# Patient Record
Sex: Female | Born: 1935
Health system: Southern US, Community
[De-identification: ages and names within clinical notes are randomized; demographics above are authoritative.]

## PROBLEM LIST (undated history)

## (undated) DIAGNOSIS — I5032 Chronic diastolic (congestive) heart failure: Secondary | ICD-10-CM

## (undated) DIAGNOSIS — K5900 Constipation, unspecified: Secondary | ICD-10-CM

## (undated) DIAGNOSIS — I2510 Atherosclerotic heart disease of native coronary artery without angina pectoris: Secondary | ICD-10-CM

## (undated) DIAGNOSIS — I509 Heart failure, unspecified: Secondary | ICD-10-CM

## (undated) DIAGNOSIS — I7121 Aneurysm of the ascending aorta, without rupture: Secondary | ICD-10-CM

## (undated) DIAGNOSIS — M1990 Unspecified osteoarthritis, unspecified site: Secondary | ICD-10-CM

## (undated) DIAGNOSIS — Z87440 Personal history of urinary (tract) infections: Secondary | ICD-10-CM

## (undated) DIAGNOSIS — R011 Cardiac murmur, unspecified: Secondary | ICD-10-CM

## (undated) DIAGNOSIS — M797 Fibromyalgia: Secondary | ICD-10-CM

## (undated) DIAGNOSIS — I10 Essential (primary) hypertension: Secondary | ICD-10-CM

## (undated) DIAGNOSIS — Z8739 Personal history of other diseases of the musculoskeletal system and connective tissue: Secondary | ICD-10-CM

## (undated) HISTORY — DX: Aneurysm of the ascending aorta, without rupture: I71.21

## (undated) HISTORY — PX: KNEE SURGERY: SHX244

## (undated) HISTORY — PX: OTHER SURGICAL HISTORY: SHX169

## (undated) HISTORY — PX: JOINT REPLACEMENT: SHX530

## (undated) HISTORY — DX: Chronic diastolic (congestive) heart failure: I50.32

## (undated) HISTORY — PX: REPLACEMENT TOTAL HIP W/  RESURFACING IMPLANTS: SUR1222

---
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
## 1961-02-08 HISTORY — PX: HERNIA REPAIR: SHX51

## 1970-02-08 HISTORY — PX: BACK SURGERY: SHX140

## 1997-06-13 ENCOUNTER — Other Ambulatory Visit: Admission: RE | Admit: 1997-06-13 | Discharge: 1997-06-13 | Payer: Self-pay | Admitting: Obstetrics and Gynecology

## 1997-11-08 ENCOUNTER — Other Ambulatory Visit: Admission: RE | Admit: 1997-11-08 | Discharge: 1997-11-08 | Payer: Self-pay | Admitting: Obstetrics and Gynecology

## 1998-04-28 ENCOUNTER — Ambulatory Visit (HOSPITAL_COMMUNITY): Admission: RE | Admit: 1998-04-28 | Discharge: 1998-04-28 | Payer: Self-pay

## 1998-05-28 ENCOUNTER — Encounter: Payer: Self-pay | Admitting: Orthopedic Surgery

## 1998-06-02 ENCOUNTER — Inpatient Hospital Stay (HOSPITAL_COMMUNITY): Admission: RE | Admit: 1998-06-02 | Discharge: 1998-06-05 | Payer: Self-pay | Admitting: Orthopedic Surgery

## 1998-06-05 ENCOUNTER — Inpatient Hospital Stay (HOSPITAL_COMMUNITY): Admission: RE | Admit: 1998-06-05 | Discharge: 1998-06-11 | Payer: Self-pay | Admitting: *Deleted

## 1998-11-10 ENCOUNTER — Other Ambulatory Visit: Admission: RE | Admit: 1998-11-10 | Discharge: 1998-11-10 | Payer: Self-pay | Admitting: Obstetrics and Gynecology

## 1999-11-10 ENCOUNTER — Other Ambulatory Visit: Admission: RE | Admit: 1999-11-10 | Discharge: 1999-11-10 | Payer: Self-pay | Admitting: Obstetrics and Gynecology

## 2000-11-18 ENCOUNTER — Other Ambulatory Visit: Admission: RE | Admit: 2000-11-18 | Discharge: 2000-11-18 | Payer: Self-pay | Admitting: Obstetrics and Gynecology

## 2001-10-17 ENCOUNTER — Encounter (INDEPENDENT_AMBULATORY_CARE_PROVIDER_SITE_OTHER): Payer: Self-pay | Admitting: Specialist

## 2001-10-17 ENCOUNTER — Ambulatory Visit (HOSPITAL_COMMUNITY): Admission: RE | Admit: 2001-10-17 | Discharge: 2001-10-17 | Payer: Self-pay | Admitting: *Deleted

## 2001-12-04 ENCOUNTER — Other Ambulatory Visit: Admission: RE | Admit: 2001-12-04 | Discharge: 2001-12-04 | Payer: Self-pay | Admitting: Obstetrics and Gynecology

## 2002-02-08 HISTORY — PX: OTHER SURGICAL HISTORY: SHX169

## 2002-04-03 ENCOUNTER — Ambulatory Visit (HOSPITAL_BASED_OUTPATIENT_CLINIC_OR_DEPARTMENT_OTHER): Admission: RE | Admit: 2002-04-03 | Discharge: 2002-04-04 | Payer: Self-pay | Admitting: Orthopedic Surgery

## 2003-09-22 ENCOUNTER — Encounter: Admission: RE | Admit: 2003-09-22 | Discharge: 2003-09-22 | Payer: Self-pay

## 2003-10-22 ENCOUNTER — Encounter: Admission: RE | Admit: 2003-10-22 | Discharge: 2003-10-22 | Payer: Self-pay

## 2003-11-05 ENCOUNTER — Encounter: Admission: RE | Admit: 2003-11-05 | Discharge: 2003-11-05 | Payer: Self-pay

## 2003-12-12 ENCOUNTER — Ambulatory Visit (HOSPITAL_COMMUNITY): Admission: RE | Admit: 2003-12-12 | Discharge: 2003-12-12 | Payer: Self-pay | Admitting: Cardiology

## 2003-12-19 ENCOUNTER — Other Ambulatory Visit: Admission: RE | Admit: 2003-12-19 | Discharge: 2003-12-19 | Payer: Self-pay | Admitting: *Deleted

## 2004-02-09 HISTORY — PX: SPINAL FUSION: SHX223

## 2004-03-23 ENCOUNTER — Ambulatory Visit: Payer: Self-pay | Admitting: Physical Medicine & Rehabilitation

## 2004-03-23 ENCOUNTER — Inpatient Hospital Stay (HOSPITAL_COMMUNITY): Admission: RE | Admit: 2004-03-23 | Discharge: 2004-03-26 | Payer: Self-pay | Admitting: Neurosurgery

## 2004-03-26 ENCOUNTER — Inpatient Hospital Stay (HOSPITAL_COMMUNITY)
Admission: RE | Admit: 2004-03-26 | Discharge: 2004-04-07 | Payer: Self-pay | Admitting: Physical Medicine & Rehabilitation

## 2004-12-23 ENCOUNTER — Other Ambulatory Visit: Admission: RE | Admit: 2004-12-23 | Discharge: 2004-12-23 | Payer: Self-pay | Admitting: Obstetrics and Gynecology

## 2005-04-20 ENCOUNTER — Encounter: Admission: RE | Admit: 2005-04-20 | Discharge: 2005-04-20 | Payer: Self-pay

## 2005-08-04 ENCOUNTER — Inpatient Hospital Stay (HOSPITAL_COMMUNITY): Admission: RE | Admit: 2005-08-04 | Discharge: 2005-08-10 | Payer: Self-pay | Admitting: Orthopedic Surgery

## 2005-08-09 ENCOUNTER — Encounter: Payer: Self-pay | Admitting: Vascular Surgery

## 2005-08-09 ENCOUNTER — Ambulatory Visit: Payer: Self-pay | Admitting: Physical Medicine & Rehabilitation

## 2005-12-24 ENCOUNTER — Other Ambulatory Visit: Admission: RE | Admit: 2005-12-24 | Discharge: 2005-12-24 | Payer: Self-pay | Admitting: Obstetrics and Gynecology

## 2006-02-01 ENCOUNTER — Ambulatory Visit (HOSPITAL_COMMUNITY): Admission: EM | Admit: 2006-02-01 | Discharge: 2006-02-01 | Payer: Self-pay | Admitting: Emergency Medicine

## 2006-02-08 HISTORY — PX: OTHER SURGICAL HISTORY: SHX169

## 2006-02-28 ENCOUNTER — Inpatient Hospital Stay (HOSPITAL_COMMUNITY): Admission: EM | Admit: 2006-02-28 | Discharge: 2006-03-01 | Payer: Self-pay | Admitting: Emergency Medicine

## 2006-06-02 ENCOUNTER — Observation Stay (HOSPITAL_COMMUNITY): Admission: EM | Admit: 2006-06-02 | Discharge: 2006-06-03 | Payer: Self-pay | Admitting: Emergency Medicine

## 2006-06-22 ENCOUNTER — Inpatient Hospital Stay (HOSPITAL_COMMUNITY): Admission: RE | Admit: 2006-06-22 | Discharge: 2006-06-25 | Payer: Self-pay | Admitting: Orthopedic Surgery

## 2007-03-08 ENCOUNTER — Other Ambulatory Visit: Admission: RE | Admit: 2007-03-08 | Discharge: 2007-03-08 | Payer: Self-pay | Admitting: Obstetrics and Gynecology

## 2007-03-15 ENCOUNTER — Emergency Department (HOSPITAL_COMMUNITY): Admission: EM | Admit: 2007-03-15 | Discharge: 2007-03-15 | Payer: Self-pay | Admitting: Emergency Medicine

## 2007-08-06 ENCOUNTER — Emergency Department (HOSPITAL_COMMUNITY): Admission: EM | Admit: 2007-08-06 | Discharge: 2007-08-06 | Payer: Self-pay | Admitting: Internal Medicine

## 2008-06-20 ENCOUNTER — Encounter (INDEPENDENT_AMBULATORY_CARE_PROVIDER_SITE_OTHER): Payer: Self-pay | Admitting: *Deleted

## 2008-06-20 ENCOUNTER — Ambulatory Visit (HOSPITAL_COMMUNITY): Admission: RE | Admit: 2008-06-20 | Discharge: 2008-06-20 | Payer: Self-pay | Admitting: *Deleted

## 2008-06-25 ENCOUNTER — Ambulatory Visit (HOSPITAL_COMMUNITY): Admission: RE | Admit: 2008-06-25 | Discharge: 2008-06-25 | Payer: Self-pay | Admitting: *Deleted

## 2008-07-11 ENCOUNTER — Emergency Department (HOSPITAL_COMMUNITY): Admission: EM | Admit: 2008-07-11 | Discharge: 2008-07-11 | Payer: Self-pay | Admitting: Emergency Medicine

## 2008-08-16 ENCOUNTER — Ambulatory Visit (HOSPITAL_COMMUNITY): Admission: RE | Admit: 2008-08-16 | Discharge: 2008-08-16 | Payer: Self-pay | Admitting: *Deleted

## 2008-08-16 ENCOUNTER — Encounter (INDEPENDENT_AMBULATORY_CARE_PROVIDER_SITE_OTHER): Payer: Self-pay | Admitting: *Deleted

## 2008-10-14 ENCOUNTER — Emergency Department (HOSPITAL_COMMUNITY): Admission: EM | Admit: 2008-10-14 | Discharge: 2008-10-14 | Payer: Self-pay | Admitting: Emergency Medicine

## 2009-11-10 ENCOUNTER — Other Ambulatory Visit: Admission: RE | Admit: 2009-11-10 | Discharge: 2009-11-10 | Payer: Self-pay | Admitting: Internal Medicine

## 2010-05-19 LAB — GLUCOSE, CAPILLARY: Glucose-Capillary: 107 mg/dL — ABNORMAL HIGH (ref 70–99)

## 2010-06-23 NOTE — Op Note (Signed)
NAME:  Karen [name], Karen [name] NO.:  [id number]   MEDICAL RECORD NO.:  [id number]          PATIENT TYPE:  AMB   LOCATION:  ENDO                         FACILITY:  [facility]   PHYSICIAN:  Waverly Ferrari, M.D.    DATE OF BIRTH:  1928-04-13   DATE OF PROCEDURE:  DATE OF DISCHARGE:                               OPERATIVE REPORT   PROCEDURE:  Upper endoscopy.   INDICATIONS:  GERD.   ANESTHESIA:  Fentanyl 100 mcg, Versed 10 mg.   PROCEDURE:  With the patient mildly sedated in the left lateral  decubitus position, the Pentax videoscopic endoscope was inserted in the  mouth and passed under direct vision through the esophagus that appeared  normal, but the distal esophagus was fairly tight and it took some  pressure to pass through this area.  We entered into the stomach and it  was erythematous.  The fundus, body, antrum, duodenal bulb and second  portion of the duodenum were visualized.  From this point, the endoscope  was slowly withdrawn taking circumferential views of the duodenal mucosa  until the endoscope had been pulled back into the stomach and placed in  retroflexion to view the stomach from below.  The endoscope was  straightened and withdrawn taking circumferential views of the remaining  gastric and esophageal mucosa stopping to take biopsies from  erythematous changes throughout the stomach.  The endoscope was  withdrawn.  The patient's vital signs and pulse oximeter remained  stable.  The patient tolerated the procedure well without apparent  complications.   FINDINGS:  Fairly tight distal esophagus.  I wonder whether this could  be incipient achalasia.  The patient has not described dysphagia.  We  will discuss this further with her when we see her as an outpatient.  Erythematous changes of the stomach biopsied.  Await biopsy report.  The  patient will call me for results and follow-up with me as an outpatient.           ______________________________  Waverly Ferrari, M.D.     GMO/MEDQ  D:  06/20/2008  T:  06/20/2008  Job:  [id number]

## 2010-06-23 NOTE — Op Note (Signed)
NAME:  Karen [name], Karen [name] NO.:  [id number]   MEDICAL RECORD NO.:  [id number]          PATIENT TYPE:  AMB   LOCATION:  ENDO                         FACILITY:  [facility]   PHYSICIAN:  Waverly Ferrari, M.D.    DATE OF BIRTH:  May 23, 1928   DATE OF PROCEDURE:  08/16/2008  DATE OF DISCHARGE:                               OPERATIVE REPORT   PROCEDURE:  Upper endoscopy with injection and biopsy.   INDICATIONS:  Dysphagia, question of achalasia with decreased relaxation  seen on esophageal manometry.   ANESTHESIA:  Fentanyl 75 mcg, Versed 5 mg.   PROCEDURE:  With the patient mildly sedated in the left lateral  decubitus position, the Pentax videoscopic endoscope was inserted,  passed under direct vision through the esophagus which appeared normal,  and in the distal esophagus at the GE junction, we elected to inject  Botox, and we did it 3 times which covered the entire circumference of  the esophagus at this level.  I decided not to use the 4th injection.  Each was 1 mL.  We then entered into the stomach.  Erythematous changes  were seen diffusely in the stomach.  Fundus, body, and antrum were  visualized as was duodenal bulb and second portion of the duodenum.  From this point, the endoscope was slowly withdrawn, taking  circumferential views of duodenal mucosa until the endoscope had been  pulled back into stomach and placed in retroflexion to view the stomach  from below.  The endoscope was then straightened and withdrawn, taking  circumferential views of the remaining gastric and esophageal mucosa,  stopping in the fundus of the stomach to take biopsies of the  erythematous changes.  The patient's vital signs and pulse oximeter  remained stable.  The patient tolerated the procedure well without  apparent complication.   FINDINGS:  Erythematous changes consistent with gastritis involving the  stomach, biopsied, and injection of Botox into the distal esophagus.  Await  clinical results and biopsy report.  The patient will call me for  results and follow-up with me as needed as an outpatient.           ______________________________  Waverly Ferrari, M.D.     GMO/MEDQ  D:  08/16/2008  T:  08/16/2008  Job:  [id number]

## 2010-06-23 NOTE — Op Note (Signed)
NAME:  Karen [name], Karen [name] NO.:  [id number]   MEDICAL RECORD NO.:  [id number]          PATIENT TYPE:  AMB   LOCATION:  ENDO                         FACILITY:  [facility]   PHYSICIAN:  Waverly Ferrari, M.D.    DATE OF BIRTH:  February 04, 1929   DATE OF PROCEDURE:  DATE OF DISCHARGE:                               OPERATIVE REPORT   PROCEDURE:  Colonoscopy.   INDICATIONS:  Colon cancer screening.   ANESTHESIA:  Fentanyl 50 mcg, Versed 5 mg.   PROCEDURE:  With the patient mildly sedated in the left lateral  decubitus position, the Pentax videoscopic pediatric colonoscope was  inserted in the rectum and passed under direct vision through a tortuous  diverticular filled sigmoid colon.  With pressure applied, we reached  the cecum, identified by ileocecal valve and base of cecum, both of  which were photographed.  From this point, the colonoscope was slowly  withdrawn taking circumferential views of the colonic mucosa stopping  only in the rectum which appeared normal on direct and showed  hemorrhoids on retroflexed view.  The endoscope was straightened and  withdrawn.  The patient's vital signs and pulse oximeter remained  stable.  The patient tolerated the procedure well without apparent  complications.   FINDINGS:  Hemorrhoids and significant diverticulosis of the sigmoid  colon, otherwise an unremarkable exam.   PLAN:  See endoscopy note for further follow-up plans.  Repeat  colonoscopy in 5-10 years.           ______________________________  Waverly Ferrari, M.D.     GMO/MEDQ  D:  06/20/2008  T:  06/20/2008  Job:  [id number]

## 2010-06-26 NOTE — Discharge Summary (Signed)
NAME:  CALLIA, DIBLER NO.:  [id number]   MEDICAL RECORD NO.:  [id number]          PATIENT TYPE:  INP   LOCATION:  3008                         FACILITY:  [place]   PHYSICIAN:  Ophelia Charter, M.D.DATE OF BIRTH:  08/27/1928   DATE OF ADMISSION:  03/23/2004  DATE OF DISCHARGE:  03/26/2004                                 DISCHARGE SUMMARY   BRIEF HISTORY:  The patient is a 76 year old white female who has suffered  from severe back and leg pain consistent with neurogenic claudication.  She  failed medical management and was worked up with a lumbar MRI which  demonstrated that the patient had multilevel spinal stenosis at L2-3, L3-4,  L4-5, and to a lesser extent L5-S1, as well as a grade 1-2 spondylolisthesis  at L4-5.  I discussed the various treatment options with the patient,  including surgery.  The patient and her family have weighed the risks,  benefits, and alternatives to surgery an decided to proceed with an L4-5  fusion with a multilevel decompressive laminectomy.   For further details of this admission, please refer to the typed history and  physical.   HOSPITAL COURSE:  I admitted the patient to [facility] on March 23, 2004.  On the day of admission, I performed an L4-5 fusion with  laminectomy from L2-L5.  The surgery went well.  (For full details of the  operation, please refer to the typed operative note.)   Postoperative course:  The patient's postoperative course was remarkable  only for some hypokalemia.  Her potassium was 2.8.  We gave her potassium.   The remainder of the patient's postoperative course was unremarkable.  By  March 25, 2004, she was afebrile, vital signs stable.  She was eating  well, ambulating adequately, and felt to be stable for transfer to the  inpatient rehab unit, and was transferred to the inpatient rehab unit on  March 26, 2004.   DISCHARGE INSTRUCTIONS:  The patient was instructed to follow up  with me in  four weeks.   FINAL DIAGNOSIS:  L4-5 grade 1-2 acquired spondylolisthesis, degenerative  disc disease, spinal stenosis at L2-3, L3-4, L4-5, and L5-S1.   PROCEDURE PERFORMED:  L4 Gill procedure; L3 laminectomy; bilateral L2 and L5  laminotomies (to treat spinal stenosis); L4-5 posterior lumbar interbody  fusion; insertion of bilateral L4-5 interbody prostheses (Capstone PEEK  cages); L4-5 posterior nonsegmental instrumentation with Legacy titanium  pedicle screws and rods; posterolateral arthrodesis L4-5 for local  morselized autograft bone and active fused bone graft extender.      JDJ/MEDQ  D:  05/14/2004  T:  05/15/2004  Job:  [id number]

## 2010-06-26 NOTE — H&P (Signed)
NAME:  Karen [name], Karen [name]             ACCOUNT NO.:  [id number]   MEDICAL RECORD NO.:  [id number]          PATIENT TYPE:  INP   LOCATION:  1536                         FACILITY:  [facility]   PHYSICIAN:  Alexzandrew L. Perkins, P.A.C.DATE OF BIRTH:  21-Oct-1928   DATE OF ADMISSION:  06/22/2006  DATE OF DISCHARGE:                              HISTORY & PHYSICAL   CHIEF COMPLAINT:  Recurrent dislocations of the left total hip.   HISTORY OF PRESENT ILLNESS:  The patient is a 77 year old female who has  had a previous left total hip.  Unfortunately she has undergone multiple  dislocations requiring closed reductions in the past.  It is felt she  has some instability and due to the recurrent dislocations it is felt  she is best served by undergoing a revision of the left hip with  possible change of the acetabular component versus constraint liner.  The risks and benefits has been discussed and she has elected to proceed  with surgery.   ALLERGIES:  Ultram causes itching.   INTOLERANCES:  Morphine and codeine makes her sick to the point of  vomiting.   PAST MEDICAL HISTORY:  1. Hypertension.  2. History of constipation with recurrent impactions.  3. Fibromyalgia.  4. History of urinary tract infections.  5. History of urinary retention following surgery.  6. Diet-controlled type 2 diabetes mellitus.  7. History of gout.  8. History of fibromyalgia.   PAST SURGICAL HISTORY:  Left total hip replacement in 2007.  She has  undergone three dislocations requiring three closed reductions, right  foot surgery times two, left total knee replacement in 2000, right total  knee replacement in 1997, spine surgery in 1972, salivary gland stone  extraction of common duct obstruction surgery, appendix surgery,  gallbladder surgery, and hernia surgery.   SOCIAL HISTORY:  The patient is widowed, nonsmoker, no alcohol.  The  patient has two sons.  She currently lives with one of her sons who will  provide care after surgery.   FAMILY HISTORY:  Father deceased at age 75 with a history of arthritis.  Mother deceased at 90 with a history of ovarian cancer.  She had a  brother deceased at age 49 with heart disease and a heart attack,  another brother deceased age 20 with Cryoglobulinemia, another sister,  age 63, with a history of dementia.   REVIEW OF SYSTEMS:  GENERAL:  No fevers, chills, night sweats.  NEUROLOGICAL :  No seizures, syncope, or paralysis.  RESPIRATORY:  No  shortness of breath, productive cough, or hemoptysis.  CARDIOVASCULAR:  No chest pain, angina, or orthopnea.  GI:  No nausea, vomiting, or  diarrhea.  She does have chronic constipation with history of  impactions.  GU: No dysuria, hematuria, or discharge.  MUSCULOSKELETAL:  Left hip.   PHYSICAL EXAMINATION:  VITAL SIGNS:  Pulse 88, respirations 14, blood  pressure 148/78.  GENERAL:  The patient is a 77 year old white female, well-nourished,  well-developed, in no acute distress.  She is alert and oriented and  cooperative but is an excellent historian.  HEENT: Normocephalic, atraumatic.  Pupils are round and reactive.  Oropharynx clear.  EOMs intact.  NECK:  Supple.  CHEST:  Clear.  HEART:  Regular rate and rhythm.  No murmur.  ABDOMEN:  The abdomen is soft, round, slightly protuberant abdomen.  Bowel sounds ae present.  RECTOVAGINAL:  Not done.  EXTREMITIES:  Left hip:  The left hip shows flexion of 90 degrees, zero  internal and external rotation confined by her hip abduction brace.  Motor function is intact.   CURRENT MEDICATIONS:  1. Maxzide.  2. Tiazac.  3. K-Dur.  4. Allopurinol.  5. Calcitriol.  6. Zocor.  7. Amitriptyline.  8. Calcium plus D.  9. Centrum multivitamin.  10.Ocuvite.  11.Glucosamine chondroitin with MSN.  12.Baby aspirin.  13.Mobic.   IMPRESSION:  1. Recurrent dislocations, left total hip.  2. Hypertension.  3. Chronic constipation with recurrent impactions.  4.  History of urinary tract infections.  5. History of urinary retention following surgery.  6. Diet controlled type 2 diabetes mellitus.  7. History of gout.  8. History of fibromyalgia.   PLAN:  The patient will be admitted to [facility] to undergo  revision of the left hip for recurrent dislocations and instability.  Surgery will be performed by Dr. Gaynelle Arabian.  Her medical physician,  Dr. Minna Antis will be notified of the room number and admission and will be  consulted if needed for medical assistance with the patient throughout  the hospital course.      Alexzandrew L. Perkins, P.A.C.     ALP/MEDQ  D:  06/23/2006  T:  06/23/2006  Job:  [id number]   cc:   Alexzandrew L. Perkins, P.A.C.   Tommy Medal, M.D.  Fax: [phone]   Gaynelle Arabian, M.D.  Fax: [phone]

## 2010-06-26 NOTE — Op Note (Signed)
   NAME:  Karen [name], Karen [name]                       ACCOUNT NO.:  [id number]   MEDICAL RECORD NO.:  [id number]                   PATIENT TYPE:  AMB   LOCATION:  ENDO                                 FACILITY:  [place]   PHYSICIAN:  Waverly Ferrari, M.D.                 DATE OF BIRTH:  1929-01-17   DATE OF PROCEDURE:  10/17/2001  DATE OF DISCHARGE:                                 OPERATIVE REPORT   PROCEDURE:  Colonoscopy.   INDICATIONS:  Colon cancer screening.  Followup the patient's acute  diarrheal components with blood per rectum.   ANESTHESIA:  None further given.   PROCEDURE:  With the patient mildly sedated in the left lateral decubitus  position, subsequently rolled to her back and then back to the left, the  Olympus videoscopic colonoscope was passed through a tortuous sigmoid colon  to the cecum identified the ileocecal valve and appendiceal orifice, both of  which were photographed.  From this point, the colonoscope was slowly  withdrawn taking circumflex views of the entire colonic mucosa stopping only  in the rectum which appeared normal on retroflex view as well.  The  endoscope was straightened and withdrawn.  The patient vital signs and pulse  oximeter remained stable.  The patient tolerated the procedure well without  apparent complications.   FINDINGS:  Moderately severe diverticulosis of the sigmoid colon, otherwise  unremarkable examination.   PLAN:  Repeat examination in five to ten years.                                                Waverly Ferrari, M.D.    GMO/MEDQ  D:  10/17/2001  T:  10/18/2001  Job:  [id number]

## 2010-06-26 NOTE — Op Note (Signed)
NAME:  Karen [name], Karen [name] NO.:  [id number]   MEDICAL RECORD NO.:  [id number]          PATIENT TYPE:  EMS   LOCATION:  MAJO                         FACILITY:  [place]   PHYSICIAN:  Gaynelle Arabian, M.D.    DATE OF BIRTH:  03-01-1928   DATE OF PROCEDURE:  02/01/2006  DATE OF DISCHARGE:                               OPERATIVE REPORT   PREOPERATIVE DIAGNOSIS:  Dislocated left total hip.   POSTOPERATIVE DIAGNOSIS:  Dislocated left total hip.   PROCEDURE:  Left hip closed reduction.   SURGEON:  Gaynelle Arabian, M.D.   ANESTHESIA:  General.   COMPLICATIONS:  None.   CONDITION.:  Stable to the recovery room.   BRIEF CLINICAL NOTE:  Ms. Arceo is a 77 year old female who had a  total hip arthroplasty done six months ago.  She has been doing  extremely well, but this morning was moving a Christmas present into a  corner in a very tight space and hyperflexed and abducted her hip while  internally rotating essentially sustaining a closed posterior  dislocation.  She was taken to the emergency room and was  neurovascularly intact.  X-ray showed posterior superior dislocation.  She comes to the operating room for closed reduction.   PROCEDURE IN DETAIL:  After successful administration of general  anesthetic, I put the hip in a 90/90 position and with counter traction  against the pelvis, pulled up and reduced the hip.  I then placed it  through a range of motion.  She had full extension, flexion, rotation  and at 90 degrees of flexion, could rotate about 40 degrees in each  direction without any instability.  AP pelvis x-rays taken confirmed  concentric reduction.  She was placed into a knee immobilizer, awakened,  and transferred to recovery in stable condition.      Gaynelle Arabian, M.D.  Electronically Signed     FA/MEDQ  D:  02/01/2006  T:  02/01/2006  Job:  [id number]

## 2010-06-26 NOTE — Op Note (Signed)
NAME:  Karen [name], Karen [name] NO.:  [id number]   MEDICAL RECORD NO.:  [id number]          PATIENT TYPE:  OBV   LOCATION:  [id number]                         FACILITY:  [facility]   PHYSICIAN:  Susa Day, M.D.    DATE OF BIRTH:  03-29-1928   DATE OF PROCEDURE:  06/02/2006  DATE OF DISCHARGE:  06/03/2006                               OPERATIVE REPORT   PREOPERATIVE DIAGNOSIS:  Dislocated left total hip arthroplasty.   POSTOPERATIVE DIAGNOSIS:  Dislocated left total hip arthroplasty.   PROCEDURE:  1. Closed reduction under anesthesia.  2. Fluoroscopic evaluation postreduction.   ANESTHESIA:  General   ASSISTANT:  None.   BRIEF HISTORY AND INDICATIONS:  This is a 77 year old patient of Dr.  Aluisio's with a history of a total hip arthroplasty in the past and  subsequent dislocation.  She reported at home bending over and  redislocating her left hip.  This was confirmed by radiographic  evaluation in the emergency room.  It showed no evidence of a fracture  or complement disassociation; she was neurovascular intact.  She was,  therefore, indicated for closed reduction under anesthesia.  Risks and  benefits discussed including redislocation, component disassociation,  need for open procedure, etcetera.   TECHNIQUE:  Patient placed in the supine position.  After the induction  of general anesthesia, was placed into longitudinal traction with the  hip flexed.  On gentle internal rotation maneuver, I was able to  appreciate the relocation of the prosthesis.  Following the relocation  we had full extension.  Restoration of the leg lengths was noted.  She  was stable with 90 degrees of flexion with 20 degrees of internal  rotation and extension.  She was stable in internal rotation as well as  external rotation.  Under fluoroscopic evaluation the patient was noted  to be concentrically reduced in the AP and frog lateral.  There was no  evidence of the disassociation of the  components or fracture.   The patient was placed in a knee immobilizer, extubated without  difficulty, and transported to the recovery room in satisfactory  condition.  The patient will proceed; there were no complications.      Susa Day, M.D.  Electronically Signed     JB/MEDQ  D:  07/27/2006  T:  07/27/2006  Job:  [id number]

## 2010-06-26 NOTE — H&P (Signed)
NAME:  Karen [name], Karen [name] NO.:  [id number]   MEDICAL RECORD NO.:  [id number]          PATIENT TYPE:  INP   LOCATION:  1536                         [facility]ACILITY:  [facility]   PHYSICIAN:  Gaynelle Arabian, M.D.    DATE OF BIRTH:  1928/08/18   DATE OF ADMISSION:  06/22/2006  DATE OF DISCHARGE:                              HISTORY & PHYSICAL   CHIEF COMPLAINT:  Recurrent dislocations of the left total hip.   HISTORY OF PRESENT ILLNESS:  The patient is a 77 year old female who has  had a previous left total hip.  Unfortunately she has undergone multiple  dislocations requiring closed reductions in the past.  It is felt she  has some instability and due to the recurrent dislocations it is felt  she is best served by undergoing a revision of the left hip with  possible change of the acetabular component versus constraint liner.  The risks and benefits has been discussed and she has elected to proceed  with surgery.   ALLERGIES:  Ultram causes itching.   INTOLERANCES:  Morphine and codeine makes her sick to the point of  vomiting.   PAST MEDICAL HISTORY:  1. Hypertension.  2. History of constipation with recurrent impactions.  3. Fibromyalgia.  4. History of urinary tract infections.  5. History of urinary retention following surgery.  6. Diet-controlled type 2 diabetes mellitus.  7. History of gout.  8. History of fibromyalgia.   PAST SURGICAL HISTORY:  Left total hip replacement in 2007.  She has  undergone three dislocations requiring three closed reductions, right  foot surgery times two, left total knee replacement in 2000, right total  knee replacement in 1997, spine surgery in 1972, salivary gland stone  extraction of common duct obstruction surgery, appendix surgery,  gallbladder surgery, and hernia surgery.   SOCIAL HISTORY:  The patient is widowed, nonsmoker, no alcohol.  The  patient has two sons.  She currently lives with one of her sons who will  provide care  after surgery.   FAMILY HISTORY:  Father deceased at age 68 with a history of arthritis.  Mother deceased at 32 with a history of ovarian cancer.  She had a  brother deceased at age 42 with heart disease and a heart attack,  another brother deceased age 25 with Cryoglobulinemia, another sister,  age 84, with a history of dementia.   REVIEW OF SYSTEMS:  GENERAL:  No fevers, chills, night sweats.  NEUROLOGICAL :  No seizures, syncope, or paralysis.  RESPIRATORY:  No  shortness of breath, productive cough, or hemoptysis.  CARDIOVASCULAR:  No chest pain, angina, or orthopnea.  GI:  No nausea, vomiting, or  diarrhea.  She does have chronic constipation with history of  impactions.  GU: No dysuria, hematuria, or discharge.  MUSCULOSKELETAL:  Left hip.   PHYSICAL EXAMINATION:  VITAL SIGNS:  Pulse 88, respirations 14, blood  pressure 148/78.  GENERAL:  The patient is a 77 year old white female, well-nourished,  well-developed, in no acute distress.  She is alert and oriented and  cooperative but is an excellent historian.  HEENT: Normocephalic, atraumatic.  Pupils  are round and reactive.  Oropharynx clear.  EOMs intact.  NECK:  Supple.  CHEST:  Clear.  HEART:  Regular rate and rhythm.  No murmur.  ABDOMEN:  The abdomen is soft, round, slightly protuberant abdomen.  Bowel sounds ae present.  RECTOVAGINAL:  Not done.  EXTREMITIES:  Left hip:  The left hip shows flexion of 90 degrees, zero  internal and external rotation confined by her hip abduction brace.  Motor function is intact.   CURRENT MEDICATIONS:  1. Maxzide.  2. Tiazac.  3. K-Dur.  4. Allopurinol.  5. Calcitriol.  6. Zocor.  7. Amitriptyline.  8. Calcium plus D.  9. Centrum multivitamin.  10.Ocuvite.  11.Glucosamine chondroitin with MSN.  12.Baby aspirin.  13.Mobic.   IMPRESSION:  1. Recurrent dislocations, left total hip.  2. Hypertension.  3. Chronic constipation with recurrent impactions.  4. History of urinary  tract infections.  5. History of urinary retention following surgery.  6. Diet controlled type 2 diabetes mellitus.  7. History of gout.  8. History of fibromyalgia.   PLAN:  The patient will be admitted to [facility] to undergo  revision of the left hip for recurrent dislocations and instability.  Surgery will be performed by Dr. Gaynelle Arabian.  Her medical physician,  Dr. Minna Antis will be notified of the room number and admission and will be  consulted if needed for medical assistance with the patient throughout  the hospital course.      Alexzandrew L. Perkins, P.A.C.      Gaynelle Arabian, M.D.  Electronically Signed    ALP/MEDQ  D:  06/23/2006  T:  06/23/2006  Job:  [id number]   cc:   Alexzandrew L. Perkins, P.A.C.   Tommy Medal, M.D.  Fax: [phone]   Gaynelle Arabian, M.D.  Fax: [phone]

## 2010-06-26 NOTE — Op Note (Signed)
NAME:  Karen [name], Karen [name]                       ACCOUNT NO.:  [id number]   MEDICAL RECORD NO.:  [id number]                   PATIENT TYPE:  AMB   LOCATION:  [place]                                  FACILITY:  [facility]   PHYSICIAN:  Weber Cooks, M.D.                  DATE OF BIRTH:  [birth year]   DATE OF PROCEDURE:  04/03/2002  DATE OF DISCHARGE:                                 OPERATIVE REPORT   PREOPERATIVE DIAGNOSES:  1. Right hallux valgus.  2. Right hallux rigidus.  3. Right tight Achilles tendon.  4. Right second through fifth hammer toes.   POSTOPERATIVE DIAGNOSES:  1. Right hallux valgus.  2. Right hallux rigidus.  3. Right tight Achilles tendon.  4. Right second through fifth hammer toes.   OPERATION:  1. Right chevron bunionectomy.  2. Right great toe cheilectomy.  3. Right percutaneous tendo Achilles lengthening.  4. Right second through fifth toes dorsal MTP joint capsulotomy with     collateral release.  5. Right second through fifth toes proximal phalanx head resections.  6. Right second through fifth toes EDB to EDL transfers.  7. Right second through fourth toes FDL to proximal phalanx transfer.   ANESTHESIA:  General endotracheal tube.   SURGEON:  Weber Cooks, M.D.   ASSISTANT:  Gerri Lins, P.A.-C.   ESTIMATED BLOOD LOSS:  Minimal.   TOURNIQUET TIME:  90 minutes.   COMPLICATIONS:  None.   DISPOSITION:  Stable to the PAR.   INDICATIONS FOR PROCEDURE:  This is a 77 year old female who has had  longstanding right forefoot pain that has been interfering with her life  before and she cannot do what she wants to do.  She was consented for the  above procedure.  All risks which include infection, neurovascular injury,  nonunion, malunion, hardware location or failure, recurrence of deformity,  ischemia of the toe with amputation, and AVN of the metatarsal head with  possible stiffness and arthritis were all explained.  Questions were  encouraged and  answered.   DESCRIPTION OF PROCEDURE:  The patient was brought to the operating room,  placed in the supine position after adequate general endotracheal tube  anesthesia was administered as well as Ancef 1 gram IV piggyback.  The right  lower extremity had been prepped and draped in a sterile manner with a  proximally placed thigh tourniquet.  Once the right lower extremity had been  prepped and draped in a sterile manner we started the procedure with two  medial and one lateral hemisections of the Achilles tendon.  This had an  excellent release of the tight Achilles tendon.  We then gravity  exsanguinated the right lower extremity and the tourniquet was elevated to  290 mmHg.  We made a longitudinal incision over the medial aspect midline of  the right great toe MTP joint.  Dissection was carried down to the capsule.  Neurovascular structures were  protected both superiorly and inferiorly.  L  shaped capsulotomy was then made.  A simple bunionectomy was then performed  with a sagittal saw.  Then with a curved Beaver blade the lateral capsule  was then released.  We then found the center of the metatarsal head and a  chevron osteotomy was then made with a sagittal saw.  The head was then  translated approximately 2-3 mm laterally and then fixed with a 2.0 mm, 14  mm long fully-threaded cortical set screw using a 1.5 mm drill respectively.  This maintained the correction beautifully.  We then removed the remaining  portion of the prominent bone medially and Johnson's ridge was then  rongeured as well.  X-rays were obtained of AP and lateral planes and showed  excellent alignment as well as reduction and placement of fixation.  The  capsule was then advanced proximally superiorly and sewed with 2-0 Vicryl.  The wound was copiously irrigated with normal saline as well.   We then made a longitudinal incision over the second toe.  Dissection was  carried down to the extensor tendons.  Extensor  digitorum longus was  tenotomized proximal-medially and brevis distal-laterally and retracted out  of harm's way for later transfer.  The PIP joint was then entered and  skeletonized distally and the head was then removed with the aid of the  rongeur and a bone cutter.  We then entered the MTP joint dorsally and not  only did a dorsal capsulotomy but also released both the medial and lateral  capsules respectively.  Then through the PIP joint plantar plate, an  longitudinal incision was then made, FDL tendon was identified and pulled  through the wound and tenotomized distal as well as proximal.  A 3.5 mm  drill hole was then placed through the base of the proximal phalanx angling  distal plantar.  Then, with a 3.0 PDS suture the FDL tendon was then  transferred through the drill hole in the proximal phalanx.  We then placed  a 4/5 double-ended trocar K-wire antegrade through the middle and distal  phalanx and then retrograde through the proximal phalanx and with tension  placed on the FDL tendon and the ankle neutral dorsiflexion, the K-wire was  then advanced across the MTP joint with the toe held reduced.  Once this was  done we then transferred the EDB to EL transfer as well as sewed this to the  FDL dorsally with the 3-0 PDS.  The wound was copiously irrigated with  normal saline.  We did the same exact procedure for the third and fourth  toes through a dorsal incision as well.  For the fifth toe the same  procedure was done except that there was no FDL to proximal phalanx  transfer.  Wounds were all copiously irrigated with normal saline.  Tourniquet was deflated at the end of the procedure.  Toes pinked up  beautifully and bled at the tip of the toe as well.  Skin relieving  incisions were placed around each individual K-wire respectively.  The K-  wires were bent, cut, and capped.  All wounds were closed with 4-0 nylon suture.  Sterile dressing was applied.  A CAM Walker boot was  applied.  The  patient was stable to the PAR.  Weber Cooks, M.D.    PB/MEDQ  D:  04/03/2002  T:  04/03/2002  Job:  [id number]

## 2010-06-26 NOTE — Op Note (Signed)
   NAME:  [name], Karen [name]                       ACCOUNT NO.:  [id number]   MEDICAL RECORD NO.:  [id number]                   PATIENT TYPE:  AMB   LOCATION:  ENDO                                 FACILITY:  [facility]   PHYSICIAN:  Waverly Ferrari, M.D.                 DATE OF BIRTH:  04-Jul-1928   DATE OF PROCEDURE:  10/17/2001  DATE OF DISCHARGE:                                 OPERATIVE REPORT   PROCEDURE:  Upper endoscopy.   INDICATIONS:  GERD.   ANESTHESIA:  Demerol 100 and Versed 10 mg.   PROCEDURE:  With the patient mildly sedated, in the left lateral decubitus  position, the Olympus videoscopic endoscope was inserted into the mouth and  passed under direct vision through the esophagus which appeared normal into  the stomach.  The fundus showed changes of gastritis which were photographed  and biopsied.  The body, antrum, duodenal bulb and second portion of the  duodenum appeared normal.  From this point the endoscope was slowly  withdrawn taking circumflex views of the entire duodenal mucosa until the  endoscope pulled back into the stomach and placed in retroflex and viewed  the stomach from below.  The endoscope was then straightened and withdrawn  taking circumflex views of the remaining gastric and esophageal mucosa.  As  mentioned above in the fundus, the biopsy changes of snake-skinning and  presumed gastritis.  The patient's vital signs and pulse oximetry remained  stable.  The patient tolerated the procedure well without apparent  complications.   FINDINGS:  Changes as mentioned above in the fundus of the stomach, await  biopsy report.   The patient will call me of results and followup with me as an outpatient.  Proceed with colonoscopy as planned.                                                Waverly Ferrari, M.D.    GMO/MEDQ  D:  10/17/2001  T:  10/18/2001  Job:  [id number]

## 2010-06-26 NOTE — Op Note (Signed)
NAME:  Karen [name], Karen [name] NO.:  [id number]   MEDICAL RECORD NO.:  [id number]          PATIENT TYPE:  INP   LOCATION:  0006                         FACILITY:  [facility]   PHYSICIAN:  Gaynelle Arabian, M.D.    DATE OF BIRTH:  [birth year]   DATE OF PROCEDURE:  08/04/2005  DATE OF DISCHARGE:                                 OPERATIVE REPORT   PREOPERATIVE DIAGNOSIS:  Osteoarthritis left hip.   POSTOPERATIVE DIAGNOSIS:  Osteoarthritis left hip.   PROCEDURE:  Left total hip arthroplasty.   SURGEON:  Gaynelle Arabian, M.D.   ASSISTANT:  Alexzandrew L. Perkins, P.A.-C.   ANESTHESIA:  General.   ESTIMATED BLOOD LOSS:  300 mL.   DRAINS:  Hemovac x1.   COMPLICATIONS:  None.   CONDITION:  Stable to the recovery room.   CLINICAL NOTE:  Karen [name] is a 77 year old female with end stage  osteoarthritis of the left hip with intractable pain.  She presents now for  left total hip arthroplasty.   PROCEDURE IN DETAIL:  After the successful administration of general  anesthetic, the patient was placed in the right lateral decubitus position  with the left side up and held with the hip positioner.  The left lower  extremity was isolated from the perineum with plastic drapes and prepped and  draped in the usual sterile fashion.  A standard mini-posterolateral  incision is made with a 10 blade through subcutaneous tissue to the level of  the fascia lata which was incised in line with the skin incision.  The  sciatic nerve is palpated and protected and the short rotators is isolated  off the femur.  Capsulectomy was performed and the hip was dislocated.  The  center of the femoral head is marked and trial prosthesis is placed such  that the center of the trial head corresponds to the center of her native  femoral head.  Osteotomy alignment is marked on the femoral neck and  osteotomy made with an oscillating saw.  The femur is then retracted  anteriorly to gain acetabular  exposure.   Acetabular reaming starts at 47 mm coursing in increments up to 51 mm and  then a 52 mm Pinnacle acetabular shell was placed in anatomic position and  transfixed with two dome screws.  The trial 32 mm neutral +4 liner was  placed.   The femur was prepared with the canal finder and irrigation.  Axial reaming  is performed to 11.5 mm, proximal reaming to a 16D the sleeve machined to a  small.  A 16D small trial sleeve is placed with a 16 x 11 stem and a 36 +6  neck matching her native anteversion.  32 +0 head trial was placed and the  hip was reduced with great stability, full extension, full external  rotation, 70 degrees of flexion, 40 degrees of adduction, 90 degrees of  internal rotation, 90 degrees of flexion, and 70 degrees of internal  rotation.  By placing the left leg on top of the right, it felt as though  leg lengths were equal.  The hip was then dislocated and all  trials removed.  The permanent apex hole eliminator is placed in the acetabular shell and the  permanent 32 mm neutral +4 four Marathon liner was placed.  On the femoral  side, the 16D small sleeve is placed and 16 x 11 stem and 36 +6 neck  matching her native anteversion.  The 32 +0 head is placed and the hip  reduced with the same stability parameters.  The wound was copiously  irrigated with saline solution and the short rotators reattached to the  femur through drill holes.  The fascia lata was closed over a Hemovac drain  with interrupted #1 Vicryl, subcu closed with #1 and 2-0 Vicryl,  subcuticular running 4-0 Monocryl.  The incision was cleaned and dried and a  bulky sterile dressing applied.  She is then placed into a knee immobilizer,  awakened, and transferred to recovery in stable condition.      Gaynelle Arabian, M.D.  Electronically Signed     FA/MEDQ  D:  08/04/2005  T:  08/04/2005  Job:  [id number]

## 2010-06-26 NOTE — Discharge Summary (Signed)
NAME:  Karen [name], Karen [name] NO.:  [id number]   MEDICAL RECORD NO.:  [id number]          PATIENT TYPE:  IPS   LOCATION:  4007                         FACILITY:  [facility]   PHYSICIAN:  Jarvis Morgan, M.D.   DATE OF BIRTH:  04-11-1928   DATE OF ADMISSION:  03/26/2004  DATE OF DISCHARGE:  04/07/2004                                 DISCHARGE SUMMARY   DISCHARGE DIAGNOSES:  1.  Lumbar stenosis L4-L5 with spondylolisthesis L4-L5 requiring L4 Gill      procedure with L4-L5 laminotomies.  2.  Postoperative pain much improved.  3.  Hypertension.  4.  Constipation.  5.  Hypokalemia, resolved.   HISTORY OF PRESENT ILLNESS:  Karen [name] is a 77 year old female with  history of hypertension, fibromyalgia, low back pain, bilateral hip pain  especially with ambulation.  Work up revealed L4-L5 spondylolisthesis,  moderate stenosis L2 to L4, severe stenosis L4 to L5.  Patient elected to  undergo L4 Gill procedure with L3 to L5 laminotomies with L1-L5 fusion by  Dr. Arnoldo Morale on March 23, 2004.  Postoperatively, the patient had problems  with numbness left first and second fingers also with complaints of pain  control issues, severe.  Therapies were initiated and the patient was noted  to be limited by pain despite Demerol being used for pain control.  Currently she is +2 total assist 40% transfers, __________ to total assist  60% to ambulate 20 feet with rolling walker, requiring set up to mod assist  for ADLs.  Rehab was consulted for progressive therapies.   PAST MEDICAL HISTORY:  Significant for diverticulosis, right second to fifth  toe reconstructions with Achilles lengthening, cholecystectomy, bilateral  total knee replacements, fibromyalgia, osteoporosis, gout, cervical  laminectomy, dyslipidemia and history of hyperglycemia secondary to  steroid  use.   ALLERGIES:  MORPHINE, CODEINE, ULTRAM.   FAMILY HISTORY:  Positive for cancer.   SOCIAL HISTORY:  Patient lives with  son in one level home with four to five  steps to entry.  Son has house work and cooking.  Patient was independent  but sedentary and has been sedentary since June 2005.  Does not use any  tobacco or alcohol.   HOSPITAL COURSE:  Karen [name] was admitted to rehabilitation on  March 26, 2004 for inpatient therapies to consist of [facility] Time and  occupational therapy daily.  Past admission the patient was maintained on  subcutaneous Lovenox for deep venous thrombosis prophylaxis.  She was noted  to have quite a bit of discomfort with pain with any movement.  This pain  did resolve in a minute or two once position changed.  She was started on  OxyContin CR as well as Celebrex and Flexeril at admission to help with pain  control.  The patient's mobility initially was limited secondary to pain.  _________ was added to help with some neuropathic issues, however, the  patient was unable to tolerate this with excessive sedation being the side  effect.  As the patient's OxyContin was increased to 40 mg b.i.d., she was  able to receive better pain control.  Also of  note during this stay has been  problems with urinary retention.  She required in and out catheters with  high volumes at 400 to 500 cc.  She was started on Flomax as well as low  dose Urecholine but had no urge or any episodes of voiding on this.  She was  also noted to have fevers.  Back incision was checked and was noted to be  healing well without any signs or symptoms of infection.  Chest x-rays were  done showing no acute disease.  A CBC on April 03, 2004 showed patient's  hemoglobin at 10.7, hematocrit 30.1, white count was noted to be elevated at  15.9, platelet count 267,000.  Blood cultures x2 were done.  The patient had  been on Amoxicillin for ? of urinary tract infection as patient with  positive urine.  The patient's urine culture grew out initially Escherichia  coli then Escherichia coli and Proteus.  The patient  was changed over to  [place] on April 03, 2004.  Final cultures showed Escherichia coli to be  resistant to Amoxicillin which was the culprit for causing fevers.  Once the  patient was initiated on Cipro she defervesced nicely.  She has been  afebrile over the last four days.  Blood pressures have been reasonably  controlled.  CBGs were monitored on AC and HS basis on carbohydrate modified  diet and have run from 100 to 160's range.   Secondary to the patient's issues with urinary retention, Dr. Kathie Rhodes  was consulted for input.  He recommended placing indwelling Foley catheter  and discontinuing Flomax and Urecholine.  He would follow up with the  patient in approximately one week past discharge for another voiding trial.  At the time of discharge the patient continues on Cipro to complete 7 total  days of antibiotic therapy.  She continues with her corset and routine back  precautions.  The pain is well controlled currently.  At the time of  discharge ,the patient is independent for bed mobility, modified independent  for transfers, modified independent for ambulating 250 feet with rolling  walker, requires supervision to navigate 12 steps.  She is modified  independent for ADLs including toileting.  She is at supervision for kitchen  management tasks.  Further follow up therapies to include home health, PT,  OT, and by [place], home health R.N. has been arranged to monitor  wound.  On April 07, 2004 the patient is discharged to home.   DISCHARGE MEDICATIONS:  1.  OxyContin CR 20 mg two p.o. b.i.d. x2 weeks, then one p.o. b.i.d. x2      weeks, then one p.o. per day until gone.  2.  Paxil 5 mg p.o. b.i.d.  3.  Zocor 20 mg p.o. q.h.s.  4.  Celebrex 100 mg p.o. b.i.d.  5.  Triamterene/hydrochlorothiazide 25/50 per day.  6.  Allopurinol 100 mg daily.  7.  MiraLax 17 grams in 8 ounces fluid per day.  8.  Dulcolax tablets two p.o. q.h.s. 9.  Cipro 500 mg b.i.d.  10.  Dulcolax suppository every day p.r.n.   DIET:  Diet is carb modified __________   ACTIVITY:  As tolerated.  Use of a corset when out of bed and use a walker.   SPECIAL DISCHARGE INSTRUCTIONS:  Progressive physical therapy and  occupational therapy to continue by [place].  Continue routine  back precautions.  Use walker for walking.   FOLLOW UP:  Patient is to follow  up with Dr. Earle Gell for postoperative  check.  Follow up with Dr. Jonette [facility].  Follow up  with LMD for other care.      PP/MEDQ  D:  04/07/2004  T:  04/07/2004  Job:  [id number]   cc:   Tommy Medal, M.D.  [place], [place] [address]  Fax: [phone]   Ophelia Charter, M.D.  [address]  [place]  [place] [address]  Fax: [phone]

## 2010-06-26 NOTE — Discharge Summary (Signed)
NAME:  Karen [name], Karen [name] NO.:  [id number]   MEDICAL RECORD NO.:  [id number]          PATIENT TYPE:  INP   LOCATION:  1536                         FACILITY:  [facility]   PHYSICIAN:  Gaynelle Arabian, M.D.    DATE OF BIRTH:  1928-08-18   DATE OF ADMISSION:  06/22/2006  DATE OF DISCHARGE:  06/25/2006                               DISCHARGE SUMMARY   ADMITTING DIAGNOSES:  1. Recurrent dislocations, left total hip.  2. Hypertension.  3. Chronic constipation with recurrent impactions.  4. History of urinary tract infection.  5. History of urinary retention following surgery.  6. Diet-controlled type 2 diabetes mellitus.  7. History of gout.  8. History of fibromyalgia.   DISCHARGE DIAGNOSES:  1. Unstable left total hip, status post left total hip arthroplasty      revision, constrained liner.  2. Postoperative hypokalemia, improved.  3. Postoperative blood loss anemia.  4. Hypertension.  5. Chronic constipation with recurrent impactions.  6. History of urinary tract infection.  7. History of urinary retention following surgery.  8. Diet-controlled type 2 diabetes mellitus.  9. History of gout.  10.History of fibromyalgia.   PROCEDURE:  Jun 22, 2006, left total hip arthroplasty revision over to a  constrained liner.  Surgeon:  Dr. Wynelle Link.  Assistant:  Alexzandrew L.  Perkins, P.A.C.   CONSULTS:  None.   BRIEF HISTORY:  Karen [name] is a 77 year old female who sustained  three hip dislocation since Christmas. The initial one was rather  traumatic on Christmas Day, and the other two have been nontraumatic.  She presents now for revision of the components versus conversion over  to constrained liner.   LABORATORY DATA:  Pre-op CBC showed a hemoglobin 14.2, hematocrit of  41.1, white cell count 7.9.  Post-op hemoglobin 12.5, drifted down to  10.6, last known H&H 9.8 and 28.5.  PT/PTT on admission were 13.2 and  31, respectively.  INR 1.  Serial protimes followed.   Last known PT/INR  were 21.2 and 1.8.  Chem panel on admission did have a low potassium of  3.3 just slightly low on admission but potassium dropped post-op down to  2.7, and then came back up after supplementation, back up to 4.5.  Remaining Chem panel on admission all within normal limits.  Remaining  electrolytes remained within normal limits.  Pre-op UA with trace  leukocyte esterase, otherwise negative.  Blood group type A+.   HOSPITAL COURSE:  The patient was admitted to [facility] and  tolerated seizure well, later transferred to the recovery room on the  orthopedic floor, started on PCA and p.o. analgesics for pain control  following the surgery.  She had a little bit discomfort in her abdomen  postoperatively.  She had a low potassium on day one.  Otherwise, she  was doing pretty well.  We gave her some potassium supplements, held her  fluid pill and her blood pressures due to some asymptomatic hypotension.  We gave her a fluid challenge.  Her CBGs were actually running very well  postoperatively.  Unfortunately, the IV infiltrated on her left arm.  We  used a __________  for that.  By day #2, her pressures were a little bit better.  She started getting  up with physical therapy.  Dressing was changed and the incision looked  good.  She started walking, and she actually did extremely well, walking  about 240 feet with just minimal assist supervision, just doing the  constrained liner.  She was able to be weightbearing as tolerated and  with that she progressed very well with her physical therapy.  By the  following day, she had been weaned over to p.o. meds, tolerating her  diet, and was ready to go home on Jun 25, 2006.   DISCHARGE/PLAN:  1. The patient was discharged home on Jun 25, 2006.  2. Discharge diagnoses please see above.  3. Discharge meds:  Percocet, Robaxin, Coumadin.  4. Activity:  Weightbearing as tolerated.  Hip precautions - total hip      protocol,  daily dressing changes, may start showering.  5. Followup in 10-14 days after surgery.   DISPOSITION:  Home.   CONDITION ON DISCHARGE:  Improving.      Alexzandrew L. Perkins, P.A.C.      Gaynelle Arabian, M.D.  Electronically Signed    ALP/MEDQ  D:  08/04/2006  T:  08/04/2006  Job:  [id number]   cc:   Tommy Medal, M.D.  Fax: [phone]

## 2010-06-26 NOTE — Consult Note (Signed)
NAME:  Karen [name], Karen [name] NO.:  [id number]   MEDICAL RECORD NO.:  [id number]          PATIENT TYPE:  IPS   LOCATION:  4007                         FACILITY:  [facility]   PHYSICIAN:  Mark C. Karsten Ro, M.D.  DATE OF BIRTH:  11/13/1928   DATE OF CONSULTATION:  04/02/2004  DATE OF DISCHARGE:                                   CONSULTATION   HISTORY OF PRESENT ILLNESS:  The patient is a 77 year old white female who  is seen in hospital consultation today for further evaluation of elevated  PVRs after extensive lumbar spine surgery on March 23, 2004.  The patient  reports that prior to her hospitalization, she had no difficulty void and  has not had difficulty with recurrent UTIs or stress incontinence.  After  her surgery, she has been unable to void and has been requiring in-and-out  catheterizations for 400 to 600 mL of urine.  She what seems to be  diminished sensation and inability to initiate her urinary stream.   PAST MEDICAL HISTORY:  Positive for diverticulosis, osteoporosis, gout,  fibromyalgia.   PAST SURGICAL HISTORY:  She has had bilateral knee replacement.  She has  also had a cholecystectomy, A&P repair, right foot surgery, cervical  laminectomy.   MEDICATIONS:  1.  Maxzide.  2.  Tiazac.  3.  K-Dur.  4.  Allopurinol.  5.  Amitriptyline.  6.  Colace.  7.  Multivitamin.   ALLERGIES:  MORPHINE, CODEINE, ULTRAM.   SOCIAL HISTORY:  She denies alcohol or ever having used tobacco.   FAMILY HISTORY:  Noncontributory.   REVIEW OF SYSTEMS:  Positive for some constipation, reflux symptoms,  significant back pain after the surgery, otherwise negative.   LABORATORY DATA:  Her urinalysis from March 31, 2004, has grown greater  than 100,000 colonies of Escherichia coli and Proteus.  The sensitivities  are pending.  Her creatinine is normal at 0.7.   PHYSICAL EXAMINATION:  GENERAL:  The patient is a well-developed, well-  nourished, elderly white female in  mild to moderate distress.  VITAL SIGNS:  Temperature is 102.8, blood pressure is 110/58, pulse 88.  HEENT:  Normocephalic and atraumatic.  Oropharynx is clear.  NECK:  Supple with midline trachea.  CHEST:  Normal respiratory effort.  HEART:  Regular rate and rhythm.  ABDOMEN:  Soft and nontender.  Obese without mass.  GENITOURINARY:  Normal external female genitalia, normally placed urethral  meatus.  She has normal anus and perineum.  EXTREMITIES:  Without cyanosis, clubbing, or edema.  NEUROLOGY:  No gross focal neurologic deficits.  SKIN:  Warm and dry.   IMPRESSION:  1.  New onset voiding dysfunction after lumbar surgery.  It appears to be      multifactorial including:  Pain, pain medication, urinary tract      infection, and possibly some degree of temporary nerve praxis secondary      to the surgery.  2.  Escherichia coli and Proteus urinary tract infection currently on      amoxicillin.  She will likely need broader coverage, but will await      sensitivities.  RECOMMENDATIONS:  1.  I have discussed continued intermittent catheterization with the patient      versus indwelling Foley for now.  She is uncomfortable with the      catheterizations and would rather have an indwelling Foley catheter with      voiding trials in the future.  The Flomax and Urecholine does not appear      to be helping, so they can be stopped for now.  2.  Agree with antibiotics while awaiting culture results.  3.  Will keep Foley indwelling for a while.  Have her follow up as an      outpatient for voiding trial so that I can allow her pain and pain      medication use to diminish as well as allow her to become more      ambulatory.      MCO/MEDQ  D:  04/02/2004  T:  04/03/2004  Job:  [id number]   cc:   Jarvis Morgan, M.D.  [address] [place]  [place] [address]  Fax: [phone]

## 2010-06-26 NOTE — Discharge Summary (Signed)
NAME:  Karen [name], Karen [name] NO.:  [id number]   MEDICAL RECORD NO.:  [id number]          PATIENT TYPE:  INP   LOCATION:  1508                         [facility]ACILITY:  [facility]   PHYSICIAN:  Gaynelle Arabian, M.D.    DATE OF BIRTH:  1928-12-06   DATE OF ADMISSION:  02/28/2006  DATE OF DISCHARGE:  03/01/2006                               DISCHARGE SUMMARY   ADMISSION DIAGNOSES:  1. Dislocated left total hip.  2. Hypertension.  3. Constipation with recurrent infections.  4. History of urinary tract infections.  5. History of urinary retention following surgery.  6. Diet-controlled type 2 diabetes mellitus.  7. History of gout.  8. History of fibromyalgia.   DISCHARGE DIAGNOSIS:  1. Left dislocated total hip arthroplasty, status post closed      reduction left total hip.  2. Dislocated left total hip.  3. Hypertension.  4. Constipation with recurrent infections.  5. History of urinary tract infections.  6. History of urinary retention following surgery.  7. Diet-controlled type 2 diabetes mellitus.  8. History of gout.  9. History of fibromyalgia.   PROCEDURE:  February 28, 2006  -  Close reduction of dislocated left  total hip.  Surgeon Dr. Weber Cooks, Nurse Assistant  - Lockie Mola,  P.A.   CONSULTATIONS:  None.   BRIEF HISTORY:  The patient is a 77 year old female who sustained a  second dislocation of a total hip over the last six or seven months.  Her last hip dislocation was on  Christmas Day.  .  She subsequently  unfortunately sustained another dislocation and was admitted for closed  reduction.   LABORATORY DATA:  None.   EKG February 28, 2006:  Sinus rhythm with first degree AV block,  nonspecific intraventricular conduction block..  Acute left hip films February 28, 2006:  Dislocation left proximal femur  of total hip.  Chest x-ray February 28, 2006:  No active disease.  Postop hip film February 28, 2006:  Reduction of left hip dislocation.   HOSPITAL  COURSE:  The patient was admitted to [facility], seen  and evaluated by Dr. Weber Cooks. who was on-call for Dr. Wynelle Link. The  patient was taken to OR and underwent the above stated procedure without  complication.  She tolerated the procedure well, later transferred to  the orthopedic floor.  She was given medications through the night,  p.r.n. meds.  She was doing better by the next morning after the hip the  been reduced.  We arranged for physical therapy to evaluate her, ordered  a brace from [employer], a hip abduction brace, with parameters of 0  extension and 90 degrees of flexion, abduction 15 degrees, no adduction  and no internal rotation.  Once the brace was applied she underwent  doffing and donning teaching from PT.  Once this was completed she was  discharged home.   DISCHARGE/PLAN:  1. The patient was discharged home on March 01, 2006.  2. Discharge diagnoses;  please see above.  3. Discharge meds: No new meds; she is to resume all of her home meds.   DIET:  Resume home diet.   ACTIVITY:  Weightbearing as tolerated.  Brace at all times except at  night.   DISPOSITION:  Home.   CONDITION ON DISCHARGE:  Improved.   FOLLOWUP:  She will follow up with Dr. Wynelle Link next week.      Alexzandrew L. Dara Lords, P.A.      Gaynelle Arabian, M.D.  Electronically Signed    ALP/MEDQ  D:  05/03/2006  T:  05/03/2006  Job:  [id number]   cc:   Gaynelle Arabian, M.D.  Fax: [phone]   Tommy Medal, M.D.  Fax: [phone]

## 2010-06-26 NOTE — Op Note (Signed)
NAME:  MAZEL, IIDA NO.:  [id number]   MEDICAL RECORD NO.:  [id number]          PATIENT TYPE:  INP   LOCATION:  3008                         FACILITY:  [facility]   PHYSICIAN:  Ophelia Charter, M.D.DATE OF BIRTH:  Jan 26, 1929   DATE OF PROCEDURE:  03/23/2004  DATE OF DISCHARGE:                                 OPERATIVE REPORT   PREOPERATIVE DIAGNOSIS:  L4-5 grade 1/2 acquired spondylolisthesis,  degenerative disk disease L2-3, 3-4, 4-5 and 5-1, spinal stenosis.   POSTOPERATIVE DIAGNOSIS:  L4-5 grade 1/2 acquired spondylolisthesis,  degenerative disk disease L2-3, 3-4, 4-5 and 5-1, spinal stenosis.   OPERATION PERFORMED:  L4 Gill procedure; L3 laminectomy; bilateral L2 and L5  laminotomies (to treat the spinal stenosis); L4-5 posterior lumbar interbody  fusion; insertion of bilateral L4-5 interbody prosthesis (Capstone PEAK  cages); L4-5 posterior nonsegmental instrumentation with Legacy titanium  pedicle screws and rods; posterolateral arthrodesis L4-5 with local  morcellized autograft bone and Actos bone graft substitute.   SURGEON:  Ophelia Charter, M.D.   ASSISTANT:  Leeroy Cha, M.D.   ANESTHESIA:  General endotracheal.   ESTIMATED BLOOD LOSS:  300 mL.   SPECIMENS:  None.   DRAINS:  None.   INDICATIONS FOR PROCEDURE:  The patient is a 77 year old white female who  had suffered from severe back and leg pain consistent with neurogenic  claudication.  She failed medical management and was worked up with a lumbar  MRI which demonstrated that the patient had multilevel spinal stenosis at L2-  3, 3-4, 4-5 and to a lesser extent, 5-1, as well as a grade 1/2  spondylolisthesis at L4-5.  I discussed the various treatment options with  the patient including surgery.  The patient and her family have weighed the  risks, benefits and alternatives to surgery and decided to proceed with an  L4-5 fusion with a multilevel decompressive laminectomy.   DESCRIPTION OF PROCEDURE:  The patient was brought to the operating room by  the anesthesia team.  General endotracheal anesthesia was induced.  The  patient was then turned to the prone position on the Wilson frame.  Her  lumbosacral region was then prepared with Betadine scrub and Betadine  solution and sterile drapes were applied.  I then injected the area to be  incised with Marcaine with epinephrine solution.  I used a scalpel to make a  linear midline incision over the spinous processes from L2 to the upper  sacrum.  I used electrocautery to perform a subperiosteal dissection  exposing the spinous processes and laminae from L2 down to the upper sacrum.  I then obtained an intraoperative radiograph to confirm our location.  I  inserted the cerebellar retractor for exposure and then incised the  interspinous ligament at L2-3, 3-4, 4-5.  I then used the Leksell rongeur to  remove the spinous process and part of the lamina at L4 and L3.  We saved  this bone and later cleared it of soft tissue and morcellized it and used it  in the fusion process.  We then used high speed drill to perform  laminotomies bilaterally at  L4, 3 and L2.  I then used the Kerrison punch to  complete the Gill procedure at L4 where there was severe lateral recess  stenosis at this level secondary to an overgrowth of the ligamentum flavum,  facet joint as well as spondylolisthesis.  We then used the Kerrison punches  to widen the laminectomy at L3, the laminotomy at L2.  I then removed the  ligamentum flavum at L2-3, 3-4 and 4-5.  There was still some significant  stenosis distally at the thecal sac as it ran under the cephalad aspect of  the L5 lamina.  We then used the Kerrison punch to perform foraminotomies  about the bilateral L3, 4 and 5 nerve roots.  The L5 nerve root was  approached further distally, so we therefore used a high speed drill to  drill off the cephalad aspect of the L5 lamina and performed the  partial  laminectomy at the cephalad aspect of the L5 lamina to further decompress  the L5 nerve root distally.  We did this bilaterally and this completed the  decompression.   We now turned our attention to the arthrodesis.  We freed up the thecal sac  from the epidural tissue and then retracted the it medially with the  D'errico retractor.  We incised the L4-5 intervertebral disk and performed a  partial diskectomy using pituitary forceps and Epstein and Scoville curets.  We did this bilaterally.  We then prepared the vertebral end plates at L 4-5  for the fusion by placing a vertebral body distractor contralaterally and  then clearing the ipsilateral disk space of soft tissue using the curet.  We  then inserted a Capstone PEAK cage which had been prefilled with local  morcellized autograft bone and Actos bone graft substitute.  We inserted  into the ipsilateral distracted interspace the cage with a 10 x 22 mm cage.  We then filled medially in the disk space with local morcellized autograft  bone and Actifuse bone graft substitute and removed this after removing the  distractor from the contralateral disk space.  We cleared the contralateral  disk space of soft tissue using curets and placed a second Capstone PEAK  cage on that side which had been prefilled.  Of course, we did all this  after retracting the neural structures out of harm's way.  This completed  the posterior lumbar interbody fusion.   We now turned our attention to the instrumentation.  Under fluoroscopic  guidance, we cannulated the bilateral L4 and L4 pedicles with a ball probe.  We then tapped the pedicles with a 5.5 mm tap.  We then probed inside the  tapped pedicles to rule out cortical breeches and then inserted a 6.5 x 50  pedicle screw bilaterally at L4 and a 6.5 x 55 mm pedicle screws bilaterally  at L5.  We did this under fluoroscopic guidance and then we tapped along the medial aspect of the bilateral L4 and  L5 pedicles and noted there were no  cortical breeches and then the L4 and L5 pedicle nerve roots were well  decompressed.  We then connected the pedicle screws with the 40 mm lordotic  rod.  We mildly compressed the construct and then secured the rod in place  by placing the caps which we torqued appropriately.   We now turned our attention to the posterolateral arthrodesis.  We used the  high speed drill to decorticate the remainder of the L4-5 facet and pars  region as well as  the transverse processes of L4 and L5.  We then laid a  combination of local and morcellized autograft bone and Actifuse over the  decorticated posterolateral structures including the posterolateral  arthrodesis.  We then inspected the thecal sac and the bilateral L3, 4, and  5 roots and noted they were well decompressed.  We obtained stringent  hemostasis using bipolar electrocautery.  We copiously irrigated the wound  out with bacitracin solution, then removed the solution.  We then removed  the cerebellar retractors.  We reapproximated the patient's thoracolumbar  fascia with interrupted #1 Vicryl sutures, subcutaneous tissue with  interrupted 2-0 Vicryl suture and the skin with Steri-Strips and benzoin.  The wound was then coated with bacitracin ointment, sterile dressing was  applied, the drapes were removed.  The patient was subsequently returned to  supine position where she was extubated by the anesthesia team and  transported to the post anesthesia care unit in stable condition.  All  sponge, needle and instrument counts were correct at the end of the case.    JDJ/MEDQ  D:  03/23/2004  T:  03/24/2004  Job:  [id number]

## 2010-06-26 NOTE — Op Note (Signed)
NAME:  Karen [name], Karen [name] NO.:  [id number]   MEDICAL RECORD NO.:  [id number]          PATIENT TYPE:  INP   LOCATION:  [place]                         FACILITY:  [facility]   PHYSICIAN:  Gaynelle Arabian, M.D.    DATE OF BIRTH:  January 20, 1929   DATE OF PROCEDURE:  06/22/2006  DATE OF DISCHARGE:                               OPERATIVE REPORT   PREOP DIAGNOSIS:  Unstable left total hip arthroplasty.   POSTOP DIAGNOSIS.:  Unstable left total hip arthroplasty.   PROCEDURE:  Left total hip arthroplasty revision to constrained liner.   SURGEON:  Gaynelle Arabian, M.D.   ASSISTANT:  Arlee Muslim, PA-C   ANESTHESIA:  General.   ESTIMATED BLOOD LOSS:  Less than 100.   DRAINS:  None.   COMPLICATIONS:  None.   CONDITION:  Stable to recovery.   BRIEF CLINICAL NOTE:  Karen [name] is a 77 year old female who has  sustained three hip dislocations since Christmas Day.  Her initial one  was rather traumatic on Christmas Day and the other two have not been  traumatic.  She presents now for revision either of the components or  placement of constrained liner depending on the overall alignment.   PROCEDURE IN DETAIL:  After the successful administration of a general  anesthetic, the patient is placed in the right lateral decubitus  position with the left side up and held with the hip positioner.  The  left lower extremity was isolated from perineoplastic drapes and prepped  and draped in the usual sterile fashion.  The previous posterolateral  incision is reutilized.  The skin cut with a 10 blade through  subcutaneous tissue to the level of the fascia lata which is incised in  line with the skin incision.  The sciatic nerve was palpated and  protected.  The pseudocapsule had been torn off of the posterior femur  from the previous dislocations.   The hip was then dislocated.  The femoral head is removed off the  trunnion.  The anteversion of the femoral stem is about 25 degrees which  is  felt to be excellent anteversion.  The overall position of the  acetabular shell was also anatomic.  Prior to dislocating her I tested  the stability at full extension and full external rotation.  She was  stable at 70 degrees of flexion and 40 degrees of adduction.  I was able  to internally rotate her 90 degrees, but once we got to 90 degrees of  flexion she dislocated at about 30 degrees of internal rotation.   Given that the components are in good position I felt that changing  components would not be of any utility.  The stem that is in is the  maximum offset for that size.  Adding more offset would not have been an  option.  Switching to a larger bearing surface also would not have been  a good option.  I decided since this was mainly a soft tissue to go  ahead and place a constrained liner.   The femur was then retracted anteriorly to gain acetabular exposure.  The polyethylene extraction device  was placed and the polyethylene  extracted from the 52 mm acetabular shell.  We then impacted the  polyethylene for the constrained liner for the 52 shell with a 32 head.  I placed the 32.0 head back on the femoral stem and placed a locking  ring over the femoral neck.  The hip was reduced and then the locking  ring passed over the polyethylene left.  She had great stability  throughout.  She did not impinge at full extension, 20 degrees of  external rotation, 70 degrees flexion, 40 degrees adduction, 90 degrees  internal rotation and about 90 degrees of flexion and 60 degrees of  internal rotation.   We then copiously irrigated with saline solution and reattached the  pseudocapsule to the femur with interrupted #1 Ethilon through drill  holes.  The fascia lata was closed with interrupted #1 Vicryl, the subcu  closed with #1-0 and #2-0 Vicryl and skin with staples.  Incision  cleaned and dried and a bulky sterile dressing applied.  She is awakened  and transferred to recovery in stable  condition.      Gaynelle Arabian, M.D.  Electronically Signed     FA/MEDQ  D:  06/22/2006  T:  06/23/2006  Job:  [id number]

## 2010-06-26 NOTE — Op Note (Signed)
NAMEVERONICA, [name] NO.:  [id number]   MEDICAL RECORD NO.:  [id number]          PATIENT TYPE:  INP   LOCATION:  [id number]                         FACILITY:  [facility]   PHYSICIAN:  Weber Cooks, M.D.     DATE OF BIRTH:  [birth year]   DATE OF PROCEDURE:  02/28/2006  DATE OF DISCHARGE:                               OPERATIVE REPORT   PREOPERATIVE DIAGNOSIS:  Left dislocated total hip arthroplasty.   POSTOPERATIVE DIAGNOSIS:  Left dislocated total hip arthroplasty.   OPERATION:  Closed reduction, left total hip arthroplasty.   ANESTHESIA:  General.   SURGEON:  Weber Cooks, M.D.   ASSISTANT:  Lockie Mola, P.A.   ESTIMATED BLOOD LOSS:  None.   COMPLICATIONS:  None.   DISPOSITION:  Stable to PR.   Intraoperative x-ray shows concentric reduction.  No evidence of  fracture.   INDICATIONS:  This is a 77 year old female who sustained her second  dislocated total hip arthroplasty over the last 6-7 months.  Her last  dislocation was on Christmas day.  She was consented for the above  procedure.  All risks, which include inability to relocate the hip,  iatrogenic fracture, and loosening were all explained.  Questions were  encouraged and answered.   OPERATIVE REPORT:  She was brought to the operating room and placed in  supine position.  A general anesthesia was administered.  A direct  traction was applied, and 90/90 hip flexion/knee flexion  position/reduction was performed.  Intraoperative x-ray was obtained and  showed a concentric reduction with no evidence of fracture.  A knee  immobilizer was applied.  The patient was stable to the PR.      Weber Cooks, M.D.  Electronically Signed     PB/MEDQ  D:  02/28/2006  T:  02/28/2006  Job:  [id number]

## 2010-06-26 NOTE — Consult Note (Signed)
NAMEMarland Kitchen  AIRIEL, ANTOINE NO.:  [id number]   MEDICAL RECORD NO.:  [id number]          PATIENT TYPE:  INP   LOCATION:  [id number]                         FACILITY:  [facility]   PHYSICIAN:  Weber Cooks, M.D.     DATE OF BIRTH:  11-04-1928   DATE OF CONSULTATION:  02/28/2006  DATE OF DISCHARGE:                                 CONSULTATION   CHIEF COMPLAINT:  Left hip pain.   HISTORY:  This is a 77 year old female who in June, 2007 underwent a  left knee replacement.  She had an uncomplicated course until Christmas,  when she dislocated her hip with a minor maneuver, as per her.  She was  taking clothes out of her dryer today when she noticed immediate hip  pain and inability to bear weight on the left lower extremity, very  similar to what she had previously.  She was then taken to [facility]  ED, and x-rays were obtained.  I was consulted for further evaluation  and treatment.   She has a history of diabetes and hypertension.  She has had an  appendectomy, lumbar fusion, cervical disk surgery, cholecystectomy.  She is a nonsmoker, nondrinker.   MEDICATIONS:  She takes Mobic, Tiazac, Allopurinol, aspirin, K-Dur,  Zocor, calcitriol, amitriptyline.   PHYSICAL EXAMINATION:  GENERAL:  She is well-developed and well-  nourished in no apparent distress.  Pleasant female.  VITAL SIGNS:  Respirations 20, temperature 99.5, pulse is 90, blood  pressure 144/70.  She is 96% on room air.  EXTREMITIES:  She has a shortened, externally rotated left lower  extremity, palpable dorsalis pedis and posterior tibial pulse.  Sensation is intact to light touch over the L4-S1 distribution.  She has  5/5 strength in the same distribution as well.   X-rays show a posterior dislocated left total hip arthroplasty with no  obvious fracture.   IMPRESSION:  Dislocated left total hip arthroplasty.   PLAN:  We will perform a closed reduction in the OR today.  She was  consented for the above  procedure.  All risks, which include the ability  to perform a closed reduction, __________ fracture, and lucency were all  explained.  Questions were encouraged and answered.  She will be  admitted for pain control and will be seen and discharged in the morning  by Dr. Maureen Ralphs.      Weber Cooks, M.D.  Electronically Signed     PB/MEDQ  D:  02/28/2006  T:  02/28/2006  Job:  [id number]

## 2010-06-26 NOTE — Discharge Summary (Signed)
NAME:  Karen [name], Karen [name] NO.:  [id number]   MEDICAL RECORD NO.:  [id number]          PATIENT TYPE:  INP   LOCATION:  1504                         FACILITY:  [facility]   PHYSICIAN:  Gaynelle Arabian, M.D.    DATE OF BIRTH:  Aug 21, 1928   DATE OF ADMISSION:  08/04/2005  DATE OF DISCHARGE:  08/10/2005                                 DISCHARGE SUMMARY   ADMISSION DIAGNOSES:  1.  Osteoarthritis left hip.  2.  Hypertension.  3.  Chronic constipation with recurrent impactions.  4.  History of urinary tract infections  5.  History of urinary retention following surgery.  6.  Diet controlled type 2 diabetes mellitus.  7.  History of gout.  8.  History of fibromyalgia.   DISCHARGE DIAGNOSES:  1.  Osteoarthritis left hip status post left total hip replacement      arthroplasty.  2.  Postoperative acute blood loss anemia.  3.  Postoperative hypokalemia, improving.  4.  Mild postoperative hypotension, asymptomatic, likely secondary to postop      narcotics, resolved.  5.  Hypertension.  6.  Chronic constipation with recurrent impactions.  7.  History of urinary tract infections  8.  History of urinary retention following surgery.  9.  Diet controlled type 2 diabetes mellitus.  10. History of gout.  11. History of fibromyalgia.   PROCEDURES:  On August 04, 2005 a left total hip surgery Dr. Wynelle Link,  assistant Dara Lords, PA-C, anesthesia general.   CONSULTANTS:  Rehab services.   BRIEF HISTORY:  Karen [name] is a 77 year old female with end-stage  arthritis of the left hip and intractable pain now presents for total hip  arthroplasty.   LABORATORY DATA:  CBC on admission, hemoglobin 14.8, hematocrit 43.8.  Postop hemoglobin drifted down to 10.1 and then down to 8.9 and came back up  and was last noted at 9.3 and 27.0.  PT and PTT at preop 13.3 and 34  respectively.  INR 1.0.  Serial pro times followed, last PT and INR 17.1 and  1.4.  Chem panel on admission all within normal  limits.  Serial BMETs were  followed.  Glucose went up from 98 to 190 back down to 102.  Potassium  dropped to 3.6 to 3.4 and back up to 3.5.  Preop UA negative.  Blood group  type A positive.  Left hip films, preop July 29, 2005, left greater than  right degenerative joint changes of the hip.  Two-view chest July 29, 2005,  no active disease.  Postop hip and pelvis films status post left total hip  with no complicating features.   EKG on July 29, 2005, normal sinus rhythm, inferior infarct age  undetermined, confirmed by Dr. Terald Sleeper.   HOSPITAL COURSE:  The patient was admitted to [facility] for  operative procedure, later transferred to the recovery room, orthopedic  floor, tolerated the procedure well.  Started on PCA and p.o. analgesics for  pain control following surgery.  Did have some mild asymptomatic hypotension  postoperatively on day 1.  Actually doing very well from a pain control  standpoint but  just was unable to sleep much.  She states that medications  did that a little bit.  She was switched over and put on Restoril.  Started  getting up out of bed with therapy.  By day 2 she was still having a little  bit of the asymptomatic hypotension.  Blood pressure pills were held due to  the low pressure, which were felt to be narcotic, we discontinued the PCA  and her blood pressure improved and came back up.  Dressing was changed on  day 2, incision looked good.  She did have a drop in her hemoglobin  postoperatively down to 10.1 and then 8.9. On day 2 she was placed on iron.  Rechecked.  I&Os were followed very closely.  She had good output.  From a  therapy standpoint she started getting up and doing some short ambulation.  She was up about 125 feet.  She did live alone so there was going to be some  issues following postop care.  She was unable to go back to her home.  We  had ordered a rehab consult to come by and evaluate the patient.  She stayed  over at the  hospital through the weekend.  She started to get a little bit  of constipation therefore we used a suppository enema.  Hemoglobin was  rechecked and actually came back up.  It came back up to 9.3.  She was kept  on iron.  She continued to receive therapy.  She was seen by rehab services  after the weekend.  Felt that she was at a level that she would not need  inpatient rehab but we did know that she would not be able to go home due to  the fact that she lives alone.  She was noted on August 09, 2005 that she had a  lot of swelling in the thigh area.  This was felt to be due to the surgery  and some oozing because of her increased therapy and walking distances.  We  did check a Doppler due to the pain which was found to be negative.  By the  following day she was doing a little bit better.  She continued to progress  well.  Discharge planning had sent out the [facility] and information.  There was a  bed offer from [facility] which was a facility that could accept her the  next day.  She was seen on rounds on August 10, 2005 doing well.  She still had  the swelling but the pain was under a little bit better control.  The  incision was healing well.  She was progressing well with physical therapy  and she was discharged at that time.   DISCHARGE PLANS:  1.  The patient moves over to [place] facility on August 10, 2005.  2.  Discharge diagnosis, please see above.   DISCHARGE MEDICATIONS:  Current medications include Maxzide 37.5/25 p.o.  daily, Zocor 20 mg daily, Coumadin protocol.  Please titrate the INR between  2.0 and 3.0.  She is to be on Coumadin for a total of 3 weeks from the date  of surgery, August 04, 2005.  Diltiazem ER 240 mg p.o. daily, Colace 100 mg  p.o. b.i.d., Elavil 25 mg p.o. q.h.s., Zyloprim 100 mg p.o. daily, potassium  20 mEq daily, Accu-Vit tabs daily, Restoril 15-30 mg p.o. q.h.s., Nu-Iron  150 mg p.o. b.i.d.  She needs to remain on iron for 2 more weeks and  then discontinue the iron.  Tylenol 1 or 2 every 4-6 hours as needed for mild  pain, temp or headache.  Robaxin 500 mg p.o. q.6 h p.r.n. spasms.  Fleets  enema per rectum daily p.r.n.  Dulcolax suppositories per rectum as needed.  Percocet 1 or 2 every 4-6 hours as needed for pain.  Magnesium Citrate 300  mL p.o. p.r.n. constipation.   DIET:  Low sodium, modified carb diet.   ACTIVITY:  She is partial weightbearing 25-50% to the left lower extremity.  She needs to be up out of bed minimum b.i.d.   THERAPY:  Physical therapy and occupational therapy for gait training,  ambulation and ADLs.  Total hip protocol.  Hip precautions.   DAILY DRESSING CHANGES:  She may start showering however do no submerge the  incision under water.   FOLLOW UP:  She needs to follow up next week with Dr. Wynelle Link in the office.  Please contact the office at [phone] to arrange an appointment time the  week of July 9-13.   DISPOSITION:  [facility] nursing facility.   CONDITION ON DISCHARGE:  Improving.      Alexzandrew L. Dara Lords, P.A.      Gaynelle Arabian, M.D.  Electronically Signed    ALP/MEDQ  D:  08/10/2005  T:  08/10/2005  Job:  [id number]   cc:   Gaynelle Arabian, M.D.  Fax: [phone]   Tommy Medal, M.D.  Fax: [phone]

## 2010-06-26 NOTE — H&P (Signed)
NAME:  Karen [name], FOBES             ACCOUNT NO.:  [id number]   MEDICAL RECORD NO.:  [id number]          PATIENT TYPE:  INP   LOCATION:  NA                           FACILITY:  [facility]   PHYSICIAN:  Alexzandrew L. Perkins, P.A.DATE OF BIRTH:  1928-07-30   DATE OF ADMISSION:  08/04/2005  DATE OF DISCHARGE:                                HISTORY & PHYSICAL   CHIEF COMPLAINT:  Left hip pain.   HISTORY OF PRESENT ILLNESS:  The patient is a 77 year old female who has  been seen by Dr. Wynelle Link for ongoing severe left hip pain. She has been  extremely active throughout her life and had the acute onset of pain last  year when she was walking. She has been seen and worked up and found to have  significant arthritis in a rapidly progressive nature.  X-rays in the office  show near end-stage arthrosis of the left hip with bone on bone changes  throughout, and noted on MRI.  She has had a rapidly progressive onset of osteoarthritis in the hip.  It is  felt that due to her significant pain she can benefit from undergoing  surgery. Risks and benefits discussed. The patient is subsequently admitted  to the hospital.   ALLERGIES:  ULTRAM (CAUSES ITCHING).   INTOLERANCES:  MORPHINE (CAUSES NAUSEA AND VOMITING), CODEINE (CAUSES NAUSEA  AND VOMITING).   Even though she has a CODEINE INTOLERANCE, she is able to take Percocet.   CURRENT MEDICATIONS:  1.  Maxzide 25 mg p.o. q.a.m.  2.  Tiazac 240 mg p.o. q.a.m.  3.  K-Dur 20 mEq p.o. q.a.m.  4.  Allopurinol 100 mg p.o. q.a.m.  5.  Calcitriol 0.25 mg p.o. q.a.m.  6.  Zocor 20 mg p.o. q.h.s.  7.  Amitriptyline 50 mg p.o. q.h.s.  8.  Multivitamin.  9.  Ocuvite.  10. Tylenol Arthritis.  11. She has stopped Mobic, stopped baby aspirin and also stopped Aleve.   PAST MEDICAL HISTORY:  1.  Hypertension.  2.  History of constipation with recurrent impactions requiring      disimpaction.  3.  History of urinary tract infections and also urinary  retention after      previous surgeries.  4.  Diet controlled type 2 diabetes mellitus.  5.  History of gout.  6.  Osteoarthritis.  7.  Fibromyalgia.   PAST SURGICAL HISTORY:  Gallbladder in 1951 along with the appendix. Surgery  for obstructive common duct in 1958. Hernia repair in 1963. Salivary gland  stone removal in 1966. Surgery for a ruptured disk in 1972.  Right total  knee in 1997.  Left total knee in 2000. Right foot fusion surgery. Back  fusion in 2006. Also a bunionectomy and toe surgery.   SOCIAL HISTORY:  Widowed. Homemaker. Non smoker, no alcohol. Two sons. One  of her sons will be assisting with some of her care postoperatively.   FAMILY HISTORY:  Father deceased at age 59.  Mother deceased at age 75 with  history of ovarian cancer. She has a brother who is deceased with a history  of heart attack at 24.  REVIEW OF SYSTEMS:  GENERAL:  No fever, chills, night sweats.  NEURO:  No  seizures, syncope or paralysis.  RESPIRATORY:  No shortness of breath,  productive cough, hemoptysis.  CARDIOVASCULAR:  No chest pain, angina or  orthopnea.  GI:  She has a history of chronic constipation to the point of  impaction, requiring disimpactions. No nausea, vomiting. No diarrhea and  again her biggest issue is constipation.  GU:  She does have a history of  urinary tract infections, some urinary frequency due to diuretics.  She has  also had some urinary retention postoperatively in the past.  MUSCULOSKELETAL:  Left hip pain.   PHYSICAL EXAMINATION:  VITAL SIGNS:  Pulse 74, respirations 12, blood  pressure 148/74.  GENERAL:  A 77 year old white female well-developed, well-nourished, in no  acute distress. She is alert and oriented and cooperative, pleasant. An  excellent historian.  HEENT:  Normocephalic, atraumatic. Pupils are equal, round and reactive.  Oropharynx clear. Extraocular movements are intact. She is noted to wear  reading glasses.  NECK:  Supple.  CHEST:  Clear  anterior and posterior chest walls.  HEART:  Regular rate and rhythm with a grade 2/6 early systolic ejection  murmur.  ABDOMEN:  Soft, nontender, bowel sounds present.  RECTAL/GU:  Not done. Not pertinent to present illness.  EXTREMITIES:  Left hip:  90 degrees internal rotation, 20 degrees external  rotation, 30 degrees of abduction.   IMPRESSION:  1.  Osteoarthritis of the left hip.  2.  Hypertension.  3.  Chronic constipation with recurrent impactions.  4.  History of urinary tract infections.  5.  History of urinary retention following surgery.  6.  Diet controlled type 2 diabetes mellitus.  7.  History of gout.  8.  History of fibromyalgia.   PLAN:  The patient will be admitted to [facility] to undergo a  left total hip arthroplasty. Surgery will be performed by Dr. Gaynelle Arabian.  She has been seen preoperatively by Dr. Minna Antis and felt to be at low risk for  intraoperative cardiac events. Dr. Minna Antis will be notified as a courtesy and  will be consulted if medical assistance is required during her hospital  course or upon his recommendation will possibly use [facility]  service for consultation during her hospitalization.      Alexzandrew L. Dara Lords, P.A.     ALP/MEDQ  D:  08/03/2005  T:  08/03/2005  Job:  [id number]   cc:   Tommy Medal, M.D.  Fax: [phone]

## 2011-01-21 ENCOUNTER — Emergency Department (INDEPENDENT_AMBULATORY_CARE_PROVIDER_SITE_OTHER)
Admission: EM | Admit: 2011-01-21 | Discharge: 2011-01-21 | Disposition: A | Discharge status: Home / Self Care | Payer: Medicare Other | Source: Home / Self Care | Attending: Family Medicine | Admitting: Family Medicine

## 2011-01-21 ENCOUNTER — Encounter: Payer: Self-pay | Admitting: Emergency Medicine

## 2011-01-21 DIAGNOSIS — R6889 Other general symptoms and signs: Secondary | ICD-10-CM

## 2011-01-21 HISTORY — DX: Essential (primary) hypertension: I10

## 2011-01-21 MED ORDER — HYDROCOD POLST-CHLORPHEN POLST 10-8 MG/5ML PO LQCR: 5.0000 mL | Freq: Two times a day (BID) | ORAL | Status: DC | PRN

## 2011-01-21 MED ORDER — AZITHROMYCIN 250 MG PO TABS: 250.0000 mg | ORAL_TABLET | Freq: Every day | ORAL | Status: AC

## 2011-01-21 NOTE — ED Notes (Signed)
Pt here with flu like sx and fever since Monday.pt states her son lives with her and has same sx last week.sx constant cough with ocass yellow phelgm,body aches,chest congestion and pain with deep breathing.pt also reports of fevers 100.1-100.7 relieved by tylenol.on auscultation lungs clear with scatt rhonchi.sats 98% r/a.temp 100.1

## 2011-01-21 NOTE — ED Provider Notes (Signed)
History     CSN: [id number] Arrival date & time: 01/21/2011  5:24 PM   First MD Initiated Contact with Patient 01/21/11 1721      Chief Complaint  Patient presents with  . Influenza  . Bronchitis  . Fever    (Consider location/radiation/quality/duration/timing/severity/associated sxs/prior treatment) HPI Comments: Karen [name] presents for evaluation of persistent cough and fever over the last few days. She reports a sick contact in her son. She has been taking Aleve with mild relief. She reports that in the past, she has received antibiotics and a cough syrup, which she thinks is Tussionex.   Patient is a 77 y.o. female presenting with cough. The history is provided by the patient.  Cough This is a new problem. The current episode started more than 2 days ago. The problem occurs constantly. The cough is productive of sputum. The maximum temperature recorded prior to her arrival was 101 to 101.9 F. Associated symptoms include chills, rhinorrhea, myalgias and wheezing. Pertinent negatives include no ear pain, no sore throat and no shortness of breath. She has tried decongestants for the symptoms. She is not a smoker.    Past Medical History  Diagnosis Date  . Diabetes mellitus   . Hypertension     Past Surgical History  Procedure Date  . Knee surgery   . Replacement total hip w/  resurfacing implants     No family history on file.  History  Substance Use Topics  . Smoking status: Never Smoker   . Smokeless tobacco: Not on file  . Alcohol Use: No    OB History    Grav Para Term Preterm Abortions TAB SAB Ect Mult Living                  Review of Systems  Constitutional: Positive for fever and chills.  HENT: Positive for congestion and rhinorrhea. Negative for ear pain, sore throat and trouble swallowing.   Eyes: Negative.   Respiratory: Positive for cough and wheezing. Negative for shortness of breath.   Cardiovascular: Negative.   Gastrointestinal: Negative.     Genitourinary: Negative.   Musculoskeletal: Positive for myalgias.  Skin: Negative.     Allergies  Codeine; Morphine and related; and Ultram  Home Medications   Current Outpatient Rx  Name Route Sig Dispense Refill  . ASPIRIN 81 MG PO TABS Oral Take 81 mg by mouth daily.      . MELOXICAM 7.5 MG PO TABS Oral Take 7.5 mg by mouth daily.      Marland Kitchen METFORMIN HCL 500 MG PO TABS Oral Take 500 mg by mouth 2 (two) times daily with a meal.      . OMEPRAZOLE 20 MG PO CPDR Oral Take 20 mg by mouth daily.      Marland Kitchen POTASSIUM CHLORIDE CRYS CR 20 MEQ PO TBCR Oral Take 20 mEq by mouth 2 (two) times daily.      Marland Kitchen SIMVASTATIN 20 MG PO TABS Oral Take 20 mg by mouth at bedtime.      . TELMISARTAN 80 MG PO TABS Oral Take 80 mg by mouth daily.      Marland Kitchen TIMOLOL MALEATE 0.25 % OP SOLN  1 drop 2 (two) times daily.      . AZITHROMYCIN 250 MG PO TABS Oral Take 1 tablet (250 mg total) by mouth daily. Take two tablets on first day, then one tablet each day for four days 6 tablet 0  . HYDROCOD POLST-CHLORPHEN POLST 10-8 MG/5ML PO LQCR Oral Take  5 mLs by mouth every 12 (twelve) hours as needed. 140 mL 0    BP 179/88  Pulse 108  Temp(Src) 100.1 F (37.8 C) (Oral)  Resp 18  SpO2 98%  Physical Exam  Constitutional: She is oriented to person, place, and time. She appears well-developed and well-nourished.  HENT:  Head: Normocephalic and atraumatic.  Right Ear: Tympanic membrane and external ear normal.  Left Ear: Tympanic membrane and external ear normal.  Nose: Nose normal.  Mouth/Throat: Uvula is midline, oropharynx is clear and moist and mucous membranes are normal.  Eyes: EOM are normal. Pupils are equal, round, and reactive to light.  Neck: Normal range of motion.  Cardiovascular: Normal rate and regular rhythm.   Pulmonary/Chest: Effort normal and breath sounds normal. She has no wheezes. She has no rales.  Neurological: She is alert and oriented to person, place, and time.  Skin: Skin is warm and dry.     ED Course  Procedures (including critical care time)  Labs Reviewed - No data to display No results found.   1. Influenza-like illness       MDM  Likely viral or influenza-like illness in absence of findings on exam; will give delayed rx for z-pack and Tussionex for symptom relief.        Marcell Anger, MD 01/21/11 [phone]

## 2011-04-15 DIAGNOSIS — E119 Type 2 diabetes mellitus without complications: Secondary | ICD-10-CM | POA: Diagnosis not present

## 2011-05-17 DIAGNOSIS — M67919 Unspecified disorder of synovium and tendon, unspecified shoulder: Secondary | ICD-10-CM | POA: Diagnosis not present

## 2011-05-17 DIAGNOSIS — IMO0002 Reserved for concepts with insufficient information to code with codable children: Secondary | ICD-10-CM | POA: Diagnosis not present

## 2011-05-17 DIAGNOSIS — M719 Bursopathy, unspecified: Secondary | ICD-10-CM | POA: Diagnosis not present

## 2011-05-17 DIAGNOSIS — M159 Polyosteoarthritis, unspecified: Secondary | ICD-10-CM | POA: Diagnosis not present

## 2011-05-17 DIAGNOSIS — IMO0001 Reserved for inherently not codable concepts without codable children: Secondary | ICD-10-CM | POA: Diagnosis not present

## 2011-05-27 DIAGNOSIS — I10 Essential (primary) hypertension: Secondary | ICD-10-CM | POA: Diagnosis not present

## 2011-05-27 DIAGNOSIS — E7800 Pure hypercholesterolemia, unspecified: Secondary | ICD-10-CM | POA: Diagnosis not present

## 2011-05-27 DIAGNOSIS — E119 Type 2 diabetes mellitus without complications: Secondary | ICD-10-CM | POA: Diagnosis not present

## 2011-05-27 DIAGNOSIS — E559 Vitamin D deficiency, unspecified: Secondary | ICD-10-CM | POA: Diagnosis not present

## 2011-05-31 DIAGNOSIS — E119 Type 2 diabetes mellitus without complications: Secondary | ICD-10-CM | POA: Diagnosis not present

## 2011-05-31 DIAGNOSIS — E7800 Pure hypercholesterolemia, unspecified: Secondary | ICD-10-CM | POA: Diagnosis not present

## 2011-05-31 DIAGNOSIS — I10 Essential (primary) hypertension: Secondary | ICD-10-CM | POA: Diagnosis not present

## 2011-05-31 DIAGNOSIS — E559 Vitamin D deficiency, unspecified: Secondary | ICD-10-CM | POA: Diagnosis not present

## 2011-06-01 DIAGNOSIS — H2510 Age-related nuclear cataract, unspecified eye: Secondary | ICD-10-CM | POA: Diagnosis not present

## 2011-07-12 DIAGNOSIS — H269 Unspecified cataract: Secondary | ICD-10-CM | POA: Diagnosis not present

## 2011-07-12 DIAGNOSIS — H2510 Age-related nuclear cataract, unspecified eye: Secondary | ICD-10-CM | POA: Diagnosis not present

## 2011-07-13 DIAGNOSIS — H2510 Age-related nuclear cataract, unspecified eye: Secondary | ICD-10-CM | POA: Diagnosis not present

## 2011-07-13 DIAGNOSIS — Z961 Presence of intraocular lens: Secondary | ICD-10-CM | POA: Diagnosis not present

## 2011-08-02 DIAGNOSIS — H2510 Age-related nuclear cataract, unspecified eye: Secondary | ICD-10-CM | POA: Diagnosis not present

## 2011-08-02 DIAGNOSIS — H269 Unspecified cataract: Secondary | ICD-10-CM | POA: Diagnosis not present

## 2011-08-19 DIAGNOSIS — K922 Gastrointestinal hemorrhage, unspecified: Secondary | ICD-10-CM | POA: Diagnosis not present

## 2011-08-19 DIAGNOSIS — E119 Type 2 diabetes mellitus without complications: Secondary | ICD-10-CM | POA: Diagnosis not present

## 2011-09-02 DIAGNOSIS — K649 Unspecified hemorrhoids: Secondary | ICD-10-CM | POA: Diagnosis not present

## 2011-09-08 DIAGNOSIS — Z1231 Encounter for screening mammogram for malignant neoplasm of breast: Secondary | ICD-10-CM | POA: Diagnosis not present

## 2011-10-19 ENCOUNTER — Emergency Department (HOSPITAL_COMMUNITY): Payer: Medicare Other

## 2011-10-19 ENCOUNTER — Emergency Department (HOSPITAL_COMMUNITY)
Admission: EM | Admit: 2011-10-19 | Discharge: 2011-10-20 | Disposition: A | Discharge status: Home / Self Care | Payer: Medicare Other | Attending: Emergency Medicine | Admitting: Emergency Medicine

## 2011-10-19 ENCOUNTER — Encounter (HOSPITAL_COMMUNITY): Payer: Self-pay | Admitting: Emergency Medicine

## 2011-10-19 DIAGNOSIS — R4182 Altered mental status, unspecified: Secondary | ICD-10-CM | POA: Insufficient documentation

## 2011-10-19 DIAGNOSIS — I10 Essential (primary) hypertension: Secondary | ICD-10-CM | POA: Insufficient documentation

## 2011-10-19 DIAGNOSIS — F411 Generalized anxiety disorder: Secondary | ICD-10-CM | POA: Insufficient documentation

## 2011-10-19 DIAGNOSIS — R404 Transient alteration of awareness: Secondary | ICD-10-CM | POA: Diagnosis not present

## 2011-10-19 DIAGNOSIS — Z79899 Other long term (current) drug therapy: Secondary | ICD-10-CM | POA: Insufficient documentation

## 2011-10-19 DIAGNOSIS — T50904A Poisoning by unspecified drugs, medicaments and biological substances, undetermined, initial encounter: Secondary | ICD-10-CM | POA: Insufficient documentation

## 2011-10-19 DIAGNOSIS — E119 Type 2 diabetes mellitus without complications: Secondary | ICD-10-CM | POA: Insufficient documentation

## 2011-10-19 DIAGNOSIS — IMO0002 Reserved for concepts with insufficient information to code with codable children: Secondary | ICD-10-CM | POA: Insufficient documentation

## 2011-10-19 DIAGNOSIS — F19921 Other psychoactive substance use, unspecified with intoxication with delirium: Secondary | ICD-10-CM | POA: Insufficient documentation

## 2011-10-19 HISTORY — DX: Fibromyalgia: M79.7

## 2011-10-19 HISTORY — DX: Unspecified osteoarthritis, unspecified site: M19.90

## 2011-10-19 HISTORY — DX: Personal history of urinary (tract) infections: Z87.440

## 2011-10-19 HISTORY — DX: Constipation, unspecified: K59.00

## 2011-10-19 HISTORY — DX: Personal history of other diseases of the musculoskeletal system and connective tissue: Z87.39

## 2011-10-19 MED ORDER — SODIUM CHLORIDE 0.9 % IV BOLUS (SEPSIS)
1000.0000 mL | Freq: Once | INTRAVENOUS | Status: DC
Start: 2011-10-19 — End: 2011-10-20

## 2011-10-19 MED ORDER — LORAZEPAM 2 MG/ML IJ SOLN
1.0000 mg | Freq: Once | INTRAMUSCULAR | Status: AC
  Administered 2011-10-20: 1 mg via INTRAMUSCULAR
  Filled 2011-10-19: qty 1

## 2011-10-19 NOTE — ED Notes (Signed)
Unable to get pt into a gown or complete EKG due to pts cooperation level. MD and RN aware.

## 2011-10-19 NOTE — ED Notes (Signed)
Pt has not been taking her medications for the past few days either

## 2011-10-19 NOTE — ED Notes (Signed)
Per EMS per family pt has been agitated, not eating or drinking, poor urination, for the past few days  Pt is angry and agitated upon arrival   Pt denies pain

## 2011-10-19 NOTE — ED Provider Notes (Addendum)
History     CSN: [id number]  Arrival date & time 10/19/11  2219   First MD Initiated Contact with Patient 10/19/11 2309      Chief Complaint  Patient presents with  . Agitation    (Consider location/radiation/quality/duration/timing/severity/associated sxs/prior treatment) The history is provided by the patient and a relative. The history is limited by the condition of the patient.  Karen [name] is a 78 y.o. female hx of DM, HTN here with AMS. As per son, patient had been more agitated and cursing for the last 2 days. She was never like this before and has no psychiatric history. She may have been started on a new medicine but neither the son nor her know what it is. She said that she came here so that "she wouldn't die" and that she felt that she was dying. Denies fever, chills, abdominal pain.    Level V caveat- AMS  Past Medical History  Diagnosis Date  . Diabetes mellitus   . Hypertension     Past Surgical History  Procedure Date  . Knee surgery   . Replacement total hip w/  resurfacing implants   . Spinal fusion     History reviewed. No pertinent family history.  History  Substance Use Topics  . Smoking status: Never Smoker   . Smokeless tobacco: Not on file  . Alcohol Use: No    OB History    Grav Para Term Preterm Abortions TAB SAB Ect Mult Living                  Review of Systems  Unable to perform ROS: Mental status change    Allergies  Codeine; Morphine and related; and Ultram  Home Medications   Current Outpatient Rx  Name Route Sig Dispense Refill  . AMITRIPTYLINE HCL 25 MG PO TABS Oral Take 50 mg by mouth at bedtime.    . ASPIRIN 81 MG PO TABS Oral Take 81 mg by mouth daily.      Marland Kitchen DOCUSATE SODIUM 100 MG PO CAPS Oral Take 100 mg by mouth daily.    Marland Kitchen MAGNESIUM OXIDE 400 MG PO TABS Oral Take 400 mg by mouth daily.    . MELOXICAM 7.5 MG PO TABS Oral Take 7.5 mg by mouth daily.      Marland Kitchen METFORMIN HCL 500 MG PO TABS Oral Take 500 mg by  mouth 2 (two) times daily with a meal.      . OMEPRAZOLE 20 MG PO CPDR Oral Take 20 mg by mouth daily.      Marland Kitchen POTASSIUM CHLORIDE CRYS ER 20 MEQ PO TBCR Oral Take 20 mEq by mouth 2 (two) times daily.      Marland Kitchen SIMVASTATIN 20 MG PO TABS Oral Take 20 mg by mouth at bedtime.      Marland Kitchen SITAGLIPTIN PHOSPHATE 100 MG PO TABS Oral Take 100 mg by mouth daily.    Marland Kitchen TIMOLOL MALEATE 0.25 % OP SOLN  1 drop 2 (two) times daily.        SpO2 99%  Physical Exam  Nursing note and vitals reviewed. Constitutional:       Anxious, agitated, cursing.   HENT:  Head: Normocephalic.       OP dry  Eyes: Conjunctivae are normal. Pupils are equal, round, and reactive to light.  Neck: Normal range of motion. Neck supple.  Cardiovascular: Normal rate, regular rhythm and normal heart sounds.   Pulmonary/Chest: Effort normal and breath sounds normal.  Abdominal: Soft.  Bowel sounds are normal.  Musculoskeletal: Normal range of motion.  Neurological: She is alert.       confused  Skin: Skin is warm and dry.  Psychiatric:       Agitated, poor judgement.     ED Course  Procedures (including critical care time)  Labs Reviewed  CBC WITH DIFFERENTIAL - Abnormal; Notable for the following:    Neutrophils Relative 80 (*)     Neutro Abs 8.2 (*)     Lymphocytes Relative 11 (*)     All other components within normal limits  COMPREHENSIVE METABOLIC PANEL - Abnormal; Notable for the following:    Sodium 132 (*)     Glucose, Bld 143 (*)     GFR calc non Af Amer 77 (*)     GFR calc Af Amer 89 (*)     All other components within normal limits  URINALYSIS, ROUTINE W REFLEX MICROSCOPIC - Abnormal; Notable for the following:    Ketones, ur 15 (*)     All other components within normal limits  URINE RAPID DRUG SCREEN (HOSP PERFORMED)   Dg Chest 2 View  10/20/2011  **ADDENDUM** CREATED: 10/19/2011 23:58:33  **END ADDENDUM** SIGNED BY: Dalene Carrow.,  M.D.   10/19/2011  *RADIOLOGY REPORT*  Clinical Data: Altered mental  status.  CHEST - 2 VIEW  Comparison: August 06, 2007.  Findings: No acute pulmonary disease is noted.  Bony thorax is intact.  Mild tortuosity of descending thoracic aorta is noted.  IMPRESSION: No acute pulmonary disease is noted.   Original Report Authenticated By: Dalene Carrow., M.D.    Ct Head Wo Contrast  10/19/2011  *RADIOLOGY REPORT*  Clinical Data: Increasing agitation.  Not taking medicine.  CT HEAD WITHOUT CONTRAST  Technique:  Contiguous axial images were obtained from the base of the skull through the vertex without contrast.  Comparison: None.  Findings: Mild cerebral atrophy. The ventricles and sulci are symmetrical without significant effacement, displacement, or dilatation. No mass effect or midline shift. No abnormal extra- axial fluid collections. The grey-white matter junction is distinct. Basal cisterns are not effaced. No acute intracranial hemorrhage. No depressed skull fractures.  Calcification in the pineal region and cord plexus.  Vascular calcifications. Visualized paranasal sinuses and mastoid air cells are not opacified.  IMPRESSION: No acute intracranial abnormalities.   Original Report Authenticated By: Neale Burly, M.D.      1. Agitation      Date: 10/19/2011  Rate: 86  Rhythm: normal sinus rhythm  QRS Axis: normal  Intervals: normal  ST/T Wave abnormalities: normal  Conduction Disutrbances:none  Narrative Interpretation:   Old EKG Reviewed: unchanged    MDM  Karen [name] is a 78 y.o. female hx of DM, HTN here with AMS. She is more agitated than usual. Will need to do labs, ua, CT head for medical clearance. Will likely need ACT to be involved afterwards.   3:42 AM  Labs, UA, CT head unremarkable. ACT to see patient. telepsych ordered.   8:06 AM ACT saw the patient. Patient still delirious at times. Will admit to medicine for delirium. I called Dr. Grandville Silos from [facility], who wants a psych consult prior to admission. Psych consult called, I  signed out to Dr. Zenia Resides in the ED.        Wandra Arthurs, MD 10/20/11 [id number]  Wandra Arthurs, MD 10/20/11 [id number]  Wandra Arthurs, MD 10/20/11 [phone]

## 2011-10-20 ENCOUNTER — Encounter (HOSPITAL_COMMUNITY): Payer: Self-pay

## 2011-10-20 LAB — COMPREHENSIVE METABOLIC PANEL
ALT: 20 U/L (ref 0–35)
AST: 34 U/L (ref 0–37)
Albumin: 3.8 g/dL (ref 3.5–5.2)
Alkaline Phosphatase: 81 U/L (ref 39–117)
Calcium: 9.7 mg/dL (ref 8.4–10.5)
GFR calc Af Amer: 89 mL/min — ABNORMAL LOW (ref >90)
Glucose, Bld: 143 mg/dL — ABNORMAL HIGH (ref 70–99)
Potassium: 3.6 mEq/L (ref 3.5–5.1)
Sodium: 132 mEq/L — ABNORMAL LOW (ref 135–145)
Total Protein: 6.7 g/dL (ref 6.0–8.3)

## 2011-10-20 LAB — RAPID URINE DRUG SCREEN, HOSP PERFORMED
Amphetamines: NOT DETECTED
Barbiturates: NOT DETECTED
Benzodiazepines: NOT DETECTED
Cocaine: NOT DETECTED

## 2011-10-20 LAB — URINALYSIS, ROUTINE W REFLEX MICROSCOPIC
Bilirubin Urine: NEGATIVE
Glucose, UA: NEGATIVE mg/dL
Ketones, ur: 15 mg/dL — AB
Nitrite: NEGATIVE
Specific Gravity, Urine: 1.015 (ref 1.005–1.030)
pH: 6 (ref 5.0–8.0)

## 2011-10-20 LAB — CBC WITH DIFFERENTIAL/PLATELET
Basophils Absolute: 0 10*3/uL (ref 0.0–0.1)
Basophils Relative: 0 % (ref 0–1)
Eosinophils Absolute: 0.1 10*3/uL (ref 0.0–0.7)
Eosinophils Relative: 1 % (ref 0–5)
Lymphs Abs: 1.1 10*3/uL (ref 0.7–4.0)
MCH: 32.7 pg (ref 26.0–34.0)
MCV: 95.9 fL (ref 78.0–100.0)
Neutrophils Relative %: 80 % — ABNORMAL HIGH (ref 43–77)
Platelets: 207 10*3/uL (ref 150–400)
RBC: 3.95 MIL/uL (ref 3.87–5.11)
RDW: 13.8 % (ref 11.5–15.5)

## 2011-10-20 LAB — GLUCOSE, CAPILLARY: Glucose-Capillary: 103 mg/dL — ABNORMAL HIGH (ref 70–99)

## 2011-10-20 MED ORDER — INSULIN ASPART 100 UNIT/ML ~~LOC~~ SOLN: 0.0000 [IU] | Freq: Every day | SUBCUTANEOUS | Status: DC

## 2011-10-20 MED ORDER — IBUPROFEN 200 MG PO TABS: 600.0000 mg | ORAL_TABLET | Freq: Three times a day (TID) | ORAL | Status: DC | PRN

## 2011-10-20 MED ORDER — QUETIAPINE FUMARATE 50 MG PO TABS: 50.0000 mg | ORAL_TABLET | Freq: Every day | ORAL | Status: DC

## 2011-10-20 MED ORDER — METFORMIN HCL 500 MG PO TABS
500.0000 mg | ORAL_TABLET | Freq: Two times a day (BID) | ORAL | Status: DC
  Administered 2011-10-20: 500 mg via ORAL
  Filled 2011-10-20 (×3): qty 1

## 2011-10-20 MED ORDER — LINAGLIPTIN 5 MG PO TABS
5.0000 mg | ORAL_TABLET | Freq: Every day | ORAL | Status: DC
  Administered 2011-10-20: 5 mg via ORAL
  Filled 2011-10-20: qty 1

## 2011-10-20 MED ORDER — ASPIRIN EC 81 MG PO TBEC
81.0000 mg | DELAYED_RELEASE_TABLET | Freq: Every day | ORAL | Status: DC
  Administered 2011-10-20: 81 mg via ORAL
  Filled 2011-10-20: qty 1

## 2011-10-20 MED ORDER — SIMVASTATIN 20 MG PO TABS
20.0000 mg | ORAL_TABLET | Freq: Every day | ORAL | Status: DC
  Filled 2011-10-20: qty 1

## 2011-10-20 MED ORDER — PANTOPRAZOLE SODIUM 40 MG PO TBEC
40.0000 mg | DELAYED_RELEASE_TABLET | Freq: Every day | ORAL | Status: DC
  Administered 2011-10-20: 40 mg via ORAL
  Filled 2011-10-20: qty 1

## 2011-10-20 MED ORDER — INSULIN ASPART 100 UNIT/ML ~~LOC~~ SOLN
0.0000 [IU] | Freq: Three times a day (TID) | SUBCUTANEOUS | Status: DC
  Administered 2011-10-20: 3 [IU] via SUBCUTANEOUS
  Filled 2011-10-20 (×2): qty 3

## 2011-10-20 MED ORDER — LORAZEPAM 1 MG PO TABS
1.0000 mg | ORAL_TABLET | Freq: Three times a day (TID) | ORAL | Status: DC | PRN
  Administered 2011-10-20: 1 mg via ORAL
  Filled 2011-10-20: qty 1

## 2011-10-20 MED ORDER — POTASSIUM CHLORIDE CRYS ER 20 MEQ PO TBCR
20.0000 meq | EXTENDED_RELEASE_TABLET | Freq: Two times a day (BID) | ORAL | Status: DC
  Administered 2011-10-20: 20 meq via ORAL
  Filled 2011-10-20: qty 1

## 2011-10-20 MED ORDER — INFLUENZA VIRUS VACC SPLIT PF IM SUSP: 0.5000 mL | INTRAMUSCULAR | Status: DC

## 2011-10-20 MED ORDER — DOCUSATE SODIUM 100 MG PO CAPS
100.0000 mg | ORAL_CAPSULE | Freq: Every day | ORAL | Status: DC
  Administered 2011-10-20: 100 mg via ORAL
  Filled 2011-10-20: qty 1

## 2011-10-20 MED ORDER — AMITRIPTYLINE HCL 25 MG PO TABS: 50.0000 mg | ORAL_TABLET | Freq: Every day | ORAL | Status: DC

## 2011-10-20 MED ORDER — ACETAMINOPHEN 325 MG PO TABS: 650.0000 mg | ORAL_TABLET | ORAL | Status: DC | PRN

## 2011-10-20 NOTE — [facility] Counselor (Signed)
Telepsych completed and recommended medication changes. Assessed possibilities of a short episode of delirium. Did not recommend psych IPT. Informed EDP and pt will be d/c to care of children.

## 2011-10-20 NOTE — ED Notes (Signed)
Pt sleeping at this time. Pt's son that lives with her stated that the past several days she has all of sudden become confused, "saying crazy things that don't make sense".  Pt's son states that he doesn't know if it could be all her medications that her doctor has her on causing this or if he changes the doses that could have caused this confusion.  Son also stated that pt told his brother on the phone that she hasn't been able to sleep in several days and he doesn't know if this could be part of the cause.  "Normally she is the person with all the right answers that everyone ask bc she knows/remebers everything".  I explained that we are waiting on the ACT team to come evaluate her, so we can decide further treatment plan.

## 2011-10-20 NOTE — ED Notes (Signed)
Attempted to move pt to TCU, they're full and unable to take pt.  Pt to remain in room 9 for now.

## 2011-10-20 NOTE — ED Notes (Signed)
Pt becoming increasingly agitated, walking into hall and yelling.  Pt agreed to take an ativan tablet.

## 2011-10-20 NOTE — ED Notes (Signed)
Left VM for ACT team to see how much longer it will be before the pt is assessed.  Awaiting call back.  Informed pt son that call was placed and waiting for returned call.

## 2011-10-20 NOTE — BH Assessment (Signed)
Assessment Note   Karen [name] is an 78 y.o. female brought in by her family stating she has had some agitation and confusion over the past few weeks. Pt was pleasant and cooperative. Pt stated she had mostly been "saying things that she hadn't in the past". Pt denies SI, HI or AH/VH. Pt was able to answer directly my questions, but did report having some problems remembering and recalling things lately. Pt stated she had not been eating or drinking much over the past few days, but was not able to identify anything specific as to why her appetite had declined. Pt stated that she had eaten good while here in the ED. She stated that same was true for her sleeping. When discussing possible depression, pt stated that something had happened recently and grew sad. Pt then stated it was hard to talk about, but then was not able to recall specifically what it was she was sad about. Pt was not able to recall three simple words when asked to remember them with a question or two in between. Pt could only remember one item. Pt stated "I think so, but not sure what" when asked if she had any recent changes to her medications. In discussing with EDP a telepsych was recommended to identify if psych issues or delirium. Requested telepsych.  Axis I: Mood DO NOS; r/o delirium Axis II: Deferred Axis III:  Past Medical History  Diagnosis Date  . Diabetes mellitus   . Hypertension   . H/O: gout   . Fibromyalgia   . Constipation   . Hx: UTI (urinary tract infection)   . Arthritis    Axis IV: other psychosocial or environmental problems Axis V: 51-60 moderate symptoms  Past Medical History:  Past Medical History  Diagnosis Date  . Diabetes mellitus   . Hypertension   . H/O: gout   . Fibromyalgia   . Constipation   . Hx: UTI (urinary tract infection)   . Arthritis     Past Surgical History  Procedure Date  . Knee surgery   . Replacement total hip w/  resurfacing implants   . Spinal fusion   .  Joint replacement left hip-2007, left knee-2000, right knee-1997    Left Hip, Bilateral Knee replacements  . Back surgery 1972  . Closed reduction of left total hip     x 3  . Right foot surgery     x 2  . Appendectomy   . Cholecystectomy   . Hernia repair   . Salivary gland stone extraction     Family History: History reviewed. No pertinent family history.  Social History:  reports that she has never smoked. She has never used smokeless tobacco. She reports that she does not drink alcohol or use illicit drugs.  Additional Social History:  Alcohol / Drug Use Pain Medications: N/A Prescriptions: See PTA Listing Over the Counter: N/A History of alcohol / drug use?: No history of alcohol / drug abuse  CIWA: CIWA-Ar BP: 160/62 mmHg Pulse Rate: 112  COWS:    Allergies:  Allergies  Allergen Reactions  . Codeine   . Morphine And Related   . Ultram (Tramadol Hcl) Itching    Home Medications:  (Not in a hospital admission)  OB/GYN Status:  No LMP recorded. Patient is postmenopausal.  General Assessment Data Location of Assessment: [facility] Living Arrangements: Children Can pt return to current living arrangement?: Yes Admission Status: Voluntary Is patient capable of signing voluntary admission?: Yes Transfer from:  Acute Hospital Referral Source: Self/Family/Friend  Education Status Is patient currently in school?: No  Risk to self Suicidal Ideation: No Suicidal Intent: No Is patient at risk for suicide?: No Suicidal Plan?: No Access to Means: No What has been your use of drugs/alcohol within the last 12 months?: None Previous Attempts/Gestures: No Other Self Harm Risks: Confusion, memory loss Triggers for Past Attempts: None known Intentional Self Injurious Behavior: None Family Suicide History: No Recent stressful life event(s): Recent negative physical changes Persecutory voices/beliefs?: No Depression: Yes Depression Symptoms: Insomnia;Feeling  angry/irritable Substance abuse history and/or treatment for substance abuse?: No Suicide prevention information given to non-admitted patients: Not applicable  Risk to Others Homicidal Ideation: No Thoughts of Harm to Others: No Current Homicidal Intent: No Current Homicidal Plan: No Access to Homicidal Means: No Identified Victim: N/A History of harm to others?: No Assessment of Violence: None Noted Violent Behavior Description: Calm, cooperative, pleasant Does patient have access to weapons?: No Criminal Charges Pending?: No Does patient have a court date: No  Psychosis Hallucinations: None noted Delusions: None noted  Mental Status Report Appear/Hygiene: Disheveled Eye Contact: Good Motor Activity: Freedom of movement Speech: Logical/coherent Level of Consciousness: Quiet/awake Mood: Other (Comment) (pleasant - appropriate to circumstances) Affect: Appropriate to circumstance Anxiety Level: Minimal Thought Processes: Coherent;Relevant Judgement: Impaired Orientation: Person;Place;Time;Situation Obsessive Compulsive Thoughts/Behaviors: None  Cognitive Functioning Concentration: Decreased Memory: Recent Impaired;Remote Impaired IQ: Average Insight: Fair Impulse Control: Poor Appetite: Poor Weight Loss: 0  Weight Gain: 0  Sleep: Decreased Total Hours of Sleep:  (Unknown) Vegetative Symptoms: None  ADLScreening [facility] Assessment Services) Patient's cognitive ability adequate to safely complete daily activities?: Yes Patient able to express need for assistance with ADLs?: Yes Independently performs ADLs?: Yes (appropriate for developmental age)  Abuse/Neglect [facility]) Physical Abuse: Denies Verbal Abuse: Denies Sexual Abuse: Denies  Prior Inpatient Therapy Prior Inpatient Therapy: No  Prior Outpatient Therapy Prior Outpatient Therapy: No  ADL Screening (condition at time of admission) Patient's cognitive ability adequate to safely complete daily  activities?: Yes Patient able to express need for assistance with ADLs?: Yes Independently performs ADLs?: Yes (appropriate for developmental age) Weakness of Legs: None Weakness of Arms/Hands: None  Home Assistive Devices/Equipment Home Assistive Devices/Equipment: CBG Meter  Therapy Consults (therapy consults require a physician order) PT Evaluation Needed: No OT Evalulation Needed: No SLP Evaluation Needed: No Abuse/Neglect Assessment (Assessment to be complete while patient is alone) Physical Abuse: Denies Verbal Abuse: Denies Sexual Abuse: Denies Exploitation of patient/patient's resources: Denies Self-Neglect: Denies Values / Beliefs Cultural Requests During Hospitalization: None Spiritual Requests During Hospitalization: None Consults Spiritual Care Consult Needed: No Social Work Consult Needed: No Regulatory affairs officer (For Healthcare) Advance Directive: Patient does not have advance directive Pre-existing out of facility DNR order (yellow form or pink MOST form): No Nutrition Screen- [facility] Patient's home diet: Regular Have you recently lost weight without trying?: Patient is unsure Have you been eating poorly because of a decreased appetite?: Yes Malnutrition Screening Tool Score: 3   Additional Information 1:1 In Past 12 Months?: No CIRT Risk: No Elopement Risk: No Does patient have medical clearance?: Yes     Disposition:  Disposition Disposition of Patient: Referred to (Pending Telepsych) Patient referred to: Other (Comment) (Pending Telepsych)  On Site Evaluation by:   Reviewed with Physician:     Milagros Evener 10/20/2011 12:07 PM

## 2011-10-20 NOTE — ED Provider Notes (Signed)
  2:41 PM Patient signed out to me by Dr. Darl Householder and has been evaluated by the telemetry psychiatrist. Patient notes that she has had about 2-3 hours of sleep per night for the past week and this could have contributed to her delirium state. She did have a period of sleep. For about 2 hours and she feels much better. Patients delirium has resolved at this point was likely from her Elavil that she was taking at night. No acute psychiatric emergency at this time. Patient is alert and oriented x4 her. Her son feels that she is at her baseline. Will start her on Seroquel per the psychiatrist's recommendation.  Leota Jacobsen, MD 10/20/11 [phone]

## 2011-11-16 DIAGNOSIS — IMO0001 Reserved for inherently not codable concepts without codable children: Secondary | ICD-10-CM | POA: Diagnosis not present

## 2011-11-16 DIAGNOSIS — IMO0002 Reserved for concepts with insufficient information to code with codable children: Secondary | ICD-10-CM | POA: Diagnosis not present

## 2011-11-16 DIAGNOSIS — M1990 Unspecified osteoarthritis, unspecified site: Secondary | ICD-10-CM | POA: Diagnosis not present

## 2011-11-23 DIAGNOSIS — Z124 Encounter for screening for malignant neoplasm of cervix: Secondary | ICD-10-CM | POA: Diagnosis not present

## 2011-11-23 DIAGNOSIS — A630 Anogenital (venereal) warts: Secondary | ICD-10-CM | POA: Diagnosis not present

## 2011-11-23 DIAGNOSIS — Z01419 Encounter for gynecological examination (general) (routine) without abnormal findings: Secondary | ICD-10-CM | POA: Diagnosis not present

## 2011-11-23 DIAGNOSIS — B079 Viral wart, unspecified: Secondary | ICD-10-CM | POA: Diagnosis not present

## 2011-11-25 DIAGNOSIS — E119 Type 2 diabetes mellitus without complications: Secondary | ICD-10-CM | POA: Diagnosis not present

## 2011-11-25 DIAGNOSIS — E7800 Pure hypercholesterolemia, unspecified: Secondary | ICD-10-CM | POA: Diagnosis not present

## 2011-11-25 DIAGNOSIS — E559 Vitamin D deficiency, unspecified: Secondary | ICD-10-CM | POA: Diagnosis not present

## 2011-12-01 DIAGNOSIS — Z23 Encounter for immunization: Secondary | ICD-10-CM | POA: Diagnosis not present

## 2011-12-01 DIAGNOSIS — E119 Type 2 diabetes mellitus without complications: Secondary | ICD-10-CM | POA: Diagnosis not present

## 2011-12-01 DIAGNOSIS — I10 Essential (primary) hypertension: Secondary | ICD-10-CM | POA: Diagnosis not present

## 2011-12-01 DIAGNOSIS — E7800 Pure hypercholesterolemia, unspecified: Secondary | ICD-10-CM | POA: Diagnosis not present

## 2011-12-02 DIAGNOSIS — A630 Anogenital (venereal) warts: Secondary | ICD-10-CM | POA: Diagnosis not present

## 2011-12-16 DIAGNOSIS — B079 Viral wart, unspecified: Secondary | ICD-10-CM | POA: Diagnosis not present

## 2011-12-21 DIAGNOSIS — D1739 Benign lipomatous neoplasm of skin and subcutaneous tissue of other sites: Secondary | ICD-10-CM | POA: Diagnosis not present

## 2011-12-21 DIAGNOSIS — L658 Other specified nonscarring hair loss: Secondary | ICD-10-CM | POA: Diagnosis not present

## 2011-12-21 DIAGNOSIS — D239 Other benign neoplasm of skin, unspecified: Secondary | ICD-10-CM | POA: Diagnosis not present

## 2011-12-21 DIAGNOSIS — L821 Other seborrheic keratosis: Secondary | ICD-10-CM | POA: Diagnosis not present

## 2011-12-27 DIAGNOSIS — H0019 Chalazion unspecified eye, unspecified eyelid: Secondary | ICD-10-CM | POA: Diagnosis not present

## 2012-01-03 DIAGNOSIS — A630 Anogenital (venereal) warts: Secondary | ICD-10-CM | POA: Diagnosis not present

## 2012-01-17 DIAGNOSIS — B079 Viral wart, unspecified: Secondary | ICD-10-CM | POA: Diagnosis not present

## 2012-05-14 ENCOUNTER — Emergency Department (HOSPITAL_COMMUNITY): Payer: Medicare Other

## 2012-05-14 ENCOUNTER — Encounter (HOSPITAL_COMMUNITY): Payer: Self-pay | Admitting: Emergency Medicine

## 2012-05-14 ENCOUNTER — Emergency Department (HOSPITAL_COMMUNITY)
Admission: EM | Admit: 2012-05-14 | Discharge: 2012-05-14 | Disposition: A | Discharge status: Home / Self Care | Payer: Medicare Other | Attending: Emergency Medicine | Admitting: Emergency Medicine

## 2012-05-14 DIAGNOSIS — G4700 Insomnia, unspecified: Secondary | ICD-10-CM | POA: Diagnosis not present

## 2012-05-14 DIAGNOSIS — Z862 Personal history of diseases of the blood and blood-forming organs and certain disorders involving the immune mechanism: Secondary | ICD-10-CM | POA: Insufficient documentation

## 2012-05-14 DIAGNOSIS — Z8739 Personal history of other diseases of the musculoskeletal system and connective tissue: Secondary | ICD-10-CM | POA: Insufficient documentation

## 2012-05-14 DIAGNOSIS — I498 Other specified cardiac arrhythmias: Secondary | ICD-10-CM | POA: Diagnosis not present

## 2012-05-14 DIAGNOSIS — Z87440 Personal history of urinary (tract) infections: Secondary | ICD-10-CM | POA: Diagnosis not present

## 2012-05-14 DIAGNOSIS — Z8639 Personal history of other endocrine, nutritional and metabolic disease: Secondary | ICD-10-CM | POA: Insufficient documentation

## 2012-05-14 DIAGNOSIS — I712 Thoracic aortic aneurysm, without rupture: Secondary | ICD-10-CM | POA: Diagnosis not present

## 2012-05-14 DIAGNOSIS — IMO0002 Reserved for concepts with insufficient information to code with codable children: Secondary | ICD-10-CM | POA: Diagnosis not present

## 2012-05-14 DIAGNOSIS — E119 Type 2 diabetes mellitus without complications: Secondary | ICD-10-CM | POA: Insufficient documentation

## 2012-05-14 DIAGNOSIS — R4182 Altered mental status, unspecified: Secondary | ICD-10-CM | POA: Diagnosis not present

## 2012-05-14 DIAGNOSIS — I77810 Thoracic aortic ectasia: Secondary | ICD-10-CM | POA: Diagnosis not present

## 2012-05-14 DIAGNOSIS — T50905A Adverse effect of unspecified drugs, medicaments and biological substances, initial encounter: Secondary | ICD-10-CM

## 2012-05-14 DIAGNOSIS — M129 Arthropathy, unspecified: Secondary | ICD-10-CM | POA: Insufficient documentation

## 2012-05-14 DIAGNOSIS — Z79899 Other long term (current) drug therapy: Secondary | ICD-10-CM | POA: Insufficient documentation

## 2012-05-14 DIAGNOSIS — Z7982 Long term (current) use of aspirin: Secondary | ICD-10-CM | POA: Diagnosis not present

## 2012-05-14 DIAGNOSIS — I10 Essential (primary) hypertension: Secondary | ICD-10-CM | POA: Diagnosis not present

## 2012-05-14 DIAGNOSIS — Z8719 Personal history of other diseases of the digestive system: Secondary | ICD-10-CM | POA: Insufficient documentation

## 2012-05-14 LAB — URINALYSIS, ROUTINE W REFLEX MICROSCOPIC
Bilirubin Urine: NEGATIVE
Glucose, UA: NEGATIVE mg/dL
Nitrite: NEGATIVE
Specific Gravity, Urine: 1.013 (ref 1.005–1.030)
pH: 6 (ref 5.0–8.0)

## 2012-05-14 LAB — CBC WITH DIFFERENTIAL/PLATELET
Eosinophils Absolute: 0 10*3/uL (ref 0.0–0.7)
Eosinophils Relative: 0 % (ref 0–5)
HCT: 40.3 % (ref 36.0–46.0)
Hemoglobin: 13.5 g/dL (ref 12.0–15.0)
Lymphocytes Relative: 13 % (ref 12–46)
Lymphs Abs: 0.9 10*3/uL (ref 0.7–4.0)
MCH: 32.2 pg (ref 26.0–34.0)
MCV: 96.2 fL (ref 78.0–100.0)
Monocytes Absolute: 0.6 10*3/uL (ref 0.1–1.0)
Monocytes Relative: 8 % (ref 3–12)
Platelets: 226 10*3/uL (ref 150–400)
RBC: 4.19 MIL/uL (ref 3.87–5.11)
WBC: 7.4 10*3/uL (ref 4.0–10.5)

## 2012-05-14 LAB — COMPREHENSIVE METABOLIC PANEL
ALT: 21 U/L (ref 0–35)
BUN: 20 mg/dL (ref 6–23)
CO2: 22 mEq/L (ref 19–32)
Calcium: 9.9 mg/dL (ref 8.4–10.5)
Creatinine, Ser: 0.92 mg/dL (ref 0.50–1.10)
GFR calc Af Amer: 66 mL/min — ABNORMAL LOW (ref >90)
GFR calc non Af Amer: 57 mL/min — ABNORMAL LOW (ref >90)
Glucose, Bld: 166 mg/dL — ABNORMAL HIGH (ref 70–99)
Sodium: 137 mEq/L (ref 135–145)
Total Protein: 7.9 g/dL (ref 6.0–8.3)

## 2012-05-14 LAB — PROTIME-INR: INR: 1.04 (ref 0.00–1.49)

## 2012-05-14 LAB — RAPID URINE DRUG SCREEN, HOSP PERFORMED
Barbiturates: NOT DETECTED
Benzodiazepines: NOT DETECTED
Cocaine: NOT DETECTED

## 2012-05-14 LAB — AMMONIA: Ammonia: 20 umol/L (ref 11–60)

## 2012-05-14 LAB — TROPONIN I: Troponin I: <0.3 ng/mL (ref <0.30)

## 2012-05-14 LAB — LACTIC ACID, PLASMA: Lactic Acid, Venous: 1.5 mmol/L (ref 0.5–2.2)

## 2012-05-14 MED ORDER — SODIUM CHLORIDE 0.9 % IV SOLN
Freq: Once | INTRAVENOUS | Status: AC
  Administered 2012-05-14: 11:00:00 via INTRAVENOUS

## 2012-05-14 MED ORDER — LORAZEPAM 2 MG/ML IJ SOLN
0.5000 mg | Freq: Once | INTRAMUSCULAR | Status: AC
  Administered 2012-05-14: 0.5 mg via INTRAVENOUS
  Filled 2012-05-14: qty 1

## 2012-05-14 MED ORDER — TRAZODONE HCL 50 MG PO TABS: 50.0000 mg | ORAL_TABLET | Freq: Every day | ORAL | Status: DC

## 2012-05-14 MED ORDER — IOHEXOL 350 MG/ML SOLN
100.0000 mL | Freq: Once | INTRAVENOUS | Status: AC | PRN
  Administered 2012-05-14: 100 mL via INTRAVENOUS

## 2012-05-14 NOTE — ED Notes (Signed)
Pt was asleep immediately prior to tele psych. Pt had to be awakened from a sound sleep of about 30 minutes and was repositioned in the bed.

## 2012-05-14 NOTE — ED Notes (Signed)
Discharge instructions reviewed w/ pt., and son who both verbalize understanding. One prescription provided at discharge.

## 2012-05-14 NOTE — ED Notes (Signed)
TelePsych notified of order for pt, spoke w/ Roderic Palau who indicated there are 3 orders in front of this pt so it will be around 1.5 hrs before this pt is done. Son informed of same.

## 2012-05-14 NOTE — ED Provider Notes (Signed)
Of insulin she is History     CSN: [id number]  Arrival date & time 05/14/12  [id number]   First MD Initiated Contact with Patient 05/14/12 [phone]      Chief Complaint  Patient presents with  . Altered Mental Status    (Consider location/radiation/quality/duration/timing/severity/associated sxs/prior treatment) HPI Comments: Patient presents with her son with altered mental status and agitation for the past 5 days. She has been "talking out of her head" and not sleeping and becoming agitated and having rambling thoughts. He reports similar issue last September which was thought to be due to medication. She has not been violent. She has been eating and drinking well. There've been no recent medication changes. She's had no fever, chills, cough, abdominal pain nausea or vomiting.  Level V caveat applies for altered mental status  The history is provided by the patient and a relative. The history is limited by the condition of the patient.    Past Medical History  Diagnosis Date  . Diabetes mellitus   . Hypertension   . H/O: gout   . Fibromyalgia   . Constipation   . Hx: UTI (urinary tract infection)   . Arthritis     Past Surgical History  Procedure Laterality Date  . Knee surgery    . Replacement total hip w/  resurfacing implants    . Spinal fusion    . Joint replacement  left hip-2007, left knee-2000, right knee-1997    Left Hip, Bilateral Knee replacements  . Back surgery  1972  . Closed reduction of left total hip      x 3  . Right foot surgery      x 2  . Appendectomy    . Cholecystectomy    . Hernia repair    . Salivary gland stone extraction      No family history on file.  History  Substance Use Topics  . Smoking status: Never Smoker   . Smokeless tobacco: Never Used  . Alcohol Use: No    OB History   Grav Para Term Preterm Abortions TAB SAB Ect Mult Living                  Review of Systems  Unable to perform ROS: Mental status change    Allergies   Codeine; Morphine and related; and Ultram  Home Medications   Current Outpatient Rx  Name  Route  Sig  Dispense  Refill  . amitriptyline (ELAVIL) 25 MG tablet   Oral   Take 50 mg by mouth at bedtime.         Marland Kitchen aspirin 81 MG tablet   Oral   Take 81 mg by mouth daily.           . calcium carbonate (OS-CAL) 1250 MG chewable tablet   Oral   Chew 2 tablets by mouth 2 (two) times daily.         Marland Kitchen docusate sodium (COLACE) 100 MG capsule   Oral   Take 100 mg by mouth daily.         . magnesium oxide (MAG-OX) 400 MG tablet   Oral   Take 400 mg by mouth daily.         . meloxicam (MOBIC) 7.5 MG tablet   Oral   Take 7.5 mg by mouth daily.           . metFORMIN (GLUCOPHAGE) 500 MG tablet   Oral   Take 500 mg by mouth 2 (two)  times daily with a meal.           . multivitamin-lutein (OCUVITE-LUTEIN) CAPS   Oral   Take 1 capsule by mouth daily.         Marland Kitchen omeprazole (PRILOSEC) 20 MG capsule   Oral   Take 20 mg by mouth daily.           . potassium chloride SA (K-DUR,KLOR-CON) 20 MEQ tablet   Oral   Take 20 mEq by mouth 2 (two) times daily.          . simvastatin (ZOCOR) 20 MG tablet   Oral   Take 20 mg by mouth at bedtime.           . sitaGLIPtin (JANUVIA) 100 MG tablet   Oral   Take 100 mg by mouth daily.         . timolol (TIMOPTIC) 0.25 % ophthalmic solution      1 drop 2 (two) times daily.           Marland Kitchen EXPIRED: QUEtiapine (SEROQUEL) 50 MG tablet   Oral   Take 1 tablet (50 mg total) by mouth at bedtime.   20 tablet   0     BP 179/80  Pulse 98  Temp(Src) 98.6 F (37 C) (Oral)  Resp 17  SpO2 99%  Physical Exam  Constitutional: She is oriented to person, place, and time. She appears well-developed and well-nourished. No distress.  Agitated, rambling speech Keeps repeating "it's a lie".  HENT:  Head: Normocephalic and atraumatic.  Mouth/Throat: Oropharynx is clear and moist. No oropharyngeal exudate.  Eyes: Conjunctivae and EOM  are normal. Pupils are equal, round, and reactive to light.  Neck: Normal range of motion. Neck supple.  Cardiovascular: Normal rate, regular rhythm and normal heart sounds.   Pulmonary/Chest: Effort normal and breath sounds normal. No respiratory distress.  Abdominal: Soft. There is no tenderness. There is no rebound and no guarding.  Musculoskeletal: Normal range of motion. She exhibits no edema and no tenderness.  Neurological: She is alert and oriented to person, place, and time. No cranial nerve deficit. She exhibits normal muscle tone. Coordination normal.  Skin: Skin is warm.    ED Course  Procedures (including critical care time)  Labs Reviewed  CBC WITH DIFFERENTIAL - Abnormal; Notable for the following:    Neutrophils Relative 79 (*)    All other components within normal limits  COMPREHENSIVE METABOLIC PANEL - Abnormal; Notable for the following:    Potassium 3.3 (*)    Glucose, Bld 166 (*)    GFR calc non Af Amer 57 (*)    GFR calc Af Amer 66 (*)    All other components within normal limits  URINALYSIS, ROUTINE W REFLEX MICROSCOPIC - Abnormal; Notable for the following:    Hgb urine dipstick TRACE (*)    Ketones, ur 40 (*)    Protein, ur 30 (*)    Leukocytes, UA TRACE (*)    All other components within normal limits  URINE MICROSCOPIC-ADD ON - Abnormal; Notable for the following:    Casts HYALINE CASTS (*)    All other components within normal limits  TROPONIN I  URINE RAPID DRUG SCREEN (HOSP PERFORMED)  PROTIME-INR  AMMONIA  LACTIC ACID, PLASMA   Dg Chest 2 View  05/14/2012  *RADIOLOGY REPORT*  Clinical Data: Altered mental status, hypertension  CHEST - 2 VIEW  Comparison: 10/19/2011  Findings: Tortuous dilated atheromatous thoracic aorta, proximal descending measuring up to 4.7 cm  diameter.  Heart size normal. Lungs clear.  No effusion.  Lumbar fixation hardware partially seen.  IMPRESSION:  1.  Thoracic aortic aneurysm without evident complicating features. If  there is clinical concern of dissection, consider CTA for further evaluation.   Original Report Authenticated By: D. Wallace Going, MD    Ct Head Wo Contrast  05/14/2012  *RADIOLOGY REPORT*  Clinical Data: Altered mental status.  Diabetic hypertensive patient.  CT HEAD WITHOUT CONTRAST  Technique:  Contiguous axial images were obtained from the base of the skull through the vertex without contrast.  Comparison: 10/19/2011.  Findings: No intracranial hemorrhage.  No CT evidence of large acute infarct.  Global atrophy without hydrocephalus.  No intracranial mass lesion detected on this unenhanced exam.  Vascular calcifications.  Small left frontal calvarial anteriorly directed exostosis unchanged.  Partial opacification inferior aspect right mastoid air cells.  Visualized orbital structures unremarkable.  IMPRESSION: No intracranial hemorrhage or CT evidence of large acute infarct. Please see above.   Original Report Authenticated By: Genia Del, M.D.    Ct Angio Chest Aortic Dissect W &/or W/o  05/14/2012  *RADIOLOGY REPORT*  Clinical Data: Altered mental status.  Back pain.  Tortuous and ectatic thoracic aorta.  Palpitations.  CT ANGIOGRAPHY CHEST  Technique:  Multidetector CT imaging of the chest using the standard protocol during bolus administration of intravenous contrast. Multiplanar reconstructed images including MIPs were obtained and reviewed to evaluate the vascular anatomy.  Contrast: 164mL OMNIPAQUE IOHEXOL 350 MG/ML SOLN  Comparison: Multiple exams, including 05/14/2012  Findings: Initial noncontrast images demonstrate atherosclerotic calcification of the thoracic aorta, branch vessels, and coronary arteries.  Punctate calcifications in the liver and spleen suggest remote granulomatous disease.  Prominent glenohumeral degenerative arthropathy noted.  Arterial phase images demonstrate no evidence of dissection.  Wall thickening associated with the atherosclerotic calcification noted, particularly in  the descending thoracic aorta.  There is aortic tortuosity.  The proximal ascending thoracic aorta 4.2 cm; distal ascending thoracic aorta 4.1 cm; mid arch diameter 3.3 cm; the upper descending thoracic aorta 3.7 cm; mid thoracic descending aorta 3.6 cm; at the hiatus the aorta measures 3.8 cm in diameter. There is likely some minimal mural thrombus along the left margin of the descending thoracic aorta.  Common bile duct 1.8 cm.  Minimal intrahepatic biliary dilatation. By report the patient is had prior cholecystectomy.  No pathologic thoracic adenopathy.  There is a suggestion of mid esophageal wall thickening.  Thoracic spondylosis and lower thoracic degenerative disc disease noted.  Mild right apical pleuroparenchymal scarring.  Mild scarring medially in the right upper lobe.  Dependent subsegmental atelectasis noted in the right lower lobe.  IMPRESSION:  1.  Ectatic and tortuous thoracic aorta, without dissection. Atherosclerotic calcification noted involving the thoracic alignment, coronary arteries, and to a lesser extent the branch vessels. 2.  Biliary dilatation.  Some of this may be air response cholecystectomy.  There to suspected mid thoracic esophageal mild wall thickening, query esophagitis. 3.  Degenerative glenohumeral arthropathy, with suspected shoulder joint effusions. 4.  Thoracic spondylosis and degenerative disc disease.   Original Report Authenticated By: Van Clines, M.D.      No diagnosis found.    MDM  Rambling speech, agitation, insomnia for the past several days. No focal or neuro deficits. No recent medication changes. Son reports similar episode last September with patient was not sleeping well either.  Pursue metabolic and infectious workup with labs, CT, x-rays.  Dilated thoracic aorta on chest x-ray. No pneumonia. Urinalysis negative.  Labs unremarkable. Family aware of thoracic aortic aneurysm. No evidence of dissection.  Patient was given a small dose of IV  Ativan for agitation and then she went to sleep. Upon psychiatric assessment, her mental status had become clear and coherent. She is oriented x3 denies suicidal or homicidal ideation. No hallucinations. Her son states she is better than her baseline. Discussed with Dr. Philis Pique and will discontinue amitriptyline and start trazodone.  Patient states she is feeling much better and requesting discharge. Medication adjustments as above. Followup with PCP.  Date: 05/14/2012  Rate: 103  Rhythm: sinus tachycardia  QRS Axis: normal  Intervals: normal  ST/T Wave abnormalities: nonspecific ST/T changes  Conduction Disutrbances:none  Narrative Interpretation:   Old EKG Reviewed: unchanged      Ezequiel Essex, MD 05/14/12 [phone]

## 2012-05-14 NOTE — BH Assessment (Signed)
Assessment Note   Karen [name] is an 79 y.o. female.   Pt does not appear to have [place] issues.  Pt issue seems to be lack of sleep (hasn't slept in 5 days), medication issue, organic (although testing appears to be appropriate).  Pt was in ED in Sept of 2013 for similar issue and was medically admitted.  Once pt slept and hydrated pt was discharged home and pt son reports "she was fine."  Pt speaks in random sentences, contraindicates anything she speaks, questions everything, demonstrates paranoid behaviors, but reports "I know that is delusional and not right, right?"  Per son, pt is not suicidal, homicidal, no hx of psychosis related to [place], no SA issues.  Pt had relative with alzheimer's.  Son describes mother (pt) as "she is smart, still has her mind, great historian, family comes to her for advice, she is funny, and good company."  MHE by ACT:  Good eye contact, reports lack of hydration, son moistens her lips and gives sips of water, no sleep in 5 days, not oriented, judgement impairment based on cognition, ADLs may be impaired by lack of cognitive stability but reports by son at home is pt can do her ADLs.  Recommendation:  Pt needs to be treated medically and not for MH related services.  Inptx is not advisable based on what ACT observed and assessed.  MD are the experts and will determine dispo.  Pt needs sleep and hydration in ACT opinion.  Axis I: See current hospital problem list Axis II: Deferred Axis III:  Past Medical History  Diagnosis Date  . Diabetes mellitus   . Hypertension   . H/O: gout   . Fibromyalgia   . Constipation   . Hx: UTI (urinary tract infection)   . Arthritis    Axis IV: other psychosocial or environmental problems Axis V: 31-40 impairment in reality testing  Past Medical History:  Past Medical History  Diagnosis Date  . Diabetes mellitus   . Hypertension   . H/O: gout   . Fibromyalgia   . Constipation   . Hx: UTI (urinary tract infection)    . Arthritis     Past Surgical History  Procedure Laterality Date  . Knee surgery    . Replacement total hip w/  resurfacing implants    . Spinal fusion    . Joint replacement  left hip-2007, left knee-2000, right knee-1997    Left Hip, Bilateral Knee replacements  . Back surgery  1972  . Closed reduction of left total hip      x 3  . Right foot surgery      x 2  . Appendectomy    . Cholecystectomy    . Hernia repair    . Salivary gland stone extraction      Family History: No family history on file.  Social History:  reports that she has never smoked. She has never used smokeless tobacco. She reports that she does not drink alcohol or use illicit drugs.  Additional Social History:  Alcohol / Drug Use Pain Medications: na Prescriptions: na Over the Counter: na History of alcohol / drug use?: No history of alcohol / drug abuse Longest period of sobriety (when/how long): na  CIWA: CIWA-Ar BP: 191/77 mmHg Pulse Rate: 96 COWS:    Allergies:  Allergies  Allergen Reactions  . Codeine Other (See Comments)    Unknown   . Morphine And Related Other (See Comments)    Unknown   .  Ultram (Tramadol Hcl) Itching    Home Medications:  (Not in a hospital admission)  OB/GYN Status:  No LMP recorded. Patient is postmenopausal.  General Assessment Data Location of Assessment: [facility] ED Living Arrangements: Children Can pt return to current living arrangement?: Yes Admission Status: Voluntary Is patient capable of signing voluntary admission?: Yes Transfer from: [facility] Referral Source: MD  Education Status Is patient currently in school?: No  Risk to self Suicidal Ideation: No Suicidal Intent: No Is patient at risk for suicide?: No Suicidal Plan?: No Access to Means: No What has been your use of drugs/alcohol within the last 12 months?: none Previous Attempts/Gestures: No How many times?: 0 Other Self Harm Risks: none Triggers for Past Attempts: None  known Intentional Self Injurious Behavior: None Family Suicide History: No Persecutory voices/beliefs?: No Depression: No Substance abuse history and/or treatment for substance abuse?: No Suicide prevention information given to non-admitted patients: Not applicable  Risk to Others Homicidal Ideation: No Thoughts of Harm to Others: No Current Homicidal Intent: No Current Homicidal Plan: No Access to Homicidal Means: No Identified Victim: na History of harm to others?: No Assessment of Violence: None Noted Violent Behavior Description: cooperative Does patient have access to weapons?: No Criminal Charges Pending?: No Does patient have a court date: No  Psychosis Hallucinations: None noted Delusions: Unspecified (does not appear to be mental health related)  Mental Status Report Appear/Hygiene: Disheveled Eye Contact: Good Motor Activity: Restlessness Speech: Word salad;Tangential Level of Consciousness: Alert Mood: Anxious;Labile;Suspicious;Angry;Ashamed/humiliated;Guilty;Sad;Worthless, low self-esteem;Terrified Affect: Anxious;Labile Anxiety Level: Panic Attacks Panic attack frequency: daily Most recent panic attack: 05-13-12 Thought Processes: Flight of Ideas;Tangential;Irrelevant Judgement: Impaired Orientation: Not oriented Obsessive Compulsive Thoughts/Behaviors: Minimal  Cognitive Functioning Concentration: Decreased Memory: Recent Impaired;Remote Intact IQ: Average Insight: Poor Impulse Control: Poor Appetite: Fair Weight Loss: 0 Weight Gain: 0 Sleep: Decreased Total Hours of Sleep: 0 (no sleep in 5 days per son and pt) Vegetative Symptoms: None  ADLScreening [facility] Assessment Services) Patient's cognitive ability adequate to safely complete daily activities?: No (pt hasn't slept in 5 days.  Needs to be stable) Patient able to express need for assistance with ADLs?: No Independently performs ADLs?:  (Not stable due to sleep deprivation. Needs  assistance)  Abuse/Neglect [facility]) Physical Abuse: Denies Verbal Abuse: Denies Sexual Abuse: Denies  Prior Inpatient Therapy Prior Inpatient Therapy: No Prior Therapy Dates: na Prior Therapy Facilty/Provider(s): na Reason for Treatment: na  Prior Outpatient Therapy Prior Outpatient Therapy: No Prior Therapy Dates: na Prior Therapy Facilty/Provider(s): na Reason for Treatment: na  ADL Screening (condition at time of admission) Patient's cognitive ability adequate to safely complete daily activities?: No (pt hasn't slept in 5 days.  Needs to be stable) Patient able to express need for assistance with ADLs?: No Independently performs ADLs?:  (Not stable due to sleep deprivation. Needs assistance)       Abuse/Neglect Assessment (Assessment to be complete while patient is alone) Physical Abuse: Denies Verbal Abuse: Denies Sexual Abuse: Denies Exploitation of patient/patient's resources: Denies Self-Neglect: Denies Values / Beliefs Cultural Requests During Hospitalization: None Spiritual Requests During Hospitalization: None Consults Spiritual Care Consult Needed: No Social Work Consult Needed: No      Additional Information 1:1 In Past 12 Months?: No CIRT Risk: No Elopement Risk: No Does patient have medical clearance?: No     Disposition: Recommendation:  Pt needs to be treated medically and not for MH related services.  Inptx is not advisable based on what ACT observed and assessed.  MD are the  experts and will determine dispo.  Pt needs sleep and hydration in ACT opinion.  If Tele psych and MD determine MH and want ACT to consider placement then ACT will do as directed based on hospital protocol.  Disposition Initial Assessment Completed for this Encounter: Yes Disposition of Patient: Referred to (pt doesn't appear to have [place] issue; organic / sleep /dehyd) Patient referred to: Other (Comment) (tele pscyh; possible hospital admit?)  On Site Evaluation by:    Reviewed with Physician:     Orlan Leavens, Graciela Husbands 05/14/2012 12:16 PM

## 2012-05-14 NOTE — ED Notes (Signed)
Tele-psych consult in process.

## 2012-05-14 NOTE — ED Notes (Signed)
Patient transported to CT 

## 2012-05-14 NOTE — ED Notes (Signed)
Pt continues to have non-stop conversation with someone I didn't send any Christmas cards 2 years in a row, no that's not the truth but I think I am lying. I told my son the other day about termites in a building and I think i am lying. I tried to convince everybody I was crazy, but I didn't, oh, no I tried to lie so it must be truth. You see it must be true. Something is trying to come to me, Salome Spotted to me the other day. It's the truth but I am lying, this thing will clinch, but I am not sure, you know my lips are terrible, but I know they're not. I've bought stuff and I can't even wear, and you know that's a lie, I needed it and I wanted it. But everybody knows that's a lie"  Pt continues to talk in circles.

## 2012-05-14 NOTE — ED Notes (Signed)
Son states pt has not been sleeping past several days and is having none stop conversations w/ herself. Pt is clearly agitated, upset, and will say "I know that I am not sick but that a lie, I sure that is a lie, and that's the honest truth."

## 2012-05-15 DIAGNOSIS — F3013 Manic episode, severe, without psychotic symptoms: Secondary | ICD-10-CM | POA: Diagnosis not present

## 2012-05-15 DIAGNOSIS — F411 Generalized anxiety disorder: Secondary | ICD-10-CM | POA: Diagnosis not present

## 2012-05-16 ENCOUNTER — Telehealth (HOSPITAL_COMMUNITY): Payer: Self-pay

## 2012-05-16 ENCOUNTER — Ambulatory Visit (HOSPITAL_COMMUNITY): Admission: RE | Admit: 2012-05-16 | Payer: Self-pay | Source: Home / Self Care | Admitting: Psychiatry

## 2012-05-16 ENCOUNTER — Encounter (HOSPITAL_COMMUNITY): Payer: Self-pay | Admitting: Emergency Medicine

## 2012-05-16 ENCOUNTER — Emergency Department (HOSPITAL_COMMUNITY)
Admission: EM | Admit: 2012-05-16 | Discharge: 2012-05-16 | Disposition: A | Discharge status: Other Acute Inpatient Hospital | Payer: Medicare Other | Attending: Emergency Medicine | Admitting: Emergency Medicine

## 2012-05-16 DIAGNOSIS — E118 Type 2 diabetes mellitus with unspecified complications: Secondary | ICD-10-CM | POA: Diagnosis present

## 2012-05-16 DIAGNOSIS — Z7982 Long term (current) use of aspirin: Secondary | ICD-10-CM | POA: Insufficient documentation

## 2012-05-16 DIAGNOSIS — R4182 Altered mental status, unspecified: Secondary | ICD-10-CM | POA: Diagnosis present

## 2012-05-16 DIAGNOSIS — F411 Generalized anxiety disorder: Secondary | ICD-10-CM | POA: Insufficient documentation

## 2012-05-16 DIAGNOSIS — M25519 Pain in unspecified shoulder: Secondary | ICD-10-CM | POA: Diagnosis not present

## 2012-05-16 DIAGNOSIS — R443 Hallucinations, unspecified: Secondary | ICD-10-CM | POA: Diagnosis not present

## 2012-05-16 DIAGNOSIS — IMO0001 Reserved for inherently not codable concepts without codable children: Secondary | ICD-10-CM | POA: Diagnosis present

## 2012-05-16 DIAGNOSIS — Z8719 Personal history of other diseases of the digestive system: Secondary | ICD-10-CM | POA: Insufficient documentation

## 2012-05-16 DIAGNOSIS — E119 Type 2 diabetes mellitus without complications: Secondary | ICD-10-CM | POA: Insufficient documentation

## 2012-05-16 DIAGNOSIS — R079 Chest pain, unspecified: Secondary | ICD-10-CM | POA: Diagnosis not present

## 2012-05-16 DIAGNOSIS — M1990 Unspecified osteoarthritis, unspecified site: Secondary | ICD-10-CM | POA: Diagnosis not present

## 2012-05-16 DIAGNOSIS — I10 Essential (primary) hypertension: Secondary | ICD-10-CM | POA: Diagnosis present

## 2012-05-16 DIAGNOSIS — F329 Major depressive disorder, single episode, unspecified: Secondary | ICD-10-CM | POA: Diagnosis present

## 2012-05-16 DIAGNOSIS — IMO0002 Reserved for concepts with insufficient information to code with codable children: Secondary | ICD-10-CM | POA: Diagnosis not present

## 2012-05-16 DIAGNOSIS — F29 Unspecified psychosis not due to a substance or known physiological condition: Secondary | ICD-10-CM

## 2012-05-16 DIAGNOSIS — Z79899 Other long term (current) drug therapy: Secondary | ICD-10-CM | POA: Insufficient documentation

## 2012-05-16 DIAGNOSIS — M109 Gout, unspecified: Secondary | ICD-10-CM | POA: Diagnosis not present

## 2012-05-16 DIAGNOSIS — F323 Major depressive disorder, single episode, severe with psychotic features: Secondary | ICD-10-CM | POA: Diagnosis not present

## 2012-05-16 DIAGNOSIS — Z87440 Personal history of urinary (tract) infections: Secondary | ICD-10-CM | POA: Insufficient documentation

## 2012-05-16 DIAGNOSIS — Z8739 Personal history of other diseases of the musculoskeletal system and connective tissue: Secondary | ICD-10-CM | POA: Insufficient documentation

## 2012-05-16 DIAGNOSIS — F319 Bipolar disorder, unspecified: Secondary | ICD-10-CM | POA: Diagnosis not present

## 2012-05-16 DIAGNOSIS — E876 Hypokalemia: Secondary | ICD-10-CM | POA: Diagnosis present

## 2012-05-16 LAB — TSH: TSH: 7.03 u[IU]/mL — ABNORMAL HIGH (ref 0.350–4.500)

## 2012-05-16 LAB — CBC
Hemoglobin: 12.2 g/dL (ref 12.0–15.0)
MCH: 32.4 pg (ref 26.0–34.0)
MCHC: 34.3 g/dL (ref 30.0–36.0)
MCV: 94.4 fL (ref 78.0–100.0)
RBC: 3.77 MIL/uL — ABNORMAL LOW (ref 3.87–5.11)

## 2012-05-16 LAB — SALICYLATE LEVEL: Salicylate Lvl: <2 mg/dL — ABNORMAL LOW (ref 2.8–20.0)

## 2012-05-16 LAB — COMPREHENSIVE METABOLIC PANEL
BUN: 22 mg/dL (ref 6–23)
CO2: 22 mEq/L (ref 19–32)
Calcium: 10.1 mg/dL (ref 8.4–10.5)
Creatinine, Ser: 0.97 mg/dL (ref 0.50–1.10)
GFR calc Af Amer: 62 mL/min — ABNORMAL LOW (ref >90)
GFR calc non Af Amer: 53 mL/min — ABNORMAL LOW (ref >90)
Glucose, Bld: 127 mg/dL — ABNORMAL HIGH (ref 70–99)
Total Protein: 7 g/dL (ref 6.0–8.3)

## 2012-05-16 LAB — RAPID URINE DRUG SCREEN, HOSP PERFORMED
Opiates: NOT DETECTED
Tetrahydrocannabinol: NOT DETECTED

## 2012-05-16 LAB — VITAMIN B12: Vitamin B-12: 1192 pg/mL — ABNORMAL HIGH (ref 211–911)

## 2012-05-16 LAB — ETHANOL: Alcohol, Ethyl (B): <11 mg/dL (ref 0–11)

## 2012-05-16 MED ORDER — POTASSIUM CHLORIDE CRYS ER 20 MEQ PO TBCR: 20.0000 meq | EXTENDED_RELEASE_TABLET | Freq: Two times a day (BID) | ORAL | Status: DC

## 2012-05-16 MED ORDER — TIMOLOL MALEATE 0.25 % OP SOLN
1.0000 [drp] | Freq: Two times a day (BID) | OPHTHALMIC | Status: DC
  Filled 2012-05-16: qty 5

## 2012-05-16 MED ORDER — PANTOPRAZOLE SODIUM 40 MG PO TBEC
40.0000 mg | DELAYED_RELEASE_TABLET | Freq: Every day | ORAL | Status: DC
  Administered 2012-05-16: 40 mg via ORAL
  Filled 2012-05-16: qty 1

## 2012-05-16 MED ORDER — POTASSIUM CHLORIDE CRYS ER 20 MEQ PO TBCR
40.0000 meq | EXTENDED_RELEASE_TABLET | Freq: Once | ORAL | Status: AC
  Administered 2012-05-16: 40 meq via ORAL
  Filled 2012-05-16: qty 2

## 2012-05-16 MED ORDER — LINAGLIPTIN 5 MG PO TABS
5.0000 mg | ORAL_TABLET | Freq: Every day | ORAL | Status: DC
  Administered 2012-05-16: 5 mg via ORAL
  Filled 2012-05-16: qty 1

## 2012-05-16 MED ORDER — ASPIRIN 81 MG PO CHEW
81.0000 mg | CHEWABLE_TABLET | Freq: Every day | ORAL | Status: DC
  Administered 2012-05-16: 81 mg via ORAL
  Filled 2012-05-16: qty 1

## 2012-05-16 MED ORDER — ZIPRASIDONE MESYLATE 20 MG IM SOLR
10.0000 mg | Freq: Once | INTRAMUSCULAR | Status: AC
  Administered 2012-05-16: 10 mg via INTRAMUSCULAR
  Filled 2012-05-16: qty 20

## 2012-05-16 MED ORDER — RISPERIDONE 0.5 MG PO TABS
0.5000 mg | ORAL_TABLET | Freq: Two times a day (BID) | ORAL | Status: DC
  Filled 2012-05-16: qty 1

## 2012-05-16 MED ORDER — SIMVASTATIN 20 MG PO TABS
20.0000 mg | ORAL_TABLET | Freq: Every day | ORAL | Status: DC
  Filled 2012-05-16: qty 1

## 2012-05-16 MED ORDER — IBUPROFEN 600 MG PO TABS: 600.0000 mg | ORAL_TABLET | Freq: Three times a day (TID) | ORAL | Status: DC | PRN

## 2012-05-16 MED ORDER — LORAZEPAM 1 MG PO TABS
1.0000 mg | ORAL_TABLET | Freq: Two times a day (BID) | ORAL | Status: DC
  Filled 2012-05-16: qty 1

## 2012-05-16 MED ORDER — DOCUSATE SODIUM 100 MG PO CAPS
100.0000 mg | ORAL_CAPSULE | Freq: Every day | ORAL | Status: DC
  Administered 2012-05-16: 100 mg via ORAL
  Filled 2012-05-16: qty 1

## 2012-05-16 MED ORDER — HALOPERIDOL LACTATE 5 MG/ML IJ SOLN
2.0000 mg | Freq: Once | INTRAMUSCULAR | Status: AC
  Administered 2012-05-16: 2 mg via INTRAMUSCULAR
  Filled 2012-05-16: qty 1

## 2012-05-16 MED ORDER — ACETAMINOPHEN 325 MG PO TABS: 650.0000 mg | ORAL_TABLET | ORAL | Status: DC | PRN

## 2012-05-16 MED ORDER — ONDANSETRON HCL 4 MG PO TABS: 4.0000 mg | ORAL_TABLET | Freq: Three times a day (TID) | ORAL | Status: DC | PRN

## 2012-05-16 MED ORDER — METFORMIN HCL 500 MG PO TABS
500.0000 mg | ORAL_TABLET | Freq: Two times a day (BID) | ORAL | Status: DC
  Administered 2012-05-16: 500 mg via ORAL
  Filled 2012-05-16 (×2): qty 1

## 2012-05-16 MED ORDER — LORAZEPAM 2 MG/ML IJ SOLN: 2.0000 mg | Freq: Once | INTRAMUSCULAR | Status: DC

## 2012-05-16 MED ORDER — CALCIUM CARBONATE 1250 (500 CA) MG PO TABS
2500.0000 mg | ORAL_TABLET | Freq: Two times a day (BID) | ORAL | Status: DC
  Administered 2012-05-16: 1250 mg via ORAL
  Filled 2012-05-16 (×3): qty 2

## 2012-05-16 MED ORDER — OCUVITE-LUTEIN PO CAPS
1.0000 | ORAL_CAPSULE | Freq: Every day | ORAL | Status: DC
  Administered 2012-05-16: 1 via ORAL
  Filled 2012-05-16: qty 1

## 2012-05-16 MED ORDER — LORAZEPAM 2 MG/ML IJ SOLN
1.0000 mg | Freq: Two times a day (BID) | INTRAMUSCULAR | Status: DC
  Administered 2012-05-16: 1 mg via INTRAMUSCULAR
  Filled 2012-05-16: qty 1

## 2012-05-16 MED ORDER — MELOXICAM 7.5 MG PO TABS
7.5000 mg | ORAL_TABLET | Freq: Every day | ORAL | Status: DC
  Administered 2012-05-16: 7.5 mg via ORAL
  Filled 2012-05-16: qty 1

## 2012-05-16 NOTE — Consult Note (Addendum)
[facility] Medical Consultation  LESLIEANNE VOLNER [id number] DOB: Feb 25, 1928 DOA: 05/16/2012 PCP: Tommy Medal, MD   Requesting physician: Dr. Riki Altes, EDP Date of consultation: 05/16/2012 Reason for consultation: Altered Mental Status.  Impression/Recommendations Principal Problem:   Altered mental status Active Problems:   Hallucinations   Hypokalemia   Diabetes mellitus   HTN (hypertension)  Addendum: B 12: 1192 and RPR neg. TSH elevated at 7.030. Clinically she does not look hypothyroid. Recommend rechecking full TFT's in a couple of weeks. TSH may been falsely elevated due to acute illness/stressful situation. Discussed with PCP.   1. Altered Mental Status/?Acute Psychosis: No obvious medical reasons to explain the sudden onset of mental status changes. Extensive workup in the ED in the last 48 hours has been unremarkable. No clinical focus of sepsis or obvious metabolic abnormality. No new medications started prior to her recent episode past Sunday. CT head, chest x-ray, CT chest, UA, ammonia, UDS and blood alcohol levels negative. Will request TSH, B12 and RPR. Recommend psychiatric evaluation and disposition per psychiatry. No clear reasons for medical admission. Left message for Psychiatrist to call back. Patient may need to resume amitriptyline which has to be gradually tapered-will defer to psychiatry. 2. Hypokalemia: By mouth replete and OP followup. Continue home potassium supplements. 3. Diabetes mellitus type 2: Per her PCP, hemoglobin A1 in October was 5.9 indicating good control. Continue home by mouth medications. 4. Hypertension: Controlled. Continue home medications. 5. Ectatic and tortuous thoracic aorta, without dissection on CT chest. OP follow up. Continue home dose of aspirin.  Discussed with PCP Dr. Tommy Medal and discussed with EDP Dr. Riki Altes.  I will not follow again unless need arises. Please contact me if I can be of assistance in the  meanwhile. Thank you for this consultation.  Chief Complaint: Altered Mental Status.  HPI:  79 year old Caucasian female patient with PMH of type II DM, HTN, gout, fibromyalgia presented to the [facility] ED for the second time in 2 days for altered mental status. History is obtained from patient and her son Mr. [place] at bedside. Patient apparently had some self-limiting diarrhea last week which has since resolved. He thinks that she may not have gotten adequate sleep during that time. Past Sunday, patient developed altered mental status-constantly talking, repeating herself, not making much sense and agitation. At that time she did not have any hallucinations. She was seen in the ED on 05/14/12. Workup for metabolic and infectious causes was negative. She was given a small dose of IV Ativan for agitation and patient went to sleep. Once she woke up, upon psychiatric assessment her mental status had significantly improved to baseline. She was advised to discontinue amitriptyline (has been taking for years) and Seroquel (patient denies taking this medication). She was also started on trazodone when necessary. She was seen by her PCP on 05/15/12. He continued to the Seroquel and added when necessary Klonopin. She took these medications last night. Patient's son went to sleep at about 6 PM. At 54 PM he heard knocking on his door. Since then patient again has been talking continuously, repeating herself, has been more agitated and throwing things around and cursing which is unusual for her. This time she has been seeing people from her church/hallucinating. She was unable to sleep all night. Son and family member coaxed her to coming to the ED. Per son, patient had similar episode in September 2013 for which she spent 2 days in the ED, mental status changes resolved  and she was discharged home. She carries a history of? Depression but no anxiety, bipolar disorder or other psychiatric diagnosis. No  history of dementia. Per son, patient usually is sharp with a great memory. Discussed with Dr. Minna Antis who corroborated some of the history. A1c in October was 5.9. No recent TSH done at his office. He agrees that patient ideally needs psychiatric admission and see no role at this time for medical admission. Patient during the interview was actually coherent, oriented and was able to provide detailed medical history but does not seem to remember her mental status changes. She denies suicidal or homicidal ideations.  Review of Systems:  All systems reviewed and apart from history of presenting illness, are negative  Past Medical History  Diagnosis Date  . Diabetes mellitus   . Hypertension   . H/O: gout   . Fibromyalgia   . Constipation   . Hx: UTI (urinary tract infection)   . Arthritis    Past Surgical History  Procedure Laterality Date  . Knee surgery    . Replacement total hip w/  resurfacing implants    . Spinal fusion    . Joint replacement  left hip-2007, left knee-2000, right knee-1997    Left Hip, Bilateral Knee replacements  . Back surgery  1972  . Closed reduction of left total hip      x 3  . Right foot surgery      x 2  . Appendectomy    . Cholecystectomy    . Hernia repair    . Salivary gland stone extraction     Social History:  reports that she has never smoked. She has never used smokeless tobacco. She reports that she does not drink alcohol or use illicit drugs. Patient is widowed. She lives with her son and physically quite active and independent of activities of daily living.  Allergies  Allergen Reactions  . Codeine Other (See Comments)    Unknown   . Morphine And Related Other (See Comments)    Unknown   . Ultram (Tramadol Hcl) Itching   History reviewed. No pertinent family history.  Prior to Admission medications   Medication Sig Start Date End Date Taking? Authorizing Provider  aspirin 81 MG tablet Take 81 mg by mouth daily.     Yes Historical  Provider, MD  calcium carbonate (OS-CAL) 1250 MG chewable tablet Chew 2 tablets by mouth 2 (two) times daily.   Yes Historical Provider, MD  clonazePAM (KLONOPIN) 0.5 MG tablet Take 0.5 mg by mouth 2 (two) times daily as needed for anxiety. Manic behavior   Yes Historical Provider, MD  docusate sodium (COLACE) 100 MG capsule Take 100 mg by mouth daily.   Yes Historical Provider, MD  magnesium oxide (MAG-OX) 400 MG tablet Take 400 mg by mouth daily.   Yes Historical Provider, MD  meloxicam (MOBIC) 7.5 MG tablet Take 7.5 mg by mouth daily.     Yes Historical Provider, MD  metFORMIN (GLUCOPHAGE) 500 MG tablet Take 500 mg by mouth 2 (two) times daily with a meal.     Yes Historical Provider, MD  multivitamin-lutein (OCUVITE-LUTEIN) CAPS Take 1 capsule by mouth daily.   Yes Historical Provider, MD  omeprazole (PRILOSEC) 20 MG capsule Take 20 mg by mouth daily.     Yes Historical Provider, MD  potassium chloride SA (K-DUR,KLOR-CON) 20 MEQ tablet Take 20 mEq by mouth 2 (two) times daily.    Yes Historical Provider, MD  QUEtiapine (SEROQUEL XR) 50 MG  TB24 Take 50 mg by mouth at bedtime.   Yes Historical Provider, MD  simvastatin (ZOCOR) 20 MG tablet Take 20 mg by mouth at bedtime.     Yes Historical Provider, MD  sitaGLIPtin (JANUVIA) 100 MG tablet Take 100 mg by mouth daily.   Yes Historical Provider, MD  TELMISARTAN PO Take by mouth. FAMILY WILL BRING IN MEDICATION TO CONFIRM DOSAGE. Pharmacy not available to get medication strength.   Yes Historical Provider, MD  timolol (TIMOPTIC) 0.25 % ophthalmic solution 1 drop 2 (two) times daily.     Yes Historical Provider, MD   Physical Exam: Blood pressure 125/53, pulse 91, temperature 96.8 F (36 C), temperature source Rectal, resp. rate 19, SpO2 99.00%. Filed Vitals:   05/16/12 0809 05/16/12 0810 05/16/12 0813 05/16/12 0828  BP: 156/73 140/57 125/53   Pulse: 87 90 91   Temp:    96.8 F (36 C)  TempSrc:    Rectal  Resp: 19     SpO2: 99% 97% 99%       General exam: Moderately built and nourished pleasant female patient who is ambulating steadily in the room and does not appear in any distress. She is calm and cooperative at this time but keep stocking and tangentially at times.  Head, eyes and ENT: Nontraumatic and normocephalic. Pupils equally reacting to light and accommodation. Oral mucosa moist.  Lymphatics: No lymphadenopathy.  Neck: Supple. No JVD, carotid bruit or thyromegaly.  Respiratory system: Clear to auscultation. No increased work of breathing.  Cardiovascular system: S1 and S2 heard. RRR. No JVD, murmurs, gallops or pedal edema.  Gastrointestinal system: Abdomen is nondistended, soft and nontender. Normal bowel sounds heard. No organomegaly or masses appreciated.  Central nervous system: Alert and oriented x3. No focal neurological deficits.  Extremities: Symmetric 5 x 5 power. Peripheral pulses symmetrically felt. Surgical scars on bilateral knees.  Skin: Examination of exposed skin without acute findings.  Musculoskeletal system: Negative exam.  Psychiatric: Currently pleasant and cooperative but repeating herself and talking excessively. No hallucinations or delusions at this time. No suicidal or homicidal ideations.   Labs on Admission:  Basic Metabolic Panel:  Recent Labs Lab 05/14/12 0800 05/16/12 0430  NA 137 133*  K 3.3* 3.1*  CL 97 95*  CO2 22 22  GLUCOSE 166* 127*  BUN 20 22  CREATININE 0.92 0.97  CALCIUM 9.9 10.1   Liver Function Tests:  Recent Labs Lab 05/14/12 0800 05/16/12 0430  AST 27 19  ALT 21 17  ALKPHOS 102 88  BILITOT 0.5 0.5  PROT 7.9 7.0  ALBUMIN 4.2 3.8   No results found for this basename: LIPASE, AMYLASE,  in the last 168 hours  Recent Labs Lab 05/14/12 0910  AMMONIA 20   CBC:  Recent Labs Lab 05/14/12 0800 05/16/12 0430  WBC 7.4 7.8  NEUTROABS 5.8  --   HGB 13.5 12.2  HCT 40.3 35.6*  MCV 96.2 94.4  PLT 226 197   Cardiac Enzymes:  Recent  Labs Lab 05/14/12 0800  TROPONINI <0.30   BNP: No components found with this basename: POCBNP,  CBG: No results found for this basename: GLUCAP,  in the last 168 hours  Radiological Exams on Admission: Ct Head Wo Contrast  05/14/2012  *RADIOLOGY REPORT*  Clinical Data: Altered mental status.  Diabetic hypertensive patient.  CT HEAD WITHOUT CONTRAST  Technique:  Contiguous axial images were obtained from the base of the skull through the vertex without contrast.  Comparison: 10/19/2011.  Findings: No intracranial  hemorrhage.  No CT evidence of large acute infarct.  Global atrophy without hydrocephalus.  No intracranial mass lesion detected on this unenhanced exam.  Vascular calcifications.  Small left frontal calvarial anteriorly directed exostosis unchanged.  Partial opacification inferior aspect right mastoid air cells.  Visualized orbital structures unremarkable.  IMPRESSION: No intracranial hemorrhage or CT evidence of large acute infarct. Please see above.   Original Report Authenticated By: Genia Del, M.D.    Ct Angio Chest Aortic Dissect W &/or W/o  05/14/2012  *RADIOLOGY REPORT*  Clinical Data: Altered mental status.  Back pain.  Tortuous and ectatic thoracic aorta.  Palpitations.  CT ANGIOGRAPHY CHEST  Technique:  Multidetector CT imaging of the chest using the standard protocol during bolus administration of intravenous contrast. Multiplanar reconstructed images including MIPs were obtained and reviewed to evaluate the vascular anatomy.  Contrast: 153mL OMNIPAQUE IOHEXOL 350 MG/ML SOLN  Comparison: Multiple exams, including 05/14/2012  Findings: Initial noncontrast images demonstrate atherosclerotic calcification of the thoracic aorta, branch vessels, and coronary arteries.  Punctate calcifications in the liver and spleen suggest remote granulomatous disease.  Prominent glenohumeral degenerative arthropathy noted.  Arterial phase images demonstrate no evidence of dissection.  Wall thickening  associated with the atherosclerotic calcification noted, particularly in the descending thoracic aorta.  There is aortic tortuosity.  The proximal ascending thoracic aorta 4.2 cm; distal ascending thoracic aorta 4.1 cm; mid arch diameter 3.3 cm; the upper descending thoracic aorta 3.7 cm; mid thoracic descending aorta 3.6 cm; at the hiatus the aorta measures 3.8 cm in diameter. There is likely some minimal mural thrombus along the left margin of the descending thoracic aorta.  Common bile duct 1.8 cm.  Minimal intrahepatic biliary dilatation. By report the patient is had prior cholecystectomy.  No pathologic thoracic adenopathy.  There is a suggestion of mid esophageal wall thickening.  Thoracic spondylosis and lower thoracic degenerative disc disease noted.  Mild right apical pleuroparenchymal scarring.  Mild scarring medially in the right upper lobe.  Dependent subsegmental atelectasis noted in the right lower lobe.  IMPRESSION:  1.  Ectatic and tortuous thoracic aorta, without dissection. Atherosclerotic calcification noted involving the thoracic alignment, coronary arteries, and to a lesser extent the branch vessels. 2.  Biliary dilatation.  Some of this may be air response cholecystectomy.  There to suspected mid thoracic esophageal mild wall thickening, query esophagitis. 3.  Degenerative glenohumeral arthropathy, with suspected shoulder joint effusions. 4.  Thoracic spondylosis and degenerative disc disease.   Original Report Authenticated By: Van Clines, M.D.      Time spent: 70 minutes  [facility] Pager [phone]  If 7PM-7AM, please contact night-coverage [email or web address] Password [facility] 05/16/2012, 9:35 AM

## 2012-05-16 NOTE — BH Assessment (Signed)
Assessment Note   Karen [name] is an 79 y.o. female who presented with family as a walk-in.  Patient was still dressed in her nightclothes. Loud, agitated, and disoriented. She kept insisting that security was someone she knew named "Clair Gulling" and she would try to push him and then try to hug him. Obtained collateral information from the family and they say she is lucid when she is able to sleep, but has had some medication changes and has been unable to sleep. They say that the patient does not have any mental health history. They brought the patient to the ED last night and reported that she was given something to sedate her and that once she slept and woke up and saw the psychiatrist that she was "fine" and exhibiting none of the symptoms she had been brought in with. Patient was reviewed by Patriciaann Clan, PA who recommended that the patient be returned to [facility] for medical clearance and for a referral to a geropsychiatric unit, once medically cleared.  Axis I: Psychotic Disorder NOS Axis II: No diagnosis Axis III:  Past Medical History  Diagnosis Date  . Diabetes mellitus   . Hypertension   . H/O: gout   . Fibromyalgia   . Constipation   . Hx: UTI (urinary tract infection)   . Arthritis    Axis IV: other psychosocial or environmental problems Axis V: 31-40 impairment in reality testing  Past Medical History:  Past Medical History  Diagnosis Date  . Diabetes mellitus   . Hypertension   . H/O: gout   . Fibromyalgia   . Constipation   . Hx: UTI (urinary tract infection)   . Arthritis     Past Surgical History  Procedure Laterality Date  . Knee surgery    . Replacement total hip w/  resurfacing implants    . Spinal fusion    . Joint replacement  left hip-2007, left knee-2000, right knee-1997    Left Hip, Bilateral Knee replacements  . Back surgery  1972  . Closed reduction of left total hip      x 3  . Right foot surgery      x 2  . Appendectomy    . Cholecystectomy    .  Hernia repair    . Salivary gland stone extraction      Family History: No family history on file.  Social History:  reports that she has never smoked. She has never used smokeless tobacco. She reports that she does not drink alcohol or use illicit drugs.  Additional Social History:     CIWA:   COWS:    Allergies:  Allergies  Allergen Reactions  . Codeine Other (See Comments)    Unknown   . Morphine And Related Other (See Comments)    Unknown   . Ultram (Tramadol Hcl) Itching    Home Medications:  (Not in a hospital admission)  OB/GYN Status:  No LMP recorded. Patient is postmenopausal.  General Assessment Data Location of Assessment: [facility] Assessment Services Living Arrangements: Children Can pt return to current living arrangement?: Yes Admission Status: Voluntary Is patient capable of signing voluntary admission?: No Transfer from: Home Referral Source: Self/Family/Friend  Education Status Is patient currently in school?: No  Risk to self Suicidal Ideation: No Suicidal Intent: No Is patient at risk for suicide?: No Suicidal Plan?: No Access to Means: No Previous Attempts/Gestures: No How many times?: 0 Triggers for Past Attempts: None known Intentional Self Injurious Behavior: None Family Suicide History:  No Recent stressful life event(s): Other (Comment) (medication changes) Persecutory voices/beliefs?: No Depression: No Substance abuse history and/or treatment for substance abuse?: No Suicide prevention information given to non-admitted patients: Not applicable  Risk to Others Homicidal Ideation: No Thoughts of Harm to Others: No Current Homicidal Intent: No Current Homicidal Plan: No Access to Homicidal Means: No History of harm to others?: No Assessment of Violence: None Noted Does patient have access to weapons?: No Criminal Charges Pending?: No Does patient have a court date: No  Psychosis Hallucinations: None noted Delusions: Unspecified  (thinks security guard is someone else named Merchandiser, retail")  Mental Status Report Appear/Hygiene: Disheveled Eye Contact: Poor Motor Activity: Agitation Speech: Loud;Incoherent Level of Consciousness: Alert Mood: Angry Affect: Angry Anxiety Level: Severe Panic attack frequency: daily Most recent panic attack: 05-13-12 Thought Processes: Flight of Ideas Judgement: Impaired Orientation: Not oriented Obsessive Compulsive Thoughts/Behaviors: None  Cognitive Functioning Concentration: Decreased Memory: Recent Impaired;Remote Intact IQ: Average Insight: Poor Impulse Control: Poor Appetite: Fair Sleep: Decreased Total Hours of Sleep: 0 Vegetative Symptoms: None  ADLScreening [facility] Assessment Services) Patient's cognitive ability adequate to safely complete daily activities?: Yes Patient able to express need for assistance with ADLs?: Yes Independently performs ADLs?: Yes (appropriate for developmental age)  Abuse/Neglect [facility]) Physical Abuse: Denies Verbal Abuse: Denies Sexual Abuse: Denies  Prior Inpatient Therapy Prior Inpatient Therapy: No  Prior Outpatient Therapy Prior Outpatient Therapy: No  ADL Screening (condition at time of admission) Patient's cognitive ability adequate to safely complete daily activities?: Yes Patient able to express need for assistance with ADLs?: Yes Independently performs ADLs?: Yes (appropriate for developmental age)       Abuse/Neglect Assessment (Assessment to be complete while patient is alone) Physical Abuse: Denies Verbal Abuse: Denies Sexual Abuse: Denies Exploitation of patient/patient's resources: Denies Self-Neglect: Denies     Regulatory affairs officer (For Healthcare) Advance Directive: Patient does not have advance directive Nutrition Screen- [place] Adult/WL/AP Patient's home diet: Regular Have you recently lost weight without trying?: No Have you been eating poorly because of a decreased appetite?: No Malnutrition Screening Tool  Score: 0  Additional Information 1:1 In Past 12 Months?: No CIRT Risk: Yes Elopement Risk: Yes Does patient have medical clearance?: No     Disposition:  Disposition Initial Assessment Completed for this Encounter: Yes Disposition of Patient:  (to [place] for med clearance) Patient referred to: Other (Comment) ([facility] then possible [place] referral)  On Site Evaluation by:   Reviewed with Physician:     Libby Maw 05/16/2012 4:36 AM

## 2012-05-16 NOTE — BHH Counselor (Signed)
Received a call from [place] with [place]. Sts that patient is currently under review.

## 2012-05-16 NOTE — ED Provider Notes (Addendum)
Pt with increased agitation and given haldol Spoke with psychiatrist as well as hospitalist. Patient is medically cleared and patient to be placed by behavior health  Pt accepted ot OV   Date: 05/16/2012  Rate: 82  Rhythm: normal sinus rhythm  QRS Axis: normal  Intervals: normal  ST/T Wave abnormalities: normal  Conduction Disutrbances:nonspecific intraventricular conduction delay  Narrative Interpretation:   Old EKG Reviewed: none available    Leota Jacobsen, MD 05/16/12 0901  Leota Jacobsen, MD 05/16/12 [id number]  Leota Jacobsen, MD 05/16/12 1434

## 2012-05-16 NOTE — ED Notes (Signed)
RL:[id number] Expected date:05/16/12<BR> Expected time: 3:46 AM<BR> Means of arrival:Ambulance<BR> Comments:<BR> Hold for [facility]

## 2012-05-16 NOTE — BHH Counselor (Signed)
Per Karen [name], Patient accepted to [facility] by Dr. Bethanne Ginger. The Call report # is [phone]. EDP-Dr. Elberta Fortis made aware of patients disposition. Patients nurse-Carrie also made aware of patients disposition. Patient was placed under IVC by psychiatrist Dr. Louretta Shorten. Patient to be transferred to [facility] via sheriff.

## 2012-05-16 NOTE — ED Notes (Signed)
Pt's son comes out of room to nurse's desk stating that pt is getting very upset again.  Pt comes in hallway, raising her voice demanding that she needs to go home.  Pt was redirected by nurse tech back to her room.  EDP Zenia Resides was made aware. New orders given verbally.

## 2012-05-16 NOTE — ED Notes (Signed)
Report called to Jana Half, Therapist, sports at [facility].

## 2012-05-16 NOTE — ED Notes (Signed)
Pt trying to get out of bed bc she is wanting to go home. Pt yelling at her family members and can be heard at the nurse's station.  Pt starting to get physical with her son bc she is wanting to go home.

## 2012-05-16 NOTE — Consult Note (Signed)
Reason for Consult: Psychosis NOS vs AMS Referring Physician: Dr. Franz Dell is an 79 y.o. female.  HPI: Patient was seen and chart reviewed. Patient was brought in by her son with the presentation of altered mental status since she suffered diarrhea on Thursday and Friday and unable to sleep on Saturday. Reportedly patient was evaluated [facility] department on Sunday after sedating her 8 tablets with medication. Patient has appeared like herself without specific emotional or behavioral problems, and then the chest to the outpatient services with the trazodone as needed for sleep. Patient primary care doctor, Dr. Minna Antis has provided Seroquel 50 mg at bedtime and Klonopin 2.5 mg twice daily. Patient son reported that she slept about 2 was and woke up since then. She has found with altered mental status. Patient has been hallucinating both auditory and visual fair person. She knows in her church, easily getting upset, faking body pains and even screaming occasionally and using curse words which is against her normal  personality. Patient was not able to provide the needed and goal-directed history, but continuously talking irrationally, confused, and easily distracted. Additional information obtained from  her son Mr. [place] at bedside. Since Sunday patient developed altered mental status-constantly talking, repeating herself, not making much sense and agitation. Pt sister has diagnosed with alzheimer dementia.  She was seen in the ED on 05/14/12. Workup for metabolic and infectious causes was negative. She was advised to discontinue amitriptyline and Seroquel.  Patient might have taken amitriptyline instead of Seroquel as per her son. She was also started on trazodone when necessary but never took it since it was changed to Seroquel and Klonopin . She was seen by her PCP on 05/15/12. He continued Seroquel  50 mg at bedtimeand added when necessary Klonopin digital 0.5 mg twice daily.  She took these medications last night. Patient's son went to sleep at about 6 PM. At 17 PM he heard knocking on his door. Since then patient  was found with altered mental status. I  She was unable to sleep all night. Son and family member coaxed her to coming to the ED. Per son, patient had similar episode in September 2013 for which she spent 2 days in the ED, mental status changes resolved and she was discharged home.    MSE: Patient has been confused, irritable, agitated, screaming and talkative and easily distracted. She has not recognized change in her mental status. She has poor insight, judgment.   Past Medical History  Diagnosis Date  . Diabetes mellitus   . Hypertension   . H/O: gout   . Fibromyalgia   . Constipation   . Hx: UTI (urinary tract infection)   . Arthritis     Past Surgical History  Procedure Laterality Date  . Knee surgery    . Replacement total hip w/  resurfacing implants    . Spinal fusion    . Joint replacement  left hip-2007, left knee-2000, right knee-1997    Left Hip, Bilateral Knee replacements  . Back surgery  1972  . Closed reduction of left total hip      x 3  . Right foot surgery      x 2  . Appendectomy    . Cholecystectomy    . Hernia repair    . Salivary gland stone extraction      History reviewed. No pertinent family history.  Social History:  reports that she has never smoked. She has never used smokeless  tobacco. She reports that she does not drink alcohol or use illicit drugs.  Allergies:  Allergies  Allergen Reactions  . Codeine Other (See Comments)    Unknown   . Morphine And Related Other (See Comments)    Unknown   . Ultram (Tramadol Hcl) Itching    Medications: I have reviewed the patient's current medications.  Results for orders placed during the hospital encounter of 05/16/12 (from the past 48 hour(s))  ACETAMINOPHEN LEVEL     Status: None   Collection Time    05/16/12  4:30 AM      Result Value Range    Acetaminophen (Tylenol), Serum <15.0  10 - 30 ug/mL   Comment:            THERAPEUTIC CONCENTRATIONS VARY     SIGNIFICANTLY. A RANGE OF 10-30     ug/mL MAY BE AN EFFECTIVE     CONCENTRATION FOR MANY PATIENTS.     HOWEVER, SOME ARE BEST TREATED     AT CONCENTRATIONS OUTSIDE THIS     RANGE.     ACETAMINOPHEN CONCENTRATIONS     >150 ug/mL AT 4 HOURS AFTER     INGESTION AND >50 ug/mL AT 12     HOURS AFTER INGESTION ARE     OFTEN ASSOCIATED WITH TOXIC     REACTIONS.  CBC     Status: Abnormal   Collection Time    05/16/12  4:30 AM      Result Value Range   WBC 7.8  4.0 - 10.5 K/uL   RBC 3.77 (*) 3.87 - 5.11 MIL/uL   Hemoglobin 12.2  12.0 - 15.0 g/dL   HCT 35.6 (*) 36.0 - 46.0 %   MCV 94.4  78.0 - 100.0 fL   MCH 32.4  26.0 - 34.0 pg   MCHC 34.3  30.0 - 36.0 g/dL   RDW 13.8  11.5 - 15.5 %   Platelets 197  150 - 400 K/uL  COMPREHENSIVE METABOLIC PANEL     Status: Abnormal   Collection Time    05/16/12  4:30 AM      Result Value Range   Sodium 133 (*) 135 - 145 mEq/L   Potassium 3.1 (*) 3.5 - 5.1 mEq/L   Chloride 95 (*) 96 - 112 mEq/L   CO2 22  19 - 32 mEq/L   Glucose, Bld 127 (*) 70 - 99 mg/dL   BUN 22  6 - 23 mg/dL   Creatinine, Ser 0.97  0.50 - 1.10 mg/dL   Calcium 10.1  8.4 - 10.5 mg/dL   Total Protein 7.0  6.0 - 8.3 g/dL   Albumin 3.8  3.5 - 5.2 g/dL   AST 19  0 - 37 U/L   ALT 17  0 - 35 U/L   Alkaline Phosphatase 88  39 - 117 U/L   Total Bilirubin 0.5  0.3 - 1.2 mg/dL   GFR calc non Af Amer 53 (*) >90 mL/min   GFR calc Af Amer 62 (*) >90 mL/min   Comment:            The eGFR has been calculated     using the CKD EPI equation.     This calculation has not been     validated in all clinical     situations.     eGFR's persistently     <90 mL/min signify     possible Chronic Kidney Disease.  ETHANOL     Status: None   Collection  Time    05/16/12  4:30 AM      Result Value Range   Alcohol, Ethyl (B) <11  0 - 11 mg/dL   Comment:            LOWEST DETECTABLE LIMIT  FOR     SERUM ALCOHOL IS 11 mg/dL     FOR MEDICAL PURPOSES ONLY  SALICYLATE LEVEL     Status: Abnormal   Collection Time    05/16/12  4:30 AM      Result Value Range   Salicylate Lvl 123456 (*) 2.8 - 20.0 mg/dL  URINE RAPID DRUG SCREEN (HOSP PERFORMED)     Status: None   Collection Time    05/16/12  7:50 AM      Result Value Range   Opiates NONE DETECTED  NONE DETECTED   Cocaine NONE DETECTED  NONE DETECTED   Benzodiazepines NONE DETECTED  NONE DETECTED   Amphetamines NONE DETECTED  NONE DETECTED   Tetrahydrocannabinol NONE DETECTED  NONE DETECTED   Barbiturates NONE DETECTED  NONE DETECTED   Comment:            DRUG SCREEN FOR MEDICAL PURPOSES     ONLY.  IF CONFIRMATION IS NEEDED     FOR ANY PURPOSE, NOTIFY LAB     WITHIN 5 DAYS.                LOWEST DETECTABLE LIMITS     FOR URINE DRUG SCREEN     Drug Class       Cutoff (ng/mL)     Amphetamine      1000     Barbiturate      200     Benzodiazepine   [id number]     Tricyclics       [id number]     Opiates          300     Cocaine          300     THC              50    Ct Angio Chest Aortic Dissect W &/or W/o  05/14/2012  *RADIOLOGY REPORT*  Clinical Data: Altered mental status.  Back pain.  Tortuous and ectatic thoracic aorta.  Palpitations.  CT ANGIOGRAPHY CHEST  Technique:  Multidetector CT imaging of the chest using the standard protocol during bolus administration of intravenous contrast. Multiplanar reconstructed images including MIPs were obtained and reviewed to evaluate the vascular anatomy.  Contrast: 161mL OMNIPAQUE IOHEXOL 350 MG/ML SOLN  Comparison: Multiple exams, including 05/14/2012  Findings: Initial noncontrast images demonstrate atherosclerotic calcification of the thoracic aorta, branch vessels, and coronary arteries.  Punctate calcifications in the liver and spleen suggest remote granulomatous disease.  Prominent glenohumeral degenerative arthropathy noted.  Arterial phase images demonstrate no evidence of dissection.  Wall  thickening associated with the atherosclerotic calcification noted, particularly in the descending thoracic aorta.  There is aortic tortuosity.  The proximal ascending thoracic aorta 4.2 cm; distal ascending thoracic aorta 4.1 cm; mid arch diameter 3.3 cm; the upper descending thoracic aorta 3.7 cm; mid thoracic descending aorta 3.6 cm; at the hiatus the aorta measures 3.8 cm in diameter. There is likely some minimal mural thrombus along the left margin of the descending thoracic aorta.  Common bile duct 1.8 cm.  Minimal intrahepatic biliary dilatation. By report the patient is had prior cholecystectomy.  No pathologic thoracic adenopathy.  There is a suggestion of mid esophageal wall thickening.  Thoracic  spondylosis and lower thoracic degenerative disc disease noted.  Mild right apical pleuroparenchymal scarring.  Mild scarring medially in the right upper lobe.  Dependent subsegmental atelectasis noted in the right lower lobe.  IMPRESSION:  1.  Ectatic and tortuous thoracic aorta, without dissection. Atherosclerotic calcification noted involving the thoracic alignment, coronary arteries, and to a lesser extent the branch vessels. 2.  Biliary dilatation.  Some of this may be air response cholecystectomy.  There to suspected mid thoracic esophageal mild wall thickening, query esophagitis. 3.  Degenerative glenohumeral arthropathy, with suspected shoulder joint effusions. 4.  Thoracic spondylosis and degenerative disc disease.   Original Report Authenticated By: Van Clines, M.D.     Positive for anxiety, bad mood, behavior problems, mood swings and psychosis. Blood pressure 125/53, pulse 91, temperature 96.8 F (36 C), temperature source Rectal, resp. rate 19, SpO2 99.00%.   Assessment/Plan: Psychosis NOS Rule out dementia versus delirium  Recommendation:  Patient is not psychiatrically stable to be discharged to the home. Patient needs acute psychiatric hospitalization for crisis stabilization and  referred to geriatric psych hospitalization and appropriate medication management and further evaluation. Will start Risperdal 0.5 mg twice daily for psychosis and Ativan 1 mg PO / IM twice daily for anxiety.  Michel Hendon,JANARDHAHA R. and  05/16/2012, 10:22 AM

## 2012-05-16 NOTE — BHH Counselor (Signed)
[facility] staff called back requesting a EKG, BMT, CBC, and a Urinalysis. Writer contacted EDP-Dr. Zenia Resides and requested that these labs are completed. Once completed will fax results to [facility] for review.

## 2012-05-16 NOTE — ED Notes (Signed)
Pt family sts she has not slept in three days. Pt is arguing with herself. Pt is have hallucinations

## 2012-05-16 NOTE — ED Notes (Signed)
Tele-psych at bedside.

## 2012-05-16 NOTE — Progress Notes (Signed)
Spoke with son who wants to go to pt home and return within next hour to bring pt back some clothes Consulted with psych ED RN

## 2012-05-16 NOTE — ED Notes (Signed)
Psychiatrist MD at bedside.

## 2012-05-16 NOTE — ED Provider Notes (Signed)
History     CSN: [id number]  Arrival date & time 05/16/12  [id number]   First MD Initiated Contact with Patient 05/16/12 0404      Chief Complaint  Patient presents with  . Manic Behavior    (Consider location/radiation/quality/duration/timing/severity/associated sxs/prior treatment) HPI This 79 year old female was discharged within the last week or so after presenting with psychosis that resolved transiently in the ED after a dose of Ativan, she was evaluated by telemetry psychiatry who recommended a medication change and the patient was discharged, since the patient has been home she has been anxious agitated confused combative and hallucinating delusional and difficult for the family to control, her agitation and aggressive combative behavior are even worse tonight so she is brought back to the emergency department the family requests that psychiatry see her before she gets medication to calm her in the ED since she had a recent unremarkable medical evaluation while in the ED with CT scan and lab testing. Patient is an unreliable historian and does not cooperate with history of present illness, review of systems, or examination. Past Medical History  Diagnosis Date  . Diabetes mellitus   . Hypertension   . H/O: gout   . Fibromyalgia   . Constipation   . Hx: UTI (urinary tract infection)   . Arthritis     Past Surgical History  Procedure Laterality Date  . Knee surgery    . Replacement total hip w/  resurfacing implants    . Spinal fusion    . Joint replacement  left hip-2007, left knee-2000, right knee-1997    Left Hip, Bilateral Knee replacements  . Back surgery  1972  . Closed reduction of left total hip      x 3  . Right foot surgery      x 2  . Appendectomy    . Cholecystectomy    . Hernia repair    . Salivary gland stone extraction      History reviewed. No pertinent family history.  History  Substance Use Topics  . Smoking status: Never Smoker   . Smokeless  tobacco: Never Used  . Alcohol Use: No    OB History   Grav Para Term Preterm Abortions TAB SAB Ect Mult Living                  Review of Systems  Unable to perform ROS: Mental status change    Allergies  Codeine; Morphine and related; and Ultram  Home Medications   Current Outpatient Rx  Name  Route  Sig  Dispense  Refill  . aspirin 81 MG tablet   Oral   Take 81 mg by mouth daily.           . calcium carbonate (OS-CAL) 1250 MG chewable tablet   Oral   Chew 2 tablets by mouth 2 (two) times daily.         . clonazePAM (KLONOPIN) 0.5 MG tablet   Oral   Take 0.5 mg by mouth 2 (two) times daily as needed for anxiety. Manic behavior         . docusate sodium (COLACE) 100 MG capsule   Oral   Take 100 mg by mouth daily.         . magnesium oxide (MAG-OX) 400 MG tablet   Oral   Take 400 mg by mouth daily.         . meloxicam (MOBIC) 7.5 MG tablet   Oral   Take 7.5 mg  by mouth daily.           . metFORMIN (GLUCOPHAGE) 500 MG tablet   Oral   Take 500 mg by mouth 2 (two) times daily with a meal.           . multivitamin-lutein (OCUVITE-LUTEIN) CAPS   Oral   Take 1 capsule by mouth daily.         Marland Kitchen omeprazole (PRILOSEC) 20 MG capsule   Oral   Take 20 mg by mouth daily.           . potassium chloride SA (K-DUR,KLOR-CON) 20 MEQ tablet   Oral   Take 20 mEq by mouth 2 (two) times daily.          . QUEtiapine (SEROQUEL XR) 50 MG TB24   Oral   Take 50 mg by mouth at bedtime.         . simvastatin (ZOCOR) 20 MG tablet   Oral   Take 20 mg by mouth at bedtime.           . sitaGLIPtin (JANUVIA) 100 MG tablet   Oral   Take 100 mg by mouth daily.         Marland Kitchen telmisartan (MICARDIS) 80 MG tablet   Oral   Take 80 mg by mouth daily.         . timolol (TIMOPTIC) 0.25 % ophthalmic solution      1 drop 2 (two) times daily.             BP 140/77  Pulse 91  Temp(Src) 97.8 F (36.6 C) (Oral)  Resp 19  SpO2 97%  Physical Exam   Nursing note and vitals reviewed. Constitutional:  Awake, alert, anxious agitated yelling screaming kicking punching fighting pushing pulling and otherwise uncooperative  HENT:  Head: Atraumatic.  Eyes: Pupils are equal, round, and reactive to light. Right eye exhibits no discharge. Left eye exhibits no discharge.  Neck: Neck supple.  Cardiovascular: Normal rate and regular rhythm.   No murmur heard. Pulmonary/Chest: Effort normal and breath sounds normal. No respiratory distress. She has no wheezes. She has no rales. She exhibits no tenderness.  Abdominal: Soft. Bowel sounds are normal. She exhibits no distension and no mass. There is no tenderness. There is no rebound and no guarding.  Musculoskeletal: She exhibits no edema and no tenderness.  Baseline ROM, no obvious new focal weakness.  Neurological: She is alert.  Mental status and motor strength appears baseline for patient and situation.  Skin: No rash noted.  Psychiatric:  Anxious agitated unable to assess whether or not the patient is suicidal or homicidal she does however appear delusional     ED Course  Procedures (including critical care time) Family / Caregiver understand and agree with initial ED impression and plan with expectations set for ED visit.Telepsych recs Med admit and psych consult. Pt calmed after Geodon for psychosis per TelePsych rec. [facility] paged 38. [facility] will consult in ED and [facility] will call Psych afterwards since [facility] feels Pt unlikely to benefit from medical admit. 0800    Labs Reviewed  CBC - Abnormal; Notable for the following:    RBC 3.77 (*)    HCT 35.6 (*)    All other components within normal limits  COMPREHENSIVE METABOLIC PANEL - Abnormal; Notable for the following:    Sodium 133 (*)    Potassium 3.1 (*)    Chloride 95 (*)    Glucose, Bld 127 (*)    GFR calc non  Af Amer 53 (*)    GFR calc Af Amer 62 (*)    All other components within normal limits  SALICYLATE LEVEL - Abnormal;  Notable for the following:    Salicylate Lvl 123456 (*)    All other components within normal limits  TSH - Abnormal; Notable for the following:    TSH 7.030 (*)    All other components within normal limits  VITAMIN B12 - Abnormal; Notable for the following:    Vitamin B-12 1192 (*)    All other components within normal limits  ACETAMINOPHEN LEVEL  ETHANOL  URINE RAPID DRUG SCREEN (HOSP PERFORMED)  RPR   No results found.   1. Hallucinations       MDM  Dispo pending consultant recommendations.        Babette Relic, MD 05/22/12 [phone]

## 2012-05-16 NOTE — ED Notes (Signed)
Attempted to put warm blankets on patient due to temperature. Patient refused and stated "I am hot natured. Get those off of me." RN Morey Hummingbird notified.

## 2012-05-16 NOTE — BHH Counselor (Signed)
Patient referred to [facility] and [place]; pending review.

## 2012-05-29 DIAGNOSIS — L658 Other specified nonscarring hair loss: Secondary | ICD-10-CM | POA: Diagnosis not present

## 2012-05-29 DIAGNOSIS — L570 Actinic keratosis: Secondary | ICD-10-CM | POA: Diagnosis not present

## 2012-05-29 DIAGNOSIS — L723 Sebaceous cyst: Secondary | ICD-10-CM | POA: Diagnosis not present

## 2012-05-31 DIAGNOSIS — E039 Hypothyroidism, unspecified: Secondary | ICD-10-CM | POA: Diagnosis not present

## 2012-05-31 DIAGNOSIS — E876 Hypokalemia: Secondary | ICD-10-CM | POA: Diagnosis not present

## 2012-05-31 DIAGNOSIS — F29 Unspecified psychosis not due to a substance or known physiological condition: Secondary | ICD-10-CM | POA: Diagnosis not present

## 2012-06-21 DIAGNOSIS — G4700 Insomnia, unspecified: Secondary | ICD-10-CM | POA: Diagnosis not present

## 2012-06-21 DIAGNOSIS — F309 Manic episode, unspecified: Secondary | ICD-10-CM | POA: Diagnosis not present

## 2012-06-28 DIAGNOSIS — M810 Age-related osteoporosis without current pathological fracture: Secondary | ICD-10-CM | POA: Diagnosis not present

## 2012-06-28 DIAGNOSIS — I10 Essential (primary) hypertension: Secondary | ICD-10-CM | POA: Diagnosis not present

## 2012-06-28 DIAGNOSIS — M899 Disorder of bone, unspecified: Secondary | ICD-10-CM | POA: Diagnosis not present

## 2012-06-28 DIAGNOSIS — E119 Type 2 diabetes mellitus without complications: Secondary | ICD-10-CM | POA: Diagnosis not present

## 2012-06-28 DIAGNOSIS — M949 Disorder of cartilage, unspecified: Secondary | ICD-10-CM | POA: Diagnosis not present

## 2012-06-28 DIAGNOSIS — E7800 Pure hypercholesterolemia, unspecified: Secondary | ICD-10-CM | POA: Diagnosis not present

## 2012-07-05 DIAGNOSIS — Z006 Encounter for examination for normal comparison and control in clinical research program: Secondary | ICD-10-CM | POA: Diagnosis not present

## 2012-07-05 DIAGNOSIS — M159 Polyosteoarthritis, unspecified: Secondary | ICD-10-CM | POA: Diagnosis not present

## 2012-07-05 DIAGNOSIS — I10 Essential (primary) hypertension: Secondary | ICD-10-CM | POA: Diagnosis not present

## 2012-07-05 DIAGNOSIS — IMO0001 Reserved for inherently not codable concepts without codable children: Secondary | ICD-10-CM | POA: Diagnosis not present

## 2012-07-05 DIAGNOSIS — F411 Generalized anxiety disorder: Secondary | ICD-10-CM | POA: Diagnosis not present

## 2012-07-05 DIAGNOSIS — Z0000 Encounter for general adult medical examination without abnormal findings: Secondary | ICD-10-CM | POA: Diagnosis not present

## 2012-07-05 DIAGNOSIS — E119 Type 2 diabetes mellitus without complications: Secondary | ICD-10-CM | POA: Diagnosis not present

## 2012-08-02 DIAGNOSIS — G4700 Insomnia, unspecified: Secondary | ICD-10-CM | POA: Diagnosis not present

## 2012-08-02 DIAGNOSIS — F411 Generalized anxiety disorder: Secondary | ICD-10-CM | POA: Diagnosis not present

## 2012-08-31 DIAGNOSIS — F411 Generalized anxiety disorder: Secondary | ICD-10-CM | POA: Diagnosis not present

## 2012-08-31 DIAGNOSIS — G4700 Insomnia, unspecified: Secondary | ICD-10-CM | POA: Diagnosis not present

## 2012-09-12 DIAGNOSIS — Z1231 Encounter for screening mammogram for malignant neoplasm of breast: Secondary | ICD-10-CM | POA: Diagnosis not present

## 2012-09-13 ENCOUNTER — Other Ambulatory Visit: Payer: Self-pay

## 2012-11-14 DIAGNOSIS — M25519 Pain in unspecified shoulder: Secondary | ICD-10-CM | POA: Diagnosis not present

## 2012-11-14 DIAGNOSIS — Z23 Encounter for immunization: Secondary | ICD-10-CM | POA: Diagnosis not present

## 2012-11-14 DIAGNOSIS — IMO0001 Reserved for inherently not codable concepts without codable children: Secondary | ICD-10-CM | POA: Diagnosis not present

## 2012-11-14 DIAGNOSIS — M19019 Primary osteoarthritis, unspecified shoulder: Secondary | ICD-10-CM | POA: Diagnosis not present

## 2012-11-14 DIAGNOSIS — IMO0002 Reserved for concepts with insufficient information to code with codable children: Secondary | ICD-10-CM | POA: Diagnosis not present

## 2012-11-14 DIAGNOSIS — M1990 Unspecified osteoarthritis, unspecified site: Secondary | ICD-10-CM | POA: Diagnosis not present

## 2012-11-16 DIAGNOSIS — IMO0002 Reserved for concepts with insufficient information to code with codable children: Secondary | ICD-10-CM | POA: Diagnosis not present

## 2012-11-16 DIAGNOSIS — M25519 Pain in unspecified shoulder: Secondary | ICD-10-CM | POA: Diagnosis not present

## 2012-11-16 DIAGNOSIS — IMO0001 Reserved for inherently not codable concepts without codable children: Secondary | ICD-10-CM | POA: Diagnosis not present

## 2012-11-16 DIAGNOSIS — M1990 Unspecified osteoarthritis, unspecified site: Secondary | ICD-10-CM | POA: Diagnosis not present

## 2012-11-24 ENCOUNTER — Ambulatory Visit (INDEPENDENT_AMBULATORY_CARE_PROVIDER_SITE_OTHER): Payer: Medicare Other | Admitting: Gynecology

## 2012-11-24 ENCOUNTER — Encounter: Payer: Self-pay | Admitting: Gynecology

## 2012-11-24 VITALS — BP 132/78 | HR 18 | Resp 80 | Ht 63.5 in | Wt 175.0 lb

## 2012-11-24 DIAGNOSIS — Z01419 Encounter for gynecological examination (general) (routine) without abnormal findings: Secondary | ICD-10-CM | POA: Diagnosis not present

## 2012-11-24 DIAGNOSIS — A630 Anogenital (venereal) warts: Secondary | ICD-10-CM

## 2012-11-24 NOTE — Patient Instructions (Signed)

## 2012-11-24 NOTE — Progress Notes (Signed)
79 y.o. Married Caucasian female   No obstetric history on file. here for annual exam. Pt reports menses are regular.  She does not report hot flashes, does not have night sweats, does not  have vaginal dryness.  She is not using lubricants.  She does not report post-menopasual bleeding.  Pt had been treated for recurrent vulvar wart 4x in past,most recently 1y ago, that time treated with 80% TCA, negative biopsy included last year.  Started seroquel and remeron for issues with insomnia in 05/2012 with good results  No LMP recorded. Patient is postmenopausal.          Sexually active: no  The current method of family planning is post menopausal status.    Exercising: yes  housework, walking 30 mins/qd Last pap:  Abnormal PAP: Mammogram: 08/2012 BSE: yes Colonoscopy: 4-5 years ago DEXA: 07/2012 Alcohol: no Tobacco: no  Hgb: PCP ; Urine:PCP  Health Maintenance  Topic Date Due  . Tetanus/tdap  07/21/1949  . Colonoscopy  07/21/1980  . Zostavax  07/22/1990  . Pneumococcal Polysaccharide Vaccine Age 83 And Over  07/22/1995  . Influenza Vaccine  09/08/2012    No family history on file.  Patient Active Problem List   Diagnosis Date Noted  . Altered mental status 05/16/2012  . Hallucinations 05/16/2012  . Hypokalemia 05/16/2012  . Diabetes mellitus 05/16/2012  . HTN (hypertension) 05/16/2012    Past Medical History  Diagnosis Date  . Diabetes mellitus   . Hypertension   . H/O: gout   . Fibromyalgia   . Constipation   . Hx: UTI (urinary tract infection)   . Arthritis     Past Surgical History  Procedure Laterality Date  . Knee surgery    . Replacement total hip w/  resurfacing implants    . Spinal fusion    . Joint replacement  left hip-2007, left knee-2000, right knee-1997    Left Hip, Bilateral Knee replacements  . Back surgery  1972  . Closed reduction of left total hip      x 3  . Right foot surgery      x 2  . Appendectomy    . Cholecystectomy    . Hernia repair     . Salivary gland stone extraction      Allergies: Codeine; Morphine and related; and Ultram  Current Outpatient Prescriptions  Medication Sig Dispense Refill  . aspirin 81 MG tablet Take 81 mg by mouth daily.        . calcium carbonate (OS-CAL) 1250 MG chewable tablet Chew 2 tablets by mouth 2 (two) times daily.      Marland Kitchen docusate sodium (COLACE) 100 MG capsule Take 100 mg by mouth daily.      . magnesium oxide (MAG-OX) 400 MG tablet Take 400 mg by mouth daily.      . meloxicam (MOBIC) 7.5 MG tablet Take 7.5 mg by mouth daily.        . metFORMIN (GLUCOPHAGE) 500 MG tablet Take 500 mg by mouth 2 (two) times daily with a meal.        . mirtazapine (REMERON) 15 MG tablet Take 15 mg by mouth at bedtime.      . multivitamin-lutein (OCUVITE-LUTEIN) CAPS Take 1 capsule by mouth daily.      . potassium chloride SA (K-DUR,KLOR-CON) 20 MEQ tablet Take 20 mEq by mouth 2 (two) times daily.       . QUEtiapine (SEROQUEL XR) 50 MG TB24 Take 50 mg by mouth at bedtime.      Marland Kitchen  simvastatin (ZOCOR) 20 MG tablet Take 20 mg by mouth at bedtime.        . sitaGLIPtin (JANUVIA) 100 MG tablet Take 100 mg by mouth daily.      Marland Kitchen telmisartan (MICARDIS) 80 MG tablet Take 80 mg by mouth daily.       No current facility-administered medications for this visit.    ROS: Pertinent items are noted in HPI.  Exam:    BP 132/78  Pulse 18  Resp 80  Ht 5' 3.5" (1.613 m)  Wt 175 lb (79.379 kg)  BMI 30.51 kg/m2 Weight change: @WEIGHTCHANGE @ Last 3 height recordings:  Ht Readings from Last 3 Encounters:  11/24/12 5' 3.5" (1.613 m)   General appearance: alert, cooperative and appears stated age Head: Normocephalic, without obvious abnormality, atraumatic Neck: no adenopathy, no carotid bruit, no JVD, supple, symmetrical, trachea midline and thyroid not enlarged, symmetric, no tenderness/mass/nodules Lungs: clear to auscultation bilaterally Breasts: normal appearance, no masses or tenderness Heart: regular rate and  rhythm, S1, S2 normal, no murmur, click, rub or gallop Abdomen: soft, non-tender; bowel sounds normal; no masses,  no organomegaly Extremities: extremities normal, atraumatic, no cyanosis or edema Skin: Skin color, texture, turgor normal. No rashes or lesions Lymph nodes: Cervical, supraclavicular, and axillary nodes normal. no inguinal nodes palpated Neurologic: Grossly normal   Pelvic: External genitalia:  Warty lesion on right labia with extension onto minora, same place as treated 10/13 and previously              Urethra: normal appearing urethra with no masses, tenderness or lesions              Bartholins and Skenes: normal                 Vagina: atrophic              Cervix: atrophic              Pap taken: no        Bimanual Exam:  Uterus:  atrophic                                      Adnexa:    no masses                                      Rectovaginal: Confirms                                      Anus:  normal sphincter tone, no lesions  A: well women, postmenopausal Recurrence of vulvar wart biopsied 3x, last 11/2011  P: mammogram Discussed recurrence of vulvar wart- offered repeat TCA therapy vs removal in OR-possible CO2 laser based on location on minora, pt would like to try TCA again, will return to office for treatment counseled on breast self exam, menopause, osteoporosis, adequate intake of calcium and vitamin D, diet and exercise return annually or prn Discussed PAP guideline changes, importance of weight bearing exercises, calcium, vit D and balanced diet.  An After Visit Summary was printed and given to the patient.

## 2012-11-26 NOTE — Addendum Note (Signed)
Addended by: Elveria Rising on: 11/26/2012 11:11 AM   Modules accepted: Level of Service

## 2012-12-01 ENCOUNTER — Encounter: Payer: Self-pay | Admitting: Gynecology

## 2012-12-01 ENCOUNTER — Ambulatory Visit (INDEPENDENT_AMBULATORY_CARE_PROVIDER_SITE_OTHER): Payer: Medicare Other | Admitting: Gynecology

## 2012-12-01 VITALS — BP 128/82 | HR 88 | Resp 16 | Ht 63.5 in | Wt 174.0 lb

## 2012-12-01 DIAGNOSIS — A630 Anogenital (venereal) warts: Secondary | ICD-10-CM | POA: Diagnosis not present

## 2012-12-01 NOTE — Progress Notes (Signed)
Subjective:     Patient ID: Karen [name], female   DOB: 02-09-1928, 79 y.o.   MRN: [id number]  HPI Comments: Here for treatment of recurrent vulvar wart, excised and biopsies repeatedly, last time 1y ago-benign.  Pt without questions    Review of Systems  All other systems reviewed and are negative.       Objective:   Physical Exam  Constitutional: She is oriented to person, place, and time. She appears well-developed and well-nourished.  Genitourinary:     Neurological: She is alert and oriented to person, place, and time.       Assessment:     Recurrent vulvar wart     Plan:     Treated with 80%TCA per pt request Surrounding tissue treated with petroleum jelly,  Lesions treated with TCA until blanching, xylocaine jelly placed for comfort afterwards Tolerated well f/u2w

## 2012-12-14 ENCOUNTER — Other Ambulatory Visit: Payer: Self-pay

## 2012-12-15 ENCOUNTER — Ambulatory Visit (INDEPENDENT_AMBULATORY_CARE_PROVIDER_SITE_OTHER): Payer: Medicare Other | Admitting: Gynecology

## 2012-12-15 ENCOUNTER — Encounter: Payer: Self-pay | Admitting: Gynecology

## 2012-12-15 ENCOUNTER — Telehealth: Payer: Self-pay | Admitting: Gynecology

## 2012-12-15 VITALS — BP 132/78 | HR 76 | Resp 12 | Ht 63.5 in | Wt 179.0 lb

## 2012-12-15 DIAGNOSIS — A630 Anogenital (venereal) warts: Secondary | ICD-10-CM

## 2012-12-15 NOTE — Telephone Encounter (Signed)
Patient needs to return for a 2 week reck with Dr. Charlies Constable. There are no appointment available for the rest of the month.

## 2012-12-15 NOTE — Progress Notes (Signed)
Subjective:     Patient ID: Karen [name], female   DOB: Nov 08, 1928, 79 y.o.   MRN: [id number]  HPI Comments: Pt here for treatment of vulvar warts, 2nd treatment.  Wart recurrent.  Pt without complaints. Reports cannot see it now    Review of Systems  Constitutional: Negative for chills and fatigue.  Skin: Negative for color change, pallor and wound.       Objective:   Physical Exam  Constitutional: She is oriented to person, place, and time. She appears well-developed and well-nourished.  Genitourinary:     Neurological: She is alert and oriented to person, place, and time.  Skin: Skin is warm and dry.       Assessment:     Recurrent vulvar wart for treatment     Plan:     Area prepped with vasoline jelly, 80% TCA applied to point of white changes Tolerated well F/u 2w, pt agreeable

## 2012-12-18 NOTE — Telephone Encounter (Signed)
Spoke with pt who reports she needs an appt around 12-29-12 for recheck. Advised there are no appts currently open, but that I would check with the supervisor about opening up a few slots. Pt agreeable and will be available for calls all morning today.

## 2012-12-18 NOTE — Telephone Encounter (Signed)
Spoke with pt about appt 01-01-13 at 10:45. Pt has appt with Dr. Minna Antis that morning but she will call and reschedule it. Pt appreciative.

## 2013-01-01 ENCOUNTER — Ambulatory Visit (INDEPENDENT_AMBULATORY_CARE_PROVIDER_SITE_OTHER): Payer: Medicare Other | Admitting: Gynecology

## 2013-01-01 ENCOUNTER — Encounter: Payer: Self-pay | Admitting: Gynecology

## 2013-01-01 VITALS — BP 118/78 | HR 72 | Resp 14 | Ht 63.5 in | Wt 180.0 lb

## 2013-01-01 DIAGNOSIS — A630 Anogenital (venereal) warts: Secondary | ICD-10-CM

## 2013-01-01 NOTE — Progress Notes (Signed)
Subjective:     Patient ID: Karen [name], female   DOB: 11-11-1928, 79 y.o.   MRN: [id number]  HPI Comments: Pt here for treatment of vulvar warts, 3rd treatment.  Wart recurrent.  Pt without complaints. Reports cannot see it now    Review of Systems  Constitutional: Negative for activity change and appetite change.  Skin: Positive for rash (wart, increase?).       Objective:   Physical Exam  Constitutional: She is oriented to person, place, and time. She appears well-developed and well-nourished.  Genitourinary:     Neurological: She is alert and oriented to person, place, and time.  Skin: Skin is warm and dry.       Assessment:     Recurrent vulvar wart     Plan:     Area treated with 80% TCA, surrounding tissue prepped with vasoline, area treated to blanching.  Tolerated well  F/u 2w

## 2013-01-02 DIAGNOSIS — D239 Other benign neoplasm of skin, unspecified: Secondary | ICD-10-CM | POA: Diagnosis not present

## 2013-01-02 DIAGNOSIS — M109 Gout, unspecified: Secondary | ICD-10-CM | POA: Diagnosis not present

## 2013-01-02 DIAGNOSIS — L658 Other specified nonscarring hair loss: Secondary | ICD-10-CM | POA: Diagnosis not present

## 2013-01-02 DIAGNOSIS — L821 Other seborrheic keratosis: Secondary | ICD-10-CM | POA: Diagnosis not present

## 2013-01-02 DIAGNOSIS — L723 Sebaceous cyst: Secondary | ICD-10-CM | POA: Diagnosis not present

## 2013-01-02 DIAGNOSIS — R0609 Other forms of dyspnea: Secondary | ICD-10-CM | POA: Diagnosis not present

## 2013-01-02 DIAGNOSIS — E559 Vitamin D deficiency, unspecified: Secondary | ICD-10-CM | POA: Diagnosis not present

## 2013-01-02 DIAGNOSIS — E119 Type 2 diabetes mellitus without complications: Secondary | ICD-10-CM | POA: Diagnosis not present

## 2013-01-02 DIAGNOSIS — E039 Hypothyroidism, unspecified: Secondary | ICD-10-CM | POA: Diagnosis not present

## 2013-01-08 ENCOUNTER — Ambulatory Visit: Payer: Self-pay | Admitting: Gynecology

## 2013-01-08 ENCOUNTER — Other Ambulatory Visit: Payer: Self-pay | Admitting: Internal Medicine

## 2013-01-08 ENCOUNTER — Ambulatory Visit
Admission: RE | Admit: 2013-01-08 | Discharge: 2013-01-08 | Disposition: A | Discharge status: Home / Self Care | Payer: Medicare Other | Source: Ambulatory Visit | Attending: Internal Medicine | Admitting: Internal Medicine

## 2013-01-08 DIAGNOSIS — I712 Thoracic aortic aneurysm, without rupture: Secondary | ICD-10-CM | POA: Diagnosis not present

## 2013-01-08 DIAGNOSIS — R0602 Shortness of breath: Secondary | ICD-10-CM

## 2013-01-08 MED ORDER — IOHEXOL 350 MG/ML SOLN
125.0000 mL | Freq: Once | INTRAVENOUS | Status: AC | PRN
  Administered 2013-01-08: 125 mL via INTRAVENOUS

## 2013-01-09 DIAGNOSIS — I712 Thoracic aortic aneurysm, without rupture: Secondary | ICD-10-CM | POA: Diagnosis not present

## 2013-01-15 DIAGNOSIS — E119 Type 2 diabetes mellitus without complications: Secondary | ICD-10-CM | POA: Diagnosis not present

## 2013-01-19 ENCOUNTER — Ambulatory Visit (INDEPENDENT_AMBULATORY_CARE_PROVIDER_SITE_OTHER): Payer: Medicare Other | Admitting: Gynecology

## 2013-01-19 VITALS — BP 120/60 | HR 78 | Resp 14 | Ht 63.5 in | Wt 179.0 lb

## 2013-01-19 DIAGNOSIS — I719 Aortic aneurysm of unspecified site, without rupture: Secondary | ICD-10-CM

## 2013-01-19 DIAGNOSIS — I712 Thoracic aortic aneurysm, without rupture: Secondary | ICD-10-CM | POA: Insufficient documentation

## 2013-01-19 DIAGNOSIS — L089 Local infection of the skin and subcutaneous tissue, unspecified: Secondary | ICD-10-CM | POA: Diagnosis not present

## 2013-01-19 DIAGNOSIS — A630 Anogenital (venereal) warts: Secondary | ICD-10-CM

## 2013-01-19 MED ORDER — CEPHALEXIN 500 MG PO CAPS: 500.0000 mg | ORAL_CAPSULE | Freq: Four times a day (QID) | ORAL | Status: DC

## 2013-01-19 NOTE — Progress Notes (Signed)
Subjective:     Patient ID: Karen [name], female   DOB: 1928/05/12, 79 y.o.   MRN: [id number]  HPI Comments: Pt here for treatment of vulvar warts, 4th treatment.  Wart recurrent.  Pt without complaints. Reports cannot see it now. Pt reports developing a "boil" on her left labia that spontaneously ruptured 5d ago and drained, she denies fever or chills.  Drained for 2-3d, and now is healing and feeling better.  Pt denies any local irritation to the groin area Pt reports recently diagnosed with throacic aortic aneurysm-4.3cm, was short of breath    Review of Systems  Constitutional: Positive for fatigue (diagnosed the aneurysm). Negative for fever and chills.  Genitourinary: Negative for vaginal bleeding, vaginal discharge and pelvic pain.  Skin: Positive for wound.       Objective:   Physical Exam  Constitutional: She is oriented to person, place, and time. She appears well-developed and well-nourished.  Genitourinary:     Lymphadenopathy:       Right: No inguinal adenopathy present.       Left: No inguinal adenopathy present.  Neurological: She is alert and oriented to person, place, and time.  Skin: Skin is warm and dry.       Assessment:     Vulvar wart- Skin ulceration      Plan:     Continues to respond to TCA-4th treatment today after pretreating skin in vasoline-80% TCA  To blanching, tolerated well Skin healling but recommend  Sitz baths and keflex  RTO 10d

## 2013-01-19 NOTE — Patient Instructions (Signed)
Sitz bath warm water twice a day, blow dry on cool setting

## 2013-01-29 ENCOUNTER — Ambulatory Visit (INDEPENDENT_AMBULATORY_CARE_PROVIDER_SITE_OTHER): Payer: Medicare Other | Admitting: Gynecology

## 2013-01-29 ENCOUNTER — Telehealth: Payer: Self-pay | Admitting: Gynecology

## 2013-01-29 ENCOUNTER — Encounter: Payer: Self-pay | Admitting: Gynecology

## 2013-01-29 VITALS — BP 128/70 | HR 72 | Resp 18 | Ht 63.5 in | Wt 179.0 lb

## 2013-01-29 DIAGNOSIS — L089 Local infection of the skin and subcutaneous tissue, unspecified: Secondary | ICD-10-CM | POA: Diagnosis not present

## 2013-01-29 DIAGNOSIS — A630 Anogenital (venereal) warts: Secondary | ICD-10-CM

## 2013-01-29 NOTE — Progress Notes (Signed)
Subjective:     Patient ID: Karen [name], female   DOB: 1928/06/10, 79 y.o.   MRN: [id number]  HPI Comments: Pt here for f/u of skin ulcer and vulvar wart.  Pt had been using sitz baths and took antibiotics as directed.  She feels great and without complaints    Review of Systems  Constitutional: Positive for fatigue (getting better). Negative for fever and chills.  Respiratory: Positive for shortness of breath (seeing pcp this week).        Objective:   Physical Exam  Constitutional: She appears well-developed and well-nourished.  Genitourinary:          Assessment:     Wart resolved Skin ulcer     Plan:     Wart resolved Skin ulcer mostly resolved-cont sitz bath, keep area dry with cool dryer

## 2013-01-29 NOTE — Telephone Encounter (Signed)
Patient needs a 2 week reck with Dr. Charlies Constable, no appt. Available.

## 2013-01-30 NOTE — Telephone Encounter (Signed)
appt scheduled for 02/14/13.

## 2013-02-12 DIAGNOSIS — R0609 Other forms of dyspnea: Secondary | ICD-10-CM | POA: Diagnosis not present

## 2013-02-12 DIAGNOSIS — E7800 Pure hypercholesterolemia, unspecified: Secondary | ICD-10-CM | POA: Diagnosis not present

## 2013-02-14 ENCOUNTER — Telehealth: Payer: Self-pay | Admitting: Gynecology

## 2013-02-14 ENCOUNTER — Ambulatory Visit (INDEPENDENT_AMBULATORY_CARE_PROVIDER_SITE_OTHER): Payer: Medicare Other | Admitting: Gynecology

## 2013-02-14 ENCOUNTER — Encounter: Payer: Self-pay | Admitting: Gynecology

## 2013-02-14 VITALS — BP 124/68 | HR 84 | Resp 18 | Ht 63.5 in | Wt 178.0 lb

## 2013-02-14 DIAGNOSIS — L98499 Non-pressure chronic ulcer of skin of other sites with unspecified severity: Secondary | ICD-10-CM

## 2013-02-14 DIAGNOSIS — L98491 Non-pressure chronic ulcer of skin of other sites limited to breakdown of skin: Secondary | ICD-10-CM

## 2013-02-14 DIAGNOSIS — A630 Anogenital (venereal) warts: Secondary | ICD-10-CM

## 2013-02-14 NOTE — Progress Notes (Signed)
Subjective:     Patient ID: Karen [name], female   DOB: 19-Jun-1928, 80 y.o.   MRN: [id number]  HPI Comments: Here for 2w check of vulvar wart and labial ulcer.  Pt believes both are now resolved, she is without complaints    Review of Systems     Objective:   Physical Exam  Nursing note and vitals reviewed. Constitutional: She is oriented to person, place, and time. She appears well-developed and well-nourished.  Genitourinary:     Neurological: She is alert and oriented to person, place, and time.       Assessment:     Ulcer healed Wart recured     Plan:     Wart treated with 80% TCA after skin protected with petroleum jelly  F/u 2w

## 2013-02-14 NOTE — Telephone Encounter (Signed)
Patient needs a 2 week reck appointment with Dr.Lathrop. No appointment available.

## 2013-02-28 NOTE — Telephone Encounter (Signed)
Patient has appointment scheduled for 1/27 at 81 with Dr. Charlies Constable.

## 2013-03-06 ENCOUNTER — Encounter: Payer: Self-pay | Admitting: Gynecology

## 2013-03-06 ENCOUNTER — Ambulatory Visit (INDEPENDENT_AMBULATORY_CARE_PROVIDER_SITE_OTHER): Payer: Medicare Other | Admitting: Gynecology

## 2013-03-06 VITALS — BP 130/82 | HR 86 | Resp 16 | Ht 63.5 in | Wt 181.0 lb

## 2013-03-06 DIAGNOSIS — A630 Anogenital (venereal) warts: Secondary | ICD-10-CM | POA: Diagnosis not present

## 2013-03-06 NOTE — Progress Notes (Signed)
Subjective:     Patient ID: Karen [name], female   DOB: Apr 13, 1928, 80 y.o.   MRN: [id number]  HPI Comments: Here for retreatment of vulvar warts, no complaints    Review of Systems Per HPI    Objective:   Physical Exam  Constitutional: She is oriented to person, place, and time. She appears well-developed and well-nourished.  Genitourinary:     Neurological: She is alert and oriented to person, place, and time.       Assessment:     Vulvar wart     Plan:     Treated with TCA, will rto in 1w instead of 2 for treatment, area on majora resolved

## 2013-03-12 DIAGNOSIS — R0609 Other forms of dyspnea: Secondary | ICD-10-CM | POA: Diagnosis not present

## 2013-03-12 DIAGNOSIS — I729 Aneurysm of unspecified site: Secondary | ICD-10-CM | POA: Diagnosis not present

## 2013-03-13 ENCOUNTER — Encounter: Payer: Self-pay | Admitting: Gynecology

## 2013-03-13 ENCOUNTER — Ambulatory Visit (INDEPENDENT_AMBULATORY_CARE_PROVIDER_SITE_OTHER): Payer: Medicare Other | Admitting: Gynecology

## 2013-03-13 VITALS — BP 130/80 | HR 82 | Resp 16 | Ht 63.5 in | Wt 179.0 lb

## 2013-03-13 DIAGNOSIS — A630 Anogenital (venereal) warts: Secondary | ICD-10-CM

## 2013-03-13 NOTE — Progress Notes (Signed)
Subjective:     Patient ID: Karen [name], female   DOB: July 24, 1928, 80 y.o.   MRN: [id number]  HPI Comments: Pt here for 1w f/u of vulvar wart, we decided to treat closer in time.  Pt was restarted on her zocor-stopped due to mucle pain but did not resolve, they are now considering stopping the seroquel.  Being evaluated for SOB-had CT showed thoracic aneurism.    Review of Systems Per HPI    Objective:   Physical Exam  Nursing note and vitals reviewed. Constitutional: She appears well-developed and well-nourished.  Genitourinary:          Assessment:     Vulvar wart     Plan:     Retreat today with better blanching Pt informed area has been biopsied and benign can also elect to not treat, she would like to return next week and we will reassess at that time, area on majora healed

## 2013-03-14 ENCOUNTER — Encounter: Payer: Self-pay | Admitting: Cardiovascular Disease

## 2013-03-15 ENCOUNTER — Ambulatory Visit (INDEPENDENT_AMBULATORY_CARE_PROVIDER_SITE_OTHER): Payer: Medicare Other | Admitting: Cardiovascular Disease

## 2013-03-15 ENCOUNTER — Encounter: Payer: Self-pay | Admitting: Cardiovascular Disease

## 2013-03-15 VITALS — BP 148/94 | HR 77 | Resp 20 | Ht 64.5 in | Wt 180.9 lb

## 2013-03-15 DIAGNOSIS — R0602 Shortness of breath: Secondary | ICD-10-CM

## 2013-03-15 NOTE — Progress Notes (Signed)
Patient ID: DIBBIE MADDY, female   DOB: 01/29/1929, 80 y.o.   MRN: [id number]      Reason for office visit Shortness of breath and fatigue  Karen [name] is referred in consultation primarily for dyspnea and fatigue. She believes this is secondary to antipsychotic medications which were prescribed last year. Feels that she has been weak ever since she has been taking these medications. She is worried about their side effects. She states that her problems began when she was unable to sleep for 2 consecutive nights and that sleep deprivation made her feel very ill. Before that she had taken Elavil to assist with sleep for many years. She is not able to sleep at night but just does not feel well.  Her dyspnea occurs exclusively with exertion and sounds to be relatively mild (functional class II). She took a simvastatin holiday for 3 months but is no better. She is now taking her statin again.   She denies exertional chest pain or any symptoms it was suggest angina pectoris. She has not had syncope and she denies palpitations. She is not troubled by lower shunting edema or intermittent claudication. During her workup last April she was incidentally found to have 4.2 cm thoracic aortic aneurysm, not much changed by a repeat scan performed last December. I don't think she's ever had an echocardiogram She does not have any other known structural cardiac or vascular problems. She has had relatively mild and well-controlled hypertension. She has treated diabetes mellitus and hyperlipidemia. A stress test was reportedly performed about 10 years ago and was normal.  Chest CT was performed for an elevated d-dimer which has remained elevated, although it is lower. Other lab tests are normal. A BNP was checked earlier this month and was 180 which is formally outside the normal range but is probably an appropriate value for an 80 year old woman  She has had numerous orthopedic surgeries including bilateral  knee replacement and recurrent dislocation of the left knee which was last revised in 2008, lumbar spine surgery (she has rods and fusion) and cervical spine surgery.   Allergies  Allergen Reactions  . Codeine Other (See Comments)    Unknown   . Lisinopril Cough  . Morphine And Related Other (See Comments)    Unknown   . Ultram [Tramadol Hcl] Itching    Current Outpatient Prescriptions  Medication Sig Dispense Refill  . aspirin 81 MG tablet Take 81 mg by mouth daily.        . Calcium Carbonate-Vitamin D (CALCIUM 600+D) 600-400 MG-UNIT per tablet Take 1 tablet by mouth daily.      Marland Kitchen docusate sodium (COLACE) 100 MG capsule Take 100 mg by mouth daily.      . magnesium oxide (MAG-OX) 400 MG tablet Take 400 mg by mouth daily.      . meloxicam (MOBIC) 7.5 MG tablet Take 7.5 mg by mouth 2 (two) times daily.       . metFORMIN (GLUCOPHAGE) 500 MG tablet Take 500 mg by mouth 2 (two) times daily with a meal.        . metoprolol succinate (TOPROL-XL) 25 MG 24 hr tablet Take 25 mg by mouth daily. Patient takes after dinner      . mirtazapine (REMERON) 15 MG tablet Take 15 mg by mouth at bedtime.      . multivitamin-lutein (OCUVITE-LUTEIN) CAPS Take 1 capsule by mouth daily.      . potassium chloride SA (K-DUR,KLOR-CON) 20 MEQ tablet Take 20 mEq by  mouth daily.       . QUEtiapine (SEROQUEL XR) 50 MG TB24 Take 200 mg by mouth at bedtime.       . simvastatin (ZOCOR) 20 MG tablet Take 20 mg by mouth at bedtime.        . sitaGLIPtin (JANUVIA) 100 MG tablet Take 100 mg by mouth daily.      Marland Kitchen telmisartan (MICARDIS) 80 MG tablet Take 80 mg by mouth daily.       No current facility-administered medications for this visit.    Past Medical History  Diagnosis Date  . Diabetes mellitus   . Hypertension   . H/O: gout   . Fibromyalgia   . Constipation   . Hx: UTI (urinary tract infection)   . Arthritis     Past Surgical History  Procedure Laterality Date  . Knee surgery  1997/2000  . Replacement  total hip w/  resurfacing implants  [id number]  . Spinal fusion  2006  . Joint replacement  left hip-2007, left knee-2000, right knee-1997    Left Hip, Bilateral Knee replacements  . Back surgery  1972  . Closed reduction of left total hip  2008    x 3  . Right foot surgery  2004    x 2  . Appendectomy  1951  . Cholecystectomy  1951  . Hernia repair  1963  . Salivary gland stone extraction      Family History  Problem Relation Age of Onset  . Heart attack Father   . Stroke Father   . Cancer Mother   . Heart attack Brother   . Cancer Brother     History   Social History  . Marital Status: Widowed    Spouse Name: N/A    Number of Children: N/A  . Years of Education: N/A   Occupational History  . Not on file.   Social History Main Topics  . Smoking status: Never Smoker   . Smokeless tobacco: Never Used  . Alcohol Use: No  . Drug Use: No  . Sexual Activity: No   Other Topics Concern  . Not on file   Social History Narrative  . No narrative on file    Review of systems: The patient specifically denies any chest pain at rest or with exertion, dyspnea at rest, orthopnea, paroxysmal nocturnal dyspnea, syncope, palpitations, focal neurological deficits, intermittent claudication, lower extremity edema, unexplained weight gain, cough, hemoptysis or wheezing.  The patient also denies abdominal pain, nausea, vomiting, dysphagia, diarrhea, constipation, polyuria, polydipsia, dysuria, hematuria, frequency, urgency, abnormal bleeding or bruising, fever, chills, unexpected weight changes, mood swings, change in skin or hair texture, change in voice quality, auditory or visual problems, allergic reactions or rashes, new musculoskeletal complaints other than usual "aches and pains".   PHYSICAL EXAM BP 148/94  Pulse 77  Resp 20  Ht 5' 4.5" (1.638 m)  Wt 82.056 kg (180 lb 14.4 oz)  BMI 30.58 kg/m2  General: Alert, oriented x3, no distress Head: no evidence of trauma, PERRL,  EOMI, no exophtalmos or lid lag, no myxedema, no xanthelasma; normal ears, nose and oropharynx Neck: normal jugular venous pulsations and no hepatojugular reflux; brisk carotid pulses without delay and no carotid bruits Chest: clear to auscultation, no signs of consolidation by percussion or palpation, normal fremitus, symmetrical and full respiratory excursions Cardiovascular: normal position and quality of the apical impulse, regular rhythm, normal first and second heart sounds, no murmurs, rubs or gallops Abdomen: no tenderness or distention, no masses  by palpation, no abnormal pulsatility or arterial bruits, normal bowel sounds, no hepatosplenomegaly Extremities: no clubbing, cyanosis or edema; 2+ radial, ulnar and brachial pulses bilaterally; 2+ right femoral, posterior tibial and dorsalis pedis pulses; 2+ left femoral, posterior tibial and dorsalis pedis pulses; no subclavian or femoral bruits Neurological: grossly nonfocal   The patient also denies abdominal pain, nausea, vomiting, dysphagia, diarrhea, constipation, polyuria, polydipsia, dysuria, hematuria, frequency, urgency, abnormal bleeding or bruising, fever, chills, unexpected weight changes, mood swings, change in skin or hair texture, change in voice quality, auditory or visual problems, allergic reactions or rashes, new musculoskeletal complaints other than usual "aches and pains".   EKG: Sinus rhythm, normal tracing, QTC 443  Lipid Panel  No results found for this basename: chol, trig, hdl, cholhdl, vldl, ldlcalc    BMET    Component Value Date/Time   NA 133* 05/16/2012 0430   K 3.1* 05/16/2012 0430   CL 95* 05/16/2012 0430   CO2 22 05/16/2012 0430   GLUCOSE 127* 05/16/2012 0430   BUN 22 05/16/2012 0430   CREATININE 0.97 05/16/2012 0430   CALCIUM 10.1 05/16/2012 0430   GFRNONAA 53* 05/16/2012 0430   GFRAA 62* 05/16/2012 0430     ASSESSMENT AND PLAN  Karen [name] complaints are fairly nonspecific and appear rather mild. It almost  seems that she is requesting a reason to discontinue her psychotropic medications, which she believes are responsible for her symptoms. Unfortunately I don't have a good understanding of what her psychiatric diagnosis is, but the medications that she takes are indeed fairly potent neuroleptics. She seems very intense and rather pressured.  Nevertheless, she has numerous risk factors for coronary disease and is an octogenarian. I think it is appropriate that we evaluate her for coronary insufficiency and also look for signs of systolic or diastolic dysfunction. Have recommended that she have an echocardiogram. She'll also have a Lexiscan Myoview since she cannot walk on a treadmill because of the knee problems. Barring serious abnormalities on these tests, I don't believe invasive cardiac evaluation is oriented.  From a cardiology standpoint, I do share her concerns about the side effects of the medications which could worsen hyperglycemia and hyperlipidemia and also be responsible for significant QT interval prolongation. I don't have her most recent lipid profile glycemic control seems to be good. Her QTC today is completely normal. On the other hand, it sounds like she even required inpatient psychiatric management and these medications should not be stopped without serious consideration of the consequences.  Orders Placed This Encounter  Procedures  . Myocardial Perfusion Imaging  . EKG 12-Lead  . 2D Echocardiogram without contrast   Meds ordered this encounter  Medications  . Calcium Carbonate-Vitamin D (CALCIUM 600+D) 600-400 MG-UNIT per tablet    Sig: Take 1 tablet by mouth daily.    Holli Humbles, MD, [facility] [phone] office [phone] pager

## 2013-03-15 NOTE — Patient Instructions (Signed)
Dr. Sallyanne Kuster has requested that you have an echocardiogram. Echocardiography is a painless test that uses sound waves to create images of your heart. It provides your doctor with information about the size and shape of your heart and how well your heart's chambers and valves are working. This procedure takes approximately one hour. There are no restrictions for this procedure.  Dr. Sallyanne Kuster has requested that you have a lexiscan myoview. For further information please visit [email or web address]. Please follow instruction sheet, as given.  Dr. Sallyanne Kuster recommends that you schedule a follow-up appointment in: 1 month.

## 2013-03-19 DIAGNOSIS — M25519 Pain in unspecified shoulder: Secondary | ICD-10-CM | POA: Diagnosis not present

## 2013-03-19 DIAGNOSIS — IMO0002 Reserved for concepts with insufficient information to code with codable children: Secondary | ICD-10-CM | POA: Diagnosis not present

## 2013-03-19 DIAGNOSIS — M1990 Unspecified osteoarthritis, unspecified site: Secondary | ICD-10-CM | POA: Diagnosis not present

## 2013-03-19 DIAGNOSIS — IMO0001 Reserved for inherently not codable concepts without codable children: Secondary | ICD-10-CM | POA: Diagnosis not present

## 2013-03-20 ENCOUNTER — Encounter: Payer: Self-pay | Admitting: Gynecology

## 2013-03-20 ENCOUNTER — Ambulatory Visit (INDEPENDENT_AMBULATORY_CARE_PROVIDER_SITE_OTHER): Payer: Medicare Other | Admitting: Gynecology

## 2013-03-20 VITALS — BP 128/74 | HR 76 | Resp 18 | Ht 64.75 in | Wt 181.0 lb

## 2013-03-20 DIAGNOSIS — A630 Anogenital (venereal) warts: Secondary | ICD-10-CM

## 2013-03-20 NOTE — Progress Notes (Signed)
Subjective:     Patient ID: Karen [name], female   DOB: 1928/09/06, 80 y.o.   MRN: [id number]  HPI Comments: Pt here for 1w f/u of vulvar wart, we decided to treat closer in time.  Recently seen by cardiology having stress thal next week, no complaints thinks lesions resovled    Review of Systems  Per HPI    Objective:   Physical Exam  Nursing note and vitals reviewed. Constitutional: She appears well-developed and well-nourished.  Genitourinary:          Assessment:     Vulvar wart     Plan:     Retreat today with better blanching anticipate this will be last treatment Pt informed area has been biopsied and benign can also elect to not treat, she would like to return next week and we will reassess at that time, area on majora healed

## 2013-03-22 ENCOUNTER — Ambulatory Visit (HOSPITAL_COMMUNITY)
Admission: RE | Admit: 2013-03-22 | Discharge: 2013-03-22 | Disposition: A | Discharge status: Home / Self Care | Payer: Medicare Other | Source: Ambulatory Visit | Attending: Internal Medicine | Admitting: Internal Medicine

## 2013-03-22 DIAGNOSIS — R0609 Other forms of dyspnea: Secondary | ICD-10-CM | POA: Diagnosis not present

## 2013-03-22 DIAGNOSIS — R5383 Other fatigue: Secondary | ICD-10-CM

## 2013-03-22 DIAGNOSIS — R5381 Other malaise: Secondary | ICD-10-CM | POA: Insufficient documentation

## 2013-03-22 DIAGNOSIS — Z8249 Family history of ischemic heart disease and other diseases of the circulatory system: Secondary | ICD-10-CM | POA: Insufficient documentation

## 2013-03-22 DIAGNOSIS — E669 Obesity, unspecified: Secondary | ICD-10-CM | POA: Diagnosis not present

## 2013-03-22 DIAGNOSIS — I739 Peripheral vascular disease, unspecified: Secondary | ICD-10-CM | POA: Insufficient documentation

## 2013-03-22 DIAGNOSIS — R42 Dizziness and giddiness: Secondary | ICD-10-CM | POA: Diagnosis not present

## 2013-03-22 DIAGNOSIS — E119 Type 2 diabetes mellitus without complications: Secondary | ICD-10-CM | POA: Insufficient documentation

## 2013-03-22 DIAGNOSIS — E785 Hyperlipidemia, unspecified: Secondary | ICD-10-CM | POA: Diagnosis not present

## 2013-03-22 DIAGNOSIS — R0602 Shortness of breath: Secondary | ICD-10-CM | POA: Diagnosis not present

## 2013-03-22 DIAGNOSIS — I10 Essential (primary) hypertension: Secondary | ICD-10-CM | POA: Insufficient documentation

## 2013-03-22 DIAGNOSIS — R0989 Other specified symptoms and signs involving the circulatory and respiratory systems: Secondary | ICD-10-CM | POA: Insufficient documentation

## 2013-03-22 MED ORDER — TECHNETIUM TC 99M SESTAMIBI GENERIC - CARDIOLITE
30.9000 | Freq: Once | INTRAVENOUS | Status: AC | PRN
  Administered 2013-03-22: 30.9 via INTRAVENOUS

## 2013-03-22 MED ORDER — REGADENOSON 0.4 MG/5ML IV SOLN
0.4000 mg | Freq: Once | INTRAVENOUS | Status: AC
  Administered 2013-03-22: 0.4 mg via INTRAVENOUS

## 2013-03-22 MED ORDER — TECHNETIUM TC 99M SESTAMIBI GENERIC - CARDIOLITE
10.8000 | Freq: Once | INTRAVENOUS | Status: AC | PRN
  Administered 2013-03-22: 11 via INTRAVENOUS

## 2013-03-22 MED ORDER — AMINOPHYLLINE 25 MG/ML IV SOLN
75.0000 mg | Freq: Once | INTRAVENOUS | Status: AC
  Administered 2013-03-22: 75 mg via INTRAVENOUS

## 2013-03-22 NOTE — Procedures (Addendum)
[facility] [address] [place] [address] [id number]  Cardiology Nuclear Med Study  Karen [name] is a 80 y.o. female     MRN : [id number]     DOB: [birth year]  Procedure Date: 03/22/2013  Nuclear Med Background Indication for Stress Test:  Evaluation for Ischemia History:  No prior cardiac or respiratory history reported by patient. Cardiac Risk Factors: Family History - CAD, Hypertension, Lipids, NIDDM, Obesity and PVD  Symptoms:  Dizziness, DOE, Fatigue and Light-Headedness   Nuclear Pre-Procedure Caffeine/Decaff Intake:  1:00am NPO After: 11am   IV Site: R Forearm  IV 0.9% NS with Angio Cath:  22g  Chest Size (in):  n/a IV Started by: Azucena Cecil, RN  Height: 5' 4.5" (1.638 m)  Cup Size: C  BMI:  Body mass index is 30.43 kg/(m^2). Weight:  180 lb (81.647 kg)   Tech Comments:  n/a    Nuclear Med Study 1 or 2 day study: 1 day  Stress Test Type:  [place] Provider:  Sanda Klein, MD   Resting Radionuclide: Technetium 38m Sestamibi  Resting Radionuclide Dose: 10.8 mCi   Stress Radionuclide:  Technetium 47m Sestamibi  Stress Radionuclide Dose: 30.9 mCi           Stress Protocol Rest HR: 80 Stress HR: 91  Rest BP: 161/110 Stress BP:199/106  Exercise Time (min): n/a METS: n/a          Dose of Adenosine (mg):  n/a Dose of Lexiscan: 0.4 mg  Dose of Atropine (mg): n/a Dose of Dobutamine: n/a mcg/kg/min (at max HR)  Stress Test Technologist: Mellody Memos, CCT Nuclear Technologist: Imagene Riches, CNMT   Rest Procedure:  Myocardial perfusion imaging was performed at rest 45 minutes following the intravenous administration of Technetium 67m Sestamibi. Stress Procedure:  The patient received IV Lexiscan 0.4 mg over 15-seconds.  Technetium 81m Sestamibi injected at 30-seconds.  Due to patient's shortness of breath, she was given IV Aminophylline 75 mg. Symptom was resolved during recovery.  There were no significant changes with Lexiscan.  Quantitative spect images were obtained after a 45 minute delay.  Transient Ischemic Dilatation (Normal <1.22):  1.04 Lung/Heart Ratio (Normal <0.45):  0.25 QGS EDV:  116 ml QGS ESV:  64 ml LV Ejection Fraction: 45%  Signed by Joellyn Quails, Rad Tech on 03/22/2013 at 2:32 PM.      Rest ECG: NSR - Normal EKG  Stress ECG: No significant change from baseline ECG  QPS Raw Data Images:  Normal; no motion artifact; normal heart/lung ratio. Stress Images:  Normal homogeneous uptake in all areas of the myocardium. Rest Images:  Normal homogeneous uptake in all areas of the myocardium. Subtraction (SDS):  No evidence of ischemia. LV Wall Motion:  Mild global hypokinesis with estimated EF of 45%  Impression Exercise Capacity:  Lexiscan with no exercise. BP Response:  Normal blood pressure response. Clinical Symptoms:  No symptoms. ECG Impression:  No significant ECG changes with Lexiscan. Comparison with Prior Nuclear Study: No previous nuclear study performed   Overall Impression:  Abnormal nuclear study due to depressed left ventricular function. Suspect nonischemic cardiomyopathy. No perfusion abnormalities seen, but cannot completely exclude "balanced ischemia".   Sanda Klein, MD  03/22/2013 6:04 PM

## 2013-03-26 ENCOUNTER — Ambulatory Visit (HOSPITAL_COMMUNITY)
Admission: RE | Admit: 2013-03-26 | Discharge: 2013-03-26 | Disposition: A | Discharge status: Home / Self Care | Payer: Medicare Other | Source: Ambulatory Visit | Attending: Internal Medicine | Admitting: Internal Medicine

## 2013-03-26 DIAGNOSIS — I359 Nonrheumatic aortic valve disorder, unspecified: Secondary | ICD-10-CM

## 2013-03-26 DIAGNOSIS — R0609 Other forms of dyspnea: Secondary | ICD-10-CM | POA: Diagnosis not present

## 2013-03-26 DIAGNOSIS — I080 Rheumatic disorders of both mitral and aortic valves: Secondary | ICD-10-CM | POA: Diagnosis not present

## 2013-03-26 DIAGNOSIS — I079 Rheumatic tricuspid valve disease, unspecified: Secondary | ICD-10-CM | POA: Diagnosis not present

## 2013-03-26 DIAGNOSIS — I379 Nonrheumatic pulmonary valve disorder, unspecified: Secondary | ICD-10-CM | POA: Insufficient documentation

## 2013-03-26 DIAGNOSIS — R0989 Other specified symptoms and signs involving the circulatory and respiratory systems: Secondary | ICD-10-CM | POA: Insufficient documentation

## 2013-03-26 DIAGNOSIS — R0602 Shortness of breath: Secondary | ICD-10-CM

## 2013-03-26 NOTE — Progress Notes (Signed)
2D Echo Performed 03/26/2013    Jomel Whittlesey, RCS  

## 2013-03-27 ENCOUNTER — Ambulatory Visit: Payer: Medicare Other | Admitting: Gynecology

## 2013-03-29 ENCOUNTER — Telehealth: Payer: Self-pay | Admitting: *Deleted

## 2013-03-29 NOTE — Telephone Encounter (Signed)
Pt was calling in regards to her testing. She wanted to know what the results were.   [place]

## 2013-03-29 NOTE — Telephone Encounter (Signed)
Returned call and pt verified x 2.  Pt informed message received and results have not been read by MD.  Pt informed she will be notified once they have been read.  Pt would like a phone call w/ the results as she does not have access to the internet.  Stated her son must have set up her MyChart account.  Pt informed Pamala Hurry will be notified to call her once results are read.  RN also apologized for the delay as the office being closed r/t weather caused a delay in getting results.  Pt verbalized understanding and agreed w/ plan.  Forwarded to [place], [place].

## 2013-04-04 ENCOUNTER — Ambulatory Visit (INDEPENDENT_AMBULATORY_CARE_PROVIDER_SITE_OTHER): Payer: Medicare Other | Admitting: Gynecology

## 2013-04-04 ENCOUNTER — Encounter: Payer: Self-pay | Admitting: Gynecology

## 2013-04-04 VITALS — BP 126/82 | HR 76 | Resp 16 | Ht 64.5 in | Wt 180.0 lb

## 2013-04-04 DIAGNOSIS — A630 Anogenital (venereal) warts: Secondary | ICD-10-CM | POA: Diagnosis not present

## 2013-04-04 NOTE — Progress Notes (Signed)
Subjective:     Patient ID: Karen [name], female   DOB: [birth year], 80 y.o.   MRN: [id number]  HPI Comments: Pt reports new small wart under the original area we treated.    Review of Systems     Objective:   Physical Exam  Constitutional: She is oriented to person, place, and time. She appears well-developed and well-nourished.  Genitourinary:     Neurological: She is alert and oriented to person, place, and time.       Assessment:     Vulvar warts      Plan:     Pt reassured that treatment is not needed but she does want to treat this new lesion as the last time it got so large so fast. Areas-blue- treated with vasoline and 80% TCA to blanching. Tolerated well. F/u 1w

## 2013-04-08 DIAGNOSIS — I2510 Atherosclerotic heart disease of native coronary artery without angina pectoris: Secondary | ICD-10-CM

## 2013-04-08 HISTORY — PX: CARDIAC CATHETERIZATION: SHX172

## 2013-04-08 HISTORY — DX: Atherosclerotic heart disease of native coronary artery without angina pectoris: I25.10

## 2013-04-11 ENCOUNTER — Telehealth: Payer: Self-pay | Admitting: Gynecology

## 2013-04-11 ENCOUNTER — Ambulatory Visit (INDEPENDENT_AMBULATORY_CARE_PROVIDER_SITE_OTHER): Payer: Medicare Other | Admitting: Gynecology

## 2013-04-11 ENCOUNTER — Encounter: Payer: Self-pay | Admitting: Gynecology

## 2013-04-11 VITALS — BP 124/76 | HR 80 | Resp 16 | Wt 181.0 lb

## 2013-04-11 DIAGNOSIS — A630 Anogenital (venereal) warts: Secondary | ICD-10-CM

## 2013-04-11 NOTE — Telephone Encounter (Signed)
Patient needs a 1 week reck appointment next Wed. 04/18/13 with Dr.Lathrop. No appointments available.

## 2013-04-11 NOTE — Progress Notes (Signed)
Subjective:     Patient ID: Karen [name], female   DOB: 1928-08-17, 80 y.o.   MRN: [id number]  HPI Comments: Pt reports new small wart under the original area we treated.  Without complaints    Review of Systems per HPI     Objective:   Physical Exam  Nursing note and vitals reviewed. Constitutional: She is oriented to person, place, and time. She appears well-developed and well-nourished.  Genitourinary:     Neurological: She is alert and oriented to person, place, and time.       Assessment:     Vulvar warts      Plan:     Pt reassured that treatment is not needed but she does want to treat this new lesion as the last time it got so large so fast. Areas-blue- treated with vasoline and 80% TCA to blanching. All 3 treated Tolerated well. F/u 1w

## 2013-04-11 NOTE — Telephone Encounter (Signed)
Patient scheduled 04/18/13 with Dr. Charlies Constable.

## 2013-04-12 ENCOUNTER — Encounter (HOSPITAL_COMMUNITY): Payer: Self-pay | Admitting: Pharmacy Technician

## 2013-04-12 ENCOUNTER — Other Ambulatory Visit: Payer: Self-pay | Admitting: *Deleted

## 2013-04-12 ENCOUNTER — Ambulatory Visit (INDEPENDENT_AMBULATORY_CARE_PROVIDER_SITE_OTHER): Payer: Medicare Other | Admitting: Cardiovascular Disease

## 2013-04-12 ENCOUNTER — Encounter: Payer: Self-pay | Admitting: Cardiovascular Disease

## 2013-04-12 VITALS — BP 166/89 | HR 76 | Resp 16 | Ht 64.5 in | Wt 182.4 lb

## 2013-04-12 DIAGNOSIS — I10 Essential (primary) hypertension: Secondary | ICD-10-CM

## 2013-04-12 DIAGNOSIS — I429 Cardiomyopathy, unspecified: Secondary | ICD-10-CM

## 2013-04-12 DIAGNOSIS — I509 Heart failure, unspecified: Secondary | ICD-10-CM

## 2013-04-12 DIAGNOSIS — R0602 Shortness of breath: Secondary | ICD-10-CM | POA: Diagnosis not present

## 2013-04-12 DIAGNOSIS — R931 Abnormal findings on diagnostic imaging of heart and coronary circulation: Secondary | ICD-10-CM

## 2013-04-12 DIAGNOSIS — R9439 Abnormal result of other cardiovascular function study: Secondary | ICD-10-CM

## 2013-04-12 DIAGNOSIS — R9389 Abnormal findings on diagnostic imaging of other specified body structures: Secondary | ICD-10-CM

## 2013-04-12 DIAGNOSIS — R5383 Other fatigue: Secondary | ICD-10-CM | POA: Diagnosis not present

## 2013-04-12 DIAGNOSIS — I428 Other cardiomyopathies: Secondary | ICD-10-CM

## 2013-04-12 DIAGNOSIS — Z79899 Other long term (current) drug therapy: Secondary | ICD-10-CM | POA: Diagnosis not present

## 2013-04-12 DIAGNOSIS — D689 Coagulation defect, unspecified: Secondary | ICD-10-CM

## 2013-04-12 DIAGNOSIS — R5381 Other malaise: Secondary | ICD-10-CM

## 2013-04-12 DIAGNOSIS — I719 Aortic aneurysm of unspecified site, without rupture: Secondary | ICD-10-CM

## 2013-04-12 DIAGNOSIS — I5042 Chronic combined systolic (congestive) and diastolic (congestive) heart failure: Secondary | ICD-10-CM | POA: Insufficient documentation

## 2013-04-12 DIAGNOSIS — I5032 Chronic diastolic (congestive) heart failure: Secondary | ICD-10-CM | POA: Insufficient documentation

## 2013-04-12 NOTE — Progress Notes (Signed)
Patient ID: Karen [name], female   DOB: 1928-05-15, 80 y.o.   MRN: [id number]      Reason for office visit Followup echocardiogram and nuclear myocardial perfusion study  Karen [name] continues to express dyspnea and fatigue. Both her echocardiogram and her nuclear perfusion study showed mildly depressed left ventricle systolic function with an ejection fraction of around 45%. She does not have chest pain. The nuclear study did not show any evidence of regional abnormalities in perfusion. She has long-standing systemic hypertension, but until recently this was very well treated and was felt to be mild. She also has diabetes mellitus and hyperlipidemia, but has never smoked.   Allergies  Allergen Reactions  . Codeine Other (See Comments)    Unknown   . Lisinopril Cough  . Morphine And Related Other (See Comments)    Unknown   . Ultram [Tramadol Hcl] Itching    Current Outpatient Prescriptions  Medication Sig Dispense Refill  . aspirin 81 MG tablet Take 81 mg by mouth daily.        . Calcium Carbonate-Vitamin D (CALCIUM 600+D) 600-400 MG-UNIT per tablet Take 1 tablet by mouth daily.      Marland Kitchen docusate sodium (COLACE) 100 MG capsule Take 100 mg by mouth daily.      . magnesium oxide (MAG-OX) 400 MG tablet Take 400 mg by mouth daily.      . meloxicam (MOBIC) 7.5 MG tablet Take 7.5 mg by mouth 2 (two) times daily.       . metFORMIN (GLUCOPHAGE) 500 MG tablet Take 500 mg by mouth 2 (two) times daily with a meal.        . metoprolol succinate (TOPROL-XL) 25 MG 24 hr tablet Take 25 mg by mouth daily. Patient takes after dinner      . mirtazapine (REMERON) 15 MG tablet Take 15 mg by mouth at bedtime.      . Multiple Vitamin (MULTIVITAMIN) tablet Take 1 tablet by mouth daily.      . potassium chloride SA (K-DUR,KLOR-CON) 20 MEQ tablet Take 20 mEq by mouth daily.       . QUEtiapine (SEROQUEL) 200 MG tablet Take 200 mg by mouth at bedtime.      . simvastatin (ZOCOR) 20 MG tablet Take 20 mg  by mouth at bedtime.        . sitaGLIPtin (JANUVIA) 100 MG tablet Take 100 mg by mouth daily.      Marland Kitchen telmisartan (MICARDIS) 80 MG tablet Take 80 mg by mouth daily.       No current facility-administered medications for this visit.    Past Medical History  Diagnosis Date  . Diabetes mellitus   . Hypertension   . H/O: gout   . Fibromyalgia   . Constipation   . Hx: UTI (urinary tract infection)   . Arthritis     Past Surgical History  Procedure Laterality Date  . Knee surgery  1997/2000  . Replacement total hip w/  resurfacing implants  [id number]  . Spinal fusion  2006  . Joint replacement  left hip-2007, left knee-2000, right knee-1997    Left Hip, Bilateral Knee replacements  . Back surgery  1972  . Closed reduction of left total hip  2008    x 3  . Right foot surgery  2004    x 2  . Appendectomy  1951  . Cholecystectomy  1951  . Hernia repair  1963  . Salivary gland stone extraction      Family  History  Problem Relation Age of Onset  . Heart attack Father   . Stroke Father   . Cancer Mother   . Heart attack Brother   . Cancer Brother     History   Social History  . Marital Status: Widowed    Spouse Name: N/A    Number of Children: N/A  . Years of Education: N/A   Occupational History  . Not on file.   Social History Main Topics  . Smoking status: Never Smoker   . Smokeless tobacco: Never Used  . Alcohol Use: No  . Drug Use: No  . Sexual Activity: No   Other Topics Concern  . Not on file   Social History Narrative  . No narrative on file    Review of systems: The patient specifically denies any chest pain at rest or with exertion, dyspnea at rest, orthopnea, paroxysmal nocturnal dyspnea, syncope, palpitations, focal neurological deficits, intermittent claudication, lower extremity edema, unexplained weight gain, cough, hemoptysis or wheezing.  The patient also denies abdominal pain, nausea, vomiting, dysphagia, diarrhea, constipation, polyuria,  polydipsia, dysuria, hematuria, frequency, urgency, abnormal bleeding or bruising, fever, chills, unexpected weight changes, mood swings, change in skin or hair texture, change in voice quality, auditory or visual problems, allergic reactions or rashes, new musculoskeletal complaints other than usual "aches and pains".   PHYSICAL EXAM BP 184/108  Pulse 76  Resp 16  Ht 5' 4.5" (1.638 m)  Wt 82.736 kg (182 lb 6.4 oz)  BMI 30.84 kg/m2 General: Alert, oriented x3, no distress  Head: no evidence of trauma, PERRL, EOMI, no exophtalmos or lid lag, no myxedema, no xanthelasma; normal ears, nose and oropharynx  Neck: normal jugular venous pulsations and no hepatojugular reflux; brisk carotid pulses without delay and no carotid bruits  Chest: clear to auscultation, no signs of consolidation by percussion or palpation, normal fremitus, symmetrical and full respiratory excursions  Cardiovascular: normal position and quality of the apical impulse, regular rhythm, normal first and second heart sounds, no murmurs, rubs or gallops  Abdomen: no tenderness or distention, no masses by palpation, no abnormal pulsatility or arterial bruits, normal bowel sounds, no hepatosplenomegaly  Extremities: no clubbing, cyanosis or edema; 2+ radial, ulnar and brachial pulses bilaterally; 2+ right femoral, posterior tibial and dorsalis pedis pulses; 2+ left femoral, posterior tibial and dorsalis pedis pulses; no subclavian or femoral bruits  Neurological: grossly nonfocal   EKG: NSR  BMET    Component Value Date/Time   NA 133* 05/16/2012 0430   K 3.1* 05/16/2012 0430   CL 95* 05/16/2012 0430   CO2 22 05/16/2012 0430   GLUCOSE 127* 05/16/2012 0430   BUN 22 05/16/2012 0430   CREATININE 0.97 05/16/2012 0430   CALCIUM 10.1 05/16/2012 0430   GFRNONAA 53* 05/16/2012 0430   GFRAA 62* 05/16/2012 0430     ASSESSMENT AND PLAN CHF with cardiomyopathy Karen [name] complaints appear to be caused by cardiomyopathy with mildly depressed left  ventricular systolic function. The etiology of her cardiomyopathy remains uncertain. She has numerous coronary risk factors and at her advanced age could well have coronary disease. The nuclear perfusion study is "normal", but I don't think we can reliably exclude balanced ischemia. I have recommended that she undergo coronary angiography. The risks and benefits of this procedure were discussed with her in detail. She understands and wants to proceed. I told her that if coronary disease is found, it is unlikely to be in a pattern that would be treatable with percutaneous revascularization,  but would more likely require bypass surgery. Alternative explanations for her cardiomyopathy could be systemic hypertension (although her elevated blood pressures have been of relatively recent onset) or idiopathic cardiomyopathy. If blood pressure control continues to be a challenge, consider evaluation for renal artery stenosis(could do an abdominal aortogram at the time of her cardiac catheterization). Hopefully, we will find that she has nonischemic cardiomyopathy. Treatment will focus been on improving blood pressure control, low dose diuretics. She is fortuitously already on appropriate treatment for congestive heart failure with a high dose of ARB as well as sustained release metoprolol.  Aneurysm of aorta She has a 4.3 cm thoracic aortic aneurysm, that is not large enough to warrant surgical intervention at this time.   Orders Placed This Encounter  Procedures  . Comprehensive metabolic panel  . APTT  . Protime-INR  . CBC  . LEFT HEART CATHETERIZATION WITH CORONARY ANGIOGRAM   Meds ordered this encounter  Medications  . QUEtiapine (SEROQUEL) 200 MG tablet    Sig: Take 200 mg by mouth at bedtime.  . Multiple Vitamin (MULTIVITAMIN) tablet    Sig: Take 1 tablet by mouth daily.    Holli Humbles, MD, [place] [phone] office [phone] pager

## 2013-04-12 NOTE — Assessment & Plan Note (Addendum)
Mrs. Canuto complaints appear to be caused by cardiomyopathy with mildly depressed left ventricular systolic function. The etiology of her cardiomyopathy remains uncertain. She has numerous coronary risk factors and at her advanced age could well have coronary disease. The nuclear perfusion study is "normal", but I don't think we can reliably exclude balanced ischemia. I have recommended that she undergo coronary angiography. The risks and benefits of this procedure were discussed with her in detail. She understands and wants to proceed. I told her that if coronary disease is found, it is unlikely to be in a pattern that would be treatable with percutaneous revascularization, but would more likely require bypass surgery. Alternative explanations for her cardiomyopathy could be systemic hypertension (although her elevated blood pressures have been of relatively recent onset) or idiopathic cardiomyopathy. If blood pressure control continues to be a challenge, consider evaluation for renal artery stenosis(could do an abdominal aortogram at the time of her cardiac catheterization). Hopefully, we will find that she has nonischemic cardiomyopathy. Treatment will focus been on improving blood pressure control, low dose diuretics. She is fortuitously already on appropriate treatment for congestive heart failure with a high dose of ARB as well as sustained release metoprolol.

## 2013-04-12 NOTE — Assessment & Plan Note (Signed)
She has a 4.3 cm thoracic aortic aneurysm, that is not large enough to warrant surgical intervention at this time.

## 2013-04-12 NOTE — Patient Instructions (Signed)
Dr. Sallyanne Kuster has requested that you have a cardiac catheterization. Cardiac catheterization is used to diagnose and/or treat various heart conditions. Doctors may recommend this procedure for a number of different reasons. The most common reason is to evaluate chest pain. Chest pain can be a symptom of coronary artery disease (CAD), and cardiac catheterization can show whether plaque is narrowing or blocking your heart's arteries. This procedure is also used to evaluate the valves, as well as measure the blood flow and oxygen levels in different parts of your heart. For further information please visit [email or web address]. Please follow instruction sheet, as given.  Your physician recommends that you return for lab work in:   4-5 days prior to the cardiac catheterization.

## 2013-04-16 ENCOUNTER — Ambulatory Visit: Payer: Medicare Other | Admitting: Gynecology

## 2013-04-18 ENCOUNTER — Ambulatory Visit (INDEPENDENT_AMBULATORY_CARE_PROVIDER_SITE_OTHER): Payer: Medicare Other | Admitting: Gynecology

## 2013-04-18 ENCOUNTER — Encounter: Payer: Self-pay | Admitting: Gynecology

## 2013-04-18 VITALS — BP 130/78 | HR 72 | Resp 14 | Ht 64.5 in | Wt 181.0 lb

## 2013-04-18 DIAGNOSIS — A630 Anogenital (venereal) warts: Secondary | ICD-10-CM | POA: Diagnosis not present

## 2013-04-18 NOTE — Progress Notes (Signed)
Subjective:     Patient ID: Karen [name], female   DOB: 04-10-1928, 80 y.o.   MRN: [id number]  HPI Comments: Pt Without complaints.  Thinks that the new wart may not be responding.  Pt still wants it either removed or treated. Pt reports that she is having a cardiac cath next week-thru the wrist, if there is a left sided blockage, considering CABG.   Reminded that she can not treat.    Review of Systems per HPI     Objective:   Physical Exam  Nursing note and vitals reviewed. Constitutional: She is oriented to person, place, and time. She appears well-developed and well-nourished.  Genitourinary:     Neurological: She is alert and oriented to person, place, and time.       Assessment:     Vulvar warts      Plan:     Pt reassured that treatment is not needed but she does want to treat this new lesion as the last time it got so large so fast. Areas-red- treated with vasoline and 80% TCA to blanching. Tolerated well. Pt to inform us regarding outcome of cath, may want to have the area removed again No appt made today

## 2013-04-19 DIAGNOSIS — D689 Coagulation defect, unspecified: Secondary | ICD-10-CM | POA: Diagnosis not present

## 2013-04-19 DIAGNOSIS — Z79899 Other long term (current) drug therapy: Secondary | ICD-10-CM | POA: Diagnosis not present

## 2013-04-19 LAB — CBC
HEMATOCRIT: 39.8 % (ref 36.0–46.0)
HEMOGLOBIN: 13.3 g/dL (ref 12.0–15.0)
MCH: 32.5 pg (ref 26.0–34.0)
MCHC: 33.4 g/dL (ref 30.0–36.0)
MCV: 97.3 fL (ref 78.0–100.0)
Platelets: 209 10*3/uL (ref 150–400)
RBC: 4.09 MIL/uL (ref 3.87–5.11)
RDW: 14.8 % (ref 11.5–15.5)
WBC: 6.5 10*3/uL (ref 4.0–10.5)

## 2013-04-19 LAB — COMPREHENSIVE METABOLIC PANEL
ALBUMIN: 3.9 g/dL (ref 3.5–5.2)
ALK PHOS: 72 U/L (ref 39–117)
ALT: 12 U/L (ref 0–35)
AST: 16 U/L (ref 0–37)
BILIRUBIN TOTAL: 0.5 mg/dL (ref 0.2–1.2)
BUN: 25 mg/dL — ABNORMAL HIGH (ref 6–23)
CO2: 28 mEq/L (ref 19–32)
Calcium: 9.3 mg/dL (ref 8.4–10.5)
Chloride: 101 mEq/L (ref 96–112)
Creat: 0.89 mg/dL (ref 0.50–1.10)
Glucose, Bld: 191 mg/dL — ABNORMAL HIGH (ref 70–99)
Potassium: 3.6 mEq/L (ref 3.5–5.3)
SODIUM: 139 meq/L (ref 135–145)
TOTAL PROTEIN: 6.8 g/dL (ref 6.0–8.3)

## 2013-04-19 LAB — PROTIME-INR
INR: 1.03 (ref <1.50)
PROTHROMBIN TIME: 13.4 s (ref 11.6–15.2)

## 2013-04-19 LAB — APTT: aPTT: 35 seconds (ref 24–37)

## 2013-04-25 ENCOUNTER — Encounter (HOSPITAL_COMMUNITY)
Admission: RE | Disposition: A | Discharge status: Home / Self Care | Payer: Self-pay | Source: Ambulatory Visit | Attending: Cardiovascular Disease

## 2013-04-25 ENCOUNTER — Ambulatory Visit (HOSPITAL_COMMUNITY)
Admission: RE | Admit: 2013-04-25 | Discharge: 2013-04-25 | Disposition: A | Discharge status: Home / Self Care | Payer: Medicare Other | Source: Ambulatory Visit | Attending: Cardiovascular Disease | Admitting: Cardiovascular Disease

## 2013-04-25 DIAGNOSIS — I10 Essential (primary) hypertension: Secondary | ICD-10-CM

## 2013-04-25 DIAGNOSIS — E119 Type 2 diabetes mellitus without complications: Secondary | ICD-10-CM | POA: Insufficient documentation

## 2013-04-25 DIAGNOSIS — Z7982 Long term (current) use of aspirin: Secondary | ICD-10-CM | POA: Insufficient documentation

## 2013-04-25 DIAGNOSIS — I714 Abdominal aortic aneurysm, without rupture, unspecified: Secondary | ICD-10-CM | POA: Diagnosis not present

## 2013-04-25 DIAGNOSIS — R9439 Abnormal result of other cardiovascular function study: Secondary | ICD-10-CM

## 2013-04-25 DIAGNOSIS — I509 Heart failure, unspecified: Secondary | ICD-10-CM | POA: Diagnosis not present

## 2013-04-25 DIAGNOSIS — E785 Hyperlipidemia, unspecified: Secondary | ICD-10-CM | POA: Insufficient documentation

## 2013-04-25 DIAGNOSIS — I719 Aortic aneurysm of unspecified site, without rupture: Secondary | ICD-10-CM

## 2013-04-25 DIAGNOSIS — I2510 Atherosclerotic heart disease of native coronary artery without angina pectoris: Secondary | ICD-10-CM | POA: Insufficient documentation

## 2013-04-25 DIAGNOSIS — I428 Other cardiomyopathies: Secondary | ICD-10-CM | POA: Insufficient documentation

## 2013-04-25 DIAGNOSIS — I429 Cardiomyopathy, unspecified: Secondary | ICD-10-CM

## 2013-04-25 DIAGNOSIS — Z96659 Presence of unspecified artificial knee joint: Secondary | ICD-10-CM | POA: Insufficient documentation

## 2013-04-25 DIAGNOSIS — R0602 Shortness of breath: Secondary | ICD-10-CM

## 2013-04-25 HISTORY — PX: LEFT HEART CATHETERIZATION WITH CORONARY ANGIOGRAM: SHX5451

## 2013-04-25 LAB — GLUCOSE, CAPILLARY: Glucose-Capillary: 164 mg/dL — ABNORMAL HIGH (ref 70–99)

## 2013-04-25 SURGERY — LEFT HEART CATHETERIZATION WITH CORONARY ANGIOGRAM
Anesthesia: LOCAL

## 2013-04-25 MED ORDER — NITROGLYCERIN 0.2 MG/ML ON CALL CATH LAB
INTRAVENOUS | Status: AC
  Filled 2013-04-25: qty 1

## 2013-04-25 MED ORDER — METOPROLOL TARTRATE 50 MG PO TABS
ORAL_TABLET | ORAL | Status: AC
  Filled 2013-04-25: qty 1

## 2013-04-25 MED ORDER — HEPARIN (PORCINE) IN NACL 2-0.9 UNIT/ML-% IJ SOLN
INTRAMUSCULAR | Status: AC
  Filled 2013-04-25: qty 1000

## 2013-04-25 MED ORDER — LABETALOL HCL 5 MG/ML IV SOLN
INTRAVENOUS | Status: AC
  Filled 2013-04-25: qty 4

## 2013-04-25 MED ORDER — HYDRALAZINE HCL 20 MG/ML IJ SOLN: 10.0000 mg | INTRAMUSCULAR | Status: DC | PRN

## 2013-04-25 MED ORDER — SODIUM CHLORIDE 0.9 % IV SOLN
INTRAVENOUS | Status: DC
  Administered 2013-04-25: 1000 mL via INTRAVENOUS

## 2013-04-25 MED ORDER — METOPROLOL SUCCINATE ER 50 MG PO TB24: 50.0000 mg | ORAL_TABLET | Freq: Every day | ORAL | Status: DC

## 2013-04-25 MED ORDER — VERAPAMIL HCL 2.5 MG/ML IV SOLN
INTRAVENOUS | Status: AC
  Filled 2013-04-25: qty 2

## 2013-04-25 MED ORDER — HEPARIN SODIUM (PORCINE) 1000 UNIT/ML IJ SOLN
INTRAMUSCULAR | Status: AC
  Filled 2013-04-25: qty 1

## 2013-04-25 MED ORDER — HYDRALAZINE HCL 20 MG/ML IJ SOLN
INTRAMUSCULAR | Status: AC
  Filled 2013-04-25: qty 1

## 2013-04-25 MED ORDER — METOPROLOL SUCCINATE ER 50 MG PO TB24
50.0000 mg | ORAL_TABLET | ORAL | Status: AC
  Administered 2013-04-25: 50 mg via ORAL
  Filled 2013-04-25: qty 1

## 2013-04-25 MED ORDER — LIDOCAINE HCL (PF) 1 % IJ SOLN
INTRAMUSCULAR | Status: AC
  Filled 2013-04-25: qty 30

## 2013-04-25 MED ORDER — SODIUM CHLORIDE 0.9 % IJ SOLN: 3.0000 mL | INTRAMUSCULAR | Status: DC | PRN

## 2013-04-25 MED ORDER — SODIUM CHLORIDE 0.9 % IV SOLN: 1.0000 mL/kg/h | INTRAVENOUS | Status: DC

## 2013-04-25 MED ORDER — DIAZEPAM 5 MG PO TABS
5.0000 mg | ORAL_TABLET | ORAL | Status: AC
  Administered 2013-04-25: 5 mg via ORAL
  Filled 2013-04-25: qty 1

## 2013-04-25 MED ORDER — FUROSEMIDE 40 MG PO TABS: 40.0000 mg | ORAL_TABLET | Freq: Every day | ORAL | Status: DC

## 2013-04-25 NOTE — Progress Notes (Signed)
UP AND WALKED AND TOL WELL; RIGHT GROIN STABLE; NOT BLEEDING AND NO HEMATOMA

## 2013-04-25 NOTE — Op Note (Signed)
CARDIAC CATHETERIZATION REPORT   Procedures performed:  1. Left heart catheterization  2. Selective coronary angiography  3. Left ventriculography  4. Abdominal aortic Angiogram  Reason for procedure:  Congestive heart failure  Procedure performed by: Sanda Klein, MD, [facility]  Complications: none   Estimated blood loss: less than 5 mL   History:  Karen [name] present with dyspnea and fatigue. Both her echocardiogram and her nuclear perfusion study showed mildly depressed left ventricle systolic function with an ejection fraction of around 45%. She does not have chest pain. The nuclear study did not show any evidence of regional abnormalities in perfusion. She has long-standing systemic hypertension, but until recently this was very well treated and was felt to be mild. She also has diabetes mellitus and hyperlipidemia, but has never smoked. There is a suspicion for multivessel CAD with "balanced ischemia".   Consent: The risks, benefits, and details of the procedure were explained to the patient. Risks including death, MI, stroke, bleeding, limb ischemia, renal failure and allergy were described and accepted by the patient. Informed written consent was obtained prior to proceeding.  Technique: The patient was brought to the cardiac catheterization laboratory in the fasting state. He was prepped and draped in the usual sterile fashion. Local anesthesia with 1% lidocaine was administered to the right wrist area. Using the modified Seldinger technique a 5 French right radial artery sheath was introduced without difficulty. The guide wire and catheter were advanced to the ascending aorta with slight difficulty due to a very tortuous right subclavian-innominate arterial course. Despite multiple attempts with a variety of catheters, the coronaries could not be engaged from the radial approach,so this course was abandoned. The right groin area had also been prepped and was anesthetized with 1%  lidocaine. A 5 French right common femoral artery sheath was introduced without difficulty, using the modified Seldinger technique. Under fluoroscopic guidance, using 5 [place] JL5, JR and angled pigtail catheters, selective cannulation of the left coronary artery, right coronary artery and left ventricle were respectively performed. Several coronary angiograms in a variety of projections were recorded, as well as a left ventriculogram in the LAO projection, In order to outline her thoracic aortic aneurysm. Left ventricular pressure and a pull back to the aorta were recorded. No immediate complications occurred. At the end of the procedure, all catheters were removed. After the procedure, hemostasis will be achieved with manual pressure.  Contrast used: 150 mL Omnipaque Verapamil 5 mg nitroglycerin 200 mcg intra-arterially (radial sheath). Labetalol 30 mg and hydralazine 20 mg intravenously for hypertension. Lidocaine 1% 15 mL locally for anesthesia  Angiographic Findings:  1. The left main coronary artery bifurcates in the usual fashion into the left anterior descending artery and left circumflex coronary artery. It is calcified and exhibits moderate atherosclerosis with a roughly 30-40% distal stenosis 2. The left anterior descending artery is a large vessel that reaches the apex and generates 38major diagonal branches. There is evidence of Moderate luminal irregularities and Heavy calcification. There is a long segment of heavily calcified plaque in the proximal LAD artery generating a diffuse 40% stenosis. There is a 90% stenosis at the level of the second diagonal artery at the junction of the mid and distal LAD. 3. The left circumflex coronary artery is a Large-size vessel Non- dominant vessel that generates 3 major oblique marginal arteries, The proximal one being smaller. There is evidence of Extensive luminal irregularities and Heavy calcification.There is a 70% stenosis after the first oblique  marginal artery. There is  a 90% stenosis in the proximal third oblique marginal artery. 4. The right coronary artery is a Medium to large-size dominant vessel that generates a Long posterior lateral ventricular system as well as the PDA. There is evidence of Extensive luminal irregularities and Heavy calcification. There is a 40-50% stenosis in the mid right coronary artery there is a 20-80% stenosis in the mid posterior descending artery. 5. The left ventricle is normal in size. The left ventricle systolic function is Mildly decreased with an estimated ejection fraction of 50%. Regional wall motion abnormalities are Not seen. No left ventricular thrombus is seen. There is No mitral insufficiency. The ascending aorta appears Calcified and mildly aneurysmal. There is no aortic valve stenosis by pullback. The left ventricular end-diastolic pressure is 30 mm Hg.   The descending thoracic aorta is tortuous, heavily calcified and moderately dilated. The abdominal aorta is normal in caliber and has diffuse atherosclerotic irregularitiesAnd calcification. There is no evidence of significant renal artery stenosis. There is an eccentric 30% stenosis in the mid right renal artery. The proximal portion of the right iliac artery is not well-visualized Secondary to shadowing from screws in the lumbar spine.   IMPRESSIONS:  Karen [name] has three-vessel disease with "balanced ischemia". However the coronary lesions are fairly distal (roughly 2/3 of the way down the left anterior descending coronary artery, the distal oblique marginal artery and the posterior descending artery). Left ventricular systolic function is only minimally decreased. There is evidence of significant diastolic dysfunction. Her aortic calcification increases potential risk with coronary bypass surgery. RECOMMENDATION:  I believe this patient is best suited for medical therapy at this time. Her age and aortic disease place her at increased risk  of complications with bypass surgery in the overall extent of myocardium in jeopardy is low. I do not think she would benefit from percutaneous revascularization in the absence of angina pectoris. While surgical bypass could theoretically offer a survival advantage, there is a nontrivial risk of serious complications. We'll discuss this with the patient and her family in detail and would also ask for an outpatient surgical opinion.     Sanda Klein, MD, [facility] [phone] office [phone] pager 04/25/2013 9:47 AM

## 2013-04-25 NOTE — Discharge Instructions (Signed)
Radial Site Care Refer to this sheet in the next few weeks. These instructions provide you with information on caring for yourself after your procedure. Your caregiver may also give you more specific instructions. Your treatment has been planned according to current medical practices, but problems sometimes occur. Call your caregiver if you have any problems or questions after your procedure. HOME CARE INSTRUCTIONS  You may shower the day after the procedure.Remove the bandage (dressing) and gently wash the site with plain soap and water.Gently pat the site dry.  Do not apply powder or lotion to the site.  Do not submerge the affected site in water for 3 to 5 days.  Inspect the site at least twice daily.  Do not flex or bend the affected arm for 24 hours.  No lifting over 5 pounds (2.3 kg) for 5 days after your procedure.  Do not drive home if you are discharged the same day of the procedure. Have someone else drive you.  You may drive 24 hours after the procedure unless otherwise instructed by your caregiver.  Do not operate machinery or power tools for 24 hours.  A responsible adult should be with you for the first 24 hours after you arrive home. What to expect:  Any bruising will usually fade within 1 to 2 weeks.  Blood that collects in the tissue (hematoma) may be painful to the touch. It should usually decrease in size and tenderness within 1 to 2 weeks. SEEK IMMEDIATE MEDICAL CARE IF:  You have unusual pain at the radial site.  You have redness, warmth, swelling, or pain at the radial site.  You have drainage (other than a small amount of blood on the dressing).  You have chills.  You have a fever or persistent symptoms for more than 72 hours.  You have a fever and your symptoms suddenly get worse.  Your arm becomes pale, cool, tingly, or numb.  You have heavy bleeding from the site. Hold pressure on the site. Document Released: 02/27/2010 Document Revised:  04/19/2011 Document Reviewed: 02/27/2010 [facility] Patient Information 2014 [place], [place]. Angiography, Care After Refer to this sheet in the next few weeks. These instructions provide you with information on caring for yourself after your procedure. Your health care provider may also give you more specific instructions. Your treatment has been planned according to current medical practices, but problems sometimes occur. Call your health care provider if you have any problems or questions after your procedure.  WHAT TO EXPECT AFTER THE PROCEDURE After your procedure, it is typical to have the following sensations:  Minor discomfort or tenderness and a small bump at the catheter insertion site. The bump should usually decrease in size and tenderness within 1 to 2 weeks.  Any bruising will usually fade within 2 to 4 weeks. HOME CARE INSTRUCTIONS   You may need to keep taking blood thinners if they were prescribed for you. Only take over-the-counter or prescription medicines for pain, fever, or discomfort as directed by your health care provider.  Do not apply powder or lotion to the site.  Do not sit in a bathtub, swimming pool, or whirlpool for 5 to 7 days.  You may shower 24 hours after the procedure. Remove the bandage (dressing) and gently wash the site with plain soap and water. Gently pat the site dry.  Inspect the site at least twice daily.  Limit your activity for the first 48 hours. Do not bend, squat, or lift anything over 20 lb (9 kg)  or as directed by your health care provider.  Do not drive home if you are discharged the day of the procedure. Have someone else drive you. Follow instructions about when you can drive or return to work. SEEK MEDICAL CARE IF:  You get lightheaded when standing up.  You have drainage (other than a small amount of blood on the dressing).  You have chills.  You have a fever.  You have redness, warmth, swelling, or pain at the insertion  site. SEEK IMMEDIATE MEDICAL CARE IF:   You develop chest pain or shortness of breath, feel faint, or pass out.  You have bleeding, swelling larger than a walnut, or drainage from the catheter insertion site.  You develop pain, discoloration, coldness, or severe bruising in the leg or arm that held the catheter.  You develop bleeding from any other place, such as the bowels. You may see bright red blood in your urine or stools, or your stools may appear black and tarry.  You have heavy bleeding from the site. If this happens, hold pressure on the site. MAKE SURE YOU:  Understand these instructions.  Will watch your condition.  Will get help right away if you are not doing well or get worse. Document Released: 08/13/2004 Document Revised: 09/27/2012 Document Reviewed: 06/19/2012 [facility] Patient Information 2014 [place].   NO METFORMIN FOR 2 DAYS

## 2013-04-25 NOTE — H&P (View-Only) (Signed)
Patient ID: Karen [name], female   DOB: 1928-11-27, 80 y.o.   MRN: [id number]      Reason for office visit Followup echocardiogram and nuclear myocardial perfusion study  Karen [name] continues to express dyspnea and fatigue. Both her echocardiogram and her nuclear perfusion study showed mildly depressed left ventricle systolic function with an ejection fraction of around 45%. She does not have chest pain. The nuclear study did not show any evidence of regional abnormalities in perfusion. She has long-standing systemic hypertension, but until recently this was very well treated and was felt to be mild. She also has diabetes mellitus and hyperlipidemia, but has never smoked.   Allergies  Allergen Reactions  . Codeine Other (See Comments)    Unknown   . Lisinopril Cough  . Morphine And Related Other (See Comments)    Unknown   . Ultram [Tramadol Hcl] Itching    Current Outpatient Prescriptions  Medication Sig Dispense Refill  . aspirin 81 MG tablet Take 81 mg by mouth daily.        . Calcium Carbonate-Vitamin D (CALCIUM 600+D) 600-400 MG-UNIT per tablet Take 1 tablet by mouth daily.      Marland Kitchen docusate sodium (COLACE) 100 MG capsule Take 100 mg by mouth daily.      . magnesium oxide (MAG-OX) 400 MG tablet Take 400 mg by mouth daily.      . meloxicam (MOBIC) 7.5 MG tablet Take 7.5 mg by mouth 2 (two) times daily.       . metFORMIN (GLUCOPHAGE) 500 MG tablet Take 500 mg by mouth 2 (two) times daily with a meal.        . metoprolol succinate (TOPROL-XL) 25 MG 24 hr tablet Take 25 mg by mouth daily. Patient takes after dinner      . mirtazapine (REMERON) 15 MG tablet Take 15 mg by mouth at bedtime.      . Multiple Vitamin (MULTIVITAMIN) tablet Take 1 tablet by mouth daily.      . potassium chloride SA (K-DUR,KLOR-CON) 20 MEQ tablet Take 20 mEq by mouth daily.       . QUEtiapine (SEROQUEL) 200 MG tablet Take 200 mg by mouth at bedtime.      . simvastatin (ZOCOR) 20 MG tablet Take 20 mg  by mouth at bedtime.        . sitaGLIPtin (JANUVIA) 100 MG tablet Take 100 mg by mouth daily.      Marland Kitchen telmisartan (MICARDIS) 80 MG tablet Take 80 mg by mouth daily.       No current facility-administered medications for this visit.    Past Medical History  Diagnosis Date  . Diabetes mellitus   . Hypertension   . H/O: gout   . Fibromyalgia   . Constipation   . Hx: UTI (urinary tract infection)   . Arthritis     Past Surgical History  Procedure Laterality Date  . Knee surgery  1997/2000  . Replacement total hip w/  resurfacing implants  [id number]  . Spinal fusion  2006  . Joint replacement  left hip-2007, left knee-2000, right knee-1997    Left Hip, Bilateral Knee replacements  . Back surgery  1972  . Closed reduction of left total hip  2008    x 3  . Right foot surgery  2004    x 2  . Appendectomy  1951  . Cholecystectomy  1951  . Hernia repair  1963  . Salivary gland stone extraction      Family  History  Problem Relation Age of Onset  . Heart attack Father   . Stroke Father   . Cancer Mother   . Heart attack Brother   . Cancer Brother     History   Social History  . Marital Status: Widowed    Spouse Name: N/A    Number of Children: N/A  . Years of Education: N/A   Occupational History  . Not on file.   Social History Main Topics  . Smoking status: Never Smoker   . Smokeless tobacco: Never Used  . Alcohol Use: No  . Drug Use: No  . Sexual Activity: No   Other Topics Concern  . Not on file   Social History Narrative  . No narrative on file    Review of systems: The patient specifically denies any chest pain at rest or with exertion, dyspnea at rest, orthopnea, paroxysmal nocturnal dyspnea, syncope, palpitations, focal neurological deficits, intermittent claudication, lower extremity edema, unexplained weight gain, cough, hemoptysis or wheezing.  The patient also denies abdominal pain, nausea, vomiting, dysphagia, diarrhea, constipation, polyuria,  polydipsia, dysuria, hematuria, frequency, urgency, abnormal bleeding or bruising, fever, chills, unexpected weight changes, mood swings, change in skin or hair texture, change in voice quality, auditory or visual problems, allergic reactions or rashes, new musculoskeletal complaints other than usual "aches and pains".   PHYSICAL EXAM BP 184/108  Pulse 76  Resp 16  Ht 5' 4.5" (1.638 m)  Wt 82.736 kg (182 lb 6.4 oz)  BMI 30.84 kg/m2 General: Alert, oriented x3, no distress  Head: no evidence of trauma, PERRL, EOMI, no exophtalmos or lid lag, no myxedema, no xanthelasma; normal ears, nose and oropharynx  Neck: normal jugular venous pulsations and no hepatojugular reflux; brisk carotid pulses without delay and no carotid bruits  Chest: clear to auscultation, no signs of consolidation by percussion or palpation, normal fremitus, symmetrical and full respiratory excursions  Cardiovascular: normal position and quality of the apical impulse, regular rhythm, normal first and second heart sounds, no murmurs, rubs or gallops  Abdomen: no tenderness or distention, no masses by palpation, no abnormal pulsatility or arterial bruits, normal bowel sounds, no hepatosplenomegaly  Extremities: no clubbing, cyanosis or edema; 2+ radial, ulnar and brachial pulses bilaterally; 2+ right femoral, posterior tibial and dorsalis pedis pulses; 2+ left femoral, posterior tibial and dorsalis pedis pulses; no subclavian or femoral bruits  Neurological: grossly nonfocal   EKG: NSR  BMET    Component Value Date/Time   NA 133* 05/16/2012 0430   K 3.1* 05/16/2012 0430   CL 95* 05/16/2012 0430   CO2 22 05/16/2012 0430   GLUCOSE 127* 05/16/2012 0430   BUN 22 05/16/2012 0430   CREATININE 0.97 05/16/2012 0430   CALCIUM 10.1 05/16/2012 0430   GFRNONAA 53* 05/16/2012 0430   GFRAA 62* 05/16/2012 0430     ASSESSMENT AND PLAN CHF with cardiomyopathy Karen [name] complaints appear to be caused by cardiomyopathy with mildly depressed left  ventricular systolic function. The etiology of her cardiomyopathy remains uncertain. She has numerous coronary risk factors and at her advanced age could well have coronary disease. The nuclear perfusion study is "normal", but I don't think we can reliably exclude balanced ischemia. I have recommended that she undergo coronary angiography. The risks and benefits of this procedure were discussed with her in detail. She understands and wants to proceed. I told her that if coronary disease is found, it is unlikely to be in a pattern that would be treatable with percutaneous revascularization,  but would more likely require bypass surgery. Alternative explanations for her cardiomyopathy could be systemic hypertension (although her elevated blood pressures have been of relatively recent onset) or idiopathic cardiomyopathy. If blood pressure control continues to be a challenge, consider evaluation for renal artery stenosis(could do an abdominal aortogram at the time of her cardiac catheterization). Hopefully, we will find that she has nonischemic cardiomyopathy. Treatment will focus been on improving blood pressure control, low dose diuretics. She is fortuitously already on appropriate treatment for congestive heart failure with a high dose of ARB as well as sustained release metoprolol.  Aneurysm of aorta She has a 4.3 cm thoracic aortic aneurysm, that is not large enough to warrant surgical intervention at this time.   Orders Placed This Encounter  Procedures  . Comprehensive metabolic panel  . APTT  . Protime-INR  . CBC  . LEFT HEART CATHETERIZATION WITH CORONARY ANGIOGRAM   Meds ordered this encounter  Medications  . QUEtiapine (SEROQUEL) 200 MG tablet    Sig: Take 200 mg by mouth at bedtime.  . Multiple Vitamin (MULTIVITAMIN) tablet    Sig: Take 1 tablet by mouth daily.    Holli Humbles, MD, [place] [phone] office [phone] pager

## 2013-04-25 NOTE — Interval H&P Note (Signed)
History and Physical Interval Note:  04/25/2013 10:25 AM  Karen [name]  has presented today for surgery, with the diagnosis of shortness of breath  The various methods of treatment have been discussed with the patient and family. After consideration of risks, benefits and other options for treatment, the patient has consented to  Procedure(s): LEFT HEART CATHETERIZATION WITH CORONARY ANGIOGRAM (N/A) as a surgical intervention .  The patient's history has been reviewed, patient examined, no change in status, stable for surgery.  I have reviewed the patient's chart and labs.  Questions were answered to the patient's satisfaction.     Karen [name]

## 2013-05-02 ENCOUNTER — Ambulatory Visit (INDEPENDENT_AMBULATORY_CARE_PROVIDER_SITE_OTHER): Payer: Medicare Other | Admitting: Gynecology

## 2013-05-02 ENCOUNTER — Encounter: Payer: Self-pay | Admitting: Gynecology

## 2013-05-02 VITALS — BP 130/70 | HR 68 | Resp 12 | Ht 64.5 in | Wt 178.0 lb

## 2013-05-02 DIAGNOSIS — A630 Anogenital (venereal) warts: Secondary | ICD-10-CM | POA: Diagnosis not present

## 2013-05-02 NOTE — Progress Notes (Signed)
Subjective:     Patient ID: Karen [name], female   DOB: 18-May-1928, 80 y.o.   MRN: [id number]  HPI Comments: Pt Without complaints.  Thinks that the new wart may not be responding.  Pt reports that she had some sloughing of the skin after we treated last time as we were more aggressive.  No issues with pain, lesions or drainage.  Pt reports that she is had a cardiac cath last week, some distal blockage noted but for now they are treating with medications only.  Gynecologic Exam     Review of Systems Per hpi    Objective:   Physical Exam  Nursing note and vitals reviewed. Constitutional: She is oriented to person, place, and time. She appears well-developed and well-nourished.  Genitourinary:     Neurological: She is alert and oriented to person, place, and time.       Assessment:     Vulvar wart     Plan:     Responded well to more aggressive treatment, will retreat today. Surrounding tissue treated with petroleum jelly, 80% TCA applied to  q-tip, pressed firmly to lesion with marked blaching Tolerated well. F/u 2w, no longer considering removal

## 2013-05-10 ENCOUNTER — Ambulatory Visit (INDEPENDENT_AMBULATORY_CARE_PROVIDER_SITE_OTHER): Payer: Medicare Other | Admitting: Cardiovascular Disease

## 2013-05-10 ENCOUNTER — Encounter: Payer: Self-pay | Admitting: Cardiovascular Disease

## 2013-05-10 VITALS — BP 190/90 | HR 75 | Resp 22 | Ht 64.5 in | Wt 178.9 lb

## 2013-05-10 DIAGNOSIS — I509 Heart failure, unspecified: Secondary | ICD-10-CM

## 2013-05-10 DIAGNOSIS — I10 Essential (primary) hypertension: Secondary | ICD-10-CM | POA: Diagnosis not present

## 2013-05-10 DIAGNOSIS — E119 Type 2 diabetes mellitus without complications: Secondary | ICD-10-CM | POA: Diagnosis not present

## 2013-05-10 DIAGNOSIS — I428 Other cardiomyopathies: Secondary | ICD-10-CM | POA: Diagnosis not present

## 2013-05-10 DIAGNOSIS — I2589 Other forms of chronic ischemic heart disease: Secondary | ICD-10-CM

## 2013-05-10 DIAGNOSIS — I2510 Atherosclerotic heart disease of native coronary artery without angina pectoris: Secondary | ICD-10-CM | POA: Diagnosis not present

## 2013-05-10 DIAGNOSIS — I255 Ischemic cardiomyopathy: Secondary | ICD-10-CM

## 2013-05-10 DIAGNOSIS — E785 Hyperlipidemia, unspecified: Secondary | ICD-10-CM

## 2013-05-10 DIAGNOSIS — I429 Cardiomyopathy, unspecified: Secondary | ICD-10-CM

## 2013-05-10 MED ORDER — FUROSEMIDE 40 MG PO TABS: 80.0000 mg | ORAL_TABLET | Freq: Every day | ORAL | Status: DC

## 2013-05-10 MED ORDER — AMLODIPINE BESYLATE 2.5 MG PO TABS
2.5000 mg | ORAL_TABLET | Freq: Every day | ORAL | Status: DC
Start: 2013-05-10 — End: 2013-06-01

## 2013-05-10 NOTE — Patient Instructions (Signed)
Dr Sallyanne Kuster has recommended making the following medication changes:  Increase Furosemide to 80mg  daily.  START Amlodipine 2.5mg  daily.  Dr. Sallyanne Kuster recommends that you schedule a follow-up appointment in: 3 weeks.

## 2013-05-11 ENCOUNTER — Ambulatory Visit: Payer: Medicare Other | Admitting: Cardiovascular Disease

## 2013-05-18 ENCOUNTER — Ambulatory Visit (INDEPENDENT_AMBULATORY_CARE_PROVIDER_SITE_OTHER): Payer: Medicare Other | Admitting: Gynecology

## 2013-05-18 ENCOUNTER — Encounter: Payer: Self-pay | Admitting: Gynecology

## 2013-05-18 VITALS — BP 140/82 | HR 78 | Resp 12 | Ht 64.5 in | Wt 179.0 lb

## 2013-05-18 DIAGNOSIS — A630 Anogenital (venereal) warts: Secondary | ICD-10-CM | POA: Diagnosis not present

## 2013-05-18 NOTE — Progress Notes (Signed)
Subjective:     Patient ID: Karen [name], female   DOB: May 22, 1928, 80 y.o.   MRN: [id number]  HPI Comments: Pt here for follow up, feeling much better, reports that the wart "fell off". She had an increae in her cardiac medications in leiu of CABG and is adjusting well.    Review of Systems  Constitutional: Positive for fatigue. Negative for fever and chills.       Objective:   Physical Exam  Nursing note and vitals reviewed. Constitutional: She is oriented to person, place, and time. She appears well-developed and well-nourished.  Genitourinary:     Neurological: She is alert and oriented to person, place, and time.       Assessment:     Vulvar wart resolved     Plan:     F/u prn

## 2013-05-27 ENCOUNTER — Encounter: Payer: Self-pay | Admitting: Cardiovascular Disease

## 2013-05-27 DIAGNOSIS — E7800 Pure hypercholesterolemia, unspecified: Secondary | ICD-10-CM | POA: Insufficient documentation

## 2013-05-27 DIAGNOSIS — I255 Ischemic cardiomyopathy: Secondary | ICD-10-CM | POA: Insufficient documentation

## 2013-05-27 DIAGNOSIS — E785 Hyperlipidemia, unspecified: Secondary | ICD-10-CM | POA: Insufficient documentation

## 2013-05-27 DIAGNOSIS — I2510 Atherosclerotic heart disease of native coronary artery without angina pectoris: Secondary | ICD-10-CM | POA: Insufficient documentation

## 2013-05-27 NOTE — Progress Notes (Signed)
Patient ID: Karen [name], female   DOB: 26-Aug-1928, 80 y.o.   MRN: [id number]      Reason for office visit CAD, followup after cardiac catheterization  Karen [name] presented with complaints of fatigue and dyspnea. Noninvasive studies showed depressed left ventricular systolic function and she therefore underwent coronary angiography. The angiogram showed evidence of coronary stenoses in all 3 major coronary territories but the lesions were fairly downstream, so I was felt that medical therapy would be more appropriate than bypass surgery. Evidence of increased end-diastolic filling pressures. Even though she has been taking furosemide she has not really had any net diuretic effect, her weight is unchanged and she still of breath. Her blood pressure is also elevated. On arrival it was 190/90, but by the time that our interview concluded her blood pressure had come down to 130/80 mm Hg.   Allergies  Allergen Reactions  . Codeine Other (See Comments)    Unknown   . Lisinopril Cough  . Morphine And Related Other (See Comments)    Unknown   . Pneumococcal Vaccines Swelling and Other (See Comments)    Very bad pain in the arm of the shot  . Ultram [Tramadol Hcl] Itching    Current Outpatient Prescriptions  Medication Sig Dispense Refill  . aspirin 81 MG tablet Take 81 mg by mouth daily.        . Calcium Carbonate-Vitamin D (CALCIUM 600+D) 600-400 MG-UNIT per tablet Take 1 tablet by mouth daily.      Marland Kitchen docusate sodium (COLACE) 100 MG capsule Take 100 mg by mouth daily.      . magnesium oxide (MAG-OX) 400 MG tablet Take 400 mg by mouth daily.      . meloxicam (MOBIC) 7.5 MG tablet Take 7.5 mg by mouth 2 (two) times daily.       . metFORMIN (GLUCOPHAGE) 500 MG tablet Take 500 mg by mouth 2 (two) times daily with a meal.        . metoprolol succinate (TOPROL-XL) 50 MG 24 hr tablet Take 1 tablet (50 mg total) by mouth daily. Take with or immediately following a meal.  90 tablet  3  .  mirtazapine (REMERON) 15 MG tablet Take 15 mg by mouth at bedtime.      . Multiple Vitamin (MULTIVITAMIN) tablet Take 1 tablet by mouth daily.      Vladimir Faster Glycol-Propyl Glycol (SYSTANE OP) Place 1 drop into both eyes daily as needed (dry eyes).      . potassium chloride SA (K-DUR,KLOR-CON) 20 MEQ tablet Take 20 mEq by mouth daily.       . QUEtiapine (SEROQUEL) 200 MG tablet Take 200 mg by mouth at bedtime.      . simvastatin (ZOCOR) 20 MG tablet Take 20 mg by mouth at bedtime.        . sitaGLIPtin (JANUVIA) 100 MG tablet Take 100 mg by mouth daily.      Marland Kitchen telmisartan (MICARDIS) 80 MG tablet Take 80 mg by mouth daily.      Marland Kitchen amLODipine (NORVASC) 2.5 MG tablet Take 1 tablet (2.5 mg total) by mouth daily.  30 tablet  6  . furosemide (LASIX) 80 MG tablet Take 80 mg by mouth.       No current facility-administered medications for this visit.    Past Medical History  Diagnosis Date  . Diabetes mellitus   . Hypertension   . H/O: gout   . Fibromyalgia   . Constipation   . Hx:  UTI (urinary tract infection)   . Arthritis     Past Surgical History  Procedure Laterality Date  . Knee surgery  1997/2000  . Replacement total hip w/  resurfacing implants  [id number]  . Spinal fusion  2006  . Joint replacement  left hip-2007, left knee-2000, right knee-1997    Left Hip, Bilateral Knee replacements  . Back surgery  1972  . Closed reduction of left total hip  2008    x 3  . Right foot surgery  2004    x 2  . Appendectomy  1951  . Cholecystectomy  1951  . Hernia repair  1963  . Salivary gland stone extraction    . Cardiac catheterization  2015    Family History  Problem Relation Age of Onset  . Heart attack Father   . Stroke Father   . Cancer Mother   . Heart attack Brother   . Cancer Brother     History   Social History  . Marital Status: Widowed    Spouse Name: N/A    Number of Children: N/A  . Years of Education: N/A   Occupational History  . Not on file.   Social  History Main Topics  . Smoking status: Never Smoker   . Smokeless tobacco: Never Used  . Alcohol Use: No  . Drug Use: No  . Sexual Activity: No   Other Topics Concern  . Not on file   Social History Narrative  . No narrative on file   Review of systems: The patient specifically denies any chest pain at rest or with exertion, dyspnea at rest, orthopnea, paroxysmal nocturnal dyspnea, syncope, palpitations, focal neurological deficits, intermittent claudication, lower extremity edema, unexplained weight gain, cough, hemoptysis or wheezing.  The patient also denies abdominal pain, nausea, vomiting, dysphagia, diarrhea, constipation, polyuria, polydipsia, dysuria, hematuria, frequency, urgency, abnormal bleeding or bruising, fever, chills, unexpected weight changes, mood swings, change in skin or hair texture, change in voice quality, auditory or visual problems, allergic reactions or rashes, new musculoskeletal complaints other than usual "aches and pains".  PHYSICAL EXAM BP 190/90  Pulse 75  Resp 22  Ht 5' 4.5" (1.638 m)  Wt 178 lb 14.4 oz (81.149 kg)  BMI 30.25 kg/m2 General: Alert, oriented x3, no distress  Head: no evidence of trauma, PERRL, EOMI, no exophtalmos or lid lag, no myxedema, no xanthelasma; normal ears, nose and oropharynx  Neck: normal jugular venous pulsations and no hepatojugular reflux; brisk carotid pulses without delay and no carotid bruits  Chest: clear to auscultation, no signs of consolidation by percussion or palpation, normal fremitus, symmetrical and full respiratory excursions  Cardiovascular: normal position and quality of the apical impulse, regular rhythm, normal first and second heart sounds, no murmurs, rubs or gallops  Abdomen: no tenderness or distention, no masses by palpation, no abnormal pulsatility or arterial bruits, normal bowel sounds, no hepatosplenomegaly  Extremities: no clubbing, cyanosis or edema; 2+ radial, ulnar and brachial pulses  bilaterally; 2+ right femoral, posterior tibial and dorsalis pedis pulses; 2+ left femoral, posterior tibial and dorsalis pedis pulses; no subclavian or femoral bruits   Both the aborted right radial catheterization access site and the eventual right femoral artery access site appeared well-healed without hematoma.   EKG: NSR   CATH 1. The left main coronary artery roughly 30-40% distal stenosis  2. The left anterior descending artery proximal LAD artery generating a diffuse calcified 40% stenosis; 90% stenosis at the level of the second diagonal artery, junction  of mid and distal LAD.  3. The left circumflex coronary artery extensive luminal irregularities and heavy calcification; 70% stenosis after the first oblique marginal artery; 90% stenosis in the proximal third oblique marginal artery.  4. The right coronary artery  40-50% stenosis in the mid right coronary artery; 70-80% stenosis in the mid posterior descending artery.  5. The left ventricle estimated ejection fraction of 50%. The ascending aorta appears Calcified and mildly aneurysmal. The left ventricular end-diastolic pressure is 30 mm Hg.    BMET    Component Value Date/Time   NA 139 04/19/2013 0916   K 3.6 04/19/2013 0916   CL 101 04/19/2013 0916   CO2 28 04/19/2013 0916   GLUCOSE 191* 04/19/2013 0916   BUN 25* 04/19/2013 0916   CREATININE 0.89 04/19/2013 0916   CREATININE 0.97 05/16/2012 0430   CALCIUM 9.3 04/19/2013 0916   GFRNONAA 53* 05/16/2012 0430   GFRAA 62* 05/16/2012 0430     ASSESSMENT AND PLAN Acute on chronic combined systolic and diastolic congestive heart failure NYHA class II. She remains hypervolemic. She has not lost any fluid. Increase furosemide to 80 mg daily she is on angiotensin receptor blockers and beta blockers.  Cardiomyopathy, mixed hypertensive and ischemic etiology EF 45-50%, diffuse hypokinesis  HTN (hypertension) Add amlodipine 2.5 mg daily  Diabetes mellitus Note potential for worsening by  neuroleptics  Hyperlipidemia On statin therapy. Reevaluate in a couple of months.  CAD (coronary artery disease) She is not an optimal candidate for bypass surgery since she has a calcified descending aorta, advanced age and since her coronary lesions are fairly distal. This may have to be pursued though, if we cannot achieve improvement with medical therapy.   No orders of the defined types were placed in this encounter.   Meds ordered this encounter  Medications  . DISCONTD: furosemide (LASIX) 40 MG tablet    Sig: Take 2 tablets (80 mg total) by mouth daily.    Dispense:  30 tablet    Refill:  3  . amLODipine (NORVASC) 2.5 MG tablet    Sig: Take 1 tablet (2.5 mg total) by mouth daily.    Dispense:  30 tablet    Refill:  6    Reinhold Rickey  Sanda Klein, MD, [facility] [phone] office [phone] pager

## 2013-05-27 NOTE — Assessment & Plan Note (Signed)
NYHA class II. She remains hypervolemic. She has not lost any fluid. Increase furosemide to 80 mg daily she is on angiotensin receptor blockers and beta blockers.

## 2013-05-27 NOTE — Assessment & Plan Note (Signed)
Note potential for worsening by neuroleptics

## 2013-05-27 NOTE — Assessment & Plan Note (Signed)
Add amlodipine 2.5 mg daily

## 2013-05-27 NOTE — Assessment & Plan Note (Signed)
She is not an optimal candidate for bypass surgery since she has a calcified descending aorta, advanced age and since her coronary lesions are fairly distal. This may have to be pursued though, if we cannot achieve improvement with medical therapy.

## 2013-05-27 NOTE — Assessment & Plan Note (Signed)
EF 45-50%, diffuse hypokinesis

## 2013-05-27 NOTE — Assessment & Plan Note (Signed)
On statin therapy. Reevaluate in a couple of months.

## 2013-06-01 ENCOUNTER — Ambulatory Visit (INDEPENDENT_AMBULATORY_CARE_PROVIDER_SITE_OTHER): Payer: Medicare Other | Admitting: Cardiovascular Disease

## 2013-06-01 ENCOUNTER — Encounter: Payer: Self-pay | Admitting: Cardiovascular Disease

## 2013-06-01 VITALS — BP 146/78 | HR 80 | Resp 16 | Ht 64.5 in | Wt 176.9 lb

## 2013-06-01 DIAGNOSIS — I719 Aortic aneurysm of unspecified site, without rupture: Secondary | ICD-10-CM

## 2013-06-01 DIAGNOSIS — I2510 Atherosclerotic heart disease of native coronary artery without angina pectoris: Secondary | ICD-10-CM

## 2013-06-01 DIAGNOSIS — I5043 Acute on chronic combined systolic (congestive) and diastolic (congestive) heart failure: Secondary | ICD-10-CM

## 2013-06-01 DIAGNOSIS — I255 Ischemic cardiomyopathy: Secondary | ICD-10-CM

## 2013-06-01 DIAGNOSIS — I2589 Other forms of chronic ischemic heart disease: Secondary | ICD-10-CM | POA: Diagnosis not present

## 2013-06-01 DIAGNOSIS — I10 Essential (primary) hypertension: Secondary | ICD-10-CM

## 2013-06-01 DIAGNOSIS — I509 Heart failure, unspecified: Secondary | ICD-10-CM

## 2013-06-01 MED ORDER — AMLODIPINE BESYLATE 5 MG PO TABS: 5.0000 mg | ORAL_TABLET | Freq: Every day | ORAL | Status: DC

## 2013-06-01 MED ORDER — FUROSEMIDE 80 MG PO TABS: 80.0000 mg | ORAL_TABLET | Freq: Every day | ORAL | Status: DC

## 2013-06-01 MED ORDER — METFORMIN HCL 500 MG PO TABS: 1000.0000 mg | ORAL_TABLET | Freq: Two times a day (BID) | ORAL | Status: DC

## 2013-06-01 NOTE — Patient Instructions (Signed)
Increase the Amlodipine to 5mg  daily.  Increase Metformin to 1000mg  twice a day.  Dr. Sallyanne Kuster recommends that you schedule a follow-up appointment in: end of May 2015.

## 2013-06-02 ENCOUNTER — Encounter: Payer: Self-pay | Admitting: Cardiovascular Disease

## 2013-06-02 NOTE — Progress Notes (Signed)
Patient ID: Karen [name], female   DOB: Aug 25, 1928, 80 y.o.   MRN: [id number]      Reason for office visit Diastolic heart failure, CAD, hypertension  Karen [name] presented with complaints of fatigue and dyspnea. Noninvasive studies showed depressed left ventricular systolic function and she therefore underwent coronary angiography. The angiogram showed evidence of coronary stenoses in all 3 major coronary territories, but the lesions were fairly downstream, so it was felt that medical therapy would be more appropriate than bypass surgery. Evidence of increased end-diastolic filling pressures was clearly present. Her blood pressure has been markedly elevated, but is better.   She has improved clinical symptoms since her last visit and she expresses an interest in starting a program of physical exercise. She is considering buying a treadmill or similar device. She weighs 2 pounds less than her previous appointment. She does not have overt edema. She has had a notable diuretic effect when the furosemide dose was increased. The effect of the morning diuretic last about 6 hours but then she still wakes up in the middle of the night to urinate several times. Her glucose remains elevated.  Allergies  Allergen Reactions  . Codeine Other (See Comments)    Unknown   . Lisinopril Cough  . Morphine And Related Other (See Comments)    Unknown   . Pneumococcal Vaccines Swelling and Other (See Comments)    Very bad pain in the arm of the shot  . Ultram [Tramadol Hcl] Itching    Current Outpatient Prescriptions  Medication Sig Dispense Refill  . aspirin 81 MG tablet Take 81 mg by mouth daily.        . Calcium Carbonate-Vitamin D (CALCIUM 600+D) 600-400 MG-UNIT per tablet Take 1 tablet by mouth daily.      Marland Kitchen docusate sodium (COLACE) 100 MG capsule Take 100 mg by mouth daily.      . furosemide (LASIX) 80 MG tablet Take 1 tablet (80 mg total) by mouth daily.  30 tablet  6  . magnesium oxide  (MAG-OX) 400 MG tablet Take 400 mg by mouth daily.      . meloxicam (MOBIC) 7.5 MG tablet Take 7.5 mg by mouth 2 (two) times daily.       . metFORMIN (GLUCOPHAGE) 500 MG tablet Take 2 tablets (1,000 mg total) by mouth 2 (two) times daily with a meal.  60 tablet  6  . metoprolol succinate (TOPROL-XL) 50 MG 24 hr tablet Take 1 tablet (50 mg total) by mouth daily. Take with or immediately following a meal.  90 tablet  3  . mirtazapine (REMERON) 15 MG tablet Take 15 mg by mouth at bedtime.      . Multiple Vitamin (MULTIVITAMIN) tablet Take 1 tablet by mouth daily.      Vladimir Faster Glycol-Propyl Glycol (SYSTANE OP) Place 1 drop into both eyes daily as needed (dry eyes).      . potassium chloride SA (K-DUR,KLOR-CON) 20 MEQ tablet Take 20 mEq by mouth daily.       . QUEtiapine (SEROQUEL) 200 MG tablet Take 200 mg by mouth at bedtime.      . simvastatin (ZOCOR) 20 MG tablet Take 20 mg by mouth at bedtime.        . sitaGLIPtin (JANUVIA) 100 MG tablet Take 100 mg by mouth daily.      Marland Kitchen telmisartan (MICARDIS) 80 MG tablet Take 80 mg by mouth daily.      Marland Kitchen amLODipine (NORVASC) 5 MG tablet Take 1  tablet (5 mg total) by mouth daily.  30 tablet  5   No current facility-administered medications for this visit.    Past Medical History  Diagnosis Date  . Diabetes mellitus   . Hypertension   . H/O: gout   . Fibromyalgia   . Constipation   . Hx: UTI (urinary tract infection)   . Arthritis     Past Surgical History  Procedure Laterality Date  . Knee surgery  1997/2000  . Replacement total hip w/  resurfacing implants  [id number]  . Spinal fusion  2006  . Joint replacement  left hip-2007, left knee-2000, right knee-1997    Left Hip, Bilateral Knee replacements  . Back surgery  1972  . Closed reduction of left total hip  2008    x 3  . Right foot surgery  2004    x 2  . Appendectomy  1951  . Cholecystectomy  1951  . Hernia repair  1963  . Salivary gland stone extraction    . Cardiac  catheterization  2015    Family History  Problem Relation Age of Onset  . Heart attack Father   . Stroke Father   . Cancer Mother   . Heart attack Brother   . Cancer Brother     History   Social History  . Marital Status: Widowed    Spouse Name: N/A    Number of Children: N/A  . Years of Education: N/A   Occupational History  . Not on file.   Social History Main Topics  . Smoking status: Never Smoker   . Smokeless tobacco: Never Used  . Alcohol Use: No  . Drug Use: No  . Sexual Activity: No   Other Topics Concern  . Not on file   Social History Narrative  . No narrative on file    Review of systems: Social class 2-3 exertional dyspnea The patient specifically denies any chest pain at rest or with exertion, dyspnea at rest, orthopnea, paroxysmal nocturnal dyspnea, syncope, palpitations, focal neurological deficits, intermittent claudication, lower extremity edema, unexplained weight gain, cough, hemoptysis or wheezing.  The patient also denies abdominal pain, nausea, vomiting, dysphagia, diarrhea, constipation, polyuria, polydipsia, dysuria, hematuria, frequency, urgency, abnormal bleeding or bruising, fever, chills, unexpected weight changes, mood swings, change in skin or hair texture, change in voice quality, auditory or visual problems, allergic reactions or rashes, new musculoskeletal complaints other than usual "aches and pains".   PHYSICAL EXAM BP 146/78  Pulse 80  Resp 16  Ht 5' 4.5" (1.638 m)  Wt 176 lb 14.4 oz (80.241 kg)  BMI 29.91 kg/m2 General: Alert, oriented x3, no distress  Head: no evidence of trauma, PERRL, EOMI, no exophtalmos or lid lag, no myxedema, no xanthelasma; normal ears, nose and oropharynx  Neck: normal jugular venous pulsations and no hepatojugular reflux; brisk carotid pulses without delay and no carotid bruits  Chest: clear to auscultation, no signs of consolidation by percussion or palpation, normal fremitus, symmetrical and full  respiratory excursions  Cardiovascular: normal position and quality of the apical impulse, regular rhythm, normal first and second heart sounds, no murmurs, rubs or gallops  Abdomen: no tenderness or distention, no masses by palpation, no abnormal pulsatility or arterial bruits, normal bowel sounds, no hepatosplenomegaly  Extremities: no clubbing, cyanosis or edema; 2+ radial, ulnar and brachial pulses bilaterally; 2+ right femoral, posterior tibial and dorsalis pedis pulses; 2+ left femoral, posterior tibial and dorsalis pedis pulses; no subclavian or femoral bruits    EKG:  NSR   CATH  1. The left main coronary artery roughly 30-40% distal stenosis  2. The left anterior descending artery proximal LAD artery generating a diffuse calcified 40% stenosis; 90% stenosis at the level of the second diagonal artery, junction of mid and distal LAD.  3. The left circumflex coronary artery extensive luminal irregularities and heavy calcification; 70% stenosis after the first oblique marginal artery; 90% stenosis in the proximal third oblique marginal artery.  4. The right coronary artery 40-50% stenosis in the mid right coronary artery; 70-80% stenosis in the mid posterior descending artery.  5. The left ventricle estimated ejection fraction of 50%. The ascending aorta appears Calcified and mildly aneurysmal. The left ventricular end-diastolic pressure is 30 mm Hg.   BMET    Component Value Date/Time   NA 139 04/19/2013 0916   K 3.6 04/19/2013 0916   CL 101 04/19/2013 0916   CO2 28 04/19/2013 0916   GLUCOSE 191* 04/19/2013 0916   BUN 25* 04/19/2013 0916   CREATININE 0.89 04/19/2013 0916   CREATININE 0.97 05/16/2012 0430   CALCIUM 9.3 04/19/2013 0916   GFRNONAA 53* 05/16/2012 0430   GFRAA 62* 05/16/2012 0430     ASSESSMENT AND PLAN  Chronic combined systolic and diastolic congestive heart failure  NYHA class II. She remains hypervolemic. She has not lost any fluid. Increase furosemide to 80 mg daily she is  on angiotensin receptor blockers and beta blockers.  Cardiomyopathy, mixed hypertensive and ischemic etiology  EF 45-50%, diffuse hypokinesis  HTN (hypertension)  Increase amlodipine 5 mg daily  Diabetes mellitus  Note potential for worsening by neuroleptics. Her nocturia is not likely related to diuretic therapy but due to persistent glucosuria. Advised her to increase metformin 2000 mg twice a day Hyperlipidemia  On statin therapy. Reevaluate in a couple of months.  CAD (coronary artery disease)  She is not an optimal candidate for bypass surgery since she has a calcified descending aorta, advanced age and since her coronary lesions are fairly distal. This may have to be pursued though, if we cannot achieve improvement with medical therapy.  Patient Instructions  Increase the Amlodipine to 5mg  daily.  Increase Metformin to 1000mg  twice a day.  Dr. Sallyanne Kuster recommends that you schedule a follow-up appointment in: end of May 2015.      Meds ordered this encounter  Medications  . amLODipine (NORVASC) 5 MG tablet    Sig: Take 1 tablet (5 mg total) by mouth daily.    Dispense:  30 tablet    Refill:  5  . metFORMIN (GLUCOPHAGE) 500 MG tablet    Sig: Take 2 tablets (1,000 mg total) by mouth 2 (two) times daily with a meal.    Dispense:  60 tablet    Refill:  6  . furosemide (LASIX) 80 MG tablet    Sig: Take 1 tablet (80 mg total) by mouth daily.    Dispense:  30 tablet    Refill:  6    Kevin Mario  Sanda Klein, MD, [facility] [phone] office [phone] pager

## 2013-07-05 ENCOUNTER — Encounter: Payer: Self-pay | Admitting: Cardiovascular Disease

## 2013-07-05 ENCOUNTER — Ambulatory Visit (INDEPENDENT_AMBULATORY_CARE_PROVIDER_SITE_OTHER): Payer: Medicare Other | Admitting: Cardiovascular Disease

## 2013-07-05 VITALS — BP 138/88 | HR 84 | Resp 16 | Ht 64.5 in | Wt 178.0 lb

## 2013-07-05 DIAGNOSIS — R0602 Shortness of breath: Secondary | ICD-10-CM | POA: Diagnosis not present

## 2013-07-05 DIAGNOSIS — Z79899 Other long term (current) drug therapy: Secondary | ICD-10-CM

## 2013-07-05 DIAGNOSIS — I2510 Atherosclerotic heart disease of native coronary artery without angina pectoris: Secondary | ICD-10-CM

## 2013-07-05 MED ORDER — FUROSEMIDE 40 MG PO TABS: 80.0000 mg | ORAL_TABLET | Freq: Every day | ORAL | Status: DC

## 2013-07-05 MED ORDER — AMLODIPINE BESYLATE 5 MG PO TABS: 5.0000 mg | ORAL_TABLET | Freq: Every day | ORAL | Status: DC

## 2013-07-05 MED ORDER — ASPIRIN 81 MG PO TABS: 81.0000 mg | ORAL_TABLET | Freq: Every day | ORAL | Status: DC

## 2013-07-05 MED ORDER — NEBIVOLOL HCL 10 MG PO TABS: 10.0000 mg | ORAL_TABLET | Freq: Every day | ORAL | Status: DC

## 2013-07-05 NOTE — Patient Instructions (Addendum)
Stop Metoprolol.  Start Bystolic 10mg  daily and see if the [place] carries and covers this medication.  Your physician recommends that you return for lab work in: at [facility].  Dr. Sallyanne Kuster recommends that you schedule a follow-up appointment in: 6-8 weeks

## 2013-07-06 LAB — COMPREHENSIVE METABOLIC PANEL
ALT: 18 U/L (ref 0–35)
AST: 20 U/L (ref 0–37)
Albumin: 4.5 g/dL (ref 3.5–5.2)
Alkaline Phosphatase: 85 U/L (ref 39–117)
BILIRUBIN TOTAL: 0.5 mg/dL (ref 0.2–1.2)
BUN: 23 mg/dL (ref 6–23)
CALCIUM: 9.6 mg/dL (ref 8.4–10.5)
CHLORIDE: 98 meq/L (ref 96–112)
CO2: 28 meq/L (ref 19–32)
Creat: 0.85 mg/dL (ref 0.50–1.10)
GLUCOSE: 137 mg/dL — AB (ref 70–99)
Potassium: 3.5 mEq/L (ref 3.5–5.3)
Sodium: 140 mEq/L (ref 135–145)
TOTAL PROTEIN: 7.5 g/dL (ref 6.0–8.3)

## 2013-07-06 LAB — PRO B NATRIURETIC PEPTIDE: PRO B NATRI PEPTIDE: 229.6 pg/mL (ref <451)

## 2013-07-07 ENCOUNTER — Encounter: Payer: Self-pay | Admitting: Cardiovascular Disease

## 2013-07-07 NOTE — Progress Notes (Signed)
Patient ID: Karen [name], female   DOB: April 16, 1928, 80 y.o.   MRN: [id number]      Reason for office visit CAD, diastolic CHF  Karen [name] presented with complaints of fatigue and dyspnea. Noninvasive studies showed depressed left ventricular systolic function and she therefore underwent coronary angiography. The angiogram showed evidence of coronary stenoses in all 3 major coronary territories, but the lesions were fairly downstream, so it was felt that medical therapy would be more appropriate than bypass surgery. Evidence of increased end-diastolic filling pressures was clearly present. Her blood pressure has been markedly elevated, but is now finally in the "normal" range, although still higher than desired.   Biggest complaint remains marked fatigue.  Allergies  Allergen Reactions  . Codeine Other (See Comments)    Unknown   . Lisinopril Cough  . Morphine And Related Other (See Comments)    Unknown   . Pneumococcal Vaccines Swelling and Other (See Comments)    Very bad pain in the arm of the shot  . Ultram [Tramadol Hcl] Itching    Current Outpatient Prescriptions  Medication Sig Dispense Refill  . amLODipine (NORVASC) 5 MG tablet Take 1 tablet (5 mg total) by mouth daily.  90 tablet  3  . aspirin 81 MG tablet Take 1 tablet (81 mg total) by mouth daily.  90 tablet  3  . Calcium Carbonate-Vitamin D (CALCIUM 600+D) 600-400 MG-UNIT per tablet Take 1 tablet by mouth daily.      Marland Kitchen docusate sodium (COLACE) 100 MG capsule Take 100 mg by mouth daily.      . furosemide (LASIX) 40 MG tablet Take 2 tablets (80 mg total) by mouth daily.  180 tablet  3  . magnesium oxide (MAG-OX) 400 MG tablet Take 400 mg by mouth daily.      . meloxicam (MOBIC) 7.5 MG tablet Take 7.5 mg by mouth 2 (two) times daily.       . metFORMIN (GLUCOPHAGE) 500 MG tablet Take 2 tablets (1,000 mg total) by mouth 2 (two) times daily with a meal.  60 tablet  6  . mirtazapine (REMERON) 15 MG tablet Take 15 mg by  mouth at bedtime.      . Multiple Vitamin (MULTIVITAMIN) tablet Take 1 tablet by mouth daily.      . nebivolol (BYSTOLIC) 10 MG tablet Take 1 tablet (10 mg total) by mouth daily.  30 tablet  0  . Polyethyl Glycol-Propyl Glycol (SYSTANE OP) Place 1 drop into both eyes daily as needed (dry eyes).      . potassium chloride SA (K-DUR,KLOR-CON) 20 MEQ tablet Take 20 mEq by mouth daily.       . QUEtiapine (SEROQUEL) 200 MG tablet Take 200 mg by mouth at bedtime.      . simvastatin (ZOCOR) 20 MG tablet Take 20 mg by mouth at bedtime.        . sitaGLIPtin (JANUVIA) 100 MG tablet Take 100 mg by mouth daily.      Marland Kitchen telmisartan (MICARDIS) 80 MG tablet Take 80 mg by mouth daily.       No current facility-administered medications for this visit.    Past Medical History  Diagnosis Date  . Diabetes mellitus   . Hypertension   . H/O: gout   . Fibromyalgia   . Constipation   . Hx: UTI (urinary tract infection)   . Arthritis     Past Surgical History  Procedure Laterality Date  . Knee surgery  1997/2000  . Replacement  total hip w/  resurfacing implants  [id number]  . Spinal fusion  2006  . Joint replacement  left hip-2007, left knee-2000, right knee-1997    Left Hip, Bilateral Knee replacements  . Back surgery  1972  . Closed reduction of left total hip  2008    x 3  . Right foot surgery  2004    x 2  . Appendectomy  1951  . Cholecystectomy  1951  . Hernia repair  1963  . Salivary gland stone extraction    . Cardiac catheterization  2015    Family History  Problem Relation Age of Onset  . Heart attack Father   . Stroke Father   . Cancer Mother   . Heart attack Brother   . Cancer Brother     History   Social History  . Marital Status: Widowed    Spouse Name: N/A    Number of Children: N/A  . Years of Education: N/A   Occupational History  . Not on file.   Social History Main Topics  . Smoking status: Never Smoker   . Smokeless tobacco: Never Used  . Alcohol Use: No  .  Drug Use: No  . Sexual Activity: No   Other Topics Concern  . Not on file   Social History Narrative  . No narrative on file    Review of systems: NYHA class 2-3 exertional dyspnea . Fatigue is the dominant complaint  The patient specifically denies any chest pain at rest or with exertion, dyspnea at rest, orthopnea, paroxysmal nocturnal dyspnea, syncope, palpitations, focal neurological deficits, intermittent claudication, lower extremity edema, unexplained weight gain, cough, hemoptysis or wheezing.  The patient also denies abdominal pain, nausea, vomiting, dysphagia, diarrhea, constipation, polyuria, polydipsia, dysuria, hematuria, frequency, urgency, abnormal bleeding or bruising, fever, chills, unexpected weight changes, mood swings, change in skin or hair texture, change in voice quality, auditory or visual problems, allergic reactions or rashes, new musculoskeletal complaints other than usual "aches and pains".   PHYSICAL EXAM BP 138/88  Pulse 84  Resp 16  Ht 5' 4.5" (1.638 m)  Wt 178 lb (80.74 kg)  BMI 30.09 kg/m2 General: Alert, oriented x3, no distress  Head: no evidence of trauma, PERRL, EOMI, no exophtalmos or lid lag, no myxedema, no xanthelasma; normal ears, nose and oropharynx  Neck: normal jugular venous pulsations and no hepatojugular reflux; brisk carotid pulses without delay and no carotid bruits  Chest: clear to auscultation, no signs of consolidation by percussion or palpation, normal fremitus, symmetrical and full respiratory excursions  Cardiovascular: normal position and quality of the apical impulse, regular rhythm, normal first and second heart sounds, no murmurs, rubs or gallops  Abdomen: no tenderness or distention, no masses by palpation, no abnormal pulsatility or arterial bruits, normal bowel sounds, no hepatosplenomegaly  Extremities: no clubbing, cyanosis or edema; 2+ radial, ulnar and brachial pulses bilaterally; 2+ right femoral, posterior tibial and  dorsalis pedis pulses; 2+ left femoral, posterior tibial and dorsalis pedis pulses; no subclavian or femoral bruits    EKG: NSR   CATH  1. The left main coronary artery roughly 30-40% distal stenosis  2. The left anterior descending artery proximal LAD artery generating a diffuse calcified 40% stenosis; 90% stenosis at the level of the second diagonal artery, junction of mid and distal LAD.  3. The left circumflex coronary artery extensive luminal irregularities and heavy calcification; 70% stenosis after the first oblique marginal artery; 90% stenosis in the proximal third oblique marginal artery.  4. The right coronary artery 40-50% stenosis in the mid right coronary artery; 70-80% stenosis in the mid posterior descending artery.  5. The left ventricle estimated ejection fraction of 50%. The ascending aorta appears Calcified and mildly aneurysmal. The left ventricular end-diastolic pressure is 30 mm Hg.   BMET    Component Value Date/Time   NA 140 07/05/2013 1658   K 3.5 07/05/2013 1658   CL 98 07/05/2013 1658   CO2 28 07/05/2013 1658   GLUCOSE 137* 07/05/2013 1658   BUN 23 07/05/2013 1658   CREATININE 0.85 07/05/2013 1658   CREATININE 0.97 05/16/2012 0430   CALCIUM 9.6 07/05/2013 1658   GFRNONAA 53* 05/16/2012 0430   GFRAA 62* 05/16/2012 0430     ASSESSMENT AND PLAN Chronic combined systolic and diastolic congestive heart failure  NYHA class II. She probably remains hypervolemic, having lost only about 4 or 5 pounds since the diagnosis was made. We'll check a BNP, but unfortunately do not have a baseline value. Also need to recheck her kidney function. We'll try to switch from metoprolol to Bystolic to see if this leads to improvement in her fatigue. Cardiomyopathy, mixed hypertensive and ischemic etiology  EF 45-50%, diffuse hypokinesis  HTN (hypertension)  Increase amlodipine 5 mg daily  Diabetes mellitus  Note potential for worsening by neuroleptics. Her nocturia is not likely related  to diuretic therapy but due to persistent glucosuria. Advised her to increase metformin 2000 mg twice a day  Hyperlipidemia  On statin therapy. Reevaluate in a couple of months.  CAD (coronary artery disease)  She is not an optimal candidate for bypass surgery since she has a calcified ascending aorta, advanced age and since her coronary lesions are fairly distal. This may have to be pursued though, if we cannot achieve symptom improvement with medical therapy.   Orders Placed This Encounter  Procedures  . Comp Met (CMET)  . Pro b natriuretic peptide (BNP)   Meds ordered this encounter  Medications  . aspirin 81 MG tablet    Sig: Take 1 tablet (81 mg total) by mouth daily.    Dispense:  90 tablet    Refill:  3  . furosemide (LASIX) 40 MG tablet    Sig: Take 2 tablets (80 mg total) by mouth daily.    Dispense:  180 tablet    Refill:  3  . amLODipine (NORVASC) 5 MG tablet    Sig: Take 1 tablet (5 mg total) by mouth daily.    Dispense:  90 tablet    Refill:  3  . nebivolol (BYSTOLIC) 10 MG tablet    Sig: Take 1 tablet (10 mg total) by mouth daily.    Dispense:  30 tablet    Refill:  0    ZOX#[id number] EXP:8/15    Renette Hsu  Sanda Klein, MD, [facility] [phone] office [phone] pager

## 2013-07-09 DIAGNOSIS — H35039 Hypertensive retinopathy, unspecified eye: Secondary | ICD-10-CM | POA: Diagnosis not present

## 2013-07-09 DIAGNOSIS — H3560 Retinal hemorrhage, unspecified eye: Secondary | ICD-10-CM | POA: Diagnosis not present

## 2013-09-13 ENCOUNTER — Ambulatory Visit (INDEPENDENT_AMBULATORY_CARE_PROVIDER_SITE_OTHER): Payer: Medicare Other | Admitting: Cardiovascular Disease

## 2013-09-13 ENCOUNTER — Encounter: Payer: Self-pay | Admitting: Cardiovascular Disease

## 2013-09-13 VITALS — BP 150/90 | HR 80 | Resp 16 | Ht 64.5 in | Wt 175.0 lb

## 2013-09-13 DIAGNOSIS — I2589 Other forms of chronic ischemic heart disease: Secondary | ICD-10-CM | POA: Diagnosis not present

## 2013-09-13 DIAGNOSIS — I255 Ischemic cardiomyopathy: Secondary | ICD-10-CM

## 2013-09-13 DIAGNOSIS — I2510 Atherosclerotic heart disease of native coronary artery without angina pectoris: Secondary | ICD-10-CM

## 2013-09-13 DIAGNOSIS — E785 Hyperlipidemia, unspecified: Secondary | ICD-10-CM | POA: Diagnosis not present

## 2013-09-13 DIAGNOSIS — I10 Essential (primary) hypertension: Secondary | ICD-10-CM

## 2013-09-13 DIAGNOSIS — I509 Heart failure, unspecified: Secondary | ICD-10-CM

## 2013-09-13 DIAGNOSIS — I5043 Acute on chronic combined systolic (congestive) and diastolic (congestive) heart failure: Secondary | ICD-10-CM

## 2013-09-13 MED ORDER — VALSARTAN 320 MG PO TABS: 320.0000 mg | ORAL_TABLET | Freq: Every day | ORAL | Status: DC

## 2013-09-13 MED ORDER — AMLODIPINE BESYLATE 10 MG PO TABS: 10.0000 mg | ORAL_TABLET | Freq: Every day | ORAL | Status: DC

## 2013-09-13 MED ORDER — METFORMIN HCL 1000 MG PO TABS: 1000.0000 mg | ORAL_TABLET | Freq: Two times a day (BID) | ORAL | Status: DC

## 2013-09-13 MED ORDER — SIMVASTATIN 20 MG PO TABS: 20.0000 mg | ORAL_TABLET | Freq: Every day | ORAL | Status: DC

## 2013-09-13 NOTE — Patient Instructions (Addendum)
Increase Amlodipine to 10mg  daily.    Stop Micardis.  Start Valsartan 320mg  daily.  Follow-up with Dr. Martin[place].  Dr. Sallyanne Kuster recommends that you schedule a follow-up appointment in: 6 months.

## 2013-09-16 ENCOUNTER — Encounter: Payer: Self-pay | Admitting: Cardiovascular Disease

## 2013-09-16 NOTE — Progress Notes (Signed)
Patient ID: Karen [name], female   DOB: [birth year], 80 y.o.   MRN: [id number]     Reason for office visit Coronary artery disease, diastolic heart failure, hypertension  [name] has not had any improvement in her symptoms. She continues to complain of fatigue and exertional dyspnea as well as weak legs. She also has pain in her right hip over the last 6 weeks. Switching from metoprolol to Bystolic did not have any positive impact on her fatigue, just like a "statin holiday" did not help either.  She becomes short of breath removing close from the dryer and avoids walking up and down stairs. She is going to June 2 for tract pickup her medications. Her medical insurance has given her notice that they will no longer cover telmisartan and we will switch her to valsartan  Cardiac catheterization performed in March 2015 showed a 90% stenosis in the mid to distal LAD at the origin of the second diagonal artery, a 70% stenosis in the left circumflex coronary artery after the first oblique marginal artery and a 90% stenosis in a relatively small third oblique marginal artery and a 80% stenosis in the mid posterior descending artery. Left ventricular ejection fraction was 50%  [name] presented with complaints of fatigue and dyspnea. Noninvasive studies showed depressed left ventricular systolic function and she therefore underwent coronary angiography. The angiogram showed evidence of coronary stenoses in all 3 major coronary territories, but the lesions were mostly fairly downstream, so it was felt that medical therapy would be more appropriate than bypass surgery. Evidence of increased end-diastolic filling pressures was clearly present. Her blood pressure has been markedly elevated, but is now finally in the "normal" range, although still higher than desired.   I think we have given medical therapy a good try over the last 4-5 months. She has not responded. I think her dyspnea may be an  angina equivalent. Reviewed the images with Dr. Peter Martinique and discussed options for revascularization. We are in agreement that bypass surgery seems excessively risky procedure for what is not really threatening CAD. Will schedule the patient to meet with Dr. Martinique to discuss PCI/stent of the left circumflex stenosis and possibly also the mid to distal LAD stenosis   Allergies  Allergen Reactions  . Codeine Other (See Comments)    Unknown   . Lisinopril Cough  . Morphine And Related Other (See Comments)    Unknown   . Pneumococcal Vaccines Swelling and Other (See Comments)    Very bad pain in the arm of the shot  . Ultram [Tramadol Hcl] Itching    Current Outpatient Prescriptions  Medication Sig Dispense Refill  . amLODipine (NORVASC) 10 MG tablet Take 1 tablet (10 mg total) by mouth daily.  90 tablet  3  . aspirin 81 MG tablet Take 1 tablet (81 mg total) by mouth daily.  90 tablet  3  . Calcium Carbonate-Vitamin D (CALCIUM 600+D) 600-400 MG-UNIT per tablet Take 1 tablet by mouth daily.      Marland Kitchen docusate sodium (COLACE) 100 MG capsule Take 100 mg by mouth daily.      . furosemide (LASIX) 40 MG tablet Take 2 tablets (80 mg total) by mouth daily.  180 tablet  3  . magnesium oxide (MAG-OX) 400 MG tablet Take 400 mg by mouth daily.      . meloxicam (MOBIC) 7.5 MG tablet Take 7.5 mg by mouth 2 (two) times daily.       . metoprolol succinate (  TOPROL-XL) 50 MG 24 hr tablet Take 50 mg by mouth daily. Take with or immediately following a meal.      . mirtazapine (REMERON) 15 MG tablet Take 15 mg by mouth at bedtime.      . Multiple Vitamin (MULTIVITAMIN) tablet Take 1 tablet by mouth daily.      Vladimir Faster Glycol-Propyl Glycol (SYSTANE OP) Place 1 drop into both eyes daily as needed (dry eyes).      . potassium chloride SA (K-DUR,KLOR-CON) 20 MEQ tablet Take 20 mEq by mouth daily.       . QUEtiapine (SEROQUEL) 200 MG tablet Take 200 mg by mouth at bedtime.      . simvastatin (ZOCOR) 20 MG  tablet Take 1 tablet (20 mg total) by mouth at bedtime.  90 tablet  3  . sitaGLIPtin (JANUVIA) 100 MG tablet Take 100 mg by mouth daily.      . metFORMIN (GLUCOPHAGE) 1000 MG tablet Take 1 tablet (1,000 mg total) by mouth 2 (two) times daily with a meal.  60 tablet  0  . valsartan (DIOVAN) 320 MG tablet Take 1 tablet (320 mg total) by mouth daily.  90 tablet  3   No current facility-administered medications for this visit.    Past Medical History  Diagnosis Date  . Diabetes mellitus   . Hypertension   . H/O: gout   . Fibromyalgia   . Constipation   . Hx: UTI (urinary tract infection)   . Arthritis     Past Surgical History  Procedure Laterality Date  . Knee surgery  1997/2000  . Replacement total hip w/  resurfacing implants  [id number]  . Spinal fusion  2006  . Joint replacement  left hip-2007, left knee-2000, right knee-1997    Left Hip, Bilateral Knee replacements  . Back surgery  1972  . Closed reduction of left total hip  2008    x 3  . Right foot surgery  2004    x 2  . Appendectomy  1951  . Cholecystectomy  1951  . Hernia repair  1963  . Salivary gland stone extraction    . Cardiac catheterization  2015    Family History  Problem Relation Age of Onset  . Heart attack Father   . Stroke Father   . Cancer Mother   . Heart attack Brother   . Cancer Brother     History   Social History  . Marital Status: Widowed    Spouse Name: N/A    Number of Children: N/A  . Years of Education: N/A   Occupational History  . Not on file.   Social History Main Topics  . Smoking status: Never Smoker   . Smokeless tobacco: Never Used  . Alcohol Use: No  . Drug Use: No  . Sexual Activity: No   Other Topics Concern  . Not on file   Social History Narrative  . No narrative on file    Review of systems: NYHA class 2-3 exertional dyspnea . Fatigue is the dominant complaint  The patient specifically denies any chest pain at rest or with exertion, dyspnea at rest,  orthopnea, paroxysmal nocturnal dyspnea, syncope, palpitations, focal neurological deficits, intermittent claudication, lower extremity edema, unexplained weight gain, cough, hemoptysis or wheezing.  The patient also denies abdominal pain, nausea, vomiting, dysphagia, diarrhea, constipation, polyuria, polydipsia, dysuria, hematuria, frequency, urgency, abnormal bleeding or bruising, fever, chills, unexpected weight changes, mood swings, change in skin or hair texture, change in voice quality,  auditory or visual problems, allergic reactions or rashes, new musculoskeletal complaints other than usual "aches and pains".   PHYSICAL EXAM BP 150/90  Pulse 80  Resp 16  Ht 5' 4.5" (1.638 m)  Wt 175 lb (79.379 kg)  BMI 29.59 kg/m2 General: Alert, oriented x3, no distress  Head: no evidence of trauma, PERRL, EOMI, no exophtalmos or lid lag, no myxedema, no xanthelasma; normal ears, nose and oropharynx  Neck: normal jugular venous pulsations and no hepatojugular reflux; brisk carotid pulses without delay and no carotid bruits  Chest: clear to auscultation, no signs of consolidation by percussion or palpation, normal fremitus, symmetrical and full respiratory excursions  Cardiovascular: normal position and quality of the apical impulse, regular rhythm, normal first and second heart sounds, no murmurs, rubs or gallops  Abdomen: no tenderness or distention, no masses by palpation, no abnormal pulsatility or arterial bruits, normal bowel sounds, no hepatosplenomegaly  Extremities: no clubbing, cyanosis or edema; 2+ radial, ulnar and brachial pulses bilaterally; 2+ right femoral, posterior tibial and dorsalis pedis pulses; 2+ left femoral, posterior tibial and dorsalis pedis pulses; no subclavian or femoral bruits    EKG: NSR     BMET    Component Value Date/Time   NA 140 07/05/2013 1658   K 3.5 07/05/2013 1658   CL 98 07/05/2013 1658   CO2 28 07/05/2013 1658   GLUCOSE 137* 07/05/2013 1658   BUN 23  07/05/2013 1658   CREATININE 0.85 07/05/2013 1658   CREATININE 0.97 05/16/2012 0430   CALCIUM 9.6 07/05/2013 1658   GFRNONAA 53* 05/16/2012 0430   GFRAA 62* 05/16/2012 0430     ASSESSMENT AND PLAN   Chronic combined systolic and diastolic congestive heart failure  NYHA class II. no overt physical signs of hypervolemia today  Cardiomyopathy, mixed hypertensive and ischemic etiology  EF 45-50%, diffuse hypokinesis   HTN (hypertension)  Still not perfectly controlled. Increase amlodipine 10 mg daily . Switched to valsartan due to formulary constraints  Diabetes mellitus  Note potential for worsening by neuroleptics.   Hyperlipidemia  On statin therapy. Statin holiday did not help her fatigue  CAD (coronary artery disease)  She is not an optimal candidate for bypass surgery since she has a calcified ascending aorta, advanced age and since her coronary lesions are fairly distal. Refer for PCI of LCX +/-LAD   Patient Instructions  Increase Amlodipine to 10mg  daily.    Stop Micardis.  Start Valsartan 320mg  daily.  Follow-up with Dr. Martinique.  Dr. Sallyanne Kuster recommends that you schedule a follow-up appointment in: 6 months.      Meds ordered this encounter  Medications  . metoprolol succinate (TOPROL-XL) 50 MG 24 hr tablet    Sig: Take 50 mg by mouth daily. Take with or immediately following a meal.  . amLODipine (NORVASC) 10 MG tablet    Sig: Take 1 tablet (10 mg total) by mouth daily.    Dispense:  90 tablet    Refill:  3  . valsartan (DIOVAN) 320 MG tablet    Sig: Take 1 tablet (320 mg total) by mouth daily.    Dispense:  90 tablet    Refill:  3  . DISCONTD: metFORMIN (GLUCOPHAGE) 1000 MG tablet    Sig: Take 1 tablet (1,000 mg total) by mouth 2 (two) times daily with a meal.    Dispense:  180 tablet    Refill:  3  . metFORMIN (GLUCOPHAGE) 1000 MG tablet    Sig: Take 1 tablet (1,000 mg total) by  mouth 2 (two) times daily with a meal.    Dispense:  60 tablet    Refill:  0   . simvastatin (ZOCOR) 20 MG tablet    Sig: Take 1 tablet (20 mg total) by mouth at bedtime.    Dispense:  90 tablet    Refill:  Bayside Ilyana Manuele, MD, [facility] [phone] office [phone] pager

## 2013-09-17 ENCOUNTER — Telehealth: Payer: Self-pay

## 2013-09-17 NOTE — Telephone Encounter (Signed)
Spoke to patient appointment scheduled with Dr.Jordan to set up cardiac cath 10/09/13 at 12:00 noon.

## 2013-09-20 DIAGNOSIS — M1990 Unspecified osteoarthritis, unspecified site: Secondary | ICD-10-CM | POA: Diagnosis not present

## 2013-09-20 DIAGNOSIS — M25559 Pain in unspecified hip: Secondary | ICD-10-CM | POA: Diagnosis not present

## 2013-09-20 DIAGNOSIS — Z96649 Presence of unspecified artificial hip joint: Secondary | ICD-10-CM | POA: Diagnosis not present

## 2013-09-20 DIAGNOSIS — M25519 Pain in unspecified shoulder: Secondary | ICD-10-CM | POA: Diagnosis not present

## 2013-09-20 DIAGNOSIS — M1610 Unilateral primary osteoarthritis, unspecified hip: Secondary | ICD-10-CM | POA: Diagnosis not present

## 2013-09-20 DIAGNOSIS — IMO0001 Reserved for inherently not codable concepts without codable children: Secondary | ICD-10-CM | POA: Diagnosis not present

## 2013-09-20 DIAGNOSIS — M169 Osteoarthritis of hip, unspecified: Secondary | ICD-10-CM | POA: Diagnosis not present

## 2013-09-20 DIAGNOSIS — IMO0002 Reserved for concepts with insufficient information to code with codable children: Secondary | ICD-10-CM | POA: Diagnosis not present

## 2013-09-24 DIAGNOSIS — Z1231 Encounter for screening mammogram for malignant neoplasm of breast: Secondary | ICD-10-CM | POA: Diagnosis not present

## 2013-10-04 DIAGNOSIS — I10 Essential (primary) hypertension: Secondary | ICD-10-CM | POA: Diagnosis not present

## 2013-10-04 DIAGNOSIS — IMO0001 Reserved for inherently not codable concepts without codable children: Secondary | ICD-10-CM | POA: Diagnosis not present

## 2013-10-04 DIAGNOSIS — I2510 Atherosclerotic heart disease of native coronary artery without angina pectoris: Secondary | ICD-10-CM | POA: Diagnosis not present

## 2013-10-04 DIAGNOSIS — E119 Type 2 diabetes mellitus without complications: Secondary | ICD-10-CM | POA: Diagnosis not present

## 2013-10-04 DIAGNOSIS — F411 Generalized anxiety disorder: Secondary | ICD-10-CM | POA: Diagnosis not present

## 2013-10-09 ENCOUNTER — Other Ambulatory Visit: Payer: Self-pay | Admitting: Cardiology

## 2013-10-09 ENCOUNTER — Ambulatory Visit (INDEPENDENT_AMBULATORY_CARE_PROVIDER_SITE_OTHER): Payer: Medicare Other | Admitting: Cardiology

## 2013-10-09 ENCOUNTER — Encounter: Payer: Self-pay | Admitting: Cardiology

## 2013-10-09 VITALS — BP 132/78 | HR 72 | Ht 64.5 in | Wt 176.0 lb

## 2013-10-09 DIAGNOSIS — I25118 Atherosclerotic heart disease of native coronary artery with other forms of angina pectoris: Secondary | ICD-10-CM

## 2013-10-09 DIAGNOSIS — R0989 Other specified symptoms and signs involving the circulatory and respiratory systems: Secondary | ICD-10-CM

## 2013-10-09 DIAGNOSIS — I209 Angina pectoris, unspecified: Secondary | ICD-10-CM

## 2013-10-09 DIAGNOSIS — I2510 Atherosclerotic heart disease of native coronary artery without angina pectoris: Secondary | ICD-10-CM | POA: Diagnosis not present

## 2013-10-09 DIAGNOSIS — I255 Ischemic cardiomyopathy: Secondary | ICD-10-CM

## 2013-10-09 DIAGNOSIS — R0609 Other forms of dyspnea: Secondary | ICD-10-CM | POA: Insufficient documentation

## 2013-10-09 DIAGNOSIS — I2589 Other forms of chronic ischemic heart disease: Secondary | ICD-10-CM | POA: Diagnosis not present

## 2013-10-09 DIAGNOSIS — I10 Essential (primary) hypertension: Secondary | ICD-10-CM

## 2013-10-09 DIAGNOSIS — E1159 Type 2 diabetes mellitus with other circulatory complications: Secondary | ICD-10-CM

## 2013-10-09 LAB — CBC WITH DIFFERENTIAL/PLATELET
Basophils Absolute: 0 10*3/uL (ref 0.0–0.1)
Basophils Relative: 0 % (ref 0–1)
Eosinophils Absolute: 0.2 10*3/uL (ref 0.0–0.7)
Eosinophils Relative: 2 % (ref 0–5)
HEMATOCRIT: 42.3 % (ref 36.0–46.0)
Hemoglobin: 14.8 g/dL (ref 12.0–15.0)
LYMPHS ABS: 1.4 10*3/uL (ref 0.7–4.0)
LYMPHS PCT: 18 % (ref 12–46)
MCH: 33.3 pg (ref 26.0–34.0)
MCHC: 35 g/dL (ref 30.0–36.0)
MCV: 95.3 fL (ref 78.0–100.0)
Monocytes Absolute: 0.7 10*3/uL (ref 0.1–1.0)
Monocytes Relative: 9 % (ref 3–12)
Neutro Abs: 5.5 10*3/uL (ref 1.7–7.7)
Neutrophils Relative %: 71 % (ref 43–77)
PLATELETS: 218 10*3/uL (ref 150–400)
RBC: 4.44 MIL/uL (ref 3.87–5.11)
RDW: 14.3 % (ref 11.5–15.5)
WBC: 7.8 10*3/uL (ref 4.0–10.5)

## 2013-10-09 MED ORDER — CLOPIDOGREL BISULFATE 75 MG PO TABS: ORAL_TABLET | ORAL | Status: DC

## 2013-10-09 NOTE — Progress Notes (Signed)
Karen [name] Date of Birth: August 03, 1928 Medical Record [id number]  History of Present Illness: Karen [name] is seen at the request of Dr. Sallyanne Kuster for consideration of coronary intervention. She is a very pleasant [name] with history of CAD, diastolic dysfunction, HTN, and arthritis. Over the past year she has noted a significant decline is her functional status. She complains of DOE and she becomes so weak in her hips and legs that she finds it difficult to ambulate. Her legs become very fatigued. She gets SOB doing simple household activities. She had a nuclear stress test in February that showed EF 45% without clear perfusion abnormality. She had cardiac cath in March. This demonstrated 3 vessel CAD with 90% stenosis in the mid to distal LAD, 70% stenosis in the mid LCx, 90% stenosis in a small OM3 and 80% stenosis in a small PLOM. She has been treated with aggressive medical therapy over the past 5 months without significant improvement. This includes holding statin therapy with no improvement in leg fatigue. She was diuresed for CHF. Her antianginal therapy was increased. Given the lack of response there is concern that some of her symptoms may be anginal equivalent and consideration is given for PCI. Due to the distal nature of her CAD as well as age and co-morbidities she is not felt to be a good candidate for CABG.    Medication List       This list is accurate as of: 10/09/13  8:50 PM.  Always use your most recent med list.               amLODipine 10 MG tablet  Commonly known as:  NORVASC  Take 1 tablet (10 mg total) by mouth daily.     aspirin 81 MG tablet  Take 1 tablet (81 mg total) by mouth daily.     CALCIUM 600+D 600-400 MG-UNIT per tablet  Generic drug:  Calcium Carbonate-Vitamin D  Take 1 tablet by mouth daily.     clopidogrel 75 MG tablet  Commonly known as:  PLAVIX  Take 300 mg today ( 4 tablets ) then 75 mg daily     docusate sodium 100 MG capsule    Commonly known as:  COLACE  Take 100 mg by mouth daily.     furosemide 40 MG tablet  Commonly known as:  LASIX  Take 2 tablets (80 mg total) by mouth daily.     magnesium oxide 400 MG tablet  Commonly known as:  MAG-OX  Take 400 mg by mouth daily.     meloxicam 7.5 MG tablet  Commonly known as:  MOBIC  Take 7.5 mg by mouth 2 (two) times daily.     metFORMIN 1000 MG tablet  Commonly known as:  GLUCOPHAGE  Take 1 tablet (1,000 mg total) by mouth 2 (two) times daily with a meal.     metoprolol succinate 50 MG 24 hr tablet  Commonly known as:  TOPROL-XL  Take 50 mg by mouth daily. Take with or immediately following a meal.     mirtazapine 15 MG tablet  Commonly known as:  REMERON  Take 15 mg by mouth at bedtime.     multivitamin tablet  Take 1 tablet by mouth daily.     potassium chloride SA 20 MEQ tablet  Commonly known as:  K-DUR,KLOR-CON  Take 20 mEq by mouth daily.     QUEtiapine 200 MG tablet  Commonly known as:  SEROQUEL  Take 200 mg by mouth  at bedtime.     simvastatin 20 MG tablet  Commonly known as:  ZOCOR  Take 1 tablet (20 mg total) by mouth at bedtime.     sitaGLIPtin 100 MG tablet  Commonly known as:  JANUVIA  Take 100 mg by mouth daily.     SYSTANE OP  Place 1 drop into both eyes daily as needed (dry eyes).     valsartan 320 MG tablet  Commonly known as:  DIOVAN  Take 1 tablet (320 mg total) by mouth daily.         Allergies  Allergen Reactions  . Codeine Other (See Comments)    Unknown   . Lisinopril Cough  . Morphine And Related Other (See Comments)    Unknown   . Pneumococcal Vaccines Swelling and Other (See Comments)    Very bad pain in the arm of the shot  . Ultram [Tramadol Hcl] Itching    Past Medical History  Diagnosis Date  . Diabetes mellitus   . Hypertension   . H/O: gout   . Fibromyalgia   . Constipation   . Hx: UTI (urinary tract infection)   . Arthritis     Past Surgical History  Procedure Laterality Date  .  Knee surgery  1997/2000  . Replacement total hip w/  resurfacing implants  [id number]  . Spinal fusion  2006  . Joint replacement  left hip-2007, left knee-2000, right knee-1997    Left Hip, Bilateral Knee replacements  . Back surgery  1972  . Closed reduction of left total hip  2008    x 3  . Right foot surgery  2004    x 2  . Appendectomy  1951  . Cholecystectomy  1951  . Hernia repair  1963  . Salivary gland stone extraction    . Cardiac catheterization  2015    History   Social History  . Marital Status: Widowed    Spouse Name: N/A    Number of Children: N/A  . Years of Education: N/A   Social History Main Topics  . Smoking status: Never Smoker   . Smokeless tobacco: Never Used  . Alcohol Use: No  . Drug Use: No  . Sexual Activity: No   Other Topics Concern  . None   Social History Narrative  . None    Family History  Problem Relation Age of Onset  . Heart attack Father   . Stroke Father   . Cancer Mother   . Heart attack Brother   . Cancer Brother     Review of Systems: As note in HPI. No history of bleeding or CVA. She does have severe arthritis.  All other systems were reviewed and are negative.  Physical Exam: BP 132/78  Pulse 72  Ht 5' 4.5" (1.638 m)  Wt 176 lb (79.833 kg)  BMI 29.75 kg/m2 Filed Weights   10/09/13 1204  Weight: 176 lb (79.833 kg)  GENERAL:  Well appearing HEENT:  PERRL, EOMI, sclera are clear. Oropharynx is clear. NECK:  No jugular venous distention, carotid upstroke brisk and symmetric, no bruits, no thyromegaly or adenopathy LUNGS:  Clear to auscultation bilaterally CHEST:  Unremarkable HEART:  RRR,  PMI not displaced or sustained,S1 and S2 within normal limits, no S3, no S4: no clicks, no rubs, no murmurs ABD:  Soft, nontender. BS +, no masses or bruits. No hepatomegaly, no splenomegaly EXT:  2 + pulses throughout, no edema, no cyanosis no clubbing. Arthritic changes in hands and feet. SKIN:  Warm and dry.  No  rashes NEURO:  Alert and oriented x 3. Cranial nerves II through XII intact. PSYCH:  Cognitively intact    LABORATORY DATA: Lab Results  Component Value Date   WBC 7.8 10/09/2013   HGB 14.8 10/09/2013   HCT 42.3 10/09/2013   PLT 218 10/09/2013   GLUCOSE 137* 07/05/2013   ALT 18 07/05/2013   AST 20 07/05/2013   NA 140 07/05/2013   K 3.5 07/05/2013   CL 98 07/05/2013   CREATININE 0.85 07/05/2013   BUN 23 07/05/2013   CO2 28 07/05/2013   TSH 7.030* 05/16/2012   INR 1.03 04/19/2013   Ecg: NSR with LVH by voltage.  Assessment / Plan: 1. Dyspnea on exertion. Probable anginal equivalent. Patient remains symptomatic class 3-4 despite medical therapy and attempts to treat other confounding problems  2. CAD. I have reviewed cath data personally with the patient and son today. I think the mid to distal LAD and mid LCx lesions are reasonable targets for PCI. The LCx lesion is focal in a large vessel and looks fairly straightforward. The LAD lesion is more complex since the lesion is a segment with small diameter and the entire LAD is diffusely diseased. I think the focal segment of disease is treatable and would probably accommodate a 2.25 mm stent. The disease in the OM3 and PLOM is not favorable for PCI due to small vessel size. We would also need to use a femoral approach given significant tortuosity noted on diagnostic cath in the innominate artery. I have fully discussed risks and potential benefit with the patient. She is agreeable to proceed. Will initiate dual antiplatelet therapy with ASA 81 mg daily and Plavix. Will schedule procedure for this Thursday. The procedure and risks were reviewed including but not limited to death, myocardial infarction, stroke, arrythmias, bleeding, transfusion, emergency surgery, dye allergy, or renal dysfunction. The patient voices understanding and is agreeable to proceed.  3. HTN  4. Ischemic caridomyopathy   5. Chronic combined systolic/diatolic CHF  6. DM- will hold  metformin for 48 hours post PCI

## 2013-10-09 NOTE — Patient Instructions (Signed)
We will schedule you for coronary intervention.

## 2013-10-10 LAB — BASIC METABOLIC PANEL
BUN: 21 mg/dL (ref 6–23)
CHLORIDE: 100 meq/L (ref 96–112)
CO2: 27 mEq/L (ref 19–32)
Calcium: 10.7 mg/dL — ABNORMAL HIGH (ref 8.4–10.5)
Creat: 1.05 mg/dL (ref 0.50–1.10)
Glucose, Bld: 122 mg/dL — ABNORMAL HIGH (ref 70–99)
POTASSIUM: 3.9 meq/L (ref 3.5–5.3)
SODIUM: 140 meq/L (ref 135–145)

## 2013-10-10 LAB — PROTIME-INR
INR: 0.99 (ref <1.50)
Prothrombin Time: 13.1 seconds (ref 11.6–15.2)

## 2013-10-11 ENCOUNTER — Ambulatory Visit (HOSPITAL_COMMUNITY)
Admission: RE | Admit: 2013-10-11 | Discharge: 2013-10-12 | Disposition: A | Discharge status: Home / Self Care | Payer: Medicare Other | Source: Ambulatory Visit | Attending: Cardiology | Admitting: Cardiology

## 2013-10-11 ENCOUNTER — Encounter (HOSPITAL_COMMUNITY)
Admission: RE | Disposition: A | Discharge status: Home / Self Care | Payer: Self-pay | Source: Ambulatory Visit | Attending: Cardiology

## 2013-10-11 DIAGNOSIS — R5383 Other fatigue: Secondary | ICD-10-CM

## 2013-10-11 DIAGNOSIS — R5381 Other malaise: Secondary | ICD-10-CM | POA: Diagnosis not present

## 2013-10-11 DIAGNOSIS — I209 Angina pectoris, unspecified: Secondary | ICD-10-CM | POA: Diagnosis not present

## 2013-10-11 DIAGNOSIS — I2510 Atherosclerotic heart disease of native coronary artery without angina pectoris: Secondary | ICD-10-CM | POA: Diagnosis not present

## 2013-10-11 DIAGNOSIS — Z96659 Presence of unspecified artificial knee joint: Secondary | ICD-10-CM | POA: Diagnosis not present

## 2013-10-11 DIAGNOSIS — R0989 Other specified symptoms and signs involving the circulatory and respiratory systems: Secondary | ICD-10-CM | POA: Insufficient documentation

## 2013-10-11 DIAGNOSIS — I2589 Other forms of chronic ischemic heart disease: Secondary | ICD-10-CM | POA: Insufficient documentation

## 2013-10-11 DIAGNOSIS — Z96649 Presence of unspecified artificial hip joint: Secondary | ICD-10-CM | POA: Insufficient documentation

## 2013-10-11 DIAGNOSIS — I509 Heart failure, unspecified: Secondary | ICD-10-CM | POA: Insufficient documentation

## 2013-10-11 DIAGNOSIS — IMO0001 Reserved for inherently not codable concepts without codable children: Secondary | ICD-10-CM | POA: Diagnosis not present

## 2013-10-11 DIAGNOSIS — E119 Type 2 diabetes mellitus without complications: Secondary | ICD-10-CM | POA: Diagnosis not present

## 2013-10-11 DIAGNOSIS — I5042 Chronic combined systolic (congestive) and diastolic (congestive) heart failure: Secondary | ICD-10-CM | POA: Diagnosis not present

## 2013-10-11 DIAGNOSIS — E1159 Type 2 diabetes mellitus with other circulatory complications: Secondary | ICD-10-CM

## 2013-10-11 DIAGNOSIS — M129 Arthropathy, unspecified: Secondary | ICD-10-CM | POA: Insufficient documentation

## 2013-10-11 DIAGNOSIS — R0609 Other forms of dyspnea: Secondary | ICD-10-CM | POA: Diagnosis not present

## 2013-10-11 DIAGNOSIS — I10 Essential (primary) hypertension: Secondary | ICD-10-CM

## 2013-10-11 DIAGNOSIS — Z955 Presence of coronary angioplasty implant and graft: Secondary | ICD-10-CM

## 2013-10-11 DIAGNOSIS — I25118 Atherosclerotic heart disease of native coronary artery with other forms of angina pectoris: Secondary | ICD-10-CM

## 2013-10-11 DIAGNOSIS — E876 Hypokalemia: Secondary | ICD-10-CM | POA: Diagnosis not present

## 2013-10-11 DIAGNOSIS — E785 Hyperlipidemia, unspecified: Secondary | ICD-10-CM

## 2013-10-11 HISTORY — PX: PERCUTANEOUS CORONARY STENT INTERVENTION (PCI-S): SHX5485

## 2013-10-11 HISTORY — PX: CORONARY STENT PLACEMENT: SHX1402

## 2013-10-11 HISTORY — DX: Atherosclerotic heart disease of native coronary artery without angina pectoris: I25.10

## 2013-10-11 LAB — GLUCOSE, CAPILLARY
GLUCOSE-CAPILLARY: 170 mg/dL — AB (ref 70–99)
GLUCOSE-CAPILLARY: 173 mg/dL — AB (ref 70–99)
Glucose-Capillary: 248 mg/dL — ABNORMAL HIGH (ref 70–99)

## 2013-10-11 LAB — POCT ACTIVATED CLOTTING TIME: Activated Clotting Time: 619 seconds

## 2013-10-11 SURGERY — PERCUTANEOUS CORONARY STENT INTERVENTION (PCI-S)
Anesthesia: LOCAL

## 2013-10-11 MED ORDER — HYDRALAZINE HCL 20 MG/ML IJ SOLN
INTRAMUSCULAR | Status: AC
  Filled 2013-10-11: qty 1

## 2013-10-11 MED ORDER — ADULT MULTIVITAMIN W/MINERALS CH
1.0000 | ORAL_TABLET | Freq: Every day | ORAL | Status: DC
  Administered 2013-10-12: 1 via ORAL
  Filled 2013-10-11: qty 1

## 2013-10-11 MED ORDER — QUETIAPINE FUMARATE 200 MG PO TABS
200.0000 mg | ORAL_TABLET | Freq: Every day | ORAL | Status: DC
  Administered 2013-10-11: 20:00:00 200 mg via ORAL
  Filled 2013-10-11 (×2): qty 1

## 2013-10-11 MED ORDER — MAGNESIUM OXIDE 400 (241.3 MG) MG PO TABS
400.0000 mg | ORAL_TABLET | Freq: Every day | ORAL | Status: DC
  Administered 2013-10-12: 09:00:00 400 mg via ORAL
  Filled 2013-10-11: qty 1

## 2013-10-11 MED ORDER — CALCIUM CARBONATE-VITAMIN D 600-400 MG-UNIT PO TABS: 1.0000 | ORAL_TABLET | Freq: Every day | ORAL | Status: DC

## 2013-10-11 MED ORDER — CLOPIDOGREL BISULFATE 75 MG PO TABS
75.0000 mg | ORAL_TABLET | Freq: Every day | ORAL | Status: DC
  Administered 2013-10-12: 09:00:00 75 mg via ORAL
  Filled 2013-10-11: qty 1

## 2013-10-11 MED ORDER — SODIUM CHLORIDE 0.9 % IV SOLN
INTRAVENOUS | Status: DC
  Administered 2013-10-11: 09:00:00 via INTRAVENOUS

## 2013-10-11 MED ORDER — MAGNESIUM OXIDE 400 MG PO TABS: 400.0000 mg | ORAL_TABLET | Freq: Every day | ORAL | Status: DC

## 2013-10-11 MED ORDER — SODIUM CHLORIDE 0.9 % IV SOLN
1.0000 mL/kg/h | INTRAVENOUS | Status: AC
  Administered 2013-10-11: 16:00:00 1 mL/kg/h via INTRAVENOUS

## 2013-10-11 MED ORDER — MIRTAZAPINE 15 MG PO TABS
15.0000 mg | ORAL_TABLET | Freq: Every day | ORAL | Status: DC
  Administered 2013-10-11: 20:00:00 15 mg via ORAL
  Filled 2013-10-11 (×2): qty 1

## 2013-10-11 MED ORDER — ASPIRIN 81 MG PO CHEW: 81.0000 mg | CHEWABLE_TABLET | ORAL | Status: DC

## 2013-10-11 MED ORDER — ACETAMINOPHEN 325 MG PO TABS: 650.0000 mg | ORAL_TABLET | ORAL | Status: DC | PRN

## 2013-10-11 MED ORDER — NITROGLYCERIN 1 MG/10 ML FOR IR/CATH LAB
INTRA_ARTERIAL | Status: AC
  Filled 2013-10-11: qty 10

## 2013-10-11 MED ORDER — SIMVASTATIN 20 MG PO TABS
20.0000 mg | ORAL_TABLET | Freq: Every day | ORAL | Status: DC
Start: 2013-10-11 — End: 2013-10-12
  Administered 2013-10-11: 20 mg via ORAL
  Filled 2013-10-11 (×2): qty 1

## 2013-10-11 MED ORDER — ASPIRIN EC 81 MG PO TBEC
81.0000 mg | DELAYED_RELEASE_TABLET | Freq: Every day | ORAL | Status: DC
  Administered 2013-10-12: 81 mg via ORAL
  Filled 2013-10-11: qty 1

## 2013-10-11 MED ORDER — SODIUM CHLORIDE 0.9 % IV SOLN: 250.0000 mL | INTRAVENOUS | Status: DC | PRN

## 2013-10-11 MED ORDER — CALCIUM CARBONATE-VITAMIN D 500-200 MG-UNIT PO TABS
1.0000 | ORAL_TABLET | Freq: Every day | ORAL | Status: DC
  Administered 2013-10-12: 1 via ORAL
  Filled 2013-10-11 (×2): qty 1

## 2013-10-11 MED ORDER — FUROSEMIDE 80 MG PO TABS
80.0000 mg | ORAL_TABLET | Freq: Every day | ORAL | Status: DC
Start: 2013-10-11 — End: 2013-10-12
  Administered 2013-10-12: 80 mg via ORAL
  Filled 2013-10-11 (×2): qty 1

## 2013-10-11 MED ORDER — LIDOCAINE HCL (PF) 1 % IJ SOLN
INTRAMUSCULAR | Status: AC
  Filled 2013-10-11: qty 30

## 2013-10-11 MED ORDER — CLOPIDOGREL BISULFATE 75 MG PO TABS: 75.0000 mg | ORAL_TABLET | Freq: Every day | ORAL | Status: DC

## 2013-10-11 MED ORDER — ONDANSETRON HCL 4 MG/2ML IJ SOLN: 4.0000 mg | Freq: Four times a day (QID) | INTRAMUSCULAR | Status: DC | PRN

## 2013-10-11 MED ORDER — ASPIRIN 81 MG PO TABS: 81.0000 mg | ORAL_TABLET | Freq: Every day | ORAL | Status: DC

## 2013-10-11 MED ORDER — METOPROLOL SUCCINATE ER 50 MG PO TB24
50.0000 mg | ORAL_TABLET | Freq: Every day | ORAL | Status: DC
  Administered 2013-10-12: 50 mg via ORAL
  Filled 2013-10-11: qty 1

## 2013-10-11 MED ORDER — ONE-DAILY MULTI VITAMINS PO TABS: 1.0000 | ORAL_TABLET | Freq: Every day | ORAL | Status: DC

## 2013-10-11 MED ORDER — BIVALIRUDIN 250 MG IV SOLR
INTRAVENOUS | Status: AC
  Filled 2013-10-11: qty 250

## 2013-10-11 MED ORDER — SODIUM CHLORIDE 0.9 % IJ SOLN: 3.0000 mL | INTRAMUSCULAR | Status: DC | PRN

## 2013-10-11 MED ORDER — MIDAZOLAM HCL 2 MG/2ML IJ SOLN
INTRAMUSCULAR | Status: AC
  Filled 2013-10-11: qty 2

## 2013-10-11 MED ORDER — SODIUM CHLORIDE 0.9 % IJ SOLN: 3.0000 mL | Freq: Two times a day (BID) | INTRAMUSCULAR | Status: DC

## 2013-10-11 MED ORDER — HEPARIN (PORCINE) IN NACL 2-0.9 UNIT/ML-% IJ SOLN
INTRAMUSCULAR | Status: AC
  Filled 2013-10-11: qty 1500

## 2013-10-11 MED ORDER — DOCUSATE SODIUM 100 MG PO CAPS
100.0000 mg | ORAL_CAPSULE | Freq: Every day | ORAL | Status: DC
  Administered 2013-10-12: 09:00:00 100 mg via ORAL
  Filled 2013-10-11: qty 1

## 2013-10-11 MED ORDER — AMLODIPINE BESYLATE 10 MG PO TABS
10.0000 mg | ORAL_TABLET | Freq: Every day | ORAL | Status: DC
  Administered 2013-10-12: 10 mg via ORAL
  Filled 2013-10-11: qty 1

## 2013-10-11 MED ORDER — FENTANYL CITRATE 0.05 MG/ML IJ SOLN
INTRAMUSCULAR | Status: AC
  Filled 2013-10-11: qty 2

## 2013-10-11 MED ORDER — IRBESARTAN 300 MG PO TABS
300.0000 mg | ORAL_TABLET | Freq: Every day | ORAL | Status: DC
  Administered 2013-10-12: 09:00:00 300 mg via ORAL
  Filled 2013-10-11 (×2): qty 1

## 2013-10-11 MED ORDER — POTASSIUM CHLORIDE CRYS ER 20 MEQ PO TBCR
20.0000 meq | EXTENDED_RELEASE_TABLET | Freq: Every day | ORAL | Status: DC
  Administered 2013-10-12: 09:00:00 20 meq via ORAL
  Filled 2013-10-11: qty 1

## 2013-10-11 MED ORDER — LINAGLIPTIN 5 MG PO TABS
5.0000 mg | ORAL_TABLET | Freq: Every day | ORAL | Status: DC
  Administered 2013-10-11 – 2013-10-12 (×2): 5 mg via ORAL
  Filled 2013-10-11 (×2): qty 1

## 2013-10-11 NOTE — CV Procedure (Signed)
    CARDIAC CATH NOTE  Name: Karen [name] MRN: [id number] DOB: 07/13/1928  Procedure: PTCA and stenting of the mid to distal LAD, mid LCx  Indication: [name] with refractory dyspnea and fatigue despite optimal medical therapy. Diagnostic cath showed severe disease in the mid to distal LAD 90% and 80% stenosis in the mid LCx.  Procedural Details: The right groin was prepped, draped, and anesthetized with 1% lidocaine. Using the modified Seldinger technique, a 6 Fr sheath was introduced into the right femoral artery.  Weight-based bivalirudin was given for anticoagulation. Once a therapeutic ACT was achieved, a 6 [place] XBLAD 3.5  guide catheter was inserted.  A prowater coronary guidewire was used to cross the lesion in the LAD.  The lesion was predilated with a 2.0 mm balloon.  The lesion was then stented with a 2.25 x 20 mm Promus stent.  The stent was postdilated with a 2.25 mm noncompliant balloon.  Following PCI, there was 0% residual stenosis and TIMI-3 flow.  Next the lesion in the LCx was crossed with the prowater wire. The lesion was predilated with a 2.0 mm balloon. The lesion was then stented with a 3.5 x 12 mm Promus stent. The stent was postdilated with a  3.5 mm noncompliant balloon. Following PCI there was 0% residual stenosis and TIMI 3 flow. Final angiography confirmed an excellent result. The patient tolerated the procedure well. There were no immediate procedural complications. Femoral hemostasis was achieved with an Angioseal device. The patient was transferred to the post catheterization recovery area for further monitoring.  Lesion Data: Vessel: LAD- mid to distal Percent stenosis (pre): 90% TIMI-flow (pre):  3 Stent:  2.25 x 20 mm Promus Percent stenosis (post): 0% TIMI-flow (post): 3  Vessel #2 LCx-mid Percent stenosis pre- 80% TIMI flow Pre- 3 Stent: 3.5 x 12 mm Promus Percent stenosis-post- 0% TIMI flow post- 3  Conclusions:  1. Successful stenting of the  mid to distal LAD with DES. 2. Successful stenting of the mid LCx with DES  Recommendations: Continue DAPT for one year. Anticipate DC in am.  Peter Martinique, [place]  10/11/2013, 1:00 PM

## 2013-10-11 NOTE — Interval H&P Note (Signed)
History and Physical Interval Note:  10/11/2013 11:56 AM  [place] Noe Scannell  has presented today for surgery, with the diagnosis of cad  The various methods of treatment have been discussed with the patient and family. After consideration of risks, benefits and other options for treatment, the patient has consented to  Procedure(s): PERCUTANEOUS CORONARY STENT INTERVENTION (PCI-S) (N/A) as a surgical intervention .  The patient's history has been reviewed, patient examined, no change in status, stable for surgery.  I have reviewed the patient's chart and labs.  Questions were answered to the patient's satisfaction.   Cath Lab Visit (complete for each Cath Lab visit)  Clinical Evaluation Leading to the Procedure:   ACS: No.  Non-ACS:    Anginal Classification: CCS III  Anti-ischemic medical therapy: Maximal Therapy (2 or more classes of medications)  Non-Invasive Test Results: Intermediate-risk stress test findings: cardiac mortality 1-3%/year  Prior CABG: No previous CABG        Collier Salina [facility] 10/11/2013 11:56 AM

## 2013-10-11 NOTE — Progress Notes (Signed)
Patient arrived to [facility] with a femstop applied to her right groin due to possible closure device failure. Femstop manometer is set to 85 mmHg. Right pedal pulse palpable. Awaiting Dr Doug Sou order regarding femstop before transport to 6C05.

## 2013-10-11 NOTE — H&P (View-Only) (Signed)
[place] Date of Birth: 11-27-1928 Medical Record [id number]  History of Present Illness: Karen [name] is seen at the request of Dr. Sallyanne Kuster for consideration of coronary intervention. She is a very pleasant [name] with history of CAD, diastolic dysfunction, HTN, and arthritis. Over the past year she has noted a significant decline is her functional status. She complains of DOE and she becomes so weak in her hips and legs that she finds it difficult to ambulate. Her legs become very fatigued. She gets SOB doing simple household activities. She had a nuclear stress test in February that showed EF 45% without clear perfusion abnormality. She had cardiac cath in March. This demonstrated 3 vessel CAD with 90% stenosis in the mid to distal LAD, 70% stenosis in the mid LCx, 90% stenosis in a small OM3 and 80% stenosis in a small PLOM. She has been treated with aggressive medical therapy over the past 5 months without significant improvement. This includes holding statin therapy with no improvement in leg fatigue. She was diuresed for CHF. Her antianginal therapy was increased. Given the lack of response there is concern that some of her symptoms may be anginal equivalent and consideration is given for PCI. Due to the distal nature of her CAD as well as age and co-morbidities she is not felt to be a good candidate for CABG.    Medication List       This list is accurate as of: 10/09/13  8:50 PM.  Always use your most recent med list.               amLODipine 10 MG tablet  Commonly known as:  NORVASC  Take 1 tablet (10 mg total) by mouth daily.     aspirin 81 MG tablet  Take 1 tablet (81 mg total) by mouth daily.     CALCIUM 600+D 600-400 MG-UNIT per tablet  Generic drug:  Calcium Carbonate-Vitamin D  Take 1 tablet by mouth daily.     clopidogrel 75 MG tablet  Commonly known as:  PLAVIX  Take 300 mg today ( 4 tablets ) then 75 mg daily     docusate sodium 100 MG capsule    Commonly known as:  COLACE  Take 100 mg by mouth daily.     furosemide 40 MG tablet  Commonly known as:  LASIX  Take 2 tablets (80 mg total) by mouth daily.     magnesium oxide 400 MG tablet  Commonly known as:  MAG-OX  Take 400 mg by mouth daily.     meloxicam 7.5 MG tablet  Commonly known as:  MOBIC  Take 7.5 mg by mouth 2 (two) times daily.     metFORMIN 1000 MG tablet  Commonly known as:  GLUCOPHAGE  Take 1 tablet (1,000 mg total) by mouth 2 (two) times daily with a meal.     metoprolol succinate 50 MG 24 hr tablet  Commonly known as:  TOPROL-XL  Take 50 mg by mouth daily. Take with or immediately following a meal.     mirtazapine 15 MG tablet  Commonly known as:  REMERON  Take 15 mg by mouth at bedtime.     multivitamin tablet  Take 1 tablet by mouth daily.     potassium chloride SA 20 MEQ tablet  Commonly known as:  K-DUR,KLOR-CON  Take 20 mEq by mouth daily.     QUEtiapine 200 MG tablet  Commonly known as:  SEROQUEL  Take 200 mg by mouth  at bedtime.     simvastatin 20 MG tablet  Commonly known as:  ZOCOR  Take 1 tablet (20 mg total) by mouth at bedtime.     sitaGLIPtin 100 MG tablet  Commonly known as:  JANUVIA  Take 100 mg by mouth daily.     SYSTANE OP  Place 1 drop into both eyes daily as needed (dry eyes).     valsartan 320 MG tablet  Commonly known as:  DIOVAN  Take 1 tablet (320 mg total) by mouth daily.         Allergies  Allergen Reactions  . Codeine Other (See Comments)    Unknown   . Lisinopril Cough  . Morphine And Related Other (See Comments)    Unknown   . Pneumococcal Vaccines Swelling and Other (See Comments)    Very bad pain in the arm of the shot  . Ultram [Tramadol Hcl] Itching    Past Medical History  Diagnosis Date  . Diabetes mellitus   . Hypertension   . H/O: gout   . Fibromyalgia   . Constipation   . Hx: UTI (urinary tract infection)   . Arthritis     Past Surgical History  Procedure Laterality Date  .  Knee surgery  1997/2000  . Replacement total hip w/  resurfacing implants  [id number]  . Spinal fusion  2006  . Joint replacement  left hip-2007, left knee-2000, right knee-1997    Left Hip, Bilateral Knee replacements  . Back surgery  1972  . Closed reduction of left total hip  2008    x 3  . Right foot surgery  2004    x 2  . Appendectomy  1951  . Cholecystectomy  1951  . Hernia repair  1963  . Salivary gland stone extraction    . Cardiac catheterization  2015    History   Social History  . Marital Status: Widowed    Spouse Name: N/A    Number of Children: N/A  . Years of Education: N/A   Social History Main Topics  . Smoking status: Never Smoker   . Smokeless tobacco: Never Used  . Alcohol Use: No  . Drug Use: No  . Sexual Activity: No   Other Topics Concern  . None   Social History Narrative  . None    Family History  Problem Relation Age of Onset  . Heart attack Father   . Stroke Father   . Cancer Mother   . Heart attack Brother   . Cancer Brother     Review of Systems: As note in HPI. No history of bleeding or CVA. She does have severe arthritis.  All other systems were reviewed and are negative.  Physical Exam: BP 132/78  Pulse 72  Ht 5' 4.5" (1.638 m)  Wt 176 lb (79.833 kg)  BMI 29.75 kg/m2 Filed Weights   10/09/13 1204  Weight: 176 lb (79.833 kg)  GENERAL:  Well appearing HEENT:  PERRL, EOMI, sclera are clear. Oropharynx is clear. NECK:  No jugular venous distention, carotid upstroke brisk and symmetric, no bruits, no thyromegaly or adenopathy LUNGS:  Clear to auscultation bilaterally CHEST:  Unremarkable HEART:  RRR,  PMI not displaced or sustained,S1 and S2 within normal limits, no S3, no S4: no clicks, no rubs, no murmurs ABD:  Soft, nontender. BS +, no masses or bruits. No hepatomegaly, no splenomegaly EXT:  2 + pulses throughout, no edema, no cyanosis no clubbing. Arthritic changes in hands and feet. SKIN:  Warm and dry.  No  rashes NEURO:  Alert and oriented x 3. Cranial nerves II through XII intact. PSYCH:  Cognitively intact    LABORATORY DATA: Lab Results  Component Value Date   WBC 7.8 10/09/2013   HGB 14.8 10/09/2013   HCT 42.3 10/09/2013   PLT 218 10/09/2013   GLUCOSE 137* 07/05/2013   ALT 18 07/05/2013   AST 20 07/05/2013   NA 140 07/05/2013   K 3.5 07/05/2013   CL 98 07/05/2013   CREATININE 0.85 07/05/2013   BUN 23 07/05/2013   CO2 28 07/05/2013   TSH 7.030* 05/16/2012   INR 1.03 04/19/2013   Ecg: NSR with LVH by voltage.  Assessment / Plan: 1. Dyspnea on exertion. Probable anginal equivalent. Patient remains symptomatic class 3-4 despite medical therapy and attempts to treat other confounding problems  2. CAD. I have reviewed cath data personally with the patient and son today. I think the mid to distal LAD and mid LCx lesions are reasonable targets for PCI. The LCx lesion is focal in a large vessel and looks fairly straightforward. The LAD lesion is more complex since the lesion is a segment with small diameter and the entire LAD is diffusely diseased. I think the focal segment of disease is treatable and would probably accommodate a 2.25 mm stent. The disease in the OM3 and PLOM is not favorable for PCI due to small vessel size. We would also need to use a femoral approach given significant tortuosity noted on diagnostic cath in the innominate artery. I have fully discussed risks and potential benefit with the patient. She is agreeable to proceed. Will initiate dual antiplatelet therapy with ASA 81 mg daily and Plavix. Will schedule procedure for this Thursday. The procedure and risks were reviewed including but not limited to death, myocardial infarction, stroke, arrythmias, bleeding, transfusion, emergency surgery, dye allergy, or renal dysfunction. The patient voices understanding and is agreeable to proceed.  3. HTN  4. Ischemic caridomyopathy   5. Chronic combined systolic/diatolic CHF  6. DM- will hold  metformin for 48 hours post PCI

## 2013-10-11 NOTE — Care Management Note (Addendum)
  Page 1 of 1   10/11/2013     4:31:53 PM CARE MANAGEMENT NOTE 10/11/2013  Patient:  Karen [name], Karen [name]   Account Number:  [id number]  Date Initiated:  10/11/2013  Documentation initiated by:  Mariann Laster  Subjective/Objective Assessment:   angina pectoris     Action/Plan:   CM to follow for disposition needs   Anticipated DC Date:  10/12/2013   Anticipated DC Plan:  HOME/SELF CARE         Choice offered to / List presented to:             Status of service:  Completed, signed off Medicare Important Message given?   (If response is "NO", the following Medicare IM given date fields will be blank) Date Medicare IM given:   Medicare IM given by:   Date Additional Medicare IM given:   Additional Medicare IM given by:    Discharge Disposition:  HOME/SELF CARE  Per UR Regulation:  Reviewed for med. necessity/level of care/duration of stay  If discussed at [place] of Stay Meetings, dates discussed:    Comments:  Med Review:  plavix

## 2013-10-12 ENCOUNTER — Encounter (HOSPITAL_COMMUNITY): Payer: Self-pay | Admitting: General Practice

## 2013-10-12 DIAGNOSIS — E1169 Type 2 diabetes mellitus with other specified complication: Secondary | ICD-10-CM | POA: Diagnosis not present

## 2013-10-12 DIAGNOSIS — I209 Angina pectoris, unspecified: Secondary | ICD-10-CM | POA: Diagnosis not present

## 2013-10-12 DIAGNOSIS — Z9861 Coronary angioplasty status: Secondary | ICD-10-CM

## 2013-10-12 DIAGNOSIS — E876 Hypokalemia: Secondary | ICD-10-CM

## 2013-10-12 DIAGNOSIS — R5381 Other malaise: Secondary | ICD-10-CM | POA: Diagnosis not present

## 2013-10-12 DIAGNOSIS — R5383 Other fatigue: Secondary | ICD-10-CM | POA: Diagnosis not present

## 2013-10-12 DIAGNOSIS — I10 Essential (primary) hypertension: Secondary | ICD-10-CM | POA: Diagnosis not present

## 2013-10-12 DIAGNOSIS — I2510 Atherosclerotic heart disease of native coronary artery without angina pectoris: Secondary | ICD-10-CM | POA: Diagnosis not present

## 2013-10-12 DIAGNOSIS — R0989 Other specified symptoms and signs involving the circulatory and respiratory systems: Secondary | ICD-10-CM | POA: Diagnosis not present

## 2013-10-12 DIAGNOSIS — R0609 Other forms of dyspnea: Secondary | ICD-10-CM | POA: Diagnosis not present

## 2013-10-12 LAB — CBC
HEMATOCRIT: 34 % — AB (ref 36.0–46.0)
HEMOGLOBIN: 11.7 g/dL — AB (ref 12.0–15.0)
MCH: 33.8 pg (ref 26.0–34.0)
MCHC: 34.4 g/dL (ref 30.0–36.0)
MCV: 98.3 fL (ref 78.0–100.0)
Platelets: 162 10*3/uL (ref 150–400)
RBC: 3.46 MIL/uL — AB (ref 3.87–5.11)
RDW: 13.9 % (ref 11.5–15.5)
WBC: 5.6 10*3/uL (ref 4.0–10.5)

## 2013-10-12 LAB — BASIC METABOLIC PANEL
Anion gap: 11 (ref 5–15)
BUN: 16 mg/dL (ref 6–23)
CO2: 25 meq/L (ref 19–32)
Calcium: 8.9 mg/dL (ref 8.4–10.5)
Chloride: 106 mEq/L (ref 96–112)
Creatinine, Ser: 0.77 mg/dL (ref 0.50–1.10)
GFR calc Af Amer: 88 mL/min — ABNORMAL LOW (ref >90)
GFR calc non Af Amer: 76 mL/min — ABNORMAL LOW (ref >90)
GLUCOSE: 173 mg/dL — AB (ref 70–99)
POTASSIUM: 3.4 meq/L — AB (ref 3.7–5.3)
SODIUM: 142 meq/L (ref 137–147)

## 2013-10-12 LAB — GLUCOSE, CAPILLARY: GLUCOSE-CAPILLARY: 164 mg/dL — AB (ref 70–99)

## 2013-10-12 MED ORDER — METFORMIN HCL 1000 MG PO TABS: 1000.0000 mg | ORAL_TABLET | Freq: Two times a day (BID) | ORAL | Status: DC

## 2013-10-12 MED ORDER — LIVING WELL WITH DIABETES BOOK
Freq: Once | Status: AC
  Administered 2013-10-12
  Filled 2013-10-12: qty 1

## 2013-10-12 MED FILL — Sodium Chloride IV Soln 0.9%: INTRAVENOUS | Qty: 50 | Status: AC

## 2013-10-12 NOTE — Discharge Instructions (Signed)
PLEASE REMEMBER TO BRING ALL OF YOUR MEDICATIONS TO EACH OF YOUR FOLLOW-UP OFFICE VISITS. ° °PLEASE ATTEND ALL SCHEDULED FOLLOW-UP APPOINTMENTS.  ° °Activity: Increase activity slowly as tolerated. You may shower, but no soaking baths (or swimming) for 1 week. No driving for 2 days. No lifting over 5 lbs for 1 week. No sexual activity for 1 week.  ° °You May Return to Work: in 1 week (if applicable) ° °Wound Care: You may wash cath site gently with soap and water. Keep cath site clean and dry. If you notice pain, swelling, bleeding or pus at your cath site, please call [phone]. ° ° ° °Cardiac Cath Site Care °Refer to this sheet in the next few weeks. These instructions provide you with information on caring for yourself after your procedure. Your caregiver may also give you more specific instructions. Your treatment has been planned according to current medical practices, but problems sometimes occur. Call your caregiver if you have any problems or questions after your procedure. °HOME CARE INSTRUCTIONS °· You may shower 24 hours after the procedure. Remove the bandage (dressing) and gently wash the site with plain soap and water. Gently pat the site dry.  °· Do not apply powder or lotion to the site.  °· Do not sit in a bathtub, swimming pool, or whirlpool for 5 to 7 days.  °· No bending, squatting, or lifting anything over 10 pounds (4.5 kg) as directed by your caregiver.  °· Inspect the site at least twice daily.  °· Do not drive home if you are discharged the same day of the procedure. Have someone else drive you.  °· You may drive 24 hours after the procedure unless otherwise instructed by your caregiver.  °What to expect: °· Any bruising will usually fade within 1 to 2 weeks.  °· Blood that collects in the tissue (hematoma) may be painful to the touch. It should usually decrease in size and tenderness within 1 to 2 weeks.  °SEEK IMMEDIATE MEDICAL CARE IF: °· You have unusual pain at the site or down the  affected limb.  °· You have redness, warmth, swelling, or pain at the site.  °· You have drainage (other than a small amount of blood on the dressing).  °· You have chills.  °· You have a fever or persistent symptoms for more than 72 hours.  °· You have a fever and your symptoms suddenly get worse.  °· Your leg becomes pale, cool, tingly, or numb.  °· You have heavy bleeding from the site. Hold pressure on the site.  °Document Released: 02/27/2010 Document Revised: 01/14/2011 Document Reviewed: 02/27/2010 °Exit[employer]® Patient Information ©2012 [employer]. ° °

## 2013-10-12 NOTE — Progress Notes (Signed)
Patient Name: Karen [name] Date of Encounter: 10/12/2013  Active Problems:   Angina pectoris    Patient Profile: [name] with history of CAD, diastolic dysfunction, HTN, and arthritis was evaluated for dyspnea on exertion and cath 03/2013 showed multivessel disease, treated medically. Her symptoms did not improve and she was admitted On 09/03 for PCI.   SUBJECTIVE: No chest pain, no shortness of breath, feels very well  OBJECTIVE Filed Vitals:   10/11/13 2000 10/11/13 2008 10/12/13 0009 10/12/13 0414  BP: 122/80 122/80 128/45 111/44  Pulse:  95 81 75  Temp:  98.4 F (36.9 C) 97.5 F (36.4 C) 98.2 F (36.8 C)  TempSrc:  Oral Oral Oral  Resp:  18 18 17   Height:      Weight:   177 lb 11.1 oz (80.6 kg)   SpO2:  90% 97% 96%    Intake/Output Summary (Last 24 hours) at 10/12/13 0809 Last data filed at 10/12/13 0640  Gross per 24 hour  Intake  956.1 ml  Output   1750 ml  Net -793.9 ml   Filed Weights   10/11/13 0847 10/12/13 0009  Weight: 175 lb (79.379 kg) 177 lb 11.1 oz (80.6 kg)    PHYSICAL EXAM General: Well developed, well nourished, female in no acute distress. Head: Normocephalic, atraumatic.  Neck: Supple without bruits, JVD not elevated. Lungs:  Resp regular and unlabored, CTA bilaterally. Heart: RRR, S1, S2, no S3, S4, or murmur; no rub. Abdomen: Soft, non-tender, non-distended, BS + x 4.  Extremities: No clubbing, cyanosis, no edema. Some ecchymosis but no hematoma Neuro: Alert and oriented X 3. Moves all extremities spontaneously. Psych: Normal affect.  LABS: CBC: Recent Labs  10/09/13 1346 10/12/13 0234  WBC 7.8 5.6  NEUTROABS 5.5  --   HGB 14.8 11.7*  HCT 42.3 34.0*  MCV 95.3 98.3  PLT 218 162   INR: Recent Labs  10/09/13 1344  INR [id number]   Basic Metabolic Panel: Recent Labs  10/09/13 1246 10/12/13 0234  NA 140 142  K 3.9 3.4*  CL 100 106  CO2 27 25  GLUCOSE 122* 173*  BUN 21 16  CREATININE 1.05 0.77  CALCIUM 10.7* 8.9    TELE:  Sinus rhythm, no critical ectopy      ECG: 10/12/2013 Sinus rhythm, no acute ischemic changes Vent. rate 78 BPM PR interval 194 ms QRS duration 116 ms QT/QTc 410/467 ms P-R-T axes 45 7 73  Current Medications:  . amLODipine  10 mg Oral Daily  . aspirin EC  81 mg Oral Daily  . calcium-vitamin D  1 tablet Oral Q breakfast  . clopidogrel  75 mg Oral Q breakfast  . docusate sodium  100 mg Oral Daily  . furosemide  80 mg Oral Daily  . irbesartan  300 mg Oral Daily  . linagliptin  5 mg Oral Daily  . magnesium oxide  400 mg Oral Daily  . metoprolol succinate  50 mg Oral Daily  . mirtazapine  15 mg Oral QHS  . multivitamin with minerals  1 tablet Oral Daily  . potassium chloride SA  20 mEq Oral Daily  . QUEtiapine  200 mg Oral QHS  . simvastatin  20 mg Oral QHS      ASSESSMENT AND PLAN: Active Problems:   Angina pectoris - s/p cath 09/03      Conclusions:  1. Successful stenting of the mid to distal LAD with DES.  2. Successful stenting of the mid  LCx with DES    Lipids - on low dose Zocor, may need stronger medication, will leave to MD    LE weakness with ambulation - pulses seem weak in both feet, but capillary refill is within normal limits. May need evaluation as an outpatient.  Plan: Cardiac rehabilitation to see, discharge today  Signed, Rosaria Ferries , PA-C 8:09 AM 10/12/2013   I have examined the patient and reviewed assessment and plan and discussed with patient.  Agree with above as stated. S/p PCI. Did well with rehab.  Right groin bleeding.  1+ right PT pulse.  Explained to her that bruising may spread down the leg and to look for firm area ..new area of bleeding.  She should avoid heavy lifting as well. DAPT for at least a year.    Enio Hornback S.

## 2013-10-12 NOTE — Discharge Summary (Signed)
CARDIOLOGY DISCHARGE SUMMARY   Patient ID: Karen [name] MRN: [id number] DOB/AGE: Apr 05, 1928 80 y.o.  Admit date: 10/11/2013 Discharge date: 10/12/2013  PCP: Tommy Medal, MD Primary Cardiologist: Dr. Sallyanne Kuster  Primary Discharge Diagnosis:  Angina pectoris - s/p 2.25 x 20 mm Promus DES to the mid/distal LAD and 3.5 x 12 mm Promus DES to the midcircumflex  Secondary Discharge Diagnosis:    Hypertension   Hypokalemia   Diabetes    Procedures: Cardiac catheterization, percutaneous intervention to the mid/distal LAD and mid circumflex  Hospital Course: Karen [name] is a 80 y.o. female with a history of CAD, diastolic dysfunction, HTN, and arthritis, who was evaluated for dyspnea on exertion and cath 04/2013 showed multivessel disease, treated medically. Her symptoms did not improve and she was admitted On 09/03 for PCI.  Cardiac catheterization results are below. She had successful percutaneous intervention to the circumflex and LAD. She tolerated the procedure well. Post-procedure, she had some groin bleeding, controlled with direct pressure.  On 09/04, she was seen by Dr. Beau Fanny and all data were reviewed. Her potassium level was slightly low and was supplemented. Her blood sugars were slightly elevated but her medications were all schedule because of the procedure and are being restarted. The exception his metformin which will be held for 48 hours.   The pulses in her right lower extremity were slightly decreased, and there was ecchymosis at her cath site, but no hematoma or bruit was noted and capillary refill was brisk. She was seen by cardiac rehabilitation and educated on stent restrictions, heart-healthy lifestyle modifications and exercise guidelines. She was ambulating without chest pain or shortness of breath and no further inpatient workup was indicated. She is considered stable for discharge, to follow up as an outpatient.   Labs:   Lab Results  Component Value  Date   WBC 5.6 10/12/2013   HGB 11.7* 10/12/2013   HCT 34.0* 10/12/2013   MCV 98.3 10/12/2013   PLT 162 10/12/2013     Recent Labs Lab 10/12/13 0234  NA 142  K 3.4*  CL 106  CO2 25  BUN 16  CREATININE 0.77  CALCIUM 8.9  GLUCOSE 173*    Recent Labs  10/09/13 1344  INR 0.99    Cardiac Cath: 10/11/2013 Lesion Data:  Vessel: LAD- mid to distal  Percent stenosis (pre): 90%  TIMI-flow (pre): 3  Stent: 2.25 x 20 mm Promus  Percent stenosis (post): 0%  TIMI-flow (post): 3  Vessel #2 LCx-mid  Percent stenosis pre- 80%  TIMI flow Pre- 3  Stent: 3.5 x 12 mm Promus  Percent stenosis-post- 0%  TIMI flow post- 3  Conclusions:  1. Successful stenting of the mid to distal LAD with DES.  2. Successful stenting of the mid LCx with DES  Recommendations: Continue DAPT for one year.  ECG: 10/12/2013  Sinus rhythm, no acute ischemic changes  Vent. rate 78 BPM  PR interval 194 ms  QRS duration 116 ms  QT/QTc 410/467 ms  P-R-T axes 45 7 73   FOLLOW UP PLANS AND APPOINTMENTS Allergies  Allergen Reactions  . Codeine Other (See Comments)    Unknown   . Lisinopril Cough  . Morphine And Related Other (See Comments)    Unknown   . Pneumococcal Vaccines Swelling and Other (See Comments)    Very bad pain in the arm of the shot  . Ultram [Tramadol Hcl] Itching     Medication List  amLODipine 10 MG tablet  Commonly known as:  NORVASC  Take 1 tablet (10 mg total) by mouth daily.     aspirin 81 MG tablet  Take 1 tablet (81 mg total) by mouth daily.     CALCIUM 600+D 600-400 MG-UNIT per tablet  Generic drug:  Calcium Carbonate-Vitamin D  Take 1 tablet by mouth daily.     clopidogrel 75 MG tablet  Commonly known as:  PLAVIX  Take 300 mg today ( 4 tablets ) then 75 mg daily     docusate sodium 100 MG capsule  Commonly known as:  COLACE  Take 100 mg by mouth daily.     furosemide 40 MG tablet  Commonly known as:  LASIX  Take 2 tablets (80 mg total) by mouth daily.       magnesium oxide 400 MG tablet  Commonly known as:  MAG-OX  Take 400 mg by mouth daily.     meloxicam 7.5 MG tablet  Commonly known as:  MOBIC  Take 7.5 mg by mouth 2 (two) times daily.     metFORMIN 1000 MG tablet  Commonly known as:  GLUCOPHAGE  Take 1 tablet (1,000 mg total) by mouth 2 (two) times daily with a meal. Hold for 2 days, restart on 10/14/2013     metoprolol succinate 50 MG 24 hr tablet  Commonly known as:  TOPROL-XL  Take 50 mg by mouth daily. Take with or immediately following a meal.     mirtazapine 15 MG tablet  Commonly known as:  REMERON  Take 15 mg by mouth at bedtime.     multivitamin tablet  Take 1 tablet by mouth daily.     potassium chloride SA 20 MEQ tablet  Commonly known as:  K-DUR,KLOR-CON  Take 20 mEq by mouth daily.     QUEtiapine 200 MG tablet  Commonly known as:  SEROQUEL  Take 200 mg by mouth at bedtime.     simvastatin 20 MG tablet  Commonly known as:  ZOCOR  Take 1 tablet (20 mg total) by mouth at bedtime.     sitaGLIPtin 100 MG tablet  Commonly known as:  JANUVIA  Take 100 mg by mouth daily.     SYSTANE OP  Place 1 drop into both eyes daily as needed (dry eyes).     valsartan 320 MG tablet  Commonly known as:  DIOVAN  Take 1 tablet (320 mg total) by mouth daily.        Discharge Instructions   Amb Referral to Cardiac Rehabilitation    Complete by:  As directed      Diet - low sodium heart healthy    Complete by:  As directed      Diet Carb Modified    Complete by:  As directed      Increase activity slowly    Complete by:  As directed           Follow-up Information   Follow up with Sanda Klein, MD. (The office will call)    Specialty:  Cardiology   Contact information:   [address] [place] [address] [place] [address] [phone]       BRING ALL MEDICATIONS WITH YOU TO FOLLOW UP APPOINTMENTS  Time spent with patient to include physician time: 45 min Signed: Rosaria Ferries, PA-C 10/12/2013, 10:39  AM Co-Sign MD  I have examined the patient and reviewed assessment and plan and discussed with patient. Agree with above as stated. S/p PCI. Did well with rehab.  Right groin bleeding. 1+ right PT pulse. Explained to her that bruising may spread down the leg and to look for firm area ..new area of bleeding. She should avoid heavy lifting as well. DAPT for at least a year.  Delva Derden S.

## 2013-10-12 NOTE — Progress Notes (Signed)
CARDIAC REHAB PHASE I   PRE:  Rate/Rhythm: 80 SR    BP: sitting 140/50    SaO2: 99 RA  MODE:  Ambulation: 200 ft   POST:  Rate/Rhythm: 103 ST    BP: sitting 170/76     SaO2:   Pt declined RW, able to walk fairly independently. Began to have SOB and leg fatigue at about 200 ft. Pt sts this is improved from what she has been experiencing. Recovered with rest. BP 170/76. Ed completed with good reception. Interested in [facility] and will send referral to [place]. [place], [place], ACSM 10/12/2013 9:05 AM

## 2013-11-05 ENCOUNTER — Ambulatory Visit (INDEPENDENT_AMBULATORY_CARE_PROVIDER_SITE_OTHER): Payer: Medicare Other | Admitting: Nurse Practitioner

## 2013-11-05 ENCOUNTER — Ambulatory Visit: Payer: Medicare Other | Admitting: Cardiology

## 2013-11-05 ENCOUNTER — Encounter: Payer: Self-pay | Admitting: Nurse Practitioner

## 2013-11-05 VITALS — BP 110/80 | HR 81 | Ht 64.5 in | Wt 173.8 lb

## 2013-11-05 DIAGNOSIS — Z955 Presence of coronary angioplasty implant and graft: Secondary | ICD-10-CM

## 2013-11-05 DIAGNOSIS — Z79899 Other long term (current) drug therapy: Secondary | ICD-10-CM | POA: Diagnosis not present

## 2013-11-05 DIAGNOSIS — Z9861 Coronary angioplasty status: Secondary | ICD-10-CM

## 2013-11-05 DIAGNOSIS — I2510 Atherosclerotic heart disease of native coronary artery without angina pectoris: Secondary | ICD-10-CM | POA: Diagnosis not present

## 2013-11-05 LAB — CBC
HCT: 38.7 % (ref 36.0–46.0)
Hemoglobin: 13.1 g/dL (ref 12.0–15.0)
MCHC: 34 g/dL (ref 30.0–36.0)
MCV: 100.1 fl — ABNORMAL HIGH (ref 78.0–100.0)
Platelets: 207 10*3/uL (ref 150.0–400.0)
RBC: 3.87 Mil/uL (ref 3.87–5.11)
RDW: 14 % (ref 11.5–15.5)
WBC: 8.6 10*3/uL (ref 4.0–10.5)

## 2013-11-05 LAB — BASIC METABOLIC PANEL
BUN: 23 mg/dL (ref 6–23)
CO2: 26 mEq/L (ref 19–32)
Calcium: 9.7 mg/dL (ref 8.4–10.5)
Chloride: 98 mEq/L (ref 96–112)
Creatinine, Ser: 1.1 mg/dL (ref 0.4–1.2)
GFR: 53.16 mL/min — ABNORMAL LOW (ref >60.00)
Glucose, Bld: 194 mg/dL — ABNORMAL HIGH (ref 70–99)
Potassium: 3.2 mEq/L — ABNORMAL LOW (ref 3.5–5.1)
Sodium: 137 mEq/L (ref 135–145)

## 2013-11-05 MED ORDER — MELOXICAM 7.5 MG PO TABS: 7.5000 mg | ORAL_TABLET | Freq: Every day | ORAL | Status: DC

## 2013-11-05 NOTE — Patient Instructions (Signed)
We will be checking the following labs today BMET and CBC.  I will see you in 2 months  Ok to start cardiac rehab  Stay on your current medicines but cut the Mobic back to just one a day  Call the [place] office at [phone] if you have any questions, problems or concerns.

## 2013-11-05 NOTE — Progress Notes (Signed)
Karen [name] Date of Birth: [birth year] Medical Record [id number]  History of Present Illness: Karen [name] is seen back today for a post hospital visit. Seen for Dr. Recardo Evangelist. She has a history of CAD, diastolic dysfunction, HTN and OA. Has had issues with DOE and was cathed in March of 2015 showing multivessel disease and was treated medically.   She failed on medical therapy - presented back to [facility] for PCI earlier this month. Had PCI to the LCX and LAD with DES. Committed to DAPT for one year.  Comes in today. Here alone. Doing very well. She feels "so much better". Says she "can breath" now. No chest pain - never had. Not dizzy or lightheaded. Still feels a little weakness in her hips and legs - but now feels this is probably not related to her heart. Feels good on her medicines. Had a hematoma post PCI - she says it was the size of her fist - now down to the size of an egg. Less bruising. She is on BID Mobic along with her DAPT. She starts cardiac rehab next week - has orientation later this week. She is very pleased with how she is doing.   Current Outpatient Prescriptions  Medication Sig Dispense Refill  . amLODipine (NORVASC) 10 MG tablet Take 1 tablet (10 mg total) by mouth daily.  90 tablet  3  . aspirin 81 MG tablet Take 1 tablet (81 mg total) by mouth daily.  90 tablet  3  . Calcium Carbonate-Vitamin D (CALCIUM 600+D) 600-400 MG-UNIT per tablet Take 1 tablet by mouth daily.      . clopidogrel (PLAVIX) 75 MG tablet Take 300 mg today ( 4 tablets ) then 75 mg daily  34 tablet  6  . docusate sodium (COLACE) 100 MG capsule Take 100 mg by mouth daily.      . furosemide (LASIX) 40 MG tablet Take 2 tablets (80 mg total) by mouth daily.  180 tablet  3  . magnesium oxide (MAG-OX) 400 MG tablet Take 400 mg by mouth daily.      . meloxicam (MOBIC) 7.5 MG tablet Take 7.5 mg by mouth 2 (two) times daily.       . metFORMIN (GLUCOPHAGE) 1000 MG tablet Take 1 tablet (1,000 mg total) by mouth  2 (two) times daily with a meal. Hold for 2 days, restart on 10/14/2013  60 tablet  0  . metoprolol succinate (TOPROL-XL) 50 MG 24 hr tablet Take 50 mg by mouth daily. Take with or immediately following a meal.      . mirtazapine (REMERON) 15 MG tablet Take 15 mg by mouth at bedtime.      . Multiple Vitamin (MULTIVITAMIN) tablet Take 1 tablet by mouth daily.      Vladimir Faster Glycol-Propyl Glycol (SYSTANE OP) Place 1 drop into both eyes daily as needed (dry eyes).      . potassium chloride SA (K-DUR,KLOR-CON) 20 MEQ tablet Take 20 mEq by mouth daily.       . QUEtiapine (SEROQUEL) 200 MG tablet Take 200 mg by mouth at bedtime.      . simvastatin (ZOCOR) 20 MG tablet Take 1 tablet (20 mg total) by mouth at bedtime.  90 tablet  3  . sitaGLIPtin (JANUVIA) 100 MG tablet Take 100 mg by mouth daily.      . valsartan (DIOVAN) 320 MG tablet Take 1 tablet (320 mg total) by mouth daily.  90 tablet  3   No current facility-administered  medications for this visit.    Allergies  Allergen Reactions  . Codeine Other (See Comments)    Unknown   . Lisinopril Cough  . Morphine And Related Other (See Comments)    Unknown   . Pneumococcal Vaccines Swelling and Other (See Comments)    Very bad pain in the arm of the shot  . Ultram [Tramadol Hcl] Itching    Past Medical History  Diagnosis Date  . Hypertension   . H/O: gout   . Fibromyalgia   . Constipation   . Hx: UTI (urinary tract infection)   . Arthritis   . Coronary artery disease 04/2013    Three-vessel disease, initial medical therapy then stenting of the LAD and CFX 10/2013  . Diabetes mellitus     TYPE 2    Past Surgical History  Procedure Laterality Date  . Knee surgery  1997/2000  . Replacement total hip w/  resurfacing implants  [id number]  . Spinal fusion  2006  . Joint replacement  left hip-2007, left knee-2000, right knee-1997    Left Hip, Bilateral Knee replacements  . Back surgery  1972  . Closed reduction of left total hip   2008    x 3  . Right foot surgery  2004    x 2  . Appendectomy  1951  . Cholecystectomy  1951  . Hernia repair  1963  . Salivary gland stone extraction    . Cardiac catheterization  04/2013    Left main 30-40%, prox LAD 40%, mid/distal LAD 90%, CFX 70%, OM 3 90%, RCA 40-50%, PDA 20-80%, EF 50%   . Coronary stent placement  10/11/2013    2.25 x 20 mm Promus DES to mid/distal LAD & 3.5 x 12 mm Promus DES Mid CX         DR [place]    History  Smoking status  . Never Smoker   Smokeless tobacco  . Never Used    History  Alcohol Use No    Family History  Problem Relation Age of Onset  . Heart attack Father   . Stroke Father   . Cancer Mother   . Heart attack Brother   . Cancer Brother     Review of Systems: The review of systems is per the HPI.  All other systems were reviewed and are negative.  Physical Exam: BP 110/80  Pulse 81  Ht 5' 4.5" (1.638 m)  Wt 173 lb 12.8 oz (78.835 kg)  BMI 29.38 kg/m2  SpO2 97% Patient is very pleasant and in no acute distress. Very energetic. Looks younger than her stated age. Skin is warm and dry. Color is normal.  HEENT is unremarkable. Normocephalic/atraumatic. PERRL. Sclera are nonicteric. Neck is supple. No masses. No JVD. Lungs are clear. Cardiac exam shows a regular rate and rhythm. Abdomen is soft. Extremities are without edema. Right groin stable with resolving bruising and hematoma. Gait and ROM are intact. No gross neurologic deficits noted.  Wt Readings from Last 3 Encounters:  11/05/13 173 lb 12.8 oz (78.835 kg)  10/12/13 177 lb 11.1 oz (80.6 kg)  10/12/13 177 lb 11.1 oz (80.6 kg)    LABORATORY DATA/PROCEDURES:  Lab Results  Component Value Date   WBC 5.6 10/12/2013   HGB 11.7* 10/12/2013   HCT 34.0* 10/12/2013   PLT 162 10/12/2013   GLUCOSE 173* 10/12/2013   ALT 18 07/05/2013   AST 20 07/05/2013   NA 142 10/12/2013   K 3.4* 10/12/2013   CL 106  10/12/2013   CREATININE 0.77 10/12/2013   BUN 16 10/12/2013   CO2 25 10/12/2013   TSH  7.030* 05/16/2012   INR 0.99 10/09/2013    BNP (last 3 results)  Recent Labs  07/05/13 1658  PROBNP 229.60     Assessment / Plan: 1. CAD with recent 2 vessel PCI - on DAPT for one year minimum. She is doing very well. No change in her current regimen. I will see her back in 2 months.   2. HTN -  BP is great - no change in therapy.   3. HLD -  On statin.   Patient is agreeable to this plan and will call if any problems develop in the interim.   Burtis Junes, RN, [facility] [address] [place], [place]  [phone]

## 2013-11-06 ENCOUNTER — Telehealth: Payer: Self-pay | Admitting: Cardiovascular Disease

## 2013-11-06 ENCOUNTER — Other Ambulatory Visit: Payer: Self-pay | Admitting: *Deleted

## 2013-11-06 DIAGNOSIS — E876 Hypokalemia: Secondary | ICD-10-CM

## 2013-11-06 MED ORDER — POTASSIUM CHLORIDE CRYS ER 20 MEQ PO TBCR: 20.0000 meq | EXTENDED_RELEASE_TABLET | Freq: Two times a day (BID) | ORAL | Status: DC

## 2013-11-06 NOTE — Telephone Encounter (Signed)
Lelan Pons said that she refaxed the AAA parameter paperwork for this patient. Call back if you need to  Thanks

## 2013-11-06 NOTE — Telephone Encounter (Signed)
Paper work filled out and signed by Dr. Loletha Grayer and faxed.

## 2013-11-07 ENCOUNTER — Telehealth: Payer: Self-pay | Admitting: *Deleted

## 2013-11-07 NOTE — Telephone Encounter (Signed)
Exercise parameters for patient with AAA's filled out and signed by Dr. Loletha Grayer and faxed 11/06/13.

## 2013-11-08 ENCOUNTER — Encounter (HOSPITAL_COMMUNITY)
Admission: RE | Admit: 2013-11-08 | Discharge: 2013-11-08 | Disposition: A | Discharge status: Home / Self Care | Payer: Medicare Other | Source: Ambulatory Visit | Attending: Cardiovascular Disease | Admitting: Cardiovascular Disease

## 2013-11-08 DIAGNOSIS — M129 Arthropathy, unspecified: Secondary | ICD-10-CM | POA: Insufficient documentation

## 2013-11-08 DIAGNOSIS — I10 Essential (primary) hypertension: Secondary | ICD-10-CM | POA: Insufficient documentation

## 2013-11-08 DIAGNOSIS — Z96659 Presence of unspecified artificial knee joint: Secondary | ICD-10-CM | POA: Insufficient documentation

## 2013-11-08 DIAGNOSIS — Z5189 Encounter for other specified aftercare: Secondary | ICD-10-CM | POA: Insufficient documentation

## 2013-11-08 DIAGNOSIS — E119 Type 2 diabetes mellitus without complications: Secondary | ICD-10-CM | POA: Insufficient documentation

## 2013-11-08 DIAGNOSIS — I259 Chronic ischemic heart disease, unspecified: Secondary | ICD-10-CM | POA: Insufficient documentation

## 2013-11-08 DIAGNOSIS — Z96649 Presence of unspecified artificial hip joint: Secondary | ICD-10-CM | POA: Insufficient documentation

## 2013-11-08 DIAGNOSIS — I209 Angina pectoris, unspecified: Secondary | ICD-10-CM | POA: Insufficient documentation

## 2013-11-08 DIAGNOSIS — I2510 Atherosclerotic heart disease of native coronary artery without angina pectoris: Secondary | ICD-10-CM | POA: Insufficient documentation

## 2013-11-08 DIAGNOSIS — Z955 Presence of coronary angioplasty implant and graft: Secondary | ICD-10-CM | POA: Insufficient documentation

## 2013-11-08 DIAGNOSIS — M797 Fibromyalgia: Secondary | ICD-10-CM | POA: Insufficient documentation

## 2013-11-08 DIAGNOSIS — I5042 Chronic combined systolic (congestive) and diastolic (congestive) heart failure: Secondary | ICD-10-CM | POA: Insufficient documentation

## 2013-11-08 NOTE — Progress Notes (Signed)
Cardiac Rehab Medication Review by a Pharmacist  Does the patient  feel that his/her medications are working for him/her?  yes  Has the patient been experiencing any side effects to the medications prescribed?  no  Does the patient measure his/her own blood pressure or blood glucose at home?  yes -- checks BG at home in the morning, not BP but is considering buying a home monitor.  Does the patient have any problems obtaining medications due to transportation or finances?   No. Patient goes to [facility] [place] for medication ([facility]).   Understanding of regimen: excellent Understanding of indications: excellent Potential of compliance: excellent  Pharmacist comments: Patient is a very pleasant 80 y/o female who presents today to cardiac rehab for review of her medications. Patient has an excellent understanding of her medications and can list the names, doses, and indications from memory. Not experiencing any side effects, no issues with access to medications. Patient compliance is good. States that she likes to cook a lot and lives with her son. She states that her morning BG readings have been a little higher, ranging from 140s-160s. She hopes that readings will come down with exercise 3x/week at cardiac rehab. Will follow-up soon with MD.  [name], Pharm.D Clinical Pharmacy Resident Pager: [phone] 11/08/2013 8:26 AM     Karen [name], [name] 11/08/2013 8:17 AM

## 2013-11-12 ENCOUNTER — Encounter (HOSPITAL_COMMUNITY)
Admission: RE | Admit: 2013-11-12 | Discharge: 2013-11-12 | Disposition: A | Discharge status: Home / Self Care | Payer: Medicare Other | Source: Ambulatory Visit | Attending: Cardiovascular Disease | Admitting: Cardiovascular Disease

## 2013-11-12 DIAGNOSIS — M797 Fibromyalgia: Secondary | ICD-10-CM | POA: Diagnosis not present

## 2013-11-12 DIAGNOSIS — I259 Chronic ischemic heart disease, unspecified: Secondary | ICD-10-CM | POA: Diagnosis not present

## 2013-11-12 DIAGNOSIS — Z5189 Encounter for other specified aftercare: Secondary | ICD-10-CM | POA: Diagnosis not present

## 2013-11-12 DIAGNOSIS — Z955 Presence of coronary angioplasty implant and graft: Secondary | ICD-10-CM | POA: Diagnosis not present

## 2013-11-12 DIAGNOSIS — M129 Arthropathy, unspecified: Secondary | ICD-10-CM | POA: Diagnosis not present

## 2013-11-12 DIAGNOSIS — I5042 Chronic combined systolic (congestive) and diastolic (congestive) heart failure: Secondary | ICD-10-CM | POA: Diagnosis not present

## 2013-11-12 DIAGNOSIS — E119 Type 2 diabetes mellitus without complications: Secondary | ICD-10-CM | POA: Diagnosis not present

## 2013-11-12 DIAGNOSIS — I2510 Atherosclerotic heart disease of native coronary artery without angina pectoris: Secondary | ICD-10-CM | POA: Diagnosis not present

## 2013-11-12 DIAGNOSIS — I10 Essential (primary) hypertension: Secondary | ICD-10-CM | POA: Diagnosis not present

## 2013-11-12 DIAGNOSIS — Z96649 Presence of unspecified artificial hip joint: Secondary | ICD-10-CM | POA: Diagnosis not present

## 2013-11-12 DIAGNOSIS — Z96659 Presence of unspecified artificial knee joint: Secondary | ICD-10-CM | POA: Diagnosis not present

## 2013-11-12 DIAGNOSIS — I209 Angina pectoris, unspecified: Secondary | ICD-10-CM | POA: Diagnosis not present

## 2013-11-12 LAB — GLUCOSE, CAPILLARY
Glucose-Capillary: 115 mg/dL — ABNORMAL HIGH (ref 70–99)
Glucose-Capillary: 151 mg/dL — ABNORMAL HIGH (ref 70–99)

## 2013-11-12 NOTE — Progress Notes (Signed)
Pt started cardiac rehab today.  Pt tolerated light exercise without difficulty. Telemetry rhythm Sinus without ectopy noted. Vital signs stable. Meganne's PHQ score was a zero. Rickey's short and long term goals are to walk without pain in her legs and hips and to loose weight. Millee did complain of some left lower leg discomfort on the track. Patient encouraged to take sit down rest breaks as needed. Luwana says she has not been exercising for the past year but feels better since her stent placement. Will continue to monitor the patient throughout  the program.

## 2013-11-14 ENCOUNTER — Encounter (HOSPITAL_COMMUNITY)
Admission: RE | Admit: 2013-11-14 | Discharge: 2013-11-14 | Disposition: A | Discharge status: Home / Self Care | Payer: Medicare Other | Source: Ambulatory Visit | Attending: Cardiovascular Disease | Admitting: Cardiovascular Disease

## 2013-11-14 ENCOUNTER — Other Ambulatory Visit (INDEPENDENT_AMBULATORY_CARE_PROVIDER_SITE_OTHER): Payer: Medicare Other | Admitting: *Deleted

## 2013-11-14 DIAGNOSIS — E876 Hypokalemia: Secondary | ICD-10-CM | POA: Diagnosis not present

## 2013-11-14 DIAGNOSIS — Z5189 Encounter for other specified aftercare: Secondary | ICD-10-CM | POA: Diagnosis not present

## 2013-11-14 LAB — GLUCOSE, CAPILLARY
GLUCOSE-CAPILLARY: 115 mg/dL — AB (ref 70–99)
Glucose-Capillary: 121 mg/dL — ABNORMAL HIGH (ref 70–99)

## 2013-11-14 LAB — BASIC METABOLIC PANEL
BUN: 23 mg/dL (ref 6–23)
CO2: 27 mEq/L (ref 19–32)
Calcium: 9.7 mg/dL (ref 8.4–10.5)
Chloride: 102 mEq/L (ref 96–112)
Creatinine, Ser: 1 mg/dL (ref 0.4–1.2)
GFR: 56.24 mL/min — ABNORMAL LOW (ref >60.00)
Glucose, Bld: 125 mg/dL — ABNORMAL HIGH (ref 70–99)
Potassium: 3.8 mEq/L (ref 3.5–5.1)
Sodium: 138 mEq/L (ref 135–145)

## 2013-11-15 DIAGNOSIS — H35033 Hypertensive retinopathy, bilateral: Secondary | ICD-10-CM | POA: Diagnosis not present

## 2013-11-16 ENCOUNTER — Encounter (HOSPITAL_COMMUNITY)
Admission: RE | Admit: 2013-11-16 | Discharge: 2013-11-16 | Disposition: A | Discharge status: Home / Self Care | Payer: Medicare Other | Source: Ambulatory Visit | Attending: Cardiovascular Disease | Admitting: Cardiovascular Disease

## 2013-11-16 DIAGNOSIS — Z5189 Encounter for other specified aftercare: Secondary | ICD-10-CM | POA: Diagnosis not present

## 2013-11-16 DIAGNOSIS — H35033 Hypertensive retinopathy, bilateral: Secondary | ICD-10-CM | POA: Diagnosis not present

## 2013-11-16 LAB — GLUCOSE, CAPILLARY
GLUCOSE-CAPILLARY: 112 mg/dL — AB (ref 70–99)
Glucose-Capillary: 114 mg/dL — ABNORMAL HIGH (ref 70–99)

## 2013-11-19 ENCOUNTER — Encounter (HOSPITAL_COMMUNITY)
Admission: RE | Admit: 2013-11-19 | Discharge: 2013-11-19 | Disposition: A | Discharge status: Home / Self Care | Payer: Medicare Other | Source: Ambulatory Visit | Attending: Cardiovascular Disease | Admitting: Cardiovascular Disease

## 2013-11-19 DIAGNOSIS — Z5189 Encounter for other specified aftercare: Secondary | ICD-10-CM | POA: Diagnosis not present

## 2013-11-19 LAB — GLUCOSE, CAPILLARY: Glucose-Capillary: 95 mg/dL (ref 70–99)

## 2013-11-21 ENCOUNTER — Encounter (HOSPITAL_COMMUNITY)
Admission: RE | Admit: 2013-11-21 | Discharge: 2013-11-21 | Disposition: A | Discharge status: Home / Self Care | Payer: Medicare Other | Source: Ambulatory Visit | Attending: Cardiovascular Disease | Admitting: Cardiovascular Disease

## 2013-11-21 DIAGNOSIS — Z5189 Encounter for other specified aftercare: Secondary | ICD-10-CM | POA: Diagnosis not present

## 2013-11-21 LAB — GLUCOSE, CAPILLARY: Glucose-Capillary: 112 mg/dL — ABNORMAL HIGH (ref 70–99)

## 2013-11-23 ENCOUNTER — Encounter (HOSPITAL_COMMUNITY)
Admission: RE | Admit: 2013-11-23 | Discharge: 2013-11-23 | Disposition: A | Discharge status: Home / Self Care | Payer: Medicare Other | Source: Ambulatory Visit | Attending: Cardiovascular Disease | Admitting: Cardiovascular Disease

## 2013-11-23 DIAGNOSIS — Z5189 Encounter for other specified aftercare: Secondary | ICD-10-CM | POA: Diagnosis not present

## 2013-11-23 LAB — GLUCOSE, CAPILLARY: GLUCOSE-CAPILLARY: 122 mg/dL — AB (ref 70–99)

## 2013-11-23 NOTE — Progress Notes (Signed)
PSYCHOSOCIAL ASSESSMENT  Pt psychosocial assessment reveals no barriers to rehab participation.  Pt quality of life is slightly altered by her physical constraints which limits her ability to perform tasks as prior to her illness. Pt c/o leg fatigue which is somewhat resolving with cardiac rehab.  Pt reports she is enjoying cardiac rehab.   Pt exhibits positive coping skills and has supportive family.  Offered emotional support and reassurance.  Will continue to monitor.

## 2013-11-23 NOTE — Progress Notes (Signed)
I have reviewed home exercise with [facility]. The patient was advised to walk 2-4 days per week outside of CRP II for 15 minutes, 2 times per day until she can walk 30 minutes continuously.  Pt will also complete one additional day of hand weights outside of CRP II.  Progression of exercise prescription was discussed.  Reviewed THR, pulse, RPE, sign and symptoms, NTG use and when to call 911 or MD.  Pt voiced understanding.  [place], MS, ACSM RCEP 11/23/2013 2:44 PM

## 2013-11-26 ENCOUNTER — Encounter (HOSPITAL_COMMUNITY)
Admission: RE | Admit: 2013-11-26 | Discharge: 2013-11-26 | Disposition: A | Discharge status: Home / Self Care | Payer: Medicare Other | Source: Ambulatory Visit | Attending: Cardiovascular Disease | Admitting: Cardiovascular Disease

## 2013-11-26 DIAGNOSIS — Z5189 Encounter for other specified aftercare: Secondary | ICD-10-CM | POA: Diagnosis not present

## 2013-11-27 ENCOUNTER — Ambulatory Visit: Payer: Medicare Other | Admitting: Gynecology

## 2013-11-28 ENCOUNTER — Ambulatory Visit (INDEPENDENT_AMBULATORY_CARE_PROVIDER_SITE_OTHER): Payer: Medicare Other | Admitting: Gynecology

## 2013-11-28 ENCOUNTER — Telehealth (HOSPITAL_COMMUNITY): Payer: Self-pay | Admitting: Internal Medicine

## 2013-11-28 ENCOUNTER — Encounter (HOSPITAL_COMMUNITY): Payer: Medicare Other

## 2013-11-28 ENCOUNTER — Encounter: Payer: Self-pay | Admitting: Gynecology

## 2013-11-28 VITALS — BP 138/80 | HR 78 | Resp 12 | Ht 63.0 in | Wt 171.0 lb

## 2013-11-28 DIAGNOSIS — A630 Anogenital (venereal) warts: Secondary | ICD-10-CM | POA: Diagnosis not present

## 2013-11-28 DIAGNOSIS — I2510 Atherosclerotic heart disease of native coronary artery without angina pectoris: Secondary | ICD-10-CM

## 2013-11-28 NOTE — Progress Notes (Signed)
80 y.o. Widowed Caucasian female   [id number] here for recheck or her recurrent vulvar wart.  Pt has been treated with excision, TCA most recently 11/2012, completed in 05/2013.   Pt is without other gyn complaints, she had recent stent placements and feels much better, SOB improved.  She does not report post-menopasual bleeding.  Pt sees Dr Minna Antis for general care.    Patient's last menstrual period was 05/10/1998.          Sexually active: No.  The current method of family planning is post menopausal status.    Exercising: Yes.    walking, cardiac rehab 2x/wk Last pap: Abnormal PAP:  Mammogram:  09/24/13 Bi-Rads 2: Benign BSE: yes  Colonoscopy: 5 years ago- Normal DEXA: 2014  Alcohol: no Tobacco: no  Labs: Tommy Medal, MD   Health Maintenance  Topic Date Due  . Hemoglobin A1c  1928-09-03  . Foot Exam  07/21/1940  . Ophthalmology Exam  07/21/1940  . Urine Microalbumin  07/21/1940  . Tetanus/tdap  07/21/1949  . Colonoscopy  07/21/1980  . Zostavax  07/22/1990  . Pneumococcal Polysaccharide Vaccine Age 52 And Over  07/22/1995  . Influenza Vaccine  09/08/2013    Family History  Problem Relation Age of Onset  . Heart attack Father   . Stroke Father   . Cancer Mother   . Heart attack Brother   . Cancer Brother     Patient Active Problem List   Diagnosis Date Noted  . Angina pectoris 10/11/2013  . Dyspnea on exertion 10/09/2013  . Cardiomyopathy, mixed hypertensive and ischemic etiology 05/27/2013  . Hyperlipidemia 05/27/2013  . CAD (coronary artery disease) 05/27/2013  . Acute on chronic combined systolic and diastolic congestive heart failure 04/12/2013  . Aneurysm of aorta 01/19/2013  . Altered mental status 05/16/2012  . Hallucinations 05/16/2012  . Hypokalemia 05/16/2012  . Diabetes mellitus 05/16/2012  . HTN (hypertension) 05/16/2012    Past Medical History  Diagnosis Date  . Hypertension   . H/O: gout   . Fibromyalgia   . Constipation   . Hx: UTI (urinary  tract infection)   . Arthritis   . Coronary artery disease 04/2013    Three-vessel disease, initial medical therapy then stenting of the LAD and CFX 10/2013  . Diabetes mellitus     TYPE 2    Past Surgical History  Procedure Laterality Date  . Knee surgery  1997/2000  . Replacement total hip w/  resurfacing implants  [id number]  . Spinal fusion  2006  . Joint replacement  left hip-2007, left knee-2000, right knee-1997    Left Hip, Bilateral Knee replacements  . Back surgery  1972  . Closed reduction of left total hip  2008    x 3  . Right foot surgery  2004    x 2  . Appendectomy  1951  . Cholecystectomy  1951  . Hernia repair  1963  . Salivary gland stone extraction    . Cardiac catheterization  04/2013    Left main 30-40%, prox LAD 40%, mid/distal LAD 90%, CFX 70%, OM 3 90%, RCA 40-50%, PDA 20-80%, EF 50%   . Coronary stent placement  10/11/2013    2.25 x 20 mm Promus DES to mid/distal LAD & 3.5 x 12 mm Promus DES Mid CX         DR [place]    Allergies: Lisinopril; Pneumococcal vaccines; Codeine; Morphine and related; and Ultram  Current Outpatient Prescriptions  Medication Sig Dispense Refill  . amLODipine (  NORVASC) 10 MG tablet Take 1 tablet (10 mg total) by mouth daily.  90 tablet  3  . aspirin 81 MG tablet Take 1 tablet (81 mg total) by mouth daily.  90 tablet  3  . Calcium Carbonate-Vitamin D (CALCIUM 600+D) 600-400 MG-UNIT per tablet Take 1 tablet by mouth daily.      . clopidogrel (PLAVIX) 75 MG tablet Take 75 mg by mouth daily. Take 300 mg today ( 4 tablets ) then 75 mg daily      . furosemide (LASIX) 40 MG tablet Take 2 tablets (80 mg total) by mouth daily.  180 tablet  3  . meloxicam (MOBIC) 7.5 MG tablet Take 1 tablet (7.5 mg total) by mouth daily.      . metFORMIN (GLUCOPHAGE) 1000 MG tablet Take 1 tablet (1,000 mg total) by mouth 2 (two) times daily with a meal. Hold for 2 days, restart on 10/14/2013  60 tablet  0  . metoprolol succinate (TOPROL-XL) 50 MG 24 hr  tablet Take 50 mg by mouth daily. Take with or immediately following a meal.      . mirtazapine (REMERON) 15 MG tablet Take 15 mg by mouth at bedtime.      . Multiple Vitamin (MULTIVITAMIN) tablet Take 1 tablet by mouth daily.      Vladimir Faster Glycol-Propyl Glycol (SYSTANE OP) Place 1 drop into both eyes daily as needed (dry eyes).      . potassium chloride SA (K-DUR,KLOR-CON) 20 MEQ tablet Take 1 tablet (20 mEq total) by mouth 2 (two) times daily.  60 tablet  3  . QUEtiapine (SEROQUEL) 200 MG tablet Take 200 mg by mouth at bedtime.      . simvastatin (ZOCOR) 20 MG tablet Take 1 tablet (20 mg total) by mouth at bedtime.  90 tablet  3  . sitaGLIPtin (JANUVIA) 100 MG tablet Take 100 mg by mouth daily.      . valsartan (DIOVAN) 320 MG tablet Take 1 tablet (320 mg total) by mouth daily.  90 tablet  3   No current facility-administered medications for this visit.    ROS: Pertinent items are noted in HPI.  Exam:    BP 138/80  Pulse 78  Resp 12  Ht 5\' 3"  (1.6 m)  Wt 171 lb (77.565 kg)  BMI 30.30 kg/m2  LMP 05/10/1998 Weight change: @WEIGHTCHANGE @ Last 3 height recordings:  Ht Readings from Last 3 Encounters:  11/28/13 5\' 3"  (1.6 m)  11/08/13 5' 2.75" (1.594 m)  11/05/13 5' 4.5" (1.638 m)   General appearance: alert, cooperative and appears stated age Extremities: extremities normal, atraumatic, no cyanosis or edema Skin: Skin color, texture, turgor normal. No rashes or lesions Lymph nodes: Cervical, supraclavicular, and axillary nodes normal. no inguinal nodes palpated Neurologic: Grossly normal   Pelvic: External genitalia:  Atrophic changes, small single wart arising from area of previously treated area noted-right                 P: recurrent vulvar wart Based on history, requests treatment today Surrounding tissue  treated with petroleum jelly, wart treated with 80% TCA-tolerated well, no other lesions noted F/u 1w An After Visit Summary was printed and given to the  patient.

## 2013-11-30 ENCOUNTER — Encounter (HOSPITAL_COMMUNITY)
Admission: RE | Admit: 2013-11-30 | Discharge: 2013-11-30 | Disposition: A | Discharge status: Home / Self Care | Payer: Medicare Other | Source: Ambulatory Visit | Attending: Cardiovascular Disease | Admitting: Cardiovascular Disease

## 2013-11-30 DIAGNOSIS — Z5189 Encounter for other specified aftercare: Secondary | ICD-10-CM | POA: Diagnosis not present

## 2013-12-03 ENCOUNTER — Encounter (HOSPITAL_COMMUNITY)
Admission: RE | Admit: 2013-12-03 | Discharge: 2013-12-03 | Disposition: A | Discharge status: Home / Self Care | Payer: Medicare Other | Source: Ambulatory Visit | Attending: Cardiovascular Disease | Admitting: Cardiovascular Disease

## 2013-12-03 DIAGNOSIS — Z5189 Encounter for other specified aftercare: Secondary | ICD-10-CM | POA: Diagnosis not present

## 2013-12-04 ENCOUNTER — Encounter: Payer: Self-pay | Admitting: Gynecology

## 2013-12-04 ENCOUNTER — Ambulatory Visit (INDEPENDENT_AMBULATORY_CARE_PROVIDER_SITE_OTHER): Payer: Medicare Other | Admitting: Gynecology

## 2013-12-04 VITALS — BP 128/78 | Resp 14 | Ht 63.0 in | Wt 171.0 lb

## 2013-12-04 DIAGNOSIS — A630 Anogenital (venereal) warts: Secondary | ICD-10-CM | POA: Diagnosis not present

## 2013-12-04 DIAGNOSIS — I2510 Atherosclerotic heart disease of native coronary artery without angina pectoris: Secondary | ICD-10-CM

## 2013-12-04 NOTE — Progress Notes (Signed)
Subjective:     Patient ID: Karen [name], female   DOB: 14-Jan-1929, 80 y.o.   MRN: [id number]  HPI Comments: Pt here for follow up for treatment of vulvar wart.  Pt reports "something" felll off.  No complaint    Review of Systems Per HPI    Objective:   Physical Exam  Nursing note and vitals reviewed. Constitutional: She is oriented to person, place, and time. She appears well-developed and well-nourished.  Neurological: She is alert and oriented to person, place, and time.  External genitalia: right labia with smooth, round erythematous area c/w prior treatment site     Assessment:     Recurrent genital wart-treated    Plan:     F/u prn

## 2013-12-05 ENCOUNTER — Encounter (HOSPITAL_COMMUNITY)
Admission: RE | Admit: 2013-12-05 | Discharge: 2013-12-05 | Disposition: A | Discharge status: Home / Self Care | Payer: Medicare Other | Source: Ambulatory Visit | Attending: Cardiovascular Disease | Admitting: Cardiovascular Disease

## 2013-12-05 DIAGNOSIS — Z5189 Encounter for other specified aftercare: Secondary | ICD-10-CM | POA: Diagnosis not present

## 2013-12-07 ENCOUNTER — Encounter (HOSPITAL_COMMUNITY)
Admission: RE | Admit: 2013-12-07 | Discharge: 2013-12-07 | Disposition: A | Discharge status: Home / Self Care | Payer: Medicare Other | Source: Ambulatory Visit | Attending: Cardiovascular Disease | Admitting: Cardiovascular Disease

## 2013-12-07 DIAGNOSIS — Z5189 Encounter for other specified aftercare: Secondary | ICD-10-CM | POA: Diagnosis not present

## 2013-12-10 ENCOUNTER — Encounter: Payer: Self-pay | Admitting: Gynecology

## 2013-12-10 ENCOUNTER — Encounter (HOSPITAL_COMMUNITY)
Admission: RE | Admit: 2013-12-10 | Discharge: 2013-12-10 | Disposition: A | Discharge status: Home / Self Care | Payer: Medicare Other | Source: Ambulatory Visit | Attending: Cardiovascular Disease | Admitting: Cardiovascular Disease

## 2013-12-10 DIAGNOSIS — I10 Essential (primary) hypertension: Secondary | ICD-10-CM | POA: Insufficient documentation

## 2013-12-10 DIAGNOSIS — Z96649 Presence of unspecified artificial hip joint: Secondary | ICD-10-CM | POA: Diagnosis not present

## 2013-12-10 DIAGNOSIS — Z955 Presence of coronary angioplasty implant and graft: Secondary | ICD-10-CM | POA: Diagnosis not present

## 2013-12-10 DIAGNOSIS — I2510 Atherosclerotic heart disease of native coronary artery without angina pectoris: Secondary | ICD-10-CM | POA: Diagnosis not present

## 2013-12-10 DIAGNOSIS — I5042 Chronic combined systolic (congestive) and diastolic (congestive) heart failure: Secondary | ICD-10-CM | POA: Insufficient documentation

## 2013-12-10 DIAGNOSIS — Z5189 Encounter for other specified aftercare: Secondary | ICD-10-CM | POA: Insufficient documentation

## 2013-12-10 DIAGNOSIS — M129 Arthropathy, unspecified: Secondary | ICD-10-CM | POA: Diagnosis not present

## 2013-12-10 DIAGNOSIS — M797 Fibromyalgia: Secondary | ICD-10-CM | POA: Insufficient documentation

## 2013-12-10 DIAGNOSIS — E119 Type 2 diabetes mellitus without complications: Secondary | ICD-10-CM | POA: Diagnosis not present

## 2013-12-10 DIAGNOSIS — I209 Angina pectoris, unspecified: Secondary | ICD-10-CM | POA: Diagnosis not present

## 2013-12-10 DIAGNOSIS — I259 Chronic ischemic heart disease, unspecified: Secondary | ICD-10-CM | POA: Insufficient documentation

## 2013-12-10 DIAGNOSIS — Z96659 Presence of unspecified artificial knee joint: Secondary | ICD-10-CM | POA: Diagnosis not present

## 2013-12-10 NOTE — Progress Notes (Signed)
Karen [name] 80 y.o. female Nutrition Note Spoke with pt.  Nutrition Plan and Nutrition Survey goals reviewed with pt. Pt is following Step 2 of the Therapeutic Lifestyle Changes diet. Pt wants to lose wt. Pt has been trying to lose wt by "exercising more and eating smaller portions." Pt states she has lost 3 lb over the past 3 weeks. Wt loss tips reviewed.  Pt is diabetic. No recent A1c noted. Per pt, last A1c was 7.0, which indicates blood glucose well-controlled for pt's age. Pt checks fasting CBG's daily. CBG's reportedly 119-130 mg/dL. This Probation officer went over Diabetes Education test results. Pt expressed understanding of the information reviewed. Pt aware of nutrition education classes offered and has attended 3 nutrition classes.  Nutrition Diagnosis ? Food-and nutrition-related knowledge deficit related to lack of exposure to information as related to diagnosis of: ? CVD ? DM  ? Obesity related to excessive energy intake as evidenced by a BMI of 31.4  Nutrition RX/ Estimated Daily Nutrition Needs for: wt loss  1200-1500 Kcal, 30-40 gm fat, 9-11 gm sat fat, 1.2-1.5 gm trans-fat, <1500 mg sodium, 150-175 gm CHO   Nutrition Intervention ? Pt's individual nutrition plan reviewed with pt. ? Benefits of adopting Therapeutic Lifestyle Changes discussed when Medficts reviewed. ? Pt to attend the Portion Distortion class ? Pt to attend the  ? Nutrition I class - met; 11/13/13                    ? Nutrition II class - met; 11/20/13        ? Diabetes Blitz class - met; 11/27/13       ? Diabetes Q & A class - met; 11/16/13 ? Continue client-centered nutrition education by RD, as part of interdisciplinary care. Goal(s) ? Pt to identify food quantities necessary to achieve: ? wt loss to a goal wt of 152-170 lb (68.9-77.1 kg) at graduation from cardiac rehab.  ? CBG concentrations in the normal range or as close to normal as is safely possible. Monitor and Evaluate progress toward nutrition goal with  team. Nutrition Risk: Change to Moderate Derek Mound, M.Ed, RD, LDN, CDE 12/10/2013 3:28 PM

## 2013-12-12 ENCOUNTER — Encounter (HOSPITAL_COMMUNITY): Payer: Medicare Other

## 2013-12-12 DIAGNOSIS — Z5189 Encounter for other specified aftercare: Secondary | ICD-10-CM | POA: Diagnosis not present

## 2013-12-14 ENCOUNTER — Encounter (HOSPITAL_COMMUNITY)
Admission: RE | Admit: 2013-12-14 | Discharge: 2013-12-14 | Disposition: A | Discharge status: Home / Self Care | Payer: Medicare Other | Source: Ambulatory Visit | Attending: Cardiovascular Disease | Admitting: Cardiovascular Disease

## 2013-12-14 DIAGNOSIS — Z5189 Encounter for other specified aftercare: Secondary | ICD-10-CM | POA: Diagnosis not present

## 2013-12-17 ENCOUNTER — Encounter (HOSPITAL_COMMUNITY)
Admission: RE | Admit: 2013-12-17 | Discharge: 2013-12-17 | Disposition: A | Discharge status: Home / Self Care | Payer: Medicare Other | Source: Ambulatory Visit | Attending: Cardiovascular Disease | Admitting: Cardiovascular Disease

## 2013-12-17 DIAGNOSIS — Z5189 Encounter for other specified aftercare: Secondary | ICD-10-CM | POA: Diagnosis not present

## 2013-12-18 ENCOUNTER — Ambulatory Visit (INDEPENDENT_AMBULATORY_CARE_PROVIDER_SITE_OTHER): Payer: Medicare Other | Admitting: Nurse Practitioner

## 2013-12-18 ENCOUNTER — Ambulatory Visit (INDEPENDENT_AMBULATORY_CARE_PROVIDER_SITE_OTHER): Payer: Medicare Other | Admitting: *Deleted

## 2013-12-18 ENCOUNTER — Encounter: Payer: Self-pay | Admitting: Nurse Practitioner

## 2013-12-18 VITALS — BP 110/80 | HR 80 | Ht 64.0 in | Wt 171.4 lb

## 2013-12-18 DIAGNOSIS — I10 Essential (primary) hypertension: Secondary | ICD-10-CM | POA: Diagnosis not present

## 2013-12-18 DIAGNOSIS — Z955 Presence of coronary angioplasty implant and graft: Secondary | ICD-10-CM | POA: Diagnosis not present

## 2013-12-18 DIAGNOSIS — I2510 Atherosclerotic heart disease of native coronary artery without angina pectoris: Secondary | ICD-10-CM | POA: Diagnosis not present

## 2013-12-18 DIAGNOSIS — Z23 Encounter for immunization: Secondary | ICD-10-CM

## 2013-12-18 LAB — BASIC METABOLIC PANEL
BUN: 24 mg/dL — ABNORMAL HIGH (ref 6–23)
CO2: 30 mEq/L (ref 19–32)
Calcium: 9.8 mg/dL (ref 8.4–10.5)
Chloride: 101 mEq/L (ref 96–112)
Creatinine, Ser: 1 mg/dL (ref 0.4–1.2)
GFR: 54.34 mL/min — ABNORMAL LOW (ref >60.00)
Glucose, Bld: 116 mg/dL — ABNORMAL HIGH (ref 70–99)
Potassium: 3.5 mEq/L (ref 3.5–5.1)
Sodium: 140 mEq/L (ref 135–145)

## 2013-12-18 MED ORDER — AMLODIPINE BESYLATE 5 MG PO TABS: 5.0000 mg | ORAL_TABLET | Freq: Every day | ORAL | Status: DC

## 2013-12-18 MED ORDER — POTASSIUM CHLORIDE CRYS ER 20 MEQ PO TBCR: 20.0000 meq | EXTENDED_RELEASE_TABLET | Freq: Two times a day (BID) | ORAL | Status: DC

## 2013-12-18 MED ORDER — CLOPIDOGREL BISULFATE 75 MG PO TABS: 75.0000 mg | ORAL_TABLET | Freq: Every day | ORAL | Status: DC

## 2013-12-18 NOTE — Patient Instructions (Signed)
We will be checking the following labs today BMET  Cut the Norvasc back to just 5 mg a day  See Dr. Recardo Evangelist as planned  Keep up the great work!!!  Call the [place] office at [phone] if you have any questions, problems or concerns.

## 2013-12-18 NOTE — Progress Notes (Signed)
Karen [name] Date of Birth: Oct 21, 1928 Medical Record [id number]  History of Present Illness: Karen [name] is seen back today for a 6 week check visit. Seen for Karen. Recardo Evangelist. She has a history of CAD, diastolic dysfunction, HTN and OA. Has had issues with DOE and was cathed in March of 2015 showing multivessel disease and was treated medically.   She failed on medical therapy - presented back to [facility] for PCI back in September. Had PCI to the LCX and LAD with DES. Committed to DAPT for one year.  Comes in today. Here alone. Doing very well. She is very pleased with how she is doing. Hoping to get off of medicines. BP running lower at rehab - has had a reading in the [id number] systolic and low [id number]. Enjoying cardiac rehab immensely. No chest pain. Not short of breath. Needs refills on meds to take to [facility].   Current Outpatient Prescriptions  Medication Sig Dispense Refill  . amLODipine (NORVASC) 10 MG tablet Take 1 tablet (10 mg total) by mouth daily. 90 tablet 3  . aspirin 81 MG tablet Take 1 tablet (81 mg total) by mouth daily. 90 tablet 3  . Calcium Carbonate-Vitamin D (CALCIUM 600+D) 600-400 MG-UNIT per tablet Take 1 tablet by mouth daily.    . clopidogrel (PLAVIX) 75 MG tablet Take 75 mg by mouth daily. Take 300 mg today ( 4 tablets ) then 75 mg daily    . furosemide (LASIX) 40 MG tablet Take 2 tablets (80 mg total) by mouth daily. 180 tablet 3  . meloxicam (MOBIC) 7.5 MG tablet Take 1 tablet (7.5 mg total) by mouth daily.    . metFORMIN (GLUCOPHAGE) 1000 MG tablet Take 1 tablet (1,000 mg total) by mouth 2 (two) times daily with a meal. Hold for 2 days, restart on 10/14/2013 60 tablet 0  . metoprolol succinate (TOPROL-XL) 50 MG 24 hr tablet Take 50 mg by mouth daily. Take with or immediately following a meal.    . mirtazapine (REMERON) 15 MG tablet Take 15 mg by mouth at bedtime.    . Multiple Vitamin (MULTIVITAMIN) tablet Take 1 tablet by mouth daily.    Karen [name]  Karen Glycol (SYSTANE OP) Place 1 drop into both eyes daily as needed (dry eyes).    . potassium chloride SA (K-DUR,KLOR-CON) 20 MEQ tablet Take 40 mEq by mouth 2 (two) times daily.    . QUEtiapine (SEROQUEL) 200 MG tablet Take 200 mg by mouth at bedtime.    . simvastatin (ZOCOR) 20 MG tablet Take 1 tablet (20 mg total) by mouth at bedtime. 90 tablet 3  . sitaGLIPtin (JANUVIA) 100 MG tablet Take 100 mg by mouth daily.    . valsartan (DIOVAN) 320 MG tablet Take 1 tablet (320 mg total) by mouth daily. 90 tablet 3   No current facility-administered medications for this visit.    Allergies  Allergen Reactions  . Lisinopril Cough  . Pneumococcal Vaccines Swelling and Other (See Comments)    Very bad pain and swelling in the arm of the shot, needed 2 cortisone shots.  . Codeine Nausea And Vomiting  . Morphine And Related Nausea And Vomiting  . Ultram [Tramadol Hcl] Itching    Past Medical History  Diagnosis Date  . Hypertension   . H/O: gout   . Fibromyalgia   . Constipation   . Hx: UTI (urinary tract infection)   . Arthritis   . Coronary artery disease 04/2013    Three-vessel disease, initial  medical therapy then stenting of the LAD and CFX 10/2013  . Diabetes mellitus     TYPE 2    Past Surgical History  Procedure Laterality Date  . Knee surgery  1997/2000  . Replacement total hip w/  resurfacing implants  [id number]  . Spinal fusion  2006  . Joint replacement  left hip-2007, left knee-2000, right knee-1997    Left Hip, Bilateral Knee replacements  . Back surgery  1972  . Closed reduction of left total hip  2008    x 3  . Right foot surgery  2004    x 2  . Appendectomy  1951  . Cholecystectomy  1951  . Hernia repair  1963  . Salivary gland stone extraction    . Cardiac catheterization  04/2013    Left main 30-40%, prox LAD 40%, mid/distal LAD 90%, CFX 70%, OM 3 90%, RCA 40-50%, PDA 20-80%, EF 50%   . Coronary stent placement  10/11/2013    2.25 x 20 mm Promus  DES to mid/distal LAD & 3.5 x 12 mm Promus DES Mid CX         Karen [name]    History  Smoking status  . Never Smoker   Smokeless tobacco  . Never Used    History  Alcohol Use No    Family History  Problem Relation Age of Onset  . Heart attack Father   . Stroke Father   . Cancer Mother   . Heart attack Brother   . Cancer Brother     Review of Systems: The review of systems is per the HPI.  All other systems were reviewed and are negative.  Physical Exam: BP 110/80 mmHg  Pulse 80  Ht 5\' 4"  (1.626 m)  Wt 171 lb 6.4 oz (77.747 kg)  BMI 29.41 kg/m2  SpO2 99%  LMP 05/10/1998 Patient is very pleasant and in no acute distress. Skin is warm and dry. Color is normal.  HEENT is unremarkable. Normocephalic/atraumatic. PERRL. Sclera are nonicteric. Neck is supple. No masses. No JVD. Lungs are clear. Cardiac exam shows a regular rate and rhythm. Abdomen is soft. Extremities are without edema. Gait and ROM are intact. No gross neurologic deficits noted.  Wt Readings from Last 3 Encounters:  12/18/13 171 lb 6.4 oz (77.747 kg)  12/04/13 171 lb (77.565 kg)  11/28/13 171 lb (77.565 kg)    LABORATORY DATA/PROCEDURES:  Lab Results  Component Value Date   WBC 8.6 11/05/2013   HGB 13.1 11/05/2013   HCT 38.7 11/05/2013   PLT 207.0 11/05/2013   GLUCOSE 125* 11/14/2013   ALT 18 07/05/2013   AST 20 07/05/2013   NA 138 11/14/2013   K 3.8 11/14/2013   CL 102 11/14/2013   CREATININE 1.0 11/14/2013   BUN 23 11/14/2013   CO2 27 11/14/2013   TSH 7.030* 05/16/2012   INR 0.99 10/09/2013    BNP (last 3 results)  Recent Labs  07/05/13 1658  PROBNP 229.60   CARDIAC CATH NOTE  Name: Karen [name] MRN: [id number] DOB: 1928-06-16  Procedure: PTCA and stenting of the mid to distal LAD, mid LCx  Indication: [name] with refractory dyspnea and fatigue despite optimal medical therapy. Diagnostic cath showed severe disease in the mid to distal LAD 90% and 80% stenosis in the mid  LCx.  Procedural Details: The right groin was prepped, draped, and anesthetized with 1% lidocaine. Using the modified Seldinger technique, a 6 Fr sheath was introduced into the right femoral  artery. Weight-based bivalirudin was given for anticoagulation. Once a therapeutic ACT was achieved, a 6 [place] XBLAD 3.5 guide catheter was inserted. A prowater coronary guidewire was used to cross the lesion in the LAD. The lesion was predilated with a 2.0 mm balloon. The lesion was then stented with a 2.25 x 20 mm Promus stent. The stent was postdilated with a 2.25 mm noncompliant balloon. Following PCI, there was 0% residual stenosis and TIMI-3 flow.  Next the lesion in the LCx was crossed with the prowater wire. The lesion was predilated with a 2.0 mm balloon. The lesion was then stented with a 3.5 x 12 mm Promus stent. The stent was postdilated with a 3.5 mm noncompliant balloon. Following PCI there was 0% residual stenosis and TIMI 3 flow. Final angiography confirmed an excellent result. The patient tolerated the procedure well. There were no immediate procedural complications. Femoral hemostasis was achieved with an Angioseal device. The patient was transferred to the post catheterization recovery area for further monitoring.  Lesion Data: Vessel: LAD- mid to distal Percent stenosis (pre): 90% TIMI-flow (pre): 3 Stent: 2.25 x 20 mm Promus Percent stenosis (post): 0% TIMI-flow (post): 3  Vessel #2 LCx-mid Percent stenosis pre- 80% TIMI flow Pre- 3 Stent: 3.5 x 12 mm Promus Percent stenosis-post- 0% TIMI flow post- 3  Conclusions:  1. Successful stenting of the mid to distal LAD with DES. 2. Successful stenting of the mid LCx with DES  Recommendations: Continue DAPT for one year. Anticipate DC in am.  Peter [name], [place]  10/11/2013, 1:00 PM    CARDIAC CATHETERIZATION REPORT  Procedures performed:  1. Left heart catheterization  2. Selective coronary angiography  3. Left  ventriculography  4. Abdominal aortic Angiogram  Reason for procedure:  Congestive heart failure  Procedure performed by: Sanda Klein, MD, [facility]  Complications: none   Estimated blood loss: less than 5 mL   History: Mrs. Carchi present with dyspnea and fatigue. Both her echocardiogram and her nuclear perfusion study showed mildly depressed left ventricle systolic function with an ejection fraction of around 45%. She does not have chest pain. The nuclear study did not show any evidence of regional abnormalities in perfusion. She has long-standing systemic hypertension, but until recently this was very well treated and was felt to be mild. She also has diabetes mellitus and hyperlipidemia, but has never smoked. There is a suspicion for multivessel CAD with "balanced ischemia".   Consent: The risks, benefits, and details of the procedure were explained to the patient. Risks including death, MI, stroke, bleeding, limb ischemia, renal failure and allergy were described and accepted by the patient. Informed written consent was obtained prior to proceeding.  Technique: The patient was brought to the cardiac catheterization laboratory in the fasting state. He was prepped and draped in the usual sterile fashion. Local anesthesia with 1% lidocaine was administered to the right wrist area. Using the modified Seldinger technique a 5 French right radial artery sheath was introduced without difficulty. The guide wire and catheter were advanced to the ascending aorta with slight difficulty due to a very tortuous right subclavian-innominate arterial course. Despite multiple attempts with a variety of catheters, the coronaries could not be engaged from the radial approach,so this course was abandoned. The right groin area had also been prepped and was anesthetized with 1% lidocaine. A 5 French right common femoral artery sheath was introduced without difficulty, using the modified Seldinger technique. Under  fluoroscopic guidance, using 5 [place] JL5, JR and angled pigtail catheters,  selective cannulation of the left coronary artery, right coronary artery and left ventricle were respectively performed. Several coronary angiograms in a variety of projections were recorded, as well as a left ventriculogram in the LAO projection, In order to outline her thoracic aortic aneurysm. Left ventricular pressure and a pull back to the aorta were recorded. No immediate complications occurred. At the end of the procedure, all catheters were removed. After the procedure, hemostasis will be achieved with manual pressure.  Contrast used: 150 mL Omnipaque Verapamil 5 mg nitroglycerin 200 mcg intra-arterially (radial sheath). Labetalol 30 mg and hydralazine 20 mg intravenously for hypertension. Lidocaine 1% 15 mL locally for anesthesia  Angiographic Findings:  1. The left main coronary artery bifurcates in the usual fashion into the left anterior descending artery and left circumflex coronary artery. It is calcified and exhibits moderate atherosclerosis with a roughly 30-40% distal stenosis 2. The left anterior descending artery is a large vessel that reaches the apex and generates 23major diagonal branches. There is evidence of Moderate luminal irregularities and Heavy calcification. There is a long segment of heavily calcified plaque in the proximal LAD artery generating a diffuse 40% stenosis. There is a 90% stenosis at the level of the second diagonal artery at the junction of the mid and distal LAD. 3. The left circumflex coronary artery is a Large-size vessel Non- dominant vessel that generates 3 major oblique marginal arteries, The proximal one being smaller. There is evidence of Extensive luminal irregularities and Heavy calcification.There is a 70% stenosis after the first oblique marginal artery. There is a 90% stenosis in the proximal third oblique marginal artery. 4. The right coronary artery is a Medium to  large-size dominant vessel that generates a Long posterior lateral ventricular system as well as the PDA. There is evidence of Extensive luminal irregularities and Heavy calcification. There is a 40-50% stenosis in the mid right coronary artery there is a 20-80% stenosis in the mid posterior descending artery. 5. The left ventricle is normal in size. The left ventricle systolic function is Mildly decreased with an estimated ejection fraction of 50%. Regional wall motion abnormalities are Not seen. No left ventricular thrombus is seen. There is No mitral insufficiency. The ascending aorta appears Calcified and mildly aneurysmal. There is no aortic valve stenosis by pullback. The left ventricular end-diastolic pressure is 30 mm Hg.   The descending thoracic aorta is tortuous, heavily calcified and moderately dilated. The abdominal aorta is normal in caliber and has diffuse atherosclerotic irregularitiesAnd calcification. There is no evidence of significant renal artery stenosis. There is an eccentric 30% stenosis in the mid right renal artery. The proximal portion of the right iliac artery is not well-visualized Secondary to shadowing from screws in the lumbar spine.   IMPRESSIONS:  Mrs. Liebert has three-vessel disease with "balanced ischemia". However the coronary lesions are fairly distal (roughly 2/3 of the way down the left anterior descending coronary artery, the distal oblique marginal artery and the posterior descending artery). Left ventricular systolic function is only minimally decreased. There is evidence of significant diastolic dysfunction. Her aortic calcification increases potential risk with coronary bypass surgery. RECOMMENDATION:  I believe this patient is best suited for medical therapy at this time. Her age and aortic disease place her at increased risk of complications with bypass surgery in the overall extent of myocardium in jeopardy is low. I do not think she would benefit from  percutaneous revascularization in the absence of angina pectoris. While surgical bypass could theoretically offer a survival  advantage, there is a nontrivial risk of serious complications. We'll discuss this with the patient and her family in detail and would also ask for an outpatient surgical opinion.     Sanda Klein, MD, [facility] [phone] office [phone] pager 04/25/2013    Echo Study Conclusions from February 2015  - Left ventricle: Septal and apical hypokinesis Moderate basal septal hypertrophy with sigmoid septum The cavity size was moderately dilated. Systolic function was mildly reduced. The estimated ejection fraction was in the range of 45% to 50%. - Aortic valve: Mild regurgitation. - Mitral valve: Calcified annulus. Mild regurgitation. - Left atrium: The atrium was moderately dilated. - Atrial septum: No defect or patent foramen ovale was identified.  Assessment / Plan:  1. CAD with recent 2 vessel PCI - on DAPT for one year minimum. She is doing very well. No change in her current regimen. She is seeing Karen. Recardo Evangelist in February.   2. HTN - BP is great - having some low readings - I have cut her Norvasc back to 5 mg a day. She will continue to monitor  3. HLD - On statin.   4. Hypokalemia - rechecking today. May need to adjust her dose.   I will be happy to see back if needed.Flu shot given today.   Patient is agreeable to this plan and will call if any problems develop in the interim.   Burtis Junes, RN, [facility] [address] [place], [place]  [address] [phone]

## 2013-12-19 ENCOUNTER — Encounter (HOSPITAL_COMMUNITY)
Admission: RE | Admit: 2013-12-19 | Discharge: 2013-12-19 | Disposition: A | Discharge status: Home / Self Care | Payer: Medicare Other | Source: Ambulatory Visit | Attending: Cardiovascular Disease | Admitting: Cardiovascular Disease

## 2013-12-19 DIAGNOSIS — Z5189 Encounter for other specified aftercare: Secondary | ICD-10-CM | POA: Diagnosis not present

## 2013-12-21 ENCOUNTER — Encounter (HOSPITAL_COMMUNITY)
Admission: RE | Admit: 2013-12-21 | Discharge: 2013-12-21 | Disposition: A | Discharge status: Home / Self Care | Payer: Medicare Other | Source: Ambulatory Visit | Attending: Cardiovascular Disease | Admitting: Cardiovascular Disease

## 2013-12-21 DIAGNOSIS — Z5189 Encounter for other specified aftercare: Secondary | ICD-10-CM | POA: Diagnosis not present

## 2013-12-24 ENCOUNTER — Encounter (HOSPITAL_COMMUNITY)
Admission: RE | Admit: 2013-12-24 | Discharge: 2013-12-24 | Disposition: A | Discharge status: Home / Self Care | Payer: Medicare Other | Source: Ambulatory Visit | Attending: Cardiovascular Disease | Admitting: Cardiovascular Disease

## 2013-12-24 DIAGNOSIS — Z5189 Encounter for other specified aftercare: Secondary | ICD-10-CM | POA: Diagnosis not present

## 2013-12-26 ENCOUNTER — Encounter (HOSPITAL_COMMUNITY)
Admission: RE | Admit: 2013-12-26 | Discharge: 2013-12-26 | Disposition: A | Discharge status: Home / Self Care | Payer: Medicare Other | Source: Ambulatory Visit | Attending: Cardiovascular Disease | Admitting: Cardiovascular Disease

## 2013-12-26 DIAGNOSIS — Z5189 Encounter for other specified aftercare: Secondary | ICD-10-CM | POA: Diagnosis not present

## 2013-12-28 ENCOUNTER — Encounter (HOSPITAL_COMMUNITY)
Admission: RE | Admit: 2013-12-28 | Discharge: 2013-12-28 | Disposition: A | Discharge status: Home / Self Care | Payer: Medicare Other | Source: Ambulatory Visit | Attending: Cardiovascular Disease | Admitting: Cardiovascular Disease

## 2013-12-28 DIAGNOSIS — Z5189 Encounter for other specified aftercare: Secondary | ICD-10-CM | POA: Diagnosis not present

## 2013-12-31 ENCOUNTER — Encounter (HOSPITAL_COMMUNITY)
Admission: RE | Admit: 2013-12-31 | Discharge: 2013-12-31 | Disposition: A | Discharge status: Home / Self Care | Payer: Medicare Other | Source: Ambulatory Visit | Attending: Cardiovascular Disease | Admitting: Cardiovascular Disease

## 2013-12-31 DIAGNOSIS — Z5189 Encounter for other specified aftercare: Secondary | ICD-10-CM | POA: Diagnosis not present

## 2014-01-02 ENCOUNTER — Encounter (HOSPITAL_COMMUNITY): Payer: Medicare Other

## 2014-01-07 ENCOUNTER — Encounter (HOSPITAL_COMMUNITY)
Admission: RE | Admit: 2014-01-07 | Discharge: 2014-01-07 | Disposition: A | Discharge status: Home / Self Care | Payer: Medicare Other | Source: Ambulatory Visit | Attending: Cardiovascular Disease | Admitting: Cardiovascular Disease

## 2014-01-07 DIAGNOSIS — Z5189 Encounter for other specified aftercare: Secondary | ICD-10-CM | POA: Diagnosis not present

## 2014-01-09 ENCOUNTER — Encounter (HOSPITAL_COMMUNITY)
Admission: RE | Admit: 2014-01-09 | Discharge: 2014-01-09 | Disposition: A | Discharge status: Home / Self Care | Payer: Medicare Other | Source: Ambulatory Visit | Attending: Cardiovascular Disease | Admitting: Cardiovascular Disease

## 2014-01-09 DIAGNOSIS — I2510 Atherosclerotic heart disease of native coronary artery without angina pectoris: Secondary | ICD-10-CM | POA: Diagnosis not present

## 2014-01-09 DIAGNOSIS — M129 Arthropathy, unspecified: Secondary | ICD-10-CM | POA: Insufficient documentation

## 2014-01-09 DIAGNOSIS — I5042 Chronic combined systolic (congestive) and diastolic (congestive) heart failure: Secondary | ICD-10-CM | POA: Insufficient documentation

## 2014-01-09 DIAGNOSIS — Z96659 Presence of unspecified artificial knee joint: Secondary | ICD-10-CM | POA: Insufficient documentation

## 2014-01-09 DIAGNOSIS — Z5189 Encounter for other specified aftercare: Secondary | ICD-10-CM | POA: Diagnosis not present

## 2014-01-09 DIAGNOSIS — I259 Chronic ischemic heart disease, unspecified: Secondary | ICD-10-CM | POA: Insufficient documentation

## 2014-01-09 DIAGNOSIS — I10 Essential (primary) hypertension: Secondary | ICD-10-CM | POA: Insufficient documentation

## 2014-01-09 DIAGNOSIS — E119 Type 2 diabetes mellitus without complications: Secondary | ICD-10-CM | POA: Insufficient documentation

## 2014-01-09 DIAGNOSIS — Z96649 Presence of unspecified artificial hip joint: Secondary | ICD-10-CM | POA: Diagnosis not present

## 2014-01-09 DIAGNOSIS — I209 Angina pectoris, unspecified: Secondary | ICD-10-CM | POA: Diagnosis not present

## 2014-01-09 DIAGNOSIS — Z955 Presence of coronary angioplasty implant and graft: Secondary | ICD-10-CM | POA: Insufficient documentation

## 2014-01-09 DIAGNOSIS — M797 Fibromyalgia: Secondary | ICD-10-CM | POA: Diagnosis not present

## 2014-01-09 NOTE — Progress Notes (Signed)
Pt c/o mild dyspnea on exertion today at cardaic rehab.  Symptoms relieved with rest.  02 sat-99%, lungs clear, no edema, wt stable. Pt states she has been very active today prior to rehab.  Pt denies rest symptoms.  Will continue to monitor. Pt advised when to contact MD and when to call 911.  Understanding verbalized

## 2014-01-11 ENCOUNTER — Encounter (HOSPITAL_COMMUNITY)
Admission: RE | Admit: 2014-01-11 | Discharge: 2014-01-11 | Disposition: A | Discharge status: Home / Self Care | Payer: Medicare Other | Source: Ambulatory Visit | Attending: Cardiovascular Disease | Admitting: Cardiovascular Disease

## 2014-01-11 DIAGNOSIS — Z5189 Encounter for other specified aftercare: Secondary | ICD-10-CM | POA: Diagnosis not present

## 2014-01-14 ENCOUNTER — Encounter (HOSPITAL_COMMUNITY)
Admission: RE | Admit: 2014-01-14 | Discharge: 2014-01-14 | Disposition: A | Discharge status: Home / Self Care | Payer: Medicare Other | Source: Ambulatory Visit | Attending: Cardiovascular Disease | Admitting: Cardiovascular Disease

## 2014-01-14 DIAGNOSIS — Z5189 Encounter for other specified aftercare: Secondary | ICD-10-CM | POA: Diagnosis not present

## 2014-01-16 ENCOUNTER — Encounter (HOSPITAL_COMMUNITY)
Admission: RE | Admit: 2014-01-16 | Discharge: 2014-01-16 | Disposition: A | Discharge status: Home / Self Care | Payer: Medicare Other | Source: Ambulatory Visit | Attending: Cardiovascular Disease | Admitting: Cardiovascular Disease

## 2014-01-16 DIAGNOSIS — Z5189 Encounter for other specified aftercare: Secondary | ICD-10-CM | POA: Diagnosis not present

## 2014-01-17 ENCOUNTER — Encounter (HOSPITAL_COMMUNITY): Payer: Self-pay | Admitting: Cardiovascular Disease

## 2014-01-17 DIAGNOSIS — I10 Essential (primary) hypertension: Secondary | ICD-10-CM | POA: Diagnosis not present

## 2014-01-17 DIAGNOSIS — E039 Hypothyroidism, unspecified: Secondary | ICD-10-CM | POA: Diagnosis not present

## 2014-01-17 DIAGNOSIS — E780 Pure hypercholesterolemia: Secondary | ICD-10-CM | POA: Diagnosis not present

## 2014-01-17 DIAGNOSIS — E1165 Type 2 diabetes mellitus with hyperglycemia: Secondary | ICD-10-CM | POA: Diagnosis not present

## 2014-01-18 ENCOUNTER — Encounter (HOSPITAL_COMMUNITY): Payer: Medicare Other

## 2014-01-21 ENCOUNTER — Encounter (HOSPITAL_COMMUNITY)
Admission: RE | Admit: 2014-01-21 | Discharge: 2014-01-21 | Disposition: A | Discharge status: Home / Self Care | Payer: Medicare Other | Source: Ambulatory Visit | Attending: Cardiovascular Disease | Admitting: Cardiovascular Disease

## 2014-01-21 DIAGNOSIS — Z5189 Encounter for other specified aftercare: Secondary | ICD-10-CM | POA: Diagnosis not present

## 2014-01-22 DIAGNOSIS — L720 Epidermal cyst: Secondary | ICD-10-CM | POA: Diagnosis not present

## 2014-01-22 DIAGNOSIS — L821 Other seborrheic keratosis: Secondary | ICD-10-CM | POA: Diagnosis not present

## 2014-01-22 DIAGNOSIS — Z86018 Personal history of other benign neoplasm: Secondary | ICD-10-CM | POA: Diagnosis not present

## 2014-01-22 DIAGNOSIS — L820 Inflamed seborrheic keratosis: Secondary | ICD-10-CM | POA: Diagnosis not present

## 2014-01-22 DIAGNOSIS — Z0000 Encounter for general adult medical examination without abnormal findings: Secondary | ICD-10-CM | POA: Diagnosis not present

## 2014-01-22 DIAGNOSIS — I10 Essential (primary) hypertension: Secondary | ICD-10-CM | POA: Diagnosis not present

## 2014-01-22 DIAGNOSIS — Z1389 Encounter for screening for other disorder: Secondary | ICD-10-CM | POA: Diagnosis not present

## 2014-01-22 DIAGNOSIS — E1165 Type 2 diabetes mellitus with hyperglycemia: Secondary | ICD-10-CM | POA: Diagnosis not present

## 2014-01-22 DIAGNOSIS — D239 Other benign neoplasm of skin, unspecified: Secondary | ICD-10-CM | POA: Diagnosis not present

## 2014-01-23 ENCOUNTER — Encounter: Payer: Self-pay | Admitting: Cardiovascular Disease

## 2014-01-23 ENCOUNTER — Encounter (HOSPITAL_COMMUNITY)
Admission: RE | Admit: 2014-01-23 | Discharge: 2014-01-23 | Disposition: A | Discharge status: Home / Self Care | Payer: Medicare Other | Source: Ambulatory Visit | Attending: Cardiovascular Disease | Admitting: Cardiovascular Disease

## 2014-01-23 DIAGNOSIS — Z5189 Encounter for other specified aftercare: Secondary | ICD-10-CM | POA: Diagnosis not present

## 2014-01-25 ENCOUNTER — Encounter (HOSPITAL_COMMUNITY)
Admission: RE | Admit: 2014-01-25 | Discharge: 2014-01-25 | Disposition: A | Discharge status: Home / Self Care | Payer: Medicare Other | Source: Ambulatory Visit | Attending: Cardiovascular Disease | Admitting: Cardiovascular Disease

## 2014-01-25 DIAGNOSIS — Z5189 Encounter for other specified aftercare: Secondary | ICD-10-CM | POA: Diagnosis not present

## 2014-01-28 ENCOUNTER — Encounter (HOSPITAL_COMMUNITY)
Admission: RE | Admit: 2014-01-28 | Discharge: 2014-01-28 | Disposition: A | Discharge status: Home / Self Care | Payer: Medicare Other | Source: Ambulatory Visit | Attending: Cardiovascular Disease | Admitting: Cardiovascular Disease

## 2014-01-28 DIAGNOSIS — Z5189 Encounter for other specified aftercare: Secondary | ICD-10-CM | POA: Diagnosis not present

## 2014-01-30 ENCOUNTER — Encounter (HOSPITAL_COMMUNITY): Payer: Medicare Other

## 2014-02-04 ENCOUNTER — Encounter (HOSPITAL_COMMUNITY)
Admission: RE | Admit: 2014-02-04 | Discharge: 2014-02-04 | Disposition: A | Discharge status: Home / Self Care | Payer: Medicare Other | Source: Ambulatory Visit | Attending: Cardiovascular Disease | Admitting: Cardiovascular Disease

## 2014-02-04 DIAGNOSIS — Z5189 Encounter for other specified aftercare: Secondary | ICD-10-CM | POA: Diagnosis not present

## 2014-02-06 ENCOUNTER — Encounter (HOSPITAL_COMMUNITY): Payer: Medicare Other

## 2014-02-08 ENCOUNTER — Encounter (HOSPITAL_COMMUNITY): Admission: RE | Admit: 2014-02-08 | Payer: Medicare Other | Source: Ambulatory Visit

## 2014-02-11 ENCOUNTER — Encounter (HOSPITAL_COMMUNITY)
Admission: RE | Admit: 2014-02-11 | Discharge: 2014-02-11 | Disposition: A | Discharge status: Home / Self Care | Payer: Medicare Other | Source: Ambulatory Visit | Attending: Cardiovascular Disease | Admitting: Cardiovascular Disease

## 2014-02-11 DIAGNOSIS — I5042 Chronic combined systolic (congestive) and diastolic (congestive) heart failure: Secondary | ICD-10-CM | POA: Insufficient documentation

## 2014-02-11 DIAGNOSIS — I10 Essential (primary) hypertension: Secondary | ICD-10-CM | POA: Diagnosis not present

## 2014-02-11 DIAGNOSIS — Z96649 Presence of unspecified artificial hip joint: Secondary | ICD-10-CM | POA: Diagnosis not present

## 2014-02-11 DIAGNOSIS — I209 Angina pectoris, unspecified: Secondary | ICD-10-CM | POA: Diagnosis not present

## 2014-02-11 DIAGNOSIS — I2510 Atherosclerotic heart disease of native coronary artery without angina pectoris: Secondary | ICD-10-CM | POA: Insufficient documentation

## 2014-02-11 DIAGNOSIS — E119 Type 2 diabetes mellitus without complications: Secondary | ICD-10-CM | POA: Diagnosis not present

## 2014-02-11 DIAGNOSIS — I259 Chronic ischemic heart disease, unspecified: Secondary | ICD-10-CM | POA: Diagnosis not present

## 2014-02-11 DIAGNOSIS — Z955 Presence of coronary angioplasty implant and graft: Secondary | ICD-10-CM | POA: Insufficient documentation

## 2014-02-11 DIAGNOSIS — Z5189 Encounter for other specified aftercare: Secondary | ICD-10-CM | POA: Diagnosis not present

## 2014-02-11 DIAGNOSIS — M129 Arthropathy, unspecified: Secondary | ICD-10-CM | POA: Insufficient documentation

## 2014-02-11 DIAGNOSIS — M797 Fibromyalgia: Secondary | ICD-10-CM | POA: Insufficient documentation

## 2014-02-11 DIAGNOSIS — Z96659 Presence of unspecified artificial knee joint: Secondary | ICD-10-CM | POA: Insufficient documentation

## 2014-02-13 ENCOUNTER — Encounter (HOSPITAL_COMMUNITY)
Admission: RE | Admit: 2014-02-13 | Discharge: 2014-02-13 | Disposition: A | Discharge status: Home / Self Care | Payer: Medicare Other | Source: Ambulatory Visit | Attending: Cardiovascular Disease | Admitting: Cardiovascular Disease

## 2014-02-13 DIAGNOSIS — I259 Chronic ischemic heart disease, unspecified: Secondary | ICD-10-CM | POA: Diagnosis not present

## 2014-02-13 DIAGNOSIS — Z955 Presence of coronary angioplasty implant and graft: Secondary | ICD-10-CM | POA: Diagnosis not present

## 2014-02-13 DIAGNOSIS — Z5189 Encounter for other specified aftercare: Secondary | ICD-10-CM | POA: Diagnosis not present

## 2014-02-13 DIAGNOSIS — I209 Angina pectoris, unspecified: Secondary | ICD-10-CM | POA: Diagnosis not present

## 2014-02-13 DIAGNOSIS — I10 Essential (primary) hypertension: Secondary | ICD-10-CM | POA: Diagnosis not present

## 2014-02-13 DIAGNOSIS — I2510 Atherosclerotic heart disease of native coronary artery without angina pectoris: Secondary | ICD-10-CM | POA: Diagnosis not present

## 2014-02-15 ENCOUNTER — Encounter (HOSPITAL_COMMUNITY): Payer: Self-pay

## 2014-02-15 ENCOUNTER — Encounter (HOSPITAL_COMMUNITY)
Admission: RE | Admit: 2014-02-15 | Discharge: 2014-02-15 | Disposition: A | Discharge status: Home / Self Care | Payer: Medicare Other | Source: Ambulatory Visit | Attending: Cardiovascular Disease | Admitting: Cardiovascular Disease

## 2014-02-15 DIAGNOSIS — I209 Angina pectoris, unspecified: Secondary | ICD-10-CM | POA: Diagnosis not present

## 2014-02-15 DIAGNOSIS — I10 Essential (primary) hypertension: Secondary | ICD-10-CM | POA: Diagnosis not present

## 2014-02-15 DIAGNOSIS — Z5189 Encounter for other specified aftercare: Secondary | ICD-10-CM | POA: Diagnosis not present

## 2014-02-15 DIAGNOSIS — Z955 Presence of coronary angioplasty implant and graft: Secondary | ICD-10-CM | POA: Diagnosis not present

## 2014-02-15 DIAGNOSIS — I2510 Atherosclerotic heart disease of native coronary artery without angina pectoris: Secondary | ICD-10-CM | POA: Diagnosis not present

## 2014-02-15 DIAGNOSIS — I259 Chronic ischemic heart disease, unspecified: Secondary | ICD-10-CM | POA: Diagnosis not present

## 2014-02-15 NOTE — Progress Notes (Signed)
Pt graduated from cardiac rehab program today with completion of 36 exercise sessions in Phase II. Pt maintained good attendance and progressed nicely during her  participation in rehab as evidenced by increased MET level.   Medication list reconciled. Repeat  PHQ score-0  .  Pt has made significant lifestyle changes and should be commended for her success. Pt feels she has achieved her goals during cardiac rehab, specifically weight loss.  Pt plans to continue losing weight with goal of 10 lb weight loss in next 2 months.  Pt proud that she has lowered her blood pressure, improved glycemic index with improved strength and stamina.     Pt plans to continue exercising on her own at home with recumbent bike.  Pt has supportive family, lives with her adult son.  Pt enjoys caring for herself, cooking,cleaning, participating in church activities. Pt had excellent participation in education classes with good verbalized retention of materials learned.

## 2014-04-04 ENCOUNTER — Encounter: Payer: Self-pay | Admitting: Cardiovascular Disease

## 2014-04-04 ENCOUNTER — Ambulatory Visit (INDEPENDENT_AMBULATORY_CARE_PROVIDER_SITE_OTHER): Payer: Medicare Other | Admitting: Cardiovascular Disease

## 2014-04-04 VITALS — BP 136/87 | HR 78 | Resp 16 | Ht 64.0 in | Wt 172.7 lb

## 2014-04-04 DIAGNOSIS — E785 Hyperlipidemia, unspecified: Secondary | ICD-10-CM | POA: Diagnosis not present

## 2014-04-04 DIAGNOSIS — I2510 Atherosclerotic heart disease of native coronary artery without angina pectoris: Secondary | ICD-10-CM

## 2014-04-04 DIAGNOSIS — I712 Thoracic aortic aneurysm, without rupture, unspecified: Secondary | ICD-10-CM

## 2014-04-04 DIAGNOSIS — I10 Essential (primary) hypertension: Secondary | ICD-10-CM

## 2014-04-04 DIAGNOSIS — I255 Ischemic cardiomyopathy: Secondary | ICD-10-CM

## 2014-04-04 DIAGNOSIS — I719 Aortic aneurysm of unspecified site, without rupture: Secondary | ICD-10-CM | POA: Diagnosis not present

## 2014-04-04 NOTE — Patient Instructions (Signed)
CT ANGIO CHEST:  Non-Cardiac CT scanning, (CAT scanning), is a noninvasive, special x-ray that produces cross-sectional images of the body using x-rays and a computer. CT scans help physicians diagnose and treat medical conditions. For some CT exams, a contrast material is used to enhance visibility in the area of the body being studied. CT scans provide greater clarity and reveal more details than regular x-ray exams.  Dr. Sallyanne Kuster recommends that you schedule a follow-up appointment in: 6 MONTHS

## 2014-04-05 ENCOUNTER — Encounter: Payer: Self-pay | Admitting: Cardiovascular Disease

## 2014-04-05 NOTE — Progress Notes (Signed)
Patient ID: Karen [name], female   DOB: [birth year], 81 y.o.   MRN: [id number]     Reason for office visit CAD, diastolic HF, HTN  She feels so much better following her stent procedures. She really felt that she benefited from cardiac rehab, both physically and emotionally.  On October 11 2013 she had 2.25 x 20 mm Promus to 90% LAD- mid to distal and 3.5 x 12 mm Promus to 80% LCx-mid lesions. She had presented with profound fatigue and exertional dyspnea, but without angina. Symptoms did not resolve despite aggressive antiHTNive and antianginal pharmacological therapy. LVEF was 50%.  She has treated HTN and hyperlipidemia and mild DM. She had an incidentally discovered small aneurysm of the ascending aorta (43 mm, last evaluated December 2014).   Allergies  Allergen Reactions  . Lisinopril Cough  . Pneumococcal Vaccines Swelling and Other (See Comments)    Very bad pain and swelling in the arm of the shot, needed 2 cortisone shots.  . Codeine Nausea And Vomiting  . Morphine And Related Nausea And Vomiting  . Ultram [Tramadol Hcl] Itching    Current Outpatient Prescriptions  Medication Sig Dispense Refill  . amLODipine (NORVASC) 5 MG tablet Take 1 tablet (5 mg total) by mouth daily. 90 tablet 3  . aspirin 81 MG tablet Take 1 tablet (81 mg total) by mouth daily. 90 tablet 3  . Calcium Carbonate-Vitamin D (CALCIUM 600+D) 600-400 MG-UNIT per tablet Take 1 tablet by mouth daily.    . clopidogrel (PLAVIX) 75 MG tablet Take 1 tablet (75 mg total) by mouth daily. 90 tablet 3  . furosemide (LASIX) 40 MG tablet Take 2 tablets (80 mg total) by mouth daily. 180 tablet 3  . meloxicam (MOBIC) 7.5 MG tablet Take 1 tablet (7.5 mg total) by mouth daily.    . metFORMIN (GLUCOPHAGE) 1000 MG tablet Take 1 tablet (1,000 mg total) by mouth 2 (two) times daily with a meal. Hold for 2 days, restart on 10/14/2013 60 tablet 0  . metoprolol succinate (TOPROL-XL) 50 MG 24 hr tablet Take 50 mg by mouth  daily. Take with or immediately following a meal.    . mirtazapine (REMERON) 15 MG tablet Take 15 mg by mouth at bedtime.    . Multiple Vitamin (MULTIVITAMIN) tablet Take 1 tablet by mouth daily.    Vladimir Faster Glycol-Propyl Glycol (SYSTANE OP) Place 1 drop into both eyes daily as needed (dry eyes).    . potassium chloride SA (K-DUR,KLOR-CON) 20 MEQ tablet Take 1 tablet (20 mEq total) by mouth 2 (two) times daily. 180 tablet 3  . QUEtiapine (SEROQUEL) 200 MG tablet Take 200 mg by mouth at bedtime.    . simvastatin (ZOCOR) 20 MG tablet Take 1 tablet (20 mg total) by mouth at bedtime. 90 tablet 3  . sitaGLIPtin (JANUVIA) 100 MG tablet Take 100 mg by mouth daily.    . valsartan (DIOVAN) 320 MG tablet Take 1 tablet (320 mg total) by mouth daily. 90 tablet 3   No current facility-administered medications for this visit.    Past Medical History  Diagnosis Date  . Hypertension   . H/O: gout   . Fibromyalgia   . Constipation   . Hx: UTI (urinary tract infection)   . Arthritis   . Coronary artery disease 04/2013    Three-vessel disease, initial medical therapy then stenting of the LAD and CFX 10/2013  . Diabetes mellitus     TYPE 2    Past Surgical History  Procedure Laterality Date  . Knee surgery  1997/2000  . Replacement total hip w/  resurfacing implants  [id number]  . Spinal fusion  2006  . Joint replacement  left hip-2007, left knee-2000, right knee-1997    Left Hip, Bilateral Knee replacements  . Back surgery  1972  . Closed reduction of left total hip  2008    x 3  . Right foot surgery  2004    x 2  . Appendectomy  1951  . Cholecystectomy  1951  . Hernia repair  1963  . Salivary gland stone extraction    . Cardiac catheterization  04/2013    Left main 30-40%, prox LAD 40%, mid/distal LAD 90%, CFX 70%, OM 3 90%, RCA 40-50%, PDA 20-80%, EF 50%   . Coronary stent placement  10/11/2013    2.25 x 20 mm Promus DES to mid/distal LAD & 3.5 x 12 mm Promus DES Mid CX         DR  Martinique  . Left heart catheterization with coronary angiogram N/A 04/25/2013    Procedure: LEFT HEART CATHETERIZATION WITH CORONARY ANGIOGRAM;  Surgeon: Sanda Klein, MD;  Location: [facility] [place] [facility] CATH LAB;  Service: Cardiovascular;  Laterality: N/A;  . Percutaneous coronary stent intervention (pci-s) N/A 10/11/2013    Procedure: PERCUTANEOUS CORONARY STENT INTERVENTION (PCI-S);  Surgeon: Peter M Martinique, MD;  Location: [facility] CATH LAB;  Service: Cardiovascular;  Laterality: N/A;    Family History  Problem Relation Age of Onset  . Heart attack Father   . Stroke Father   . Cancer Mother   . Heart attack Brother   . Cancer Brother     History   Social History  . Marital Status: Widowed    Spouse Name: N/A  . Number of Children: N/A  . Years of Education: N/A   Occupational History  . Not on file.   Social History Main Topics  . Smoking status: Never Smoker   . Smokeless tobacco: Never Used  . Alcohol Use: No  . Drug Use: No  . Sexual Activity: No   Other Topics Concern  . Not on file   Social History Narrative    Review of systems: The patient specifically denies any chest pain at rest or with exertion, dyspnea at rest or with exertion, orthopnea, paroxysmal nocturnal dyspnea, syncope, palpitations, focal neurological deficits, intermittent claudication, lower extremity edema, unexplained weight gain, cough, hemoptysis or wheezing.  The patient also denies abdominal pain, nausea, vomiting, dysphagia, diarrhea, constipation, polyuria, polydipsia, dysuria, hematuria, frequency, urgency, abnormal bleeding or bruising, fever, chills, unexpected weight changes, mood swings, change in skin or hair texture, change in voice quality, auditory or visual problems, allergic reactions or rashes, new musculoskeletal complaints other than usual "aches and pains".   PHYSICAL EXAM BP 156/80 mmHg  Pulse 78  Ht 5\' 4"  (1.626 m)  Wt 172 lb 11.2 oz (78.336 kg)  BMI 29.63 kg/m2  LMP 05/10/1998  General:  Alert, oriented x3, no distress Head: no evidence of trauma, PERRL, EOMI, no exophtalmos or lid lag, no myxedema, no xanthelasma; normal ears, nose and oropharynx Neck: normal jugular venous pulsations and no hepatojugular reflux; brisk carotid pulses without delay and no carotid bruits Chest: clear to auscultation, no signs of consolidation by percussion or palpation, normal fremitus, symmetrical and full respiratory excursions Cardiovascular: normal position and quality of the apical impulse, regular rhythm, normal first and second heart sounds, no murmurs, rubs or gallops Abdomen: no tenderness or distention, no masses by palpation, no abnormal  pulsatility or arterial bruits, normal bowel sounds, no hepatosplenomegaly Extremities: no clubbing, cyanosis or edema; 2+ radial, ulnar and brachial pulses bilaterally; 2+ right femoral, posterior tibial and dorsalis pedis pulses; 2+ left femoral, posterior tibial and dorsalis pedis pulses; no subclavian or femoral bruits Neurological: grossly nonfocal   EKG: NSR  Lipid Panel  01/17/2014 Total chol 191, TG 134, HDL 59, LDL 105  BMET    Component Value Date/Time   NA 140 12/18/2013 1521   K 3.5 12/18/2013 1521   CL 101 12/18/2013 1521   CO2 30 12/18/2013 1521   GLUCOSE 116* 12/18/2013 1521   BUN 24* 12/18/2013 1521   CREATININE 1.0 12/18/2013 1521   CREATININE 1.05 10/09/2013 1246   CALCIUM 9.8 12/18/2013 1521   GFRNONAA 76* 10/12/2013 0234   GFRAA 88* 10/12/2013 0234     ASSESSMENT AND PLAN Chronic combined systolic and diastolic congestive heart failure  NYHA class II. no overt physical signs of hypervolemia today  Cardiomyopathy, mixed hypertensive and ischemic etiology  EF 45-50%, diffuse hypokinesis. Suspect that we will see improvement now that BP has been well controlled and she has had revascularization. On ARB and beta blocker.  HTN (hypertension)  Now well controlled  Diabetes mellitus   Hyperlipidemia  On statin  therapy. Ideally would like LDL<100, preferably <70. Would not increase simvastatin doe further due to amlodipine interaction. Consider switching to atorvastatin 40 mg daily.  CAD (coronary artery disease)  S/P DES of LCX  and LAD . Continue dual antiplatelet therapy until September.  Aneurysm of ascending aorta Reevaluate by CTA yearly.  Orders Placed This Encounter  Procedures  . CT Chest W Contrast  . EKG 12-Lead   No orders of the defined types were placed in this encounter.    Holli Humbles, MD, [place] [phone] office [phone] pager

## 2014-04-09 ENCOUNTER — Telehealth: Payer: Self-pay | Admitting: *Deleted

## 2014-04-09 MED ORDER — ATORVASTATIN CALCIUM 40 MG PO TABS: 40.0000 mg | ORAL_TABLET | Freq: Every day | ORAL | Status: DC

## 2014-04-09 NOTE — Telephone Encounter (Signed)
-----   Message from Sanda Klein, MD sent at 04/05/2014  6:45 PM EST ----- Cholesterol values not at target yet. If she agrees would like to switch to atorvastatin 40 mg daily. Cannot increase simvastatin higher due to drug interaction with her BP meds

## 2014-04-23 DIAGNOSIS — M5136 Other intervertebral disc degeneration, lumbar region: Secondary | ICD-10-CM | POA: Diagnosis not present

## 2014-04-23 DIAGNOSIS — M150 Primary generalized (osteo)arthritis: Secondary | ICD-10-CM | POA: Diagnosis not present

## 2014-04-23 DIAGNOSIS — M797 Fibromyalgia: Secondary | ICD-10-CM | POA: Diagnosis not present

## 2014-05-13 DIAGNOSIS — Z794 Long term (current) use of insulin: Secondary | ICD-10-CM | POA: Diagnosis not present

## 2014-05-13 DIAGNOSIS — E119 Type 2 diabetes mellitus without complications: Secondary | ICD-10-CM | POA: Diagnosis not present

## 2014-05-13 DIAGNOSIS — H5213 Myopia, bilateral: Secondary | ICD-10-CM | POA: Diagnosis not present

## 2014-05-13 DIAGNOSIS — H52223 Regular astigmatism, bilateral: Secondary | ICD-10-CM | POA: Diagnosis not present

## 2014-05-24 DIAGNOSIS — E559 Vitamin D deficiency, unspecified: Secondary | ICD-10-CM | POA: Diagnosis not present

## 2014-05-24 DIAGNOSIS — E1165 Type 2 diabetes mellitus with hyperglycemia: Secondary | ICD-10-CM | POA: Diagnosis not present

## 2014-05-24 DIAGNOSIS — E780 Pure hypercholesterolemia: Secondary | ICD-10-CM | POA: Diagnosis not present

## 2014-05-24 DIAGNOSIS — I10 Essential (primary) hypertension: Secondary | ICD-10-CM | POA: Diagnosis not present

## 2014-05-28 ENCOUNTER — Encounter: Payer: Self-pay | Admitting: Cardiovascular Disease

## 2014-05-28 DIAGNOSIS — E1165 Type 2 diabetes mellitus with hyperglycemia: Secondary | ICD-10-CM | POA: Diagnosis not present

## 2014-05-28 DIAGNOSIS — F5101 Primary insomnia: Secondary | ICD-10-CM | POA: Diagnosis not present

## 2014-05-28 DIAGNOSIS — E780 Pure hypercholesterolemia: Secondary | ICD-10-CM | POA: Diagnosis not present

## 2014-05-28 DIAGNOSIS — Z23 Encounter for immunization: Secondary | ICD-10-CM | POA: Diagnosis not present

## 2014-05-28 DIAGNOSIS — I10 Essential (primary) hypertension: Secondary | ICD-10-CM | POA: Diagnosis not present

## 2014-05-29 ENCOUNTER — Telehealth: Payer: Self-pay | Admitting: Cardiovascular Disease

## 2014-05-29 NOTE — Telephone Encounter (Signed)
Pt called in stating that Dr. Minna Antis is wanting her to hold her Metformin prior to having her CT done. She states that a fax was sent over to our office requesting this and wanted to know if Dr. Loletha Grayer received this. Please call  Thanks

## 2014-05-29 NOTE — Telephone Encounter (Signed)
PCP did pre-CT labs and labs were faxed to Dr. Victorino December office and are scanned into [facility] and patient was notified of results.  Patient is wondering if she should hold metformin - her PCP told her she may need to and that [facility] told her not to. She will take her medications as prescribed on the day of her CT test.

## 2014-06-10 ENCOUNTER — Ambulatory Visit
Admission: RE | Admit: 2014-06-10 | Discharge: 2014-06-10 | Disposition: A | Discharge status: Home / Self Care | Payer: Medicare Other | Source: Ambulatory Visit | Attending: Cardiovascular Disease | Admitting: Cardiovascular Disease

## 2014-06-10 ENCOUNTER — Other Ambulatory Visit: Payer: Self-pay | Admitting: Cardiovascular Disease

## 2014-06-10 ENCOUNTER — Telehealth: Payer: Self-pay | Admitting: Cardiovascular Disease

## 2014-06-10 DIAGNOSIS — I712 Thoracic aortic aneurysm, without rupture, unspecified: Secondary | ICD-10-CM

## 2014-06-10 DIAGNOSIS — J984 Other disorders of lung: Secondary | ICD-10-CM | POA: Diagnosis not present

## 2014-06-10 MED ORDER — IOPAMIDOL (ISOVUE-370) INJECTION 76%
50.0000 mL | Freq: Once | INTRAVENOUS | Status: AC | PRN
  Administered 2014-06-10: 50 mL via INTRAVENOUS

## 2014-06-10 MED ORDER — IOPAMIDOL (ISOVUE-370) INJECTION 76%: 75.0000 mL | Freq: Once | INTRAVENOUS | Status: DC | PRN

## 2014-06-10 NOTE — Telephone Encounter (Signed)
[facility] - called requesting verbal OK to change CT chest order to CT Angio.

## 2014-08-19 IMAGING — CT CT HEAD W/O CM
2 series · 16 of 30 positions shown, 20 images · non-contrast
Comparison: 10/19/2011.

CLINICAL DATA: Altered mental status.  Diabetic hypertensive
patient.

CT HEAD WITHOUT CONTRAST
TECHNIQUE: Contiguous axial images were obtained from the base of
the skull through the vertex without contrast.

[Series 2: head w/o · axial · non-contrast · 0.43mm/px · z∈[-21,+99]mm · 13 of 28 slices shown, 17 images]
[im 2/28  brain]
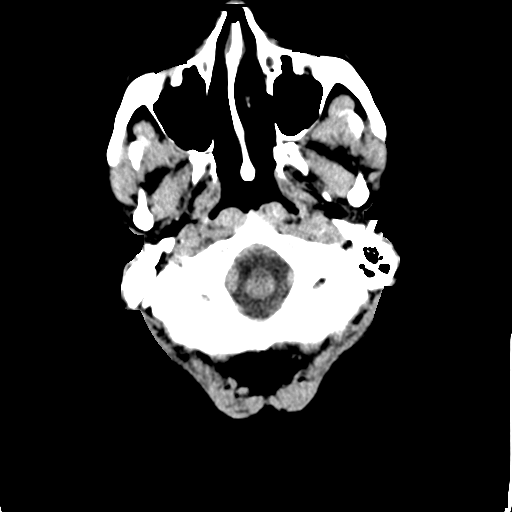
[im 2/28  bone]
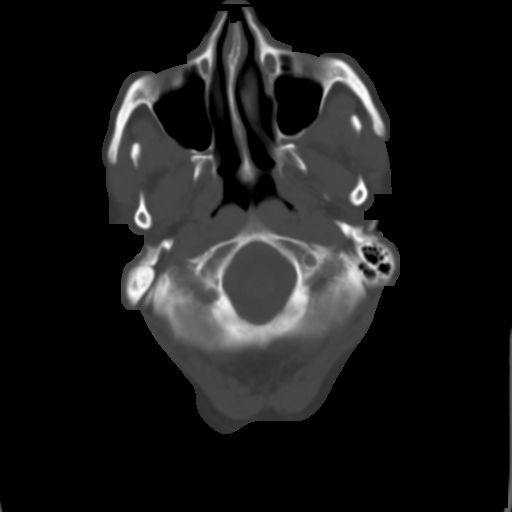
[im 4/28  brain]
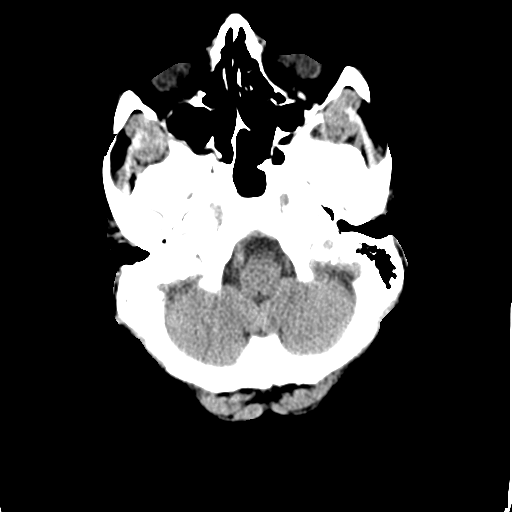
[im 6/28  brain]
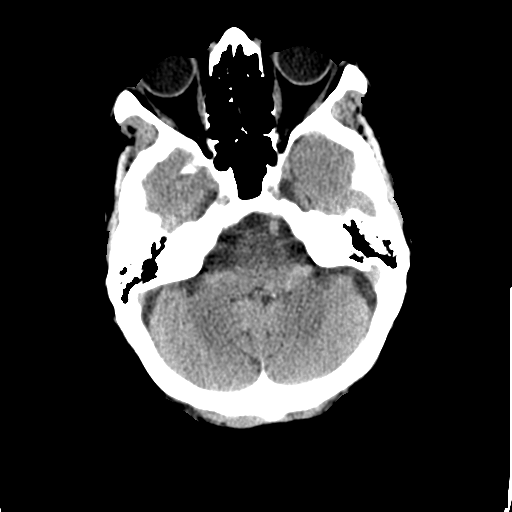
[im 8/28  brain]
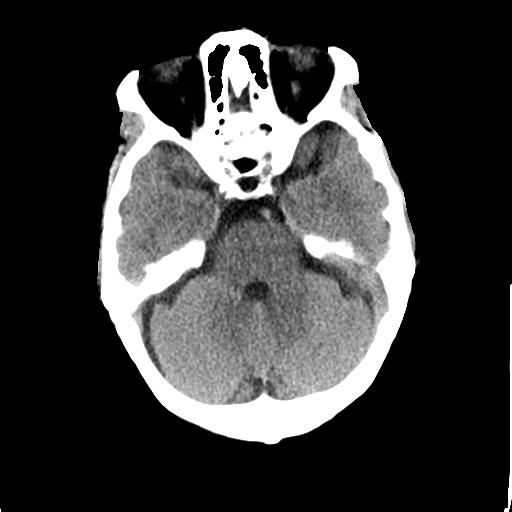
[im 10/28  brain]
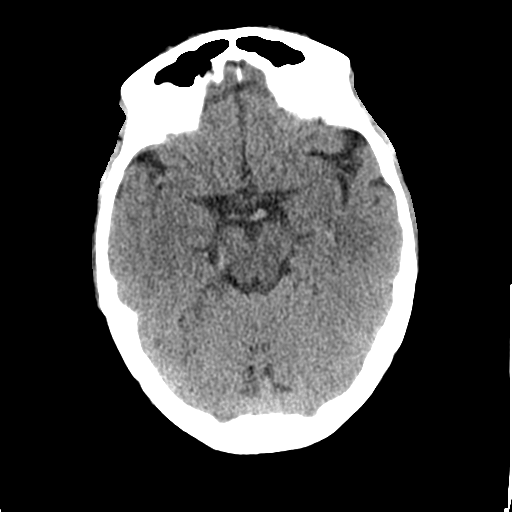
[im 10/28  bone]
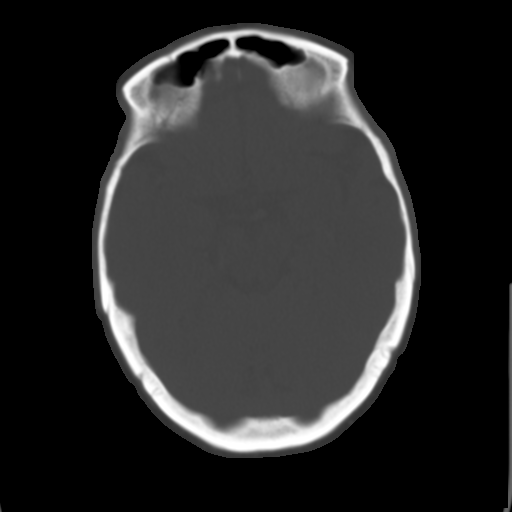
[im 12/28  brain]
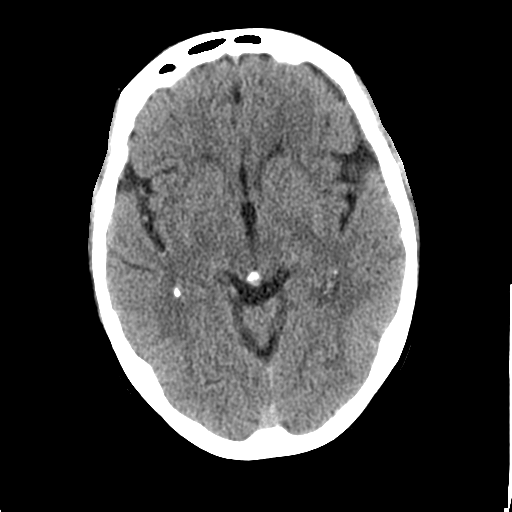
[im 14/28  brain]
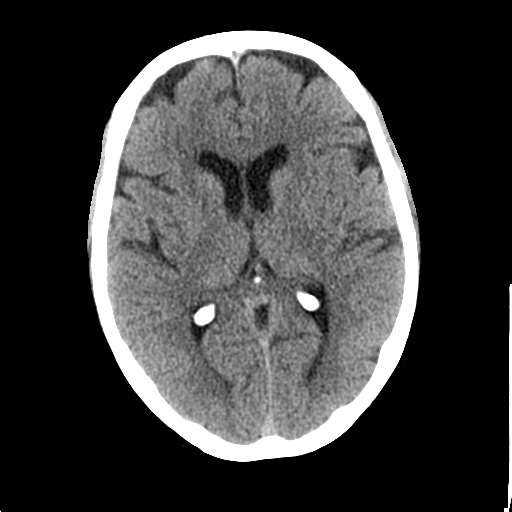
[im 16/28  brain]
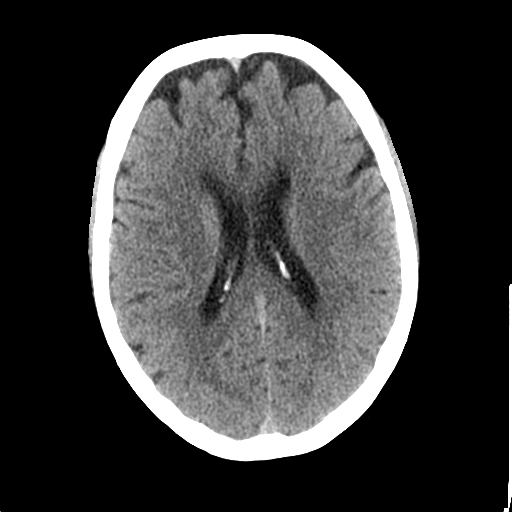
[im 18/28  brain]
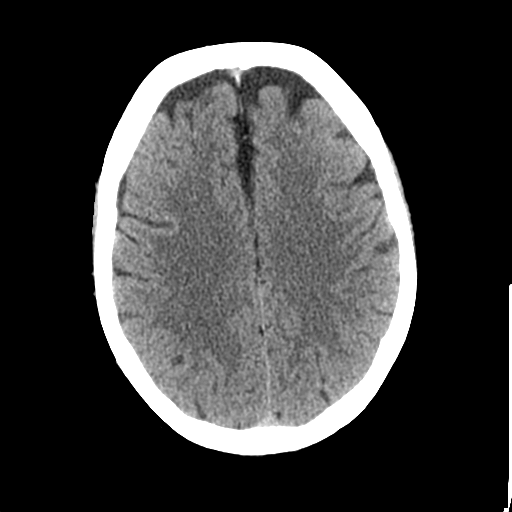
[im 18/28  bone]
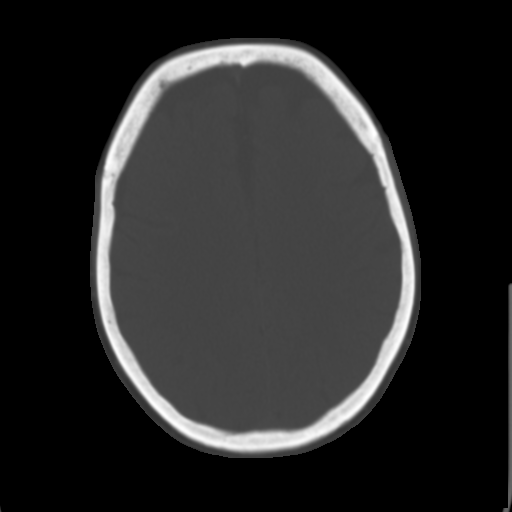
[im 20/28  brain]
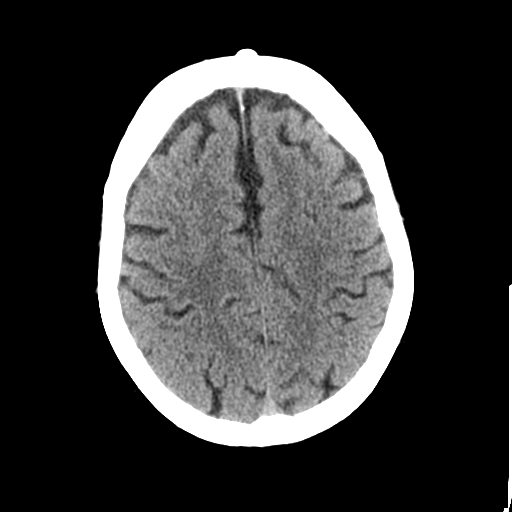
[im 22/28  brain]
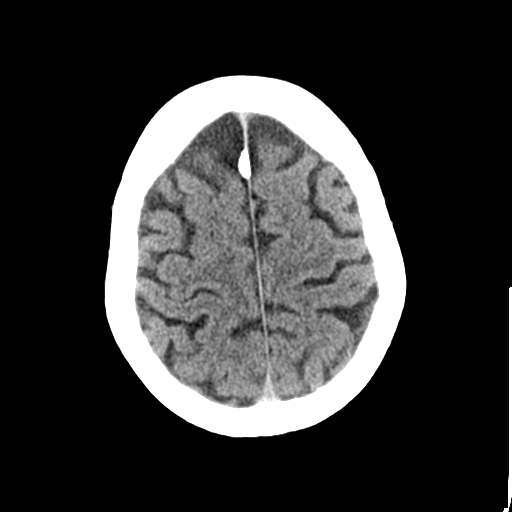
[im 24/28  brain]
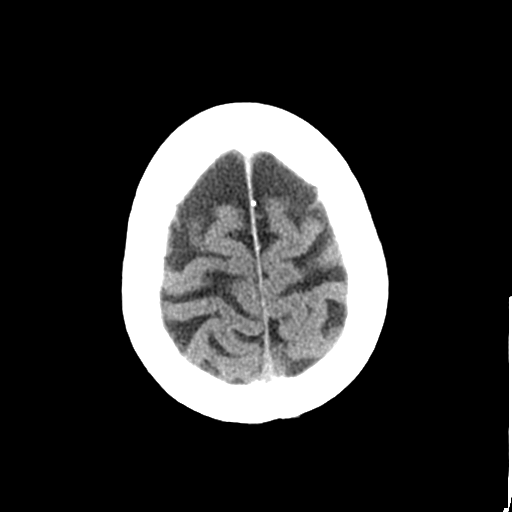
[im 26/28  brain]
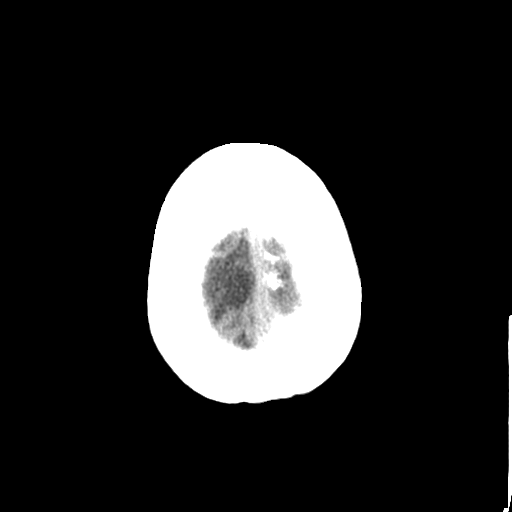
[im 26/28  bone]
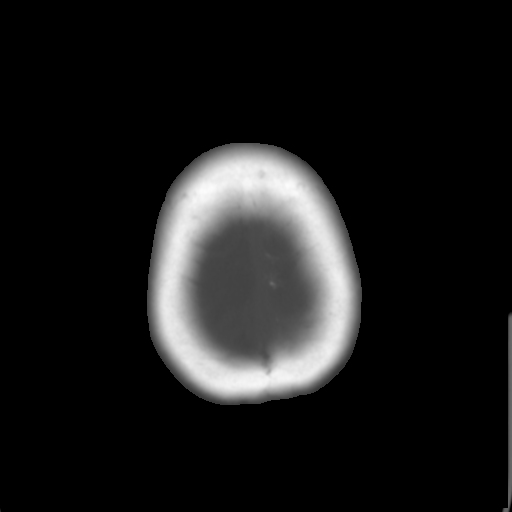

[Series 3: bone windows · axial · 0.43mm/px · z∈[-21,+19]mm · 3 of 28 slices shown]
[im 2/28  bone]
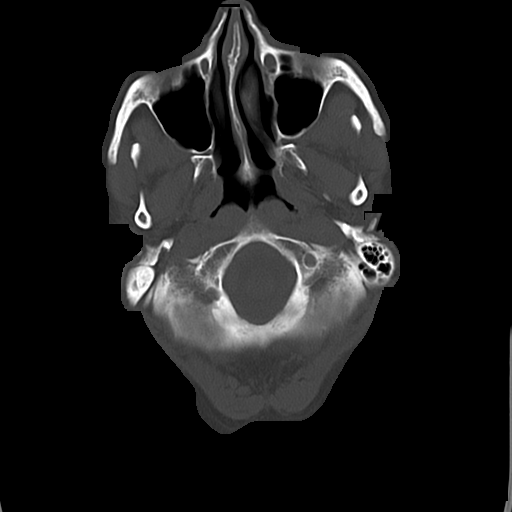
[im 6/28  bone]
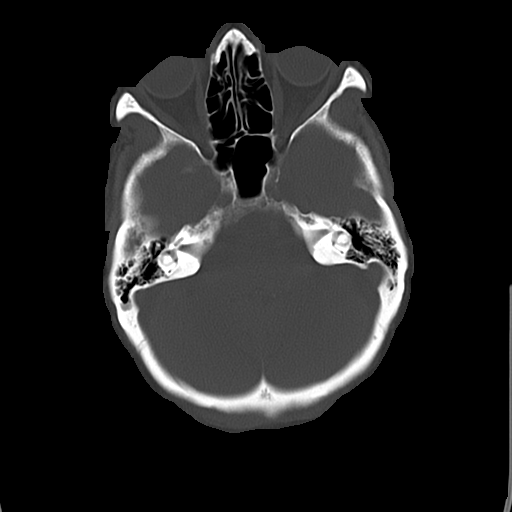
[im 10/28  bone]
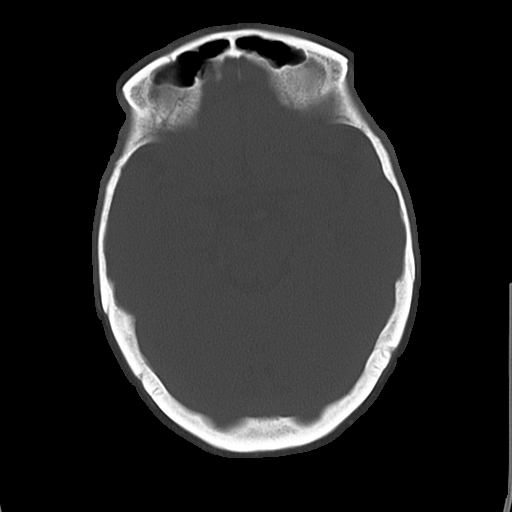

[16 of 30 positions shown; findings below may reference images not displayed]

FINDINGS: No intracranial hemorrhage.

No CT evidence of large acute infarct.

Global atrophy without hydrocephalus.

No intracranial mass lesion detected on this unenhanced exam..

Vascular calcifications.

Small left frontal calvarial anteriorly directed exostosis
unchanged.

Partial opacification inferior aspect right mastoid air cells.

Visualized orbital structures unremarkable.
IMPRESSION: No intracranial hemorrhage or CT evidence of large acute infarct.
Please see above.

## 2014-08-19 IMAGING — CT CT ANGIO CHEST
2 of 3 series · 18 of 32 positions shown · IV contrast (OMNIPAQUE 350)
Comparison: Multiple exams, including 05/14/2012

CLINICAL DATA: Altered mental status.  Back pain.  Tortuous and
ectatic thoracic aorta.  Palpitations.

CT ANGIOGRAPHY CHEST
TECHNIQUE: Multidetector CT imaging of the chest using the
standard protocol during bolus administration of intravenous
contrast. Multiplanar reconstructed images including MIPs were
obtained and reviewed to evaluate the vascular anatomy.
Contrast: 100mL OMNIPAQUE IOHEXOL 350 MG/ML SOLN

[Series 7: arterial 3.0 b30f · axial · arterial · 0.72mm/px · z∈[-284,-44]mm · 13 of 94 slices shown]
[im 7/94  lung]
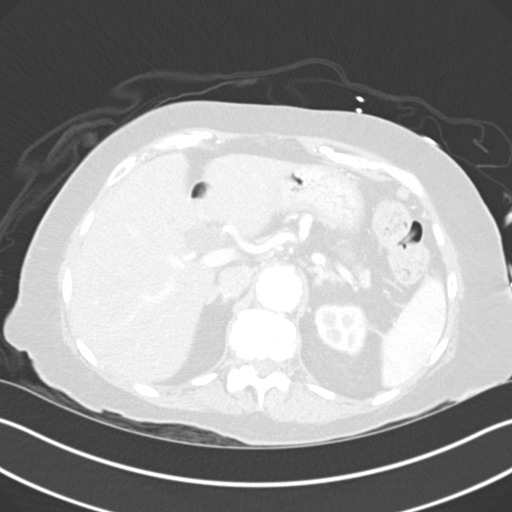
[im 14/94  soft-tissue]
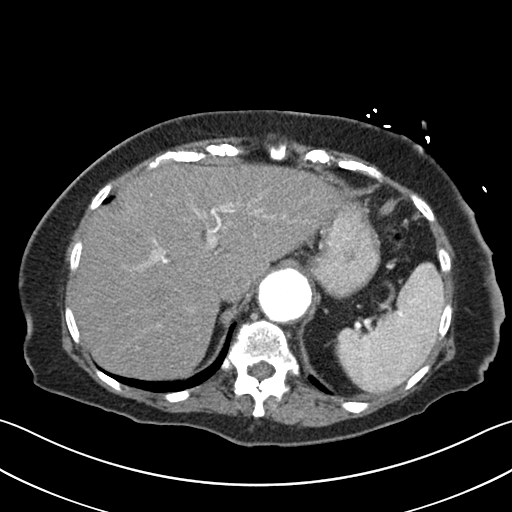
[im 20/94  lung]
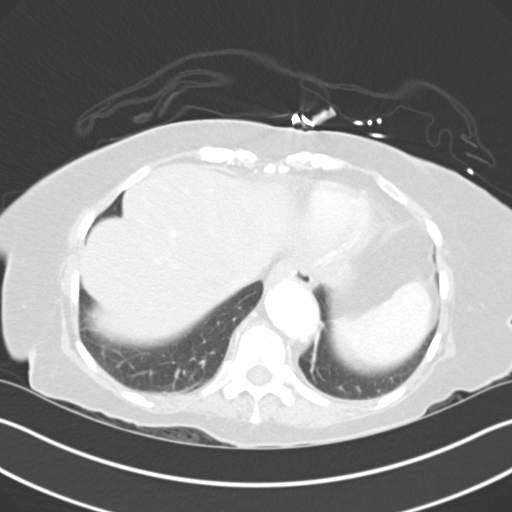
[im 27/94  soft-tissue]
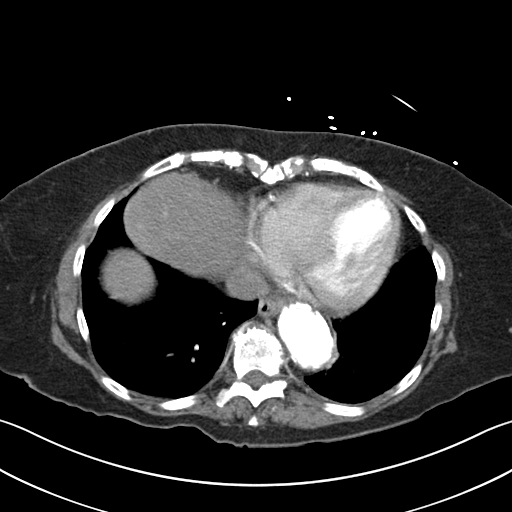
[im 34/94  lung]
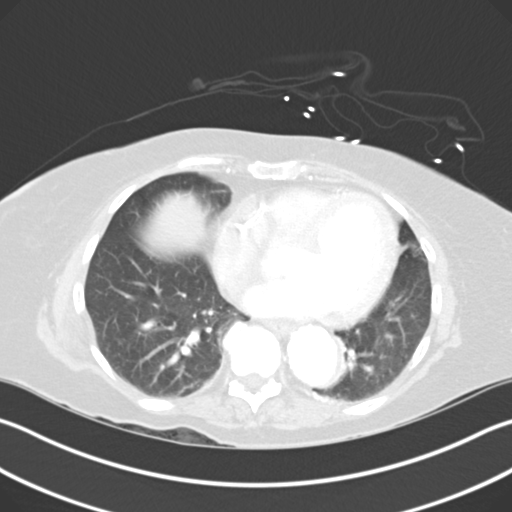
[im 40/94  soft-tissue]
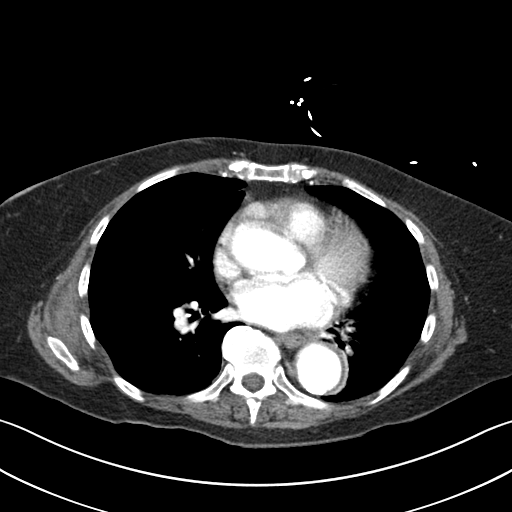
[im 47/94  lung]
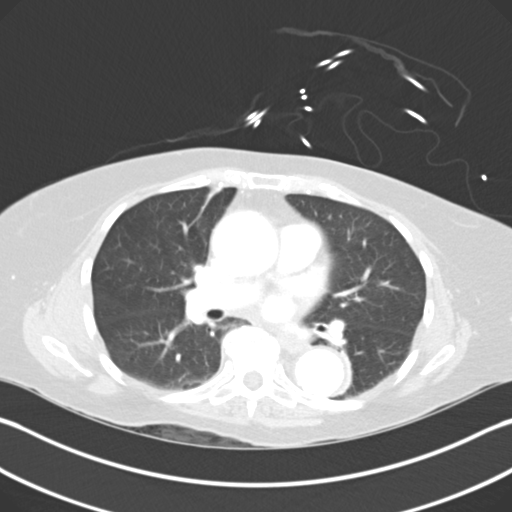
[im 54/94  soft-tissue]
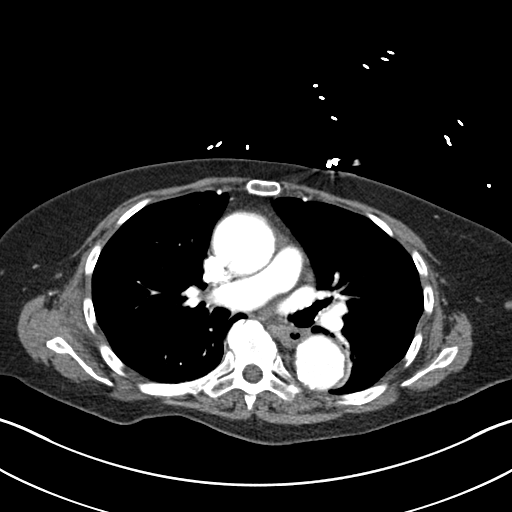
[im 60/94  lung]
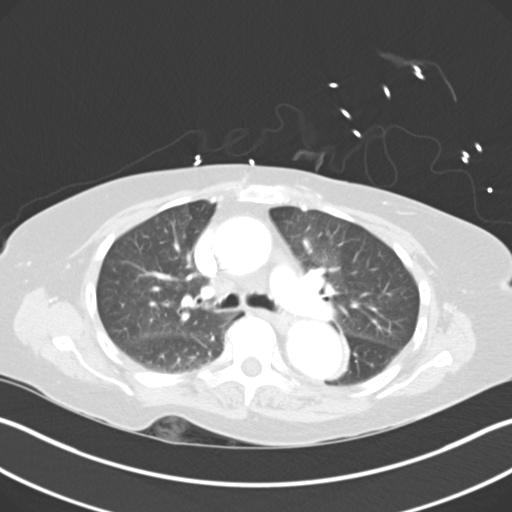
[im 67/94  soft-tissue]
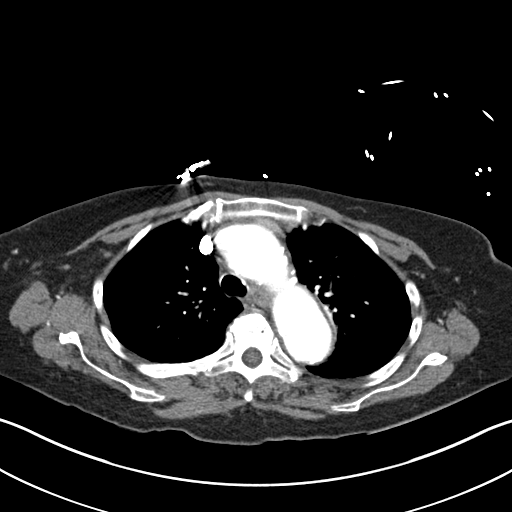
[im 74/94  lung]
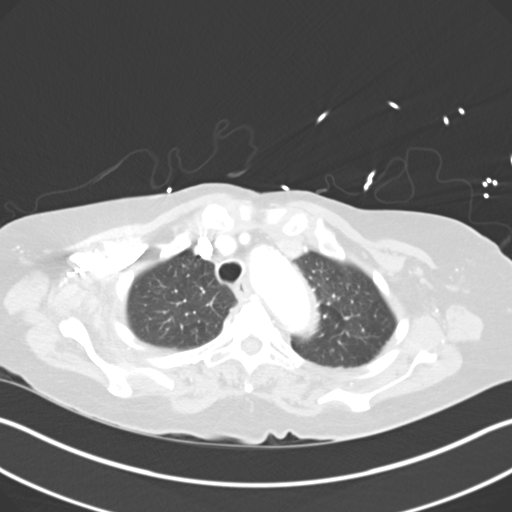
[im 80/94  soft-tissue]
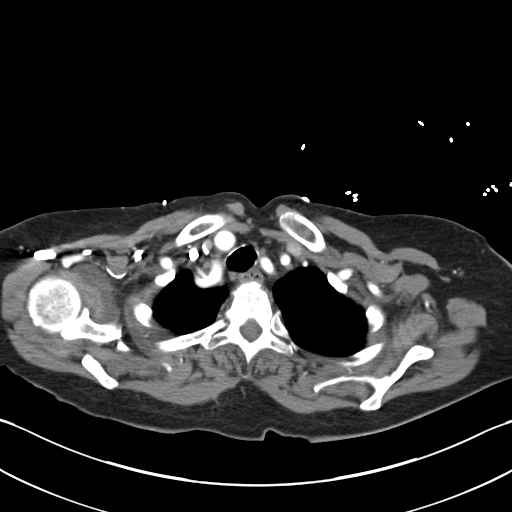
[im 87/94  lung]
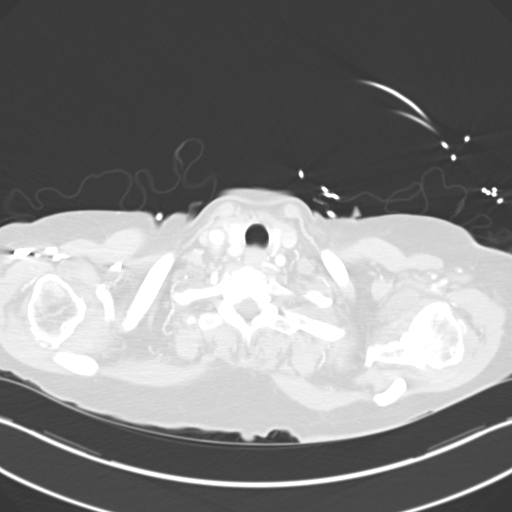

[Series 13: lung windows · axial · 0.72mm/px · z∈[-248,-98]mm · 5 of 53 slices shown]
[im 8/53  soft-tissue]
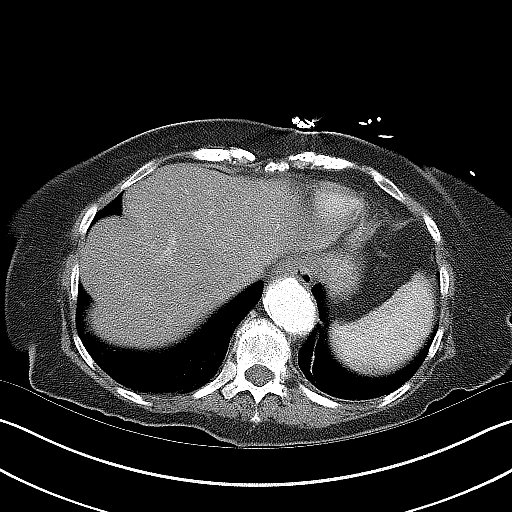
[im 15/53  soft-tissue]
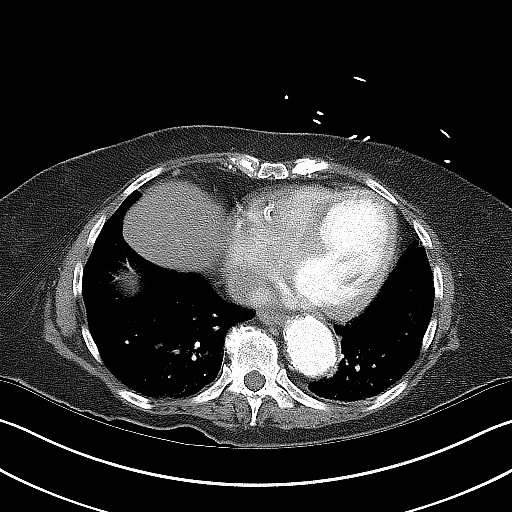
[im 23/53  soft-tissue]
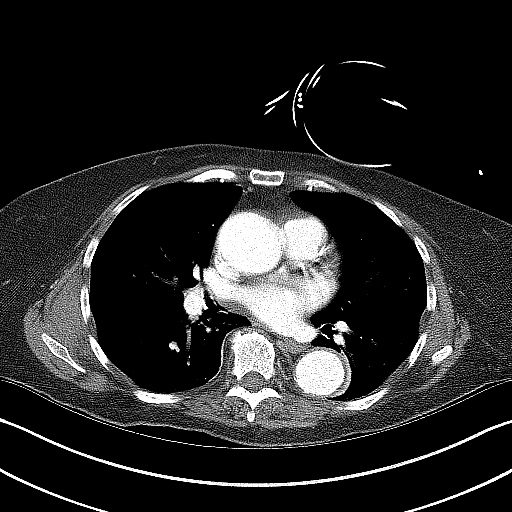
[im 30/53  soft-tissue]
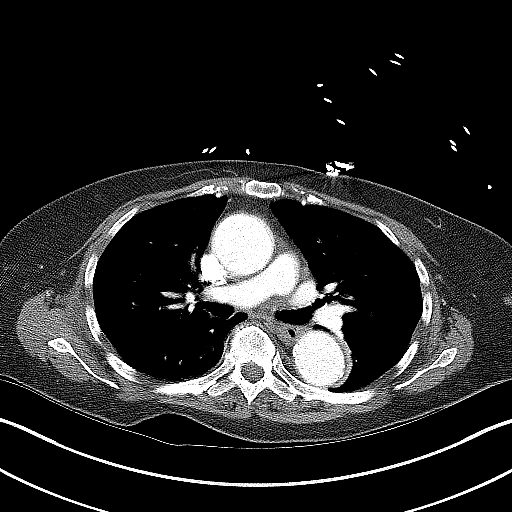
[im 38/53  soft-tissue]
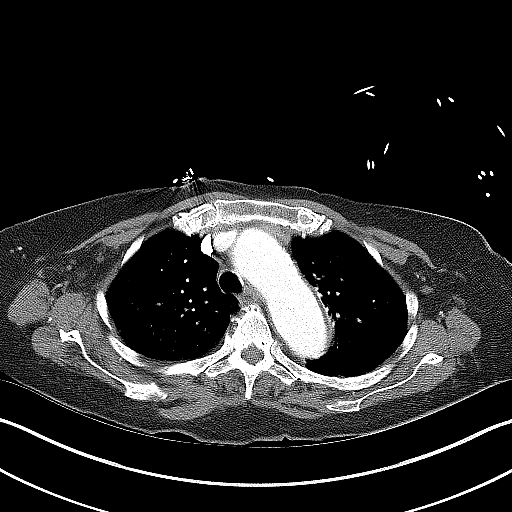

[18 of 32 positions shown; findings below may reference images not displayed]

FINDINGS: Initial noncontrast images demonstrate atherosclerotic
calcification of the thoracic aorta, branch vessels, and coronary
arteries.  Punctate calcifications in the liver and spleen suggest
remote granulomatous disease.  Prominent glenohumeral degenerative
arthropathy noted.

Arterial phase images demonstrate no evidence of dissection.  Wall
thickening associated with the atherosclerotic calcification noted,
particularly in the descending thoracic aorta.  There is aortic
tortuosity.  The proximal ascending thoracic aorta 4.2 cm; distal
ascending thoracic aorta 4.1 cm; mid arch diameter 3.3 cm; the
upper descending thoracic aorta 3.7 cm; mid thoracic descending
aorta 3.6 cm; at the hiatus the aorta measures 3.8 cm in diameter.
There is likely some minimal mural thrombus along the left margin
of the descending thoracic aorta.

Common bile duct 1.8 cm.  Minimal intrahepatic biliary dilatation.
By report the patient is had prior cholecystectomy.

No pathologic thoracic adenopathy.  There is a suggestion of mid
esophageal wall thickening.

Thoracic spondylosis and lower thoracic degenerative disc disease
noted.

Mild right apical pleuroparenchymal scarring.  Mild scarring
medially in the right upper lobe.  Dependent subsegmental
atelectasis noted in the right lower lobe.
IMPRESSION: 1.  Ectatic and tortuous thoracic aorta, without dissection.
Atherosclerotic calcification noted involving the thoracic
alignment, coronary arteries, and to a lesser extent the branch
vessels.
2.  Biliary dilatation.  Some of this may be air response
cholecystectomy.  There to suspected mid thoracic esophageal mild
wall thickening, query esophagitis.
3.  Degenerative glenohumeral arthropathy, with suspected shoulder
joint effusions.
4.  Thoracic spondylosis and degenerative disc disease.

## 2014-09-02 DIAGNOSIS — E1165 Type 2 diabetes mellitus with hyperglycemia: Secondary | ICD-10-CM | POA: Diagnosis not present

## 2014-09-02 DIAGNOSIS — E780 Pure hypercholesterolemia: Secondary | ICD-10-CM | POA: Diagnosis not present

## 2014-09-02 DIAGNOSIS — E559 Vitamin D deficiency, unspecified: Secondary | ICD-10-CM | POA: Diagnosis not present

## 2014-09-05 DIAGNOSIS — E780 Pure hypercholesterolemia: Secondary | ICD-10-CM | POA: Diagnosis not present

## 2014-09-05 DIAGNOSIS — I10 Essential (primary) hypertension: Secondary | ICD-10-CM | POA: Diagnosis not present

## 2014-09-05 DIAGNOSIS — E119 Type 2 diabetes mellitus without complications: Secondary | ICD-10-CM | POA: Diagnosis not present

## 2014-09-06 ENCOUNTER — Encounter: Payer: Self-pay | Admitting: Cardiovascular Disease

## 2014-10-03 NOTE — Progress Notes (Signed)
Patient ID: Karen [name], female   DOB: 08-02-1928, 81 y.o.   MRN: [id number]     Cardiology Office Note   Date:  10/04/2014   ID:  Karen [name], DOB 1928/03/28, MRN [id number]  PCP:  Thressa Sheller, MD  Cardiologist:   Sanda Klein, MD   Chief Complaint  Patient presents with  . 6 MONTHS    Patient has felt SOB and fatigue.      History of Present Illness: Karen [name] is a 81 y.o. female who presents for CAD, diastolic HF, HTN   she generally feels very well. She does complain about weakness in her legs if she tries to do to much. This is different from her presentation with dyspnea which eventually led to the diagnosis of coronary artery disease,  But she has never had angina pectoris. She has been able to paint her whole kitchen by herself. She does not describe claudication. She also complains about easy bruising, which has been a problem ever since she started clopidogrel.   a follow-up CT of the chest shows slight enlargement of her ascending aortic aneurysm from 4.3 cm to 4.7 cm  On October 11 2013 she had 2.25 x 20 mm Promus to 90% LAD- mid to distal and 3.5 x 12 mm Promus to 80% LCx-mid lesions. She had presented with profound fatigue and exertional dyspnea, but without angina. Symptoms did not resolve despite aggressive antiHTNive and antianginal pharmacological therapy. LVEF was 50%.  She has treated HTN and hyperlipidemia and mild DM. She had an incidentally discovered small aneurysm of the ascending aorta (43 mm, last evaluated December 2014).    Past Medical History  Diagnosis Date  . Hypertension   . H/O: gout   . Fibromyalgia   . Constipation   . Hx: UTI (urinary tract infection)   . Arthritis   . Coronary artery disease 04/2013    Three-vessel disease, initial medical therapy then stenting of the LAD and CFX 10/2013  . Diabetes mellitus     TYPE 2    Past Surgical History  Procedure Laterality Date  . Knee surgery  1997/2000  .  Replacement total hip w/  resurfacing implants  [id number]  . Spinal fusion  2006  . Joint replacement  left hip-2007, left knee-2000, right knee-1997    Left Hip, Bilateral Knee replacements  . Back surgery  1972  . Closed reduction of left total hip  2008    x 3  . Right foot surgery  2004    x 2  . Appendectomy  1951  . Cholecystectomy  1951  . Hernia repair  1963  . Salivary gland stone extraction    . Cardiac catheterization  04/2013    Left main 30-40%, prox LAD 40%, mid/distal LAD 90%, CFX 70%, OM 3 90%, RCA 40-50%, PDA 20-80%, EF 50%   . Coronary stent placement  10/11/2013    2.25 x 20 mm Promus DES to mid/distal LAD & 3.5 x 12 mm Promus DES Mid CX         DR Martinique  . Left heart catheterization with coronary angiogram N/A 04/25/2013    Procedure: LEFT HEART CATHETERIZATION WITH CORONARY ANGIOGRAM;  Surgeon: Sanda Klein, MD;  Location: [facility] CATH LAB;  Service: Cardiovascular;  Laterality: N/A;  . Percutaneous coronary stent intervention (pci-s) N/A 10/11/2013    Procedure: PERCUTANEOUS CORONARY STENT INTERVENTION (PCI-S);  Surgeon: Peter M Martinique, MD;  Location: [facility] CATH LAB;  Service: Cardiovascular;  Laterality: N/A;  Current Outpatient Prescriptions  Medication Sig Dispense Refill  . acetaminophen (TYLENOL) 650 MG CR tablet Take 650 mg by mouth 2 (two) times daily.    Marland Kitchen amLODipine (NORVASC) 5 MG tablet Take 1 tablet (5 mg total) by mouth daily. 90 tablet 3  . aspirin 81 MG tablet Take 1 tablet (81 mg total) by mouth daily. 90 tablet 3  . atorvastatin (LIPITOR) 40 MG tablet Take 1 tablet (40 mg total) by mouth daily. 90 tablet 3  . Calcium Carbonate-Vitamin D (CALCIUM 600+D) 600-400 MG-UNIT per tablet Take 1 tablet by mouth daily.    . furosemide (LASIX) 40 MG tablet Take 2 tablets (80 mg total) by mouth daily. 180 tablet 3  . metFORMIN (GLUCOPHAGE) 1000 MG tablet Take 1 tablet (1,000 mg total) by mouth 2 (two) times daily with a meal. Hold for 2 days, restart on 10/14/2013  60 tablet 0  . metoprolol succinate (TOPROL-XL) 50 MG 24 hr tablet Take 50 mg by mouth daily. Take with or immediately following a meal.    . mirtazapine (REMERON) 15 MG tablet Take 15 mg by mouth at bedtime.    . Multiple Vitamin (MULTIVITAMIN) tablet Take 1 tablet by mouth daily.    Vladimir Faster Glycol-Propyl Glycol (SYSTANE OP) Place 1 drop into both eyes daily as needed (dry eyes).    . potassium chloride SA (K-DUR,KLOR-CON) 20 MEQ tablet Take 1 tablet (20 mEq total) by mouth 2 (two) times daily. 180 tablet 3  . QUEtiapine (SEROQUEL) 200 MG tablet Take 200 mg by mouth at bedtime.    . sitaGLIPtin (JANUVIA) 100 MG tablet Take 100 mg by mouth daily.    . valsartan (DIOVAN) 320 MG tablet Take 1 tablet (320 mg total) by mouth daily. 90 tablet 3  . Coenzyme Q10 (CO Q 10) 10 MG CAPS Take 1 capsule by mouth daily. 30 each 0   No current facility-administered medications for this visit.    Allergies:   Lisinopril; Pneumococcal vaccines; Codeine; Morphine and related; and Ultram    Social History:  The patient  reports that she has never smoked. She has never used smokeless tobacco. She reports that she does not drink alcohol or use illicit drugs.   Family History:  The patient's family history includes Cancer in her brother and mother; Heart attack in her brother and father; Stroke in her father.    ROS:  Please see the history of present illness.    Otherwise, review of systems positive for none.   All other systems are reviewed and negative.    PHYSICAL EXAM: VS:  BP 102/68 mmHg  Pulse 74  Ht 5\' 4"  (1.626 m)  Wt 164 lb (74.39 kg)  BMI 28.14 kg/m2  LMP 05/10/1998 , BMI Body mass index is 28.14 kg/(m^2).  General: Alert, oriented x3, no distress Head: no evidence of trauma, PERRL, EOMI, no exophtalmos or lid lag, no myxedema, no xanthelasma; normal ears, nose and oropharynx Neck: normal jugular venous pulsations and no hepatojugular reflux; brisk carotid pulses without delay and no  carotid bruits Chest: clear to auscultation, no signs of consolidation by percussion or palpation, normal fremitus, symmetrical and full respiratory excursions Cardiovascular: normal position and quality of the apical impulse, regular rhythm, normal first and second heart sounds, no murmurs, rubs or gallops Abdomen: no tenderness or distention, no masses by palpation, no abnormal pulsatility or arterial bruits, normal bowel sounds, no hepatosplenomegaly Extremities: no clubbing, cyanosis or edema; 2+ radial, ulnar and brachial pulses bilaterally; 2+ right  femoral, posterior tibial and dorsalis pedis pulses; 2+ left femoral, posterior tibial and dorsalis pedis pulses; no subclavian or femoral bruits Neurological: grossly nonfocal Psych: euthymic mood, full affect   EKG:  EKG is ordered today. The ekg ordered today demonstrates  Sinus rhythm , questionable inferior wall Q waves, suspect misplacement of lead V3 , otherwise normal   Recent Labs: 11/05/2013: Hemoglobin 13.1; Platelets 207.0 12/18/2013: BUN 24*; Creatinine, Ser 1.0; Potassium 3.5; Sodium 140    Lipid Panel No results found for: CHOL, TRIG, HDL, CHOLHDL, VLDL, LDLCALC, LDLDIRECT    Wt Readings from Last 3 Encounters:  10/04/14 164 lb (74.39 kg)  04/04/14 172 lb 11.2 oz (78.336 kg)  12/18/13 171 lb 6.4 oz (77.747 kg)      Other studies Reviewed: Additional studies/ records that were reviewed today include:  CT angiography of the chest.   ASSESSMENT AND PLAN:  Chronic combined systolic and diastolic congestive heart failure  NYHA class II. no overt physical signs of hypervolemia today  Cardiomyopathy, mixed hypertensive and ischemic etiology  EF 45-50%, diffuse hypokinesis. Suspect that we will see improvement now that BP has been well controlled and she has had revascularization. On ARB and beta blocker.  HTN (hypertension)  Now well controlled  Diabetes mellitus   Hyperlipidemia  On statin therapy. Ideally  would like LDL<100, preferably <70.  We'll ask her to add coenzyme Q 10 300 mg daily. If no improvement in leg fatigue, consider a statin holiday and switching to rosuvastatin  CAD (coronary artery disease)  S/P DES of LCX and LAD .  It has been 12 months since she received the stents. Will stop clopidogrel especially since she has a lot of problems with bruising  Aneurysm of ascending aorta Stable enlargement of the thoracic aorta with a stable ascending thoracic aortic aneurysm. Ascending thoracic aorta measures up to 4.7 cm. Yearly imaging.   Current medicines are reviewed at length with the patient today.  The patient has concerns regarding medicines.  The following changes have been made:   Stop clopidogrel, start coenzyme Q 10  Labs/ tests ordered today include:   Orders Placed This Encounter  Procedures  . CT ANGIO CHEST AORTA W/CM &/OR WO/CM  . Creatinine  . EKG 12-Lead     Patient Instructions  Non-Cardiac CT scanning, (CAT scanning), is a noninvasive, special x-ray that produces cross-sectional images of the body using x-rays and a computer May 2017. CT scans help physicians diagnose and treat medical conditions. For some CT exams, a contrast material is used to enhance visibility in the area of the body being studied. CT scans provide greater clarity and reveal more details than regular x-ray exams.  Your physician recommends that you return for lab work in: prior to the CT scan at [facility] has recommended you make the following change in your medication: Stop the Plavix.  Start Co Q 10 300mg  daily (over the counter).  Dr. Sallyanne Kuster recommends that you schedule a follow-up appointment in: June 2017          Mikael Spray, MD  10/04/2014 6:29 PM    Sanda Klein, MD, [facility] [phone] office [phone] pager

## 2014-10-04 ENCOUNTER — Encounter: Payer: Self-pay | Admitting: Cardiovascular Disease

## 2014-10-04 ENCOUNTER — Ambulatory Visit (INDEPENDENT_AMBULATORY_CARE_PROVIDER_SITE_OTHER): Payer: Medicare Other | Admitting: Cardiovascular Disease

## 2014-10-04 VITALS — BP 102/68 | HR 74 | Ht 64.0 in | Wt 164.0 lb

## 2014-10-04 DIAGNOSIS — I255 Ischemic cardiomyopathy: Secondary | ICD-10-CM | POA: Diagnosis not present

## 2014-10-04 DIAGNOSIS — Z79899 Other long term (current) drug therapy: Secondary | ICD-10-CM | POA: Diagnosis not present

## 2014-10-04 DIAGNOSIS — I2510 Atherosclerotic heart disease of native coronary artery without angina pectoris: Secondary | ICD-10-CM | POA: Diagnosis not present

## 2014-10-04 DIAGNOSIS — I719 Aortic aneurysm of unspecified site, without rupture: Secondary | ICD-10-CM

## 2014-10-04 MED ORDER — CO Q 10 10 MG PO CAPS: 1.0000 | ORAL_CAPSULE | Freq: Every day | ORAL | Status: DC

## 2014-10-04 NOTE — Patient Instructions (Signed)
Non-Cardiac CT scanning, (CAT scanning), is a noninvasive, special x-ray that produces cross-sectional images of the body using x-rays and a computer May 2017. CT scans help physicians diagnose and treat medical conditions. For some CT exams, a contrast material is used to enhance visibility in the area of the body being studied. CT scans provide greater clarity and reveal more details than regular x-ray exams.  Your physician recommends that you return for lab work in: prior to the CT scan at [facility] has recommended you make the following change in your medication: Stop the Plavix.  Start Co Q 10 300mg  daily (over the counter).  Dr. Sallyanne Kuster recommends that you schedule a follow-up appointment in: June 2017

## 2015-01-13 DIAGNOSIS — E119 Type 2 diabetes mellitus without complications: Secondary | ICD-10-CM | POA: Diagnosis not present

## 2015-01-20 DIAGNOSIS — Z23 Encounter for immunization: Secondary | ICD-10-CM | POA: Diagnosis not present

## 2015-01-20 DIAGNOSIS — E1165 Type 2 diabetes mellitus with hyperglycemia: Secondary | ICD-10-CM | POA: Diagnosis not present

## 2015-03-17 DIAGNOSIS — D2230 Melanocytic nevi of unspecified part of face: Secondary | ICD-10-CM | POA: Diagnosis not present

## 2015-03-17 DIAGNOSIS — L821 Other seborrheic keratosis: Secondary | ICD-10-CM | POA: Diagnosis not present

## 2015-03-17 DIAGNOSIS — D239 Other benign neoplasm of skin, unspecified: Secondary | ICD-10-CM | POA: Diagnosis not present

## 2015-03-17 DIAGNOSIS — L814 Other melanin hyperpigmentation: Secondary | ICD-10-CM | POA: Diagnosis not present

## 2015-03-17 DIAGNOSIS — D225 Melanocytic nevi of trunk: Secondary | ICD-10-CM | POA: Diagnosis not present

## 2015-05-19 ENCOUNTER — Telehealth: Payer: Self-pay

## 2015-05-19 DIAGNOSIS — Z01812 Encounter for preprocedural laboratory examination: Secondary | ICD-10-CM

## 2015-05-19 NOTE — Telephone Encounter (Signed)
-----   Message from Lawana Pai sent at 05/19/2015  1:51 PM EDT ----- Regarding: Labwork Order This patient is having CT Scan done at [facility] on May 26- F/U aneurysm - and is seeing  Dr Loletha Grayer on June 5.  She needs order for BMET.  Patient called [employer] [facility] and scheduled herself.  Per Erline Levine at [facility], labs are needed and patient is aware. Thanks!

## 2015-05-28 DIAGNOSIS — I10 Essential (primary) hypertension: Secondary | ICD-10-CM | POA: Diagnosis not present

## 2015-05-28 DIAGNOSIS — Z0000 Encounter for general adult medical examination without abnormal findings: Secondary | ICD-10-CM | POA: Diagnosis not present

## 2015-05-28 DIAGNOSIS — E7800 Pure hypercholesterolemia, unspecified: Secondary | ICD-10-CM | POA: Diagnosis not present

## 2015-05-28 DIAGNOSIS — E1165 Type 2 diabetes mellitus with hyperglycemia: Secondary | ICD-10-CM | POA: Diagnosis not present

## 2015-05-28 DIAGNOSIS — E559 Vitamin D deficiency, unspecified: Secondary | ICD-10-CM | POA: Diagnosis not present

## 2015-06-02 DIAGNOSIS — E1165 Type 2 diabetes mellitus with hyperglycemia: Secondary | ICD-10-CM | POA: Diagnosis not present

## 2015-06-02 DIAGNOSIS — E7800 Pure hypercholesterolemia, unspecified: Secondary | ICD-10-CM | POA: Diagnosis not present

## 2015-06-02 DIAGNOSIS — G4700 Insomnia, unspecified: Secondary | ICD-10-CM | POA: Diagnosis not present

## 2015-06-02 DIAGNOSIS — Z1212 Encounter for screening for malignant neoplasm of rectum: Secondary | ICD-10-CM | POA: Diagnosis not present

## 2015-06-02 DIAGNOSIS — I10 Essential (primary) hypertension: Secondary | ICD-10-CM | POA: Diagnosis not present

## 2015-06-23 DIAGNOSIS — Z01812 Encounter for preprocedural laboratory examination: Secondary | ICD-10-CM | POA: Diagnosis not present

## 2015-06-24 ENCOUNTER — Telehealth: Payer: Self-pay

## 2015-06-24 DIAGNOSIS — Z79899 Other long term (current) drug therapy: Secondary | ICD-10-CM

## 2015-06-24 DIAGNOSIS — E1165 Type 2 diabetes mellitus with hyperglycemia: Secondary | ICD-10-CM | POA: Diagnosis not present

## 2015-06-24 LAB — BASIC METABOLIC PANEL
BUN: 19 mg/dL (ref 7–25)
CALCIUM: 9.4 mg/dL (ref 8.6–10.4)
CHLORIDE: 99 mmol/L (ref 98–110)
CO2: 29 mmol/L (ref 20–31)
CREATININE: 1.12 mg/dL — AB (ref 0.60–0.88)
Glucose, Bld: 222 mg/dL — ABNORMAL HIGH (ref 65–99)
Potassium: 3.7 mmol/L (ref 3.5–5.3)
Sodium: 138 mmol/L (ref 135–146)

## 2015-06-24 NOTE — Telephone Encounter (Signed)
Called patient with results. Patient verbalized understanding and agreed with plan. Patient states she saw Darl Householder, NP. She made medication adjustments. KMartin, NP cut Januvia in half (down to 50 mg) which the patient was told that this decreased dose would help her kidney function come back to normal.   Labs ordered. Will give slip to patient at upcoming office visit, 07/14/2015.

## 2015-06-24 NOTE — Telephone Encounter (Signed)
-----   Message from Sanda Klein, MD sent at 06/24/2015  9:42 AM EDT ----- Blood sugar is high and creatinine very slightly elevated. Recheck in 3 months please (can do w Dr. Noah Delaine)

## 2015-07-04 ENCOUNTER — Ambulatory Visit (INDEPENDENT_AMBULATORY_CARE_PROVIDER_SITE_OTHER)
Admission: RE | Admit: 2015-07-04 | Discharge: 2015-07-04 | Disposition: A | Discharge status: Home / Self Care | Payer: Medicare Other | Source: Ambulatory Visit | Attending: Cardiovascular Disease | Admitting: Cardiovascular Disease

## 2015-07-04 DIAGNOSIS — I719 Aortic aneurysm of unspecified site, without rupture: Secondary | ICD-10-CM | POA: Diagnosis not present

## 2015-07-04 DIAGNOSIS — I712 Thoracic aortic aneurysm, without rupture: Secondary | ICD-10-CM | POA: Diagnosis not present

## 2015-07-04 MED ORDER — IOPAMIDOL (ISOVUE-370) INJECTION 76%
80.0000 mL | Freq: Once | INTRAVENOUS | Status: AC | PRN
  Administered 2015-07-04: 80 mL via INTRAVENOUS

## 2015-07-14 ENCOUNTER — Ambulatory Visit (INDEPENDENT_AMBULATORY_CARE_PROVIDER_SITE_OTHER): Payer: Medicare Other | Admitting: Cardiovascular Disease

## 2015-07-14 ENCOUNTER — Encounter: Payer: Self-pay | Admitting: Cardiovascular Disease

## 2015-07-14 VITALS — BP 138/80 | HR 74 | Ht 64.0 in | Wt 166.2 lb

## 2015-07-14 DIAGNOSIS — I2510 Atherosclerotic heart disease of native coronary artery without angina pectoris: Secondary | ICD-10-CM

## 2015-07-14 DIAGNOSIS — I10 Essential (primary) hypertension: Secondary | ICD-10-CM

## 2015-07-14 DIAGNOSIS — I712 Thoracic aortic aneurysm, without rupture, unspecified: Secondary | ICD-10-CM

## 2015-07-14 DIAGNOSIS — I5032 Chronic diastolic (congestive) heart failure: Secondary | ICD-10-CM

## 2015-07-14 DIAGNOSIS — R29898 Other symptoms and signs involving the musculoskeletal system: Secondary | ICD-10-CM

## 2015-07-14 NOTE — Patient Instructions (Signed)
Dr Sallyanne Kuster has recommended making the following medication changes: 1. STOP Atorvastatin  Dr Sallyanne Kuster recommends that you schedule a follow-up appointment in 3 months.  If you need a refill on your cardiac medications before your next appointment, please call your pharmacy.

## 2015-07-15 DIAGNOSIS — R29898 Other symptoms and signs involving the musculoskeletal system: Secondary | ICD-10-CM | POA: Insufficient documentation

## 2015-07-15 NOTE — Progress Notes (Signed)
Patient ID: ADALIND GULAS, female   DOB: 1928-06-28, 82 y.o.   MRN: [id number]    Cardiology Office Note    Date:  07/15/2015   ID:  JAKE ZARRELLA, DOB March 06, 1928, MRN [id number]  PCP:  Thressa Sheller, MD  Cardiologist:   Sanda Klein, MD   Chief Complaint  Patient presents with  . Follow-up    9 months  pt c/o weakness in legs    History of Present Illness:  SAHNI TORDOFF is a 82 y.o. female with coronary artery disease, history of chronic diastolic heart failure (improved after treatment of coronary disease), hypertension and an ascending aortic aneurysm last measured at 4.7 cm in maximum diameter.  She has never had angina pectoris but presented with dyspnea. She does not have any issues of dyspnea at this time. However she complains of weak legs when walking. Over the last 6 months this has been a persistent problem. After walking to 3 times from one room of her house to another she has to stop and rest. We tried a statin holiday for one month earlier this year, without any benefit. She is now back on atorvastatin. She is able to do all her usual housework and her laundry despite this. She has a history of cervical spine and lumbar spine fusion surgeries. She does not have neuropathic pain.  On October 11 2013 she had 2.25 x 20 mm Promus to 90% LAD- mid to distal and 3.5 x 12 mm Promus to 80% LCx-mid lesions. She had presented with profound fatigue and exertional dyspnea, but without angina. Symptoms did not resolve despite aggressive antiHTNive and antianginal pharmacological therapy. LVEF was 50%. Symptoms improved rapidly after placement of the stents. She has treated HTN and hyperlipidemia and mild DM.   Past Medical History  Diagnosis Date  . Hypertension   . H/O: gout   . Fibromyalgia   . Constipation   . Hx: UTI (urinary tract infection)   . Arthritis   . Coronary artery disease 04/2013    Three-vessel disease, initial medical therapy then stenting of the LAD  and CFX 10/2013  . Diabetes mellitus     TYPE 2    Past Surgical History  Procedure Laterality Date  . Knee surgery  1997/2000  . Replacement total hip w/  resurfacing implants  [id number]  . Spinal fusion  2006  . Joint replacement  left hip-2007, left knee-2000, right knee-1997    Left Hip, Bilateral Knee replacements  . Back surgery  1972  . Closed reduction of left total hip  2008    x 3  . Right foot surgery  2004    x 2  . Appendectomy  1951  . Cholecystectomy  1951  . Hernia repair  1963  . Salivary gland stone extraction    . Cardiac catheterization  04/2013    Left main 30-40%, prox LAD 40%, mid/distal LAD 90%, CFX 70%, OM 3 90%, RCA 40-50%, PDA 20-80%, EF 50%   . Coronary stent placement  10/11/2013    2.25 x 20 mm Promus DES to mid/distal LAD & 3.5 x 12 mm Promus DES Mid CX         DR Martinique  . Left heart catheterization with coronary angiogram N/A 04/25/2013    Procedure: LEFT HEART CATHETERIZATION WITH CORONARY ANGIOGRAM;  Surgeon: Sanda Klein, MD;  Location: [facility] CATH LAB;  Service: Cardiovascular;  Laterality: N/A;  . Percutaneous coronary stent intervention (pci-s) N/A 10/11/2013    Procedure: PERCUTANEOUS CORONARY  STENT INTERVENTION (PCI-S);  Surgeon: Peter M Martinique, MD;  Location: [facility] CATH LAB;  Service: Cardiovascular;  Laterality: N/A;    Current Medications: Outpatient Prescriptions Prior to Visit  Medication Sig Dispense Refill  . amLODipine (NORVASC) 5 MG tablet Take 1 tablet (5 mg total) by mouth daily. 90 tablet 3  . aspirin 81 MG tablet Take 1 tablet (81 mg total) by mouth daily. 90 tablet 3  . Calcium Carbonate-Vitamin D (CALCIUM 600+D) 600-400 MG-UNIT per tablet Take 1 tablet by mouth daily.    . furosemide (LASIX) 40 MG tablet Take 2 tablets (80 mg total) by mouth daily. 180 tablet 3  . metFORMIN (GLUCOPHAGE) 1000 MG tablet Take 1 tablet (1,000 mg total) by mouth 2 (two) times daily with a meal. Hold for 2 days, restart on 10/14/2013 60 tablet 0  .  metoprolol succinate (TOPROL-XL) 50 MG 24 hr tablet Take 50 mg by mouth daily. Take with or immediately following a meal.    . mirtazapine (REMERON) 15 MG tablet Take 15 mg by mouth at bedtime.    . Multiple Vitamin (MULTIVITAMIN) tablet Take 1 tablet by mouth daily.    Vladimir Faster Glycol-Propyl Glycol (SYSTANE OP) Place 1 drop into both eyes daily as needed (dry eyes).    . potassium chloride SA (K-DUR,KLOR-CON) 20 MEQ tablet Take 1 tablet (20 mEq total) by mouth 2 (two) times daily. 180 tablet 3  . QUEtiapine (SEROQUEL) 200 MG tablet Take 200 mg by mouth at bedtime.    . valsartan (DIOVAN) 320 MG tablet Take 1 tablet (320 mg total) by mouth daily. 90 tablet 3  . atorvastatin (LIPITOR) 40 MG tablet Take 1 tablet (40 mg total) by mouth daily. 90 tablet 3  . acetaminophen (TYLENOL) 650 MG CR tablet Take 650 mg by mouth 2 (two) times daily.    . Coenzyme Q10 (CO Q 10) 10 MG CAPS Take 1 capsule by mouth daily. 30 each 0  . sitaGLIPtin (JANUVIA) 100 MG tablet Take 100 mg by mouth daily.     No facility-administered medications prior to visit.     Allergies:   Lisinopril; Pneumococcal vaccines; Codeine; Morphine and related; and Ultram   Social History   Social History  . Marital Status: Widowed    Spouse Name: N/A  . Number of Children: N/A  . Years of Education: N/A   Social History Main Topics  . Smoking status: Never Smoker   . Smokeless tobacco: Never Used  . Alcohol Use: No  . Drug Use: No  . Sexual Activity: No   Other Topics Concern  . None   Social History Narrative     Family History:  The patient's family history includes Cancer in her brother and mother; Heart attack in her brother and father; Stroke in her father.   ROS:   Please see the history of present illness.    ROS All other systems reviewed and are negative.   PHYSICAL EXAM:   VS:  BP 138/80 mmHg  Pulse 74  Ht 5\' 4"  (1.626 m)  Wt 75.388 kg (166 lb 3.2 oz)  BMI 28.51 kg/m2  LMP 05/10/1998   GEN: Well  nourished, well developed, in no acute distress HEENT: normal Neck: no JVD, carotid bruits, or masses Cardiac: RRR; no murmurs, rubs, or gallops,no edema  Respiratory:  clear to auscultation bilaterally, normal work of breathing GI: soft, nontender, nondistended, + BS MS: no deformity or atrophy Skin: warm and dry, no rash Neuro:  Alert and Oriented  x 3, Strength and sensation are intact Psych: euthymic mood, full affect  Wt Readings from Last 3 Encounters:  07/14/15 75.388 kg (166 lb 3.2 oz)  10/04/14 74.39 kg (164 lb)  04/04/14 78.336 kg (172 lb 11.2 oz)      Studies/Labs Reviewed:   EKG:  EKG is ordered today.  The ekg ordered today demonstrates Normal sinus rhythm, nonspecific intraventricular conduction delay with QRS duration 118 ms, QTC 468 ms.  Recent Labs: 06/23/2015: BUN 19; Creat 1.12*; Potassium 3.7; Sodium 138   Lipid Panel No results found for: CHOL, TRIG, HDL, CHOLHDL, VLDL, LDLCALC, LDLDIRECT   ASSESSMENT:    1. Coronary artery disease involving native coronary artery of native heart without angina pectoris   2. Chronic diastolic congestive heart failure ([place])   3. Thoracic aortic aneurysm without rupture ([place])   4. Essential hypertension   5. Bilateral leg weakness      PLAN:  In order of problems listed above:  1. CAD: Currently asymptomatic roughly 2 years after placement of stents in the LAD and left circumflex arteries. She never had angina, but presented with shortness of breath/exacerbation of diastolic heart failure. 2. CHF: Appears clinically euvolemic, compensated. 3. TAAA: Asymptomatic, recent slight enlargement, plan to reimage in May 2018. Consider surgical referral if the aneurysm exceeds 5 cm in diameter. Continue careful management of hypertension, including use of beta blocker 4. HTN: Blood pressure control is good, at home her blood pressure is substantially lower than in the office. 5. I'm still concerned that her leg weakness may be  secondary to statin induced myopathy. She does not have claudication or neuralgia. We'll ask her to stop the atorvastatin completely for 3 months this time and continue taking coenzyme Q10. It may be time for her to see her neurosurgeon again, specially if the symptoms do not improve with the statin holiday.    Medication Adjustments/Labs and Tests Ordered: Current medicines are reviewed at length with the patient today.  Concerns regarding medicines are outlined above.  Medication changes, Labs and Tests ordered today are listed in the Patient Instructions below. Patient Instructions  Dr Sallyanne Kuster has recommended making the following medication changes: 1. STOP Atorvastatin  Dr Sallyanne Kuster recommends that you schedule a follow-up appointment in 3 months.  If you need a refill on your cardiac medications before your next appointment, please call your pharmacy.    Signed, Sanda Klein, MD  07/15/2015 4:47 PM    [facility] [place], [place], [place]  [address] Phone: [phone]; Fax: [phone]

## 2015-09-23 DIAGNOSIS — E1165 Type 2 diabetes mellitus with hyperglycemia: Secondary | ICD-10-CM | POA: Diagnosis not present

## 2015-09-25 DIAGNOSIS — E1165 Type 2 diabetes mellitus with hyperglycemia: Secondary | ICD-10-CM | POA: Diagnosis not present

## 2015-10-22 ENCOUNTER — Encounter: Payer: Self-pay | Admitting: Cardiovascular Disease

## 2015-10-22 ENCOUNTER — Ambulatory Visit (INDEPENDENT_AMBULATORY_CARE_PROVIDER_SITE_OTHER): Payer: Medicare Other | Admitting: Cardiovascular Disease

## 2015-10-22 VITALS — BP 174/88 | HR 83 | Ht 64.0 in | Wt 160.6 lb

## 2015-10-22 DIAGNOSIS — I10 Essential (primary) hypertension: Secondary | ICD-10-CM | POA: Diagnosis not present

## 2015-10-22 DIAGNOSIS — E1159 Type 2 diabetes mellitus with other circulatory complications: Secondary | ICD-10-CM

## 2015-10-22 DIAGNOSIS — I712 Thoracic aortic aneurysm, without rupture, unspecified: Secondary | ICD-10-CM

## 2015-10-22 DIAGNOSIS — I5042 Chronic combined systolic (congestive) and diastolic (congestive) heart failure: Secondary | ICD-10-CM

## 2015-10-22 DIAGNOSIS — I2510 Atherosclerotic heart disease of native coronary artery without angina pectoris: Secondary | ICD-10-CM | POA: Diagnosis not present

## 2015-10-22 DIAGNOSIS — E785 Hyperlipidemia, unspecified: Secondary | ICD-10-CM

## 2015-10-22 NOTE — Patient Instructions (Signed)
Dr Croitoru recommends that you schedule a follow-up appointment in 6 months. You will receive a reminder letter in the mail two months in advance. If you don't receive a letter, please call our office to schedule the follow-up appointment.  If you need a refill on your cardiac medications before your next appointment, please call your pharmacy. 

## 2015-10-22 NOTE — Progress Notes (Signed)
Patient ID: Karen [name], female   DOB: June 08, 1928, 82 y.o.   MRN: [id number]    Cardiology Office Note    Date:  10/22/2015   ID:  Karen [name], DOB [birth year], MRN [id number]  PCP:  Thressa Sheller, MD  Cardiologist:   Sanda Klein, MD   Chief Complaint  Patient presents with  . Follow-up    3 Months; Pt states no Sx.    History of Present Illness:  Karen [name] is a 82 y.o. female with coronary artery disease, history of chronic diastolic heart failure (improved after treatment of coronary disease), hypertension and an ascending aortic aneurysm last measured at 4.7 cm in maximum diameter.  She has never had angina pectoris but presented with dyspnea. She does not have any issues of dyspnea at this time.  At her last appointment we stopped her atorvastatin since she complained of bilateral leg weakness with walking. She does not describe intermittent claudication. Since then she feels that her leg weakness is "a little better". She has lost 6-9 pounds over the last few months and her hemoglobin A1c has improved from 8% to 6.2%. She continues to take CoQ10.  On October 11 2013 she had 2.25 x 20 mm Promus to 90% LAD- mid to distal and 3.5 x 12 mm Promus to 80% LCx-mid lesions. She had presented with profound fatigue and exertional dyspnea, but without angina. Symptoms did not resolve despite aggressive antiHTNive and antianginal pharmacological therapy. LVEF was 50%. Symptoms improved rapidly after placement of the stents. She has treated HTN and hyperlipidemia and mild DM.   Past Medical History:  Diagnosis Date  . Arthritis   . Constipation   . Coronary artery disease 04/2013   Three-vessel disease, initial medical therapy then stenting of the LAD and CFX 10/2013  . Diabetes mellitus    TYPE 2  . Fibromyalgia   . H/O: gout   . Hx: UTI (urinary tract infection)   . Hypertension     Past Surgical History:  Procedure Laterality Date  . APPENDECTOMY  1951  .  [place]  . CARDIAC CATHETERIZATION  04/2013   Left main 30-40%, prox LAD 40%, mid/distal LAD 90%, CFX 70%, OM 3 90%, RCA 40-50%, PDA 20-80%, EF 50%   . CHOLECYSTECTOMY  1951  . Closed reduction of Left Total Hip  2008   x 3  . CORONARY STENT PLACEMENT  10/11/2013   2.25 x 20 mm Promus DES to mid/distal LAD & 3.5 x 12 mm Promus DES Mid CX         DR Martinique  . [place]  . JOINT REPLACEMENT  left hip-2007, left knee-2000, right knee-1997   Left Hip, Bilateral Knee replacements  . KNEE SURGERY  1997/2000  . LEFT HEART CATHETERIZATION WITH CORONARY ANGIOGRAM N/A 04/25/2013   Procedure: LEFT HEART CATHETERIZATION WITH CORONARY ANGIOGRAM;  Surgeon: Sanda Klein, MD;  Location: [facility] CATH LAB;  Service: Cardiovascular;  Laterality: N/A;  . PERCUTANEOUS CORONARY STENT INTERVENTION (PCI-S) N/A 10/11/2013   Procedure: PERCUTANEOUS CORONARY STENT INTERVENTION (PCI-S);  Surgeon: Peter M Martinique, MD;  Location: [facility] CATH LAB;  Service: Cardiovascular;  Laterality: N/A;  . REPLACEMENT TOTAL HIP W/  RESURFACING IMPLANTS  [id number],3154  . Right Foot Surgery  2004   x 2  . Salivary Gland Stone Extraction    . SPINAL FUSION  2006    Current Medications: Outpatient Medications Prior to Visit  Medication Sig Dispense Refill  . amLODipine (NORVASC) 5  MG tablet Take 1 tablet (5 mg total) by mouth daily. 90 tablet 3  . aspirin 81 MG tablet Take 1 tablet (81 mg total) by mouth daily. 90 tablet 3  . Calcium Carbonate-Vitamin D (CALCIUM 600+D) 600-400 MG-UNIT per tablet Take 1 tablet by mouth daily.    . Coenzyme Q10 (CO Q 10 PO) Take 300 mg by mouth daily.    . furosemide (LASIX) 40 MG tablet Take 2 tablets (80 mg total) by mouth daily. 180 tablet 3  . JANUVIA 50 MG tablet Take 1 tablet by mouth daily.    . metFORMIN (GLUCOPHAGE) 1000 MG tablet Take 1 tablet (1,000 mg total) by mouth 2 (two) times daily with a meal. Hold for 2 days, restart on 10/14/2013 60 tablet 0  . metoprolol succinate  (TOPROL-XL) 50 MG 24 hr tablet Take 50 mg by mouth daily. Take with or immediately following a meal.    . mirtazapine (REMERON) 15 MG tablet Take 15 mg by mouth at bedtime.    . Multiple Vitamin (MULTIVITAMIN) tablet Take 1 tablet by mouth daily.    Vladimir Faster Glycol-Propyl Glycol (SYSTANE OP) Place 1 drop into both eyes daily as needed (dry eyes).    . potassium chloride SA (K-DUR,KLOR-CON) 20 MEQ tablet Take 1 tablet (20 mEq total) by mouth 2 (two) times daily. 180 tablet 3  . QUEtiapine (SEROQUEL) 200 MG tablet Take 200 mg by mouth at bedtime.    . valsartan (DIOVAN) 320 MG tablet Take 1 tablet (320 mg total) by mouth daily. 90 tablet 3   No facility-administered medications prior to visit.      Allergies:   Lisinopril; Pneumococcal vaccines; Codeine; Morphine and related; and Ultram [tramadol hcl]   Social History   Social History  . Marital status: Widowed    Spouse name: N/A  . Number of children: N/A  . Years of education: N/A   Social History Main Topics  . Smoking status: Never Smoker  . Smokeless tobacco: Never Used  . Alcohol use No  . Drug use: No  . Sexual activity: No   Other Topics Concern  . None   Social History Narrative  . None     Family History:  The patient's family history includes Cancer in her brother and mother; Heart attack in her brother and father; Stroke in her father.   ROS:   Please see the history of present illness.    ROS All other systems reviewed and are negative.   PHYSICAL EXAM:   VS:  BP (!) 174/88   Pulse 83   Ht 5\' 4"  (1.626 m)   Wt 160 lb 9.6 oz (72.8 kg)   LMP 05/10/1998   BMI 27.57 kg/m    GEN: Well nourished, well developed, in no acute distress  HEENT: normal  Neck: no JVD, carotid bruits, or masses Cardiac: RRR; no murmurs, rubs, or gallops,no edema  Respiratory:  clear to auscultation bilaterally, normal work of breathing GI: soft, nontender, nondistended, + BS MS: no deformity or atrophy  Skin: warm and dry,  no rash Neuro:  Alert and Oriented x 3, Strength and sensation are intact Psych: euthymic mood, full affect  Wt Readings from Last 3 Encounters:  10/22/15 160 lb 9.6 oz (72.8 kg)  07/14/15 166 lb 3.2 oz (75.4 kg)  10/04/14 164 lb (74.4 kg)      Studies/Labs Reviewed:   EKG:  EKG is ordered today.  The ekg ordered today demonstrates Normal sinus rhythm, nonspecific intraventricular conduction  delay with QRS duration 118 ms, QTC 468 ms.  Recent Labs: 06/23/2015: BUN 19; Creat 1.12; Potassium 3.7; Sodium 138  09/23/2015: A1c 6.2%, creatinine 1.1, potassium 3.7  Lipid Panel 06/23/2015 on statin Total cholesterol 153, triglycerides 97, HDL 65, LDL 69  ASSESSMENT:    1. Chronic combined systolic and diastolic congestive heart failure ([place])   2. Coronary artery disease involving native coronary artery of native heart without angina pectoris   3. Thoracic aortic aneurysm without rupture ([place])   4. Essential hypertension   5. Hyperlipidemia   6. Type 2 diabetes mellitus with other circulatory complication, without long-term current use of insulin (H[facility])      PLAN:  In order of problems listed above:  1. CHF: Have not reevaluated left ventricular ejection fraction since her revascularization procedure. It is possible that her wall motion and LVEF have returned to normal. Appears well compensated, NYHA functional class I, euvolemic. 2. CAD: Currently asymptomatic roughly 2 years after placement of stents in the LAD and left circumflex arteries. She never had angina, but presented with shortness of breath/exacerbation of diastolic heart failure. 3. TAAA: Asymptomatic, recent slight enlargement, plan to reimage in May 2018. Consider surgical referral if the aneurysm exceeds 5 cm in diameter. Continue careful management of hypertension, including use of beta blocker 4. HTN: Blood pressure control is good, at home her blood pressure is substantially lower than in the office. 5. HLP: Possible  leg weakness secondary to statin induced myopathy. She does not have claudication or neuralgia. Will rechallenge with atorvastatin if LDL greater than 100 and continue taking coenzyme Q10. It may be time for her to see her neurosurgeon again, specially if the symptoms do not improve. 6. DM: Markedly improved control    Medication Adjustments/Labs and Tests Ordered: Current medicines are reviewed at length with the patient today.  Concerns regarding medicines are outlined above.  Medication changes, Labs and Tests ordered today are listed in the Patient Instructions below. Patient Instructions  Dr Sallyanne Kuster recommends that you schedule a follow-up appointment in 6 months. You will receive a reminder letter in the mail two months in advance. If you don't receive a letter, please call our office to schedule the follow-up appointment.  If you need a refill on your cardiac medications before your next appointment, please call your pharmacy.    Signed, Sanda Klein, MD  10/22/2015 2:01 PM    [facility] [place], [place],   [address] Phone: [phone]; Fax: [phone]

## 2015-11-25 DIAGNOSIS — E1165 Type 2 diabetes mellitus with hyperglycemia: Secondary | ICD-10-CM | POA: Diagnosis not present

## 2015-11-25 DIAGNOSIS — E7800 Pure hypercholesterolemia, unspecified: Secondary | ICD-10-CM | POA: Diagnosis not present

## 2015-11-28 DIAGNOSIS — Z1231 Encounter for screening mammogram for malignant neoplasm of breast: Secondary | ICD-10-CM | POA: Diagnosis not present

## 2015-12-08 ENCOUNTER — Telehealth: Payer: Self-pay | Admitting: Cardiovascular Disease

## 2015-12-08 DIAGNOSIS — E1165 Type 2 diabetes mellitus with hyperglycemia: Secondary | ICD-10-CM | POA: Diagnosis not present

## 2015-12-08 DIAGNOSIS — I10 Essential (primary) hypertension: Secondary | ICD-10-CM | POA: Diagnosis not present

## 2015-12-08 DIAGNOSIS — E7800 Pure hypercholesterolemia, unspecified: Secondary | ICD-10-CM | POA: Diagnosis not present

## 2015-12-08 DIAGNOSIS — Z23 Encounter for immunization: Secondary | ICD-10-CM | POA: Diagnosis not present

## 2015-12-08 NOTE — Telephone Encounter (Signed)
°  Follow Up   Pt calling to speak to RN regarding going back of Lipitor. She is returning call from earlier.

## 2015-12-08 NOTE — Telephone Encounter (Signed)
Unable to get ahold of patient. No answer on home phone when dialed. Unclear but I think this is FYI only - she had gone off lipitor in June at provider instruction and was instructed to restart when seen in Sept conditional to new lipid panel results. I do not see more recent lipid studies than 05/28/15 (which were from outside report scanned in on 09/23/15).

## 2015-12-08 NOTE — Telephone Encounter (Signed)
New message    Patient wants the MD to know she will be going back on her Lipitor.

## 2015-12-09 MED ORDER — ATORVASTATIN CALCIUM 40 MG PO TABS: 40.0000 mg | ORAL_TABLET | Freq: Every day | ORAL | 11 refills | Status: DC

## 2015-12-09 NOTE — Telephone Encounter (Signed)
Spoke with pt, she does nopt need anything right now.

## 2016-03-02 DIAGNOSIS — E7800 Pure hypercholesterolemia, unspecified: Secondary | ICD-10-CM | POA: Diagnosis not present

## 2016-03-02 DIAGNOSIS — E1165 Type 2 diabetes mellitus with hyperglycemia: Secondary | ICD-10-CM | POA: Diagnosis not present

## 2016-03-09 DIAGNOSIS — E1165 Type 2 diabetes mellitus with hyperglycemia: Secondary | ICD-10-CM | POA: Diagnosis not present

## 2016-03-09 DIAGNOSIS — E7800 Pure hypercholesterolemia, unspecified: Secondary | ICD-10-CM | POA: Diagnosis not present

## 2016-03-09 DIAGNOSIS — I10 Essential (primary) hypertension: Secondary | ICD-10-CM | POA: Diagnosis not present

## 2016-03-23 DIAGNOSIS — L570 Actinic keratosis: Secondary | ICD-10-CM | POA: Diagnosis not present

## 2016-03-23 DIAGNOSIS — L821 Other seborrheic keratosis: Secondary | ICD-10-CM | POA: Diagnosis not present

## 2016-03-23 DIAGNOSIS — Z86018 Personal history of other benign neoplasm: Secondary | ICD-10-CM | POA: Diagnosis not present

## 2016-03-23 DIAGNOSIS — D225 Melanocytic nevi of trunk: Secondary | ICD-10-CM | POA: Diagnosis not present

## 2016-03-23 DIAGNOSIS — L720 Epidermal cyst: Secondary | ICD-10-CM | POA: Diagnosis not present

## 2016-03-23 DIAGNOSIS — D2230 Melanocytic nevi of unspecified part of face: Secondary | ICD-10-CM | POA: Diagnosis not present

## 2016-03-23 DIAGNOSIS — Z23 Encounter for immunization: Secondary | ICD-10-CM | POA: Diagnosis not present

## 2016-03-23 DIAGNOSIS — L820 Inflamed seborrheic keratosis: Secondary | ICD-10-CM | POA: Diagnosis not present

## 2016-04-30 ENCOUNTER — Encounter: Payer: Self-pay | Admitting: Cardiovascular Disease

## 2016-04-30 ENCOUNTER — Ambulatory Visit (INDEPENDENT_AMBULATORY_CARE_PROVIDER_SITE_OTHER): Payer: Medicare Other | Admitting: Cardiovascular Disease

## 2016-04-30 VITALS — BP 123/74 | HR 75 | Ht 64.0 in | Wt 165.4 lb

## 2016-04-30 DIAGNOSIS — I10 Essential (primary) hypertension: Secondary | ICD-10-CM | POA: Diagnosis not present

## 2016-04-30 DIAGNOSIS — I712 Thoracic aortic aneurysm, without rupture, unspecified: Secondary | ICD-10-CM

## 2016-04-30 DIAGNOSIS — E1159 Type 2 diabetes mellitus with other circulatory complications: Secondary | ICD-10-CM

## 2016-04-30 DIAGNOSIS — I2510 Atherosclerotic heart disease of native coronary artery without angina pectoris: Secondary | ICD-10-CM

## 2016-04-30 DIAGNOSIS — I5042 Chronic combined systolic (congestive) and diastolic (congestive) heart failure: Secondary | ICD-10-CM | POA: Diagnosis not present

## 2016-04-30 DIAGNOSIS — Z01812 Encounter for preprocedural laboratory examination: Secondary | ICD-10-CM | POA: Diagnosis not present

## 2016-04-30 DIAGNOSIS — E7800 Pure hypercholesterolemia, unspecified: Secondary | ICD-10-CM | POA: Diagnosis not present

## 2016-04-30 NOTE — Patient Instructions (Addendum)
Dr Sallyanne Kuster recommends that you schedule a follow-up appointment in 1 year. You will receive a reminder letter in the mail two months in advance. If you don't receive a letter, please call our office to schedule the follow-up appointment.  If you need a refill on your cardiac medications before your next appointment, please call your pharmacy.   Non-Cardiac CT Angiography (CTA), is a special type of CT scan that uses a computer to produce multi-dimensional views of major blood vessels throughout the body. In CT angiography, a contrast material is injected through an IV to help visualize the blood vessels.  You will have to have some blood work done no more than 1 week before the test. HOLD Metformin on the day of the procedure.

## 2016-04-30 NOTE — Progress Notes (Signed)
Patient ID: Karen [name], female   DOB: 12/09/1928, 83 y.o.   MRN: [id number]    Cardiology Office Note    Date:  04/30/2016   ID:  Karen [name], DOB 11/03/1928, MRN [id number]  PCP:  Thressa Sheller, MD  Cardiologist:   Sanda Klein, MD   Chief Complaint  Patient presents with  . Follow-up    Chronic combined systolic and diastolic congestive heart failure     History of Present Illness:  Karen [name] is a 83 y.o. female with coronary artery disease, history of chronic diastolic heart failure (improved after treatment of coronary disease), hypertension and an ascending aortic aneurysm last measured at 4.7 cm in maximum diameter.  She has never had angina pectoris but presented with dyspnea. She does not have any issues of dyspnea at this time. Reports feeling great. She has been exercising less since she has been afraid of public places and the risk of influenza. Her weight has increased a little. She is sleeping well. She seems to be tolerating the statin without side effects at this point.  On October 11 2013 she had 2.25 x 20 mm Promus to 90% LAD- mid to distal and 3.5 x 12 mm Promus to 80% LCx-mid lesions. She had presented with profound fatigue and exertional dyspnea, but without angina. Symptoms did not resolve despite aggressive antiHTNive and antianginal pharmacological therapy. LVEF was 50%. Symptoms improved rapidly after placement of the stents. She has treated HTN and hyperlipidemia and mild DM.   Past Medical History:  Diagnosis Date  . Arthritis   . Constipation   . Coronary artery disease 04/2013   Three-vessel disease, initial medical therapy then stenting of the LAD and CFX 10/2013  . Diabetes mellitus    TYPE 2  . Fibromyalgia   . H/O: gout   . Hx: UTI (urinary tract infection)   . Hypertension     Past Surgical History:  Procedure Laterality Date  . APPENDECTOMY  1951  . [place]  . CARDIAC CATHETERIZATION  04/2013   Left main  30-40%, prox LAD 40%, mid/distal LAD 90%, CFX 70%, OM 3 90%, RCA 40-50%, PDA 20-80%, EF 50%   . CHOLECYSTECTOMY  1951  . Closed reduction of Left Total Hip  2008   x 3  . CORONARY STENT PLACEMENT  10/11/2013   2.25 x 20 mm Promus DES to mid/distal LAD & 3.5 x 12 mm Promus DES Mid CX         DR Martinique  . [place]  . JOINT REPLACEMENT  left hip-2007, left knee-2000, right knee-1997   Left Hip, Bilateral Knee replacements  . KNEE SURGERY  1997/2000  . LEFT HEART CATHETERIZATION WITH CORONARY ANGIOGRAM N/A 04/25/2013   Procedure: LEFT HEART CATHETERIZATION WITH CORONARY ANGIOGRAM;  Surgeon: Sanda Klein, MD;  Location: [place] CATH LAB;  Service: Cardiovascular;  Laterality: N/A;  . PERCUTANEOUS CORONARY STENT INTERVENTION (PCI-S) N/A 10/11/2013   Procedure: PERCUTANEOUS CORONARY STENT INTERVENTION (PCI-S);  Surgeon: Peter M Martinique, MD;  Location: [facility] CATH LAB;  Service: Cardiovascular;  Laterality: N/A;  . REPLACEMENT TOTAL HIP W/  RESURFACING IMPLANTS  9833,8250  . Right Foot Surgery  2004   x 2  . Salivary Gland Stone Extraction    . SPINAL FUSION  2006    Current Medications: Outpatient Medications Prior to Visit  Medication Sig Dispense Refill  . amLODipine (NORVASC) 5 MG tablet Take 1 tablet (5 mg total) by mouth daily. 90 tablet 3  .  aspirin 81 MG tablet Take 1 tablet (81 mg total) by mouth daily. 90 tablet 3  . atorvastatin (LIPITOR) 40 MG tablet Take 1 tablet (40 mg total) by mouth daily. 30 tablet 11  . Calcium Carbonate-Vitamin D (CALCIUM 600+D) 600-400 MG-UNIT per tablet Take 1 tablet by mouth daily.    . Coenzyme Q10 (CO Q 10 PO) Take 300 mg by mouth daily.    . furosemide (LASIX) 40 MG tablet Take 2 tablets (80 mg total) by mouth daily. 180 tablet 3  . JANUVIA 50 MG tablet Take 1 tablet by mouth daily.    . meloxicam (MOBIC) 7.5 MG tablet Take 7.5 mg by mouth daily.    . metFORMIN (GLUCOPHAGE) 1000 MG tablet Take 1 tablet (1,000 mg total) by mouth 2 (two) times daily  with a meal. Hold for 2 days, restart on 10/14/2013 60 tablet 0  . metoprolol succinate (TOPROL-XL) 50 MG 24 hr tablet Take 50 mg by mouth daily. Take with or immediately following a meal.    . mirtazapine (REMERON) 15 MG tablet Take 15 mg by mouth at bedtime.    . Multiple Vitamin (MULTIVITAMIN) tablet Take 1 tablet by mouth daily.    Vladimir Faster Glycol-Propyl Glycol (SYSTANE OP) Place 1 drop into both eyes daily as needed (dry eyes).    . potassium chloride SA (K-DUR,KLOR-CON) 20 MEQ tablet Take 1 tablet (20 mEq total) by mouth 2 (two) times daily. 180 tablet 3  . QUEtiapine (SEROQUEL) 200 MG tablet Take 200 mg by mouth at bedtime.    . valsartan (DIOVAN) 320 MG tablet Take 1 tablet (320 mg total) by mouth daily. 90 tablet 3   No facility-administered medications prior to visit.      Allergies:   Lisinopril; Pneumococcal vaccines; Codeine; Morphine and related; and Ultram [tramadol hcl]   Social History   Social History  . Marital status: Widowed    Spouse name: N/A  . Number of children: N/A  . Years of education: N/A   Social History Main Topics  . Smoking status: Never Smoker  . Smokeless tobacco: Never Used  . Alcohol use No  . Drug use: No  . Sexual activity: No   Other Topics Concern  . None   Social History Narrative  . None     Family History:  The patient's family history includes Cancer in her brother and mother; Heart attack in her brother and father; Stroke in her father.   ROS:   Please see the history of present illness.    ROS All other systems reviewed and are negative.   PHYSICAL EXAM:   VS:  BP 123/74   Pulse 75   Ht 5\' 4"  (1.626 m)   Wt 75 kg (165 lb 6.4 oz)   LMP 05/10/1998   BMI 28.39 kg/m    GEN: Well nourished, well developed, in no acute distress  HEENT: normal  Neck: no JVD, carotid bruits, or masses Cardiac: RRR; no murmurs, rubs, or gallops,no edema  Respiratory:  clear to auscultation bilaterally, normal work of breathing GI: soft,  nontender, nondistended, + BS MS: no deformity or atrophy  Skin: warm and dry, no rash Neuro:  Alert and Oriented x 3, Strength and sensation are intact Psych: euthymic mood, full affect  Wt Readings from Last 3 Encounters:  04/30/16 75 kg (165 lb 6.4 oz)  10/22/15 72.8 kg (160 lb 9.6 oz)  07/14/15 75.4 kg (166 lb 3.2 oz)      Studies/Labs Reviewed:  EKG:  EKG is ordered today.  The ekg ordered today demonstrates Normal sinus rhythm, nonspecific intraventricular conduction delay, Tiny Q waves in leads 3 and aVF, but not in lead 2  Recent Labs: 03/02/2016  Sodium 143, creatinine 1.0, glucose 199, hemoglobin A1c 6.9% (up from 6.0% 3 months earlier) Total cholesterol 169, triglycerides 119, HDL 56, LDL 89 (in April 2017 HDL 65, LDL 69, total cholesterol 153)  ASSESSMENT:    1. Chronic combined systolic and diastolic congestive heart failure ([place])   2. Coronary artery disease involving native coronary artery of native heart without angina pectoris   3. Thoracic aortic aneurysm without rupture ([place])   4. Essential hypertension   5. Pure hypercholesterolemia   6. Type 2 diabetes mellitus with other circulatory complication, without long-term current use of insulin ([place])   7. Pre-procedure lab exam      PLAN:  In order of problems listed above:  1. CHF: Have not reevaluated left ventricular ejection fraction since her revascularization procedure. It is possible that her wall motion and LVEF have returned to normal. Appears well compensated, NYHA functional class I, euvolemicOn a relatively low dose of diuretic. 2. CAD: Currently asymptomatic roughly 2 years after placement of stents in the LAD and left circumflex arteries. She never had angina, but presented with shortness of breath/exacerbation of diastolic heart failure. Currently she does not have symptoms to suggest recurrent stenoses 3. TAAA: Asymptomatic, recent slight enlargement, plan to reimage in May 2018. Consider  surgical referral if the aneurysm exceeds 5 cm in diameter. Continue careful management of hypertension, including use of beta blocker. 4. HTN: Blood pressure control is good. 5. HLP: He was to be tolerating atorvastatin without side effects at this point. All her Toprol-XL parameters have deteriorated slightly with a particularly noticeable decrease in HDL and increase in glycosylated hemoglobin, I suspect the changes are related to less physical activity and she is encouraged to return to regular exercise. 6. DM: Control is acceptable, but not as good as last year.    Medication Adjustments/Labs and Tests Ordered: Current medicines are reviewed at length with the patient today.  Concerns regarding medicines are outlined above.  Medication changes, Labs and Tests ordered today are listed in the Patient Instructions below. Patient Instructions  Dr Sallyanne Kuster recommends that you schedule a follow-up appointment in 1 year. You will receive a reminder letter in the mail two months in advance. If you don't receive a letter, please call our office to schedule the follow-up appointment.  If you need a refill on your cardiac medications before your next appointment, please call your pharmacy.   Non-Cardiac CT Angiography (CTA), is a special type of CT scan that uses a computer to produce multi-dimensional views of major blood vessels throughout the body. In CT angiography, a contrast material is injected through an IV to help visualize the blood vessels.  You will have to have some blood work done no more than 1 week before the test. HOLD Metformin on the day of the procedure.    Signed, Sanda Klein, MD  04/30/2016 2:27 PM    [facility] [place], [place], [place]  [address] Phone: [phone]; Fax: [phone]

## 2016-07-02 DIAGNOSIS — E7800 Pure hypercholesterolemia, unspecified: Secondary | ICD-10-CM | POA: Diagnosis not present

## 2016-07-02 DIAGNOSIS — E1165 Type 2 diabetes mellitus with hyperglycemia: Secondary | ICD-10-CM | POA: Diagnosis not present

## 2016-07-08 DIAGNOSIS — Z01812 Encounter for preprocedural laboratory examination: Secondary | ICD-10-CM | POA: Diagnosis not present

## 2016-07-08 DIAGNOSIS — I712 Thoracic aortic aneurysm, without rupture: Secondary | ICD-10-CM | POA: Diagnosis not present

## 2016-07-09 LAB — BASIC METABOLIC PANEL
BUN: 23 mg/dL (ref 7–25)
CHLORIDE: 102 mmol/L (ref 98–110)
CO2: 26 mmol/L (ref 20–31)
Calcium: 9.8 mg/dL (ref 8.6–10.4)
Creat: 1.27 mg/dL — ABNORMAL HIGH (ref 0.60–0.88)
GLUCOSE: 177 mg/dL — AB (ref 65–99)
POTASSIUM: 4.2 mmol/L (ref 3.5–5.3)
Sodium: 140 mmol/L (ref 135–146)

## 2016-07-13 ENCOUNTER — Ambulatory Visit (INDEPENDENT_AMBULATORY_CARE_PROVIDER_SITE_OTHER)
Admission: RE | Admit: 2016-07-13 | Discharge: 2016-07-13 | Disposition: A | Discharge status: Home / Self Care | Payer: Medicare Other | Source: Ambulatory Visit | Attending: Cardiovascular Disease | Admitting: Cardiovascular Disease

## 2016-07-13 DIAGNOSIS — I712 Thoracic aortic aneurysm, without rupture, unspecified: Secondary | ICD-10-CM

## 2016-07-13 MED ORDER — IOPAMIDOL (ISOVUE-370) INJECTION 76%
100.0000 mL | Freq: Once | INTRAVENOUS | Status: AC | PRN
  Administered 2016-07-13: 75 mL via INTRAVENOUS

## 2016-07-20 DIAGNOSIS — E7800 Pure hypercholesterolemia, unspecified: Secondary | ICD-10-CM | POA: Diagnosis not present

## 2016-07-20 DIAGNOSIS — E1165 Type 2 diabetes mellitus with hyperglycemia: Secondary | ICD-10-CM | POA: Diagnosis not present

## 2016-07-20 DIAGNOSIS — I10 Essential (primary) hypertension: Secondary | ICD-10-CM | POA: Diagnosis not present

## 2016-08-03 ENCOUNTER — Telehealth: Payer: Self-pay | Admitting: Cardiovascular Disease

## 2016-08-03 DIAGNOSIS — I5042 Chronic combined systolic (congestive) and diastolic (congestive) heart failure: Secondary | ICD-10-CM

## 2016-08-03 DIAGNOSIS — I10 Essential (primary) hypertension: Secondary | ICD-10-CM

## 2016-08-03 NOTE — Telephone Encounter (Signed)
PT called to confirm oders are in there for her lab work July 2nd

## 2016-08-03 NOTE — Telephone Encounter (Signed)
Returned call to patient.She stated she will be having bmet done 08/09/16.Stated she would like lab order left at front desk.Bmet order left at [employer] office front desk.

## 2016-08-09 DIAGNOSIS — I10 Essential (primary) hypertension: Secondary | ICD-10-CM | POA: Diagnosis not present

## 2016-08-09 DIAGNOSIS — I5042 Chronic combined systolic (congestive) and diastolic (congestive) heart failure: Secondary | ICD-10-CM | POA: Diagnosis not present

## 2016-08-09 LAB — BASIC METABOLIC PANEL
BUN / CREAT RATIO: 21 (ref 12–28)
BUN: 23 mg/dL (ref 8–27)
CO2: 23 mmol/L (ref 20–29)
Calcium: 9.6 mg/dL (ref 8.7–10.3)
Chloride: 99 mmol/L (ref 96–106)
Creatinine, Ser: 1.1 mg/dL — ABNORMAL HIGH (ref 0.57–1.00)
GFR calc Af Amer: 53 mL/min/{1.73_m2} — ABNORMAL LOW (ref >59)
GFR calc non Af Amer: 46 mL/min/{1.73_m2} — ABNORMAL LOW (ref >59)
GLUCOSE: 258 mg/dL — AB (ref 65–99)
Potassium: 3.9 mmol/L (ref 3.5–5.2)
SODIUM: 140 mmol/L (ref 134–144)

## 2016-11-29 DIAGNOSIS — Z1231 Encounter for screening mammogram for malignant neoplasm of breast: Secondary | ICD-10-CM | POA: Diagnosis not present

## 2016-12-21 DIAGNOSIS — I10 Essential (primary) hypertension: Secondary | ICD-10-CM | POA: Diagnosis not present

## 2016-12-21 DIAGNOSIS — E1165 Type 2 diabetes mellitus with hyperglycemia: Secondary | ICD-10-CM | POA: Diagnosis not present

## 2016-12-21 DIAGNOSIS — E7800 Pure hypercholesterolemia, unspecified: Secondary | ICD-10-CM | POA: Diagnosis not present

## 2016-12-21 DIAGNOSIS — N390 Urinary tract infection, site not specified: Secondary | ICD-10-CM | POA: Diagnosis not present

## 2016-12-28 DIAGNOSIS — I10 Essential (primary) hypertension: Secondary | ICD-10-CM | POA: Diagnosis not present

## 2016-12-28 DIAGNOSIS — M150 Primary generalized (osteo)arthritis: Secondary | ICD-10-CM | POA: Diagnosis not present

## 2016-12-28 DIAGNOSIS — Z23 Encounter for immunization: Secondary | ICD-10-CM | POA: Diagnosis not present

## 2016-12-28 DIAGNOSIS — E1165 Type 2 diabetes mellitus with hyperglycemia: Secondary | ICD-10-CM | POA: Diagnosis not present

## 2016-12-28 DIAGNOSIS — E7800 Pure hypercholesterolemia, unspecified: Secondary | ICD-10-CM | POA: Diagnosis not present

## 2016-12-28 DIAGNOSIS — Z79899 Other long term (current) drug therapy: Secondary | ICD-10-CM | POA: Diagnosis not present

## 2017-03-29 DIAGNOSIS — D2230 Melanocytic nevi of unspecified part of face: Secondary | ICD-10-CM | POA: Diagnosis not present

## 2017-03-29 DIAGNOSIS — L821 Other seborrheic keratosis: Secondary | ICD-10-CM | POA: Diagnosis not present

## 2017-03-29 DIAGNOSIS — L723 Sebaceous cyst: Secondary | ICD-10-CM | POA: Diagnosis not present

## 2017-03-29 DIAGNOSIS — L814 Other melanin hyperpigmentation: Secondary | ICD-10-CM | POA: Diagnosis not present

## 2017-03-29 DIAGNOSIS — Z86018 Personal history of other benign neoplasm: Secondary | ICD-10-CM | POA: Diagnosis not present

## 2017-03-29 DIAGNOSIS — D225 Melanocytic nevi of trunk: Secondary | ICD-10-CM | POA: Diagnosis not present

## 2017-03-29 DIAGNOSIS — Z23 Encounter for immunization: Secondary | ICD-10-CM | POA: Diagnosis not present

## 2017-03-29 DIAGNOSIS — L720 Epidermal cyst: Secondary | ICD-10-CM | POA: Diagnosis not present

## 2017-06-07 ENCOUNTER — Ambulatory Visit (INDEPENDENT_AMBULATORY_CARE_PROVIDER_SITE_OTHER): Payer: Medicare Other | Admitting: Cardiovascular Disease

## 2017-06-07 ENCOUNTER — Encounter: Payer: Self-pay | Admitting: Cardiovascular Disease

## 2017-06-07 VITALS — BP 132/72 | HR 69 | Ht 64.0 in | Wt 143.4 lb

## 2017-06-07 DIAGNOSIS — I2510 Atherosclerotic heart disease of native coronary artery without angina pectoris: Secondary | ICD-10-CM

## 2017-06-07 DIAGNOSIS — I255 Ischemic cardiomyopathy: Secondary | ICD-10-CM

## 2017-06-07 DIAGNOSIS — I5042 Chronic combined systolic (congestive) and diastolic (congestive) heart failure: Secondary | ICD-10-CM | POA: Diagnosis not present

## 2017-06-07 DIAGNOSIS — I10 Essential (primary) hypertension: Secondary | ICD-10-CM

## 2017-06-07 DIAGNOSIS — E7800 Pure hypercholesterolemia, unspecified: Secondary | ICD-10-CM

## 2017-06-07 DIAGNOSIS — I712 Thoracic aortic aneurysm, without rupture, unspecified: Secondary | ICD-10-CM

## 2017-06-07 DIAGNOSIS — E1159 Type 2 diabetes mellitus with other circulatory complications: Secondary | ICD-10-CM

## 2017-06-07 MED ORDER — FUROSEMIDE 40 MG PO TABS: 40.0000 mg | ORAL_TABLET | Freq: Every day | ORAL | 3 refills | Status: DC

## 2017-06-07 NOTE — Progress Notes (Signed)
Patient ID: Karen [name], female   DOB: 03-01-1928, 84 y.o.   MRN: [id number]    Cardiology Office Note    Date:  06/07/2017   ID:  Karen [name], DOB [birth year], MRN [id number]  PCP:  Thressa Sheller, MD  Cardiologist:   Sanda Klein, MD   Chief Complaint  Patient presents with  . Follow-up    CAD, Asc Ao aneurysm    History of Present Illness:  Karen [name] is a 84 y.o. female with coronary artery disease, history of chronic diastolic heart failure (improved after treatment of coronary disease), hypertension and an ascending aortic aneurysm last measured  (June 2018) at 4.7 cm in maximum diameter.  She feels very well.  She continues to live independently, drives, takes care of all her own housework.  She met, even exceeded her goal of 20 pounds weight loss since last year. denies exertional dyspnea and has never complained of angina.  Has not had dizziness or syncope.  Her typical blood pressure at home is in the low 130s/low 70s.  Even though she has alternated the dose of diuretic between 40 mg and 80 mg every other day, she has not developed edema or dyspnea.  She denies palpitations.  On October 11 2013 she had 2.25 x 20 mm Promus to 90% L,AD- mid to distal and 3.5 x 12 mm Promus to 80% LCx-mid lesions. She had presented with profound fatigue and exertional dyspnea, but without angina. Symptoms did not resolve despite aggressive antiHTNive and antianginal pharmacological therapy. LVEF was 50%. Symptoms improved rapidly after placement of the stents. She has treated HTN and hyperlipidemia and mild DM.   Past Medical History:  Diagnosis Date  . Arthritis   . Constipation   . Coronary artery disease 04/2013   Three-vessel disease, initial medical therapy then stenting of the LAD and CFX 10/2013  . Diabetes mellitus    TYPE 2  . Fibromyalgia   . H/O: gout   . Hx: UTI (urinary tract infection)   . Hypertension     Past Surgical History:  Procedure Laterality  Date  . APPENDECTOMY  1951  . [place]  . CARDIAC CATHETERIZATION  04/2013   Left main 30-40%, prox LAD 40%, mid/distal LAD 90%, CFX 70%, OM 3 90%, RCA 40-50%, PDA 20-80%, EF 50%   . CHOLECYSTECTOMY  1951  . Closed reduction of Left Total Hip  2008   x 3  . CORONARY STENT PLACEMENT  10/11/2013   2.25 x 20 mm Promus DES to mid/distal LAD & 3.5 x 12 mm Promus DES Mid CX         DR [place]  . [place]  . JOINT REPLACEMENT  left hip-2007, left knee-2000, right knee-1997   Left Hip, Bilateral Knee replacements  . KNEE SURGERY  1997/2000  . LEFT HEART CATHETERIZATION WITH CORONARY ANGIOGRAM N/A 04/25/2013   Procedure: LEFT HEART CATHETERIZATION WITH CORONARY ANGIOGRAM;  Surgeon: Sanda Klein, MD;  Location: [facility] CATH LAB;  Service: Cardiovascular;  Laterality: N/A;  . PERCUTANEOUS CORONARY STENT INTERVENTION (PCI-S) N/A 10/11/2013   Procedure: PERCUTANEOUS CORONARY STENT INTERVENTION (PCI-S);  Surgeon: Peter M Martinique, MD;  Location: [facility] CATH LAB;  Service: Cardiovascular;  Laterality: N/A;  . REPLACEMENT TOTAL HIP W/  RESURFACING IMPLANTS  2376,2831  . Right Foot Surgery  2004   x 2  . Salivary Gland Stone Extraction    . SPINAL FUSION  2006    Current Medications: Outpatient Medications Prior  to Visit  Medication Sig Dispense Refill  . amLODipine (NORVASC) 5 MG tablet Take 1 tablet (5 mg total) by mouth daily. 90 tablet 3  . aspirin 81 MG tablet Take 1 tablet (81 mg total) by mouth daily. 90 tablet 3  . atorvastatin (LIPITOR) 40 MG tablet Take 1 tablet (40 mg total) by mouth daily. 30 tablet 11  . Calcium Carbonate-Vitamin D (CALCIUM 600+D) 600-400 MG-UNIT per tablet Take 1 tablet by mouth daily.    . Coenzyme Q10 (CO Q 10 PO) Take 300 mg by mouth daily.    Marland Kitchen JANUVIA 50 MG tablet Take 1 tablet by mouth daily.    . meloxicam (MOBIC) 7.5 MG tablet Take 7.5 mg by mouth daily.    . metFORMIN (GLUCOPHAGE) 1000 MG tablet Take 1 tablet (1,000 mg total) by mouth 2 (two) times  daily with a meal. Hold for 2 days, restart on 10/14/2013 60 tablet 0  . metoprolol succinate (TOPROL-XL) 50 MG 24 hr tablet Take 50 mg by mouth daily. Take with or immediately following a meal.    . mirtazapine (REMERON) 15 MG tablet Take 15 mg by mouth at bedtime.    . Multiple Vitamin (MULTIVITAMIN) tablet Take 1 tablet by mouth daily.    Vladimir Faster Glycol-Propyl Glycol (SYSTANE OP) Place 1 drop into both eyes daily as needed (dry eyes).    . potassium chloride SA (K-DUR,KLOR-CON) 20 MEQ tablet Take 1 tablet (20 mEq total) by mouth 2 (two) times daily. 180 tablet 3  . QUEtiapine (SEROQUEL) 200 MG tablet Take 200 mg by mouth at bedtime.    . valsartan (DIOVAN) 320 MG tablet Take 1 tablet (320 mg total) by mouth daily. 90 tablet 3  . furosemide (LASIX) 40 MG tablet Take 2 tablets (80 mg total) by mouth daily. 180 tablet 3   No facility-administered medications prior to visit.      Allergies:   Lisinopril; Pneumococcal vaccines; Codeine; Morphine and related; and Ultram [tramadol hcl]   Social History   Socioeconomic History  . Marital status: Widowed    Spouse name: Not on file  . Number of children: Not on file  . Years of education: Not on file  . Highest education level: Not on file  Occupational History  . Not on file  Social Needs  . Financial resource strain: Not on file  . Food insecurity:    Worry: Not on file    Inability: Not on file  . Transportation needs:    Medical: Not on file    Non-medical: Not on file  Tobacco Use  . Smoking status: Never Smoker  . Smokeless tobacco: Never Used  Substance and Sexual Activity  . Alcohol use: No  . Drug use: No  . Sexual activity: Never  Lifestyle  . Physical activity:    Days per week: Not on file    Minutes per session: Not on file  . Stress: Not on file  Relationships  . Social connections:    Talks on phone: Not on file    Gets together: Not on file    Attends religious service: Not on file    Active member of  club or organization: Not on file    Attends meetings of clubs or organizations: Not on file    Relationship status: Not on file  Other Topics Concern  . Not on file  Social History Narrative  . Not on file     Family History:  The patient's family history includes  Cancer in her brother and mother; Heart attack in her brother and father; Stroke in her father.   ROS:   Please see the history of present illness.    ROS All other systems reviewed and are negative.   PHYSICAL EXAM:   VS:  BP 132/72   Pulse 69   Ht 5' 4"  (1.626 m)   Wt 143 lb 6.4 oz (65 kg)   LMP 05/10/1998   BMI 24.61 kg/m    GEN: Well nourished, well developed, in no acute distress  HEENT: normal  Neck: no JVD, carotid bruits, or masses Cardiac: RRR; no murmurs, rubs, or gallops,no edema  Respiratory:  clear to auscultation bilaterally, normal work of breathing GI: soft, nontender, nondistended, + BS MS: no deformity or atrophy  Skin: warm and dry, no rash Neuro:  Alert and Oriented x 3, Strength and sensation are intact Psych: euthymic mood, full affect  Wt Readings from Last 3 Encounters:  06/07/17 143 lb 6.4 oz (65 kg)  04/30/16 165 lb 6.4 oz (75 kg)  10/22/15 160 lb 9.6 oz (72.8 kg)      Studies/Labs Reviewed:   EKG:  EKG is ordered today.  The ekg ordered today demonstrates Normal sinus rhythm, nonspecific intraventricular conduction delay, Tiny Q waves in leads 3 and aVF, but not in lead 2  Recent Labs: 03/02/2016  Sodium 143, creatinine 1.0, glucose 199, hemoglobin A1c 6.9% (up from 6.0% 3 months earlier) Total cholesterol 169, triglycerides 119, HDL 56, LDL 89 (in April 2017 HDL 65, LDL 69, total cholesterol 153)  ASSESSMENT:    1. Chronic combined systolic and diastolic congestive heart failure (HCC)   2. Cardiomyopathy, mixed hypertensive and ischemic etiology   3. Coronary artery disease involving native coronary artery of native heart without angina pectoris   4. Thoracic aortic  aneurysm without rupture ([place])   5. Essential hypertension   6. Hypercholesterolemia   7. Type 2 diabetes mellitus with other circulatory complication, without long-term current use of insulin (HCC)      PLAN:  In order of problems listed above:  1. CHF: Clinically euvolemic, NYHA functional class I.  We have not reevaluated left ventricular ejection fraction since her revascularization procedure. It is possible that her wall motion and LVEF have returned to normal.  We will cut back the dose of diuretic further to only 40 mg once daily. 2. CAD: She never had angina pectoris but presented with shortness of breath symptoms of LAD and left circumflex coronary stenosis.  Currently asymptomatic.  3 years have passed since her revascularization procedure. 3. TAAA: Remains asymptomatic but has shown a pattern of slight enlargement over time.  Recheck CT angiogram. 4. HTN: Blood pressure control is good.  Continue to use beta-blockers and ARB as part of her antihypertensive regimen because of the aneurysm. 5. HLP: She has lost 24 pounds since her appointment last year.  Suspect her lipid profile will have improved.  She has labs scheduled with a Vassie Moment at [facility] on May 21.  She is on a highly active statin. 6. DM: She reports that her spotcheck blood glucose levels have been higher after cutting back on the dose of Januvia.  She asked whether she should increase the dose again, but I advised that she should wait until she has her repeat hemoglobin A1c checked later in May.    Medication Adjustments/Labs and Tests Ordered: Current medicines are reviewed at length with the patient today.  Concerns regarding medicines are outlined above.  Medication changes, Labs and Tests ordered today are listed in the Patient Instructions below. Patient Instructions  Medication Instructions: Dr Sallyanne Kuster has recommended making the following medication changes: 1. DECREASE Furosemide to 40 mg  daily  Labwork: Your physician recommends that you return for lab work prior to your CT.  Testing/Procedures: 1. CT Angiogram Aorta This will be performed at our [facility] location [place], [place] [address] [phone]  Follow-up: Dr Sallyanne Kuster recommends that you schedule a follow-up appointment in 12 months. You will receive a reminder letter in the mail two months in advance. If you don't receive a letter, please call our office to schedule the follow-up appointment.  If you need a refill on your cardiac medications before your next appointment, please call your pharmacy.    Signed, Sanda Klein, MD  06/07/2017 3:06 PM    [facility] [place], [place], [place]  [address] Phone: [phone]; Fax: [phone]

## 2017-06-07 NOTE — Patient Instructions (Signed)
Medication Instructions: Dr Sallyanne Kuster has recommended making the following medication changes: 1. DECREASE Furosemide to 40 mg daily  Labwork: Your physician recommends that you return for lab work prior to your CT.  Testing/Procedures: 1. CT Angiogram Aorta This will be performed at our [facility] location [place], [place] [address] [phone]  Follow-up: Dr Sallyanne Kuster recommends that you schedule a follow-up appointment in 12 months. You will receive a reminder letter in the mail two months in advance. If you don't receive a letter, please call our office to schedule the follow-up appointment.  If you need a refill on your cardiac medications before your next appointment, please call your pharmacy.

## 2017-06-28 DIAGNOSIS — E7800 Pure hypercholesterolemia, unspecified: Secondary | ICD-10-CM | POA: Diagnosis not present

## 2017-06-28 DIAGNOSIS — E559 Vitamin D deficiency, unspecified: Secondary | ICD-10-CM | POA: Diagnosis not present

## 2017-06-28 DIAGNOSIS — E039 Hypothyroidism, unspecified: Secondary | ICD-10-CM | POA: Diagnosis not present

## 2017-06-28 DIAGNOSIS — I10 Essential (primary) hypertension: Secondary | ICD-10-CM | POA: Diagnosis not present

## 2017-06-28 DIAGNOSIS — N390 Urinary tract infection, site not specified: Secondary | ICD-10-CM | POA: Diagnosis not present

## 2017-06-28 DIAGNOSIS — E1165 Type 2 diabetes mellitus with hyperglycemia: Secondary | ICD-10-CM | POA: Diagnosis not present

## 2017-06-28 DIAGNOSIS — Z0000 Encounter for general adult medical examination without abnormal findings: Secondary | ICD-10-CM | POA: Diagnosis not present

## 2017-07-05 DIAGNOSIS — I509 Heart failure, unspecified: Secondary | ICD-10-CM | POA: Diagnosis not present

## 2017-07-05 DIAGNOSIS — I714 Abdominal aortic aneurysm, without rupture: Secondary | ICD-10-CM | POA: Diagnosis not present

## 2017-07-05 DIAGNOSIS — Z0000 Encounter for general adult medical examination without abnormal findings: Secondary | ICD-10-CM | POA: Diagnosis not present

## 2017-07-05 DIAGNOSIS — R809 Proteinuria, unspecified: Secondary | ICD-10-CM | POA: Diagnosis not present

## 2017-07-05 DIAGNOSIS — Z780 Asymptomatic menopausal state: Secondary | ICD-10-CM | POA: Diagnosis not present

## 2017-07-05 DIAGNOSIS — E1165 Type 2 diabetes mellitus with hyperglycemia: Secondary | ICD-10-CM | POA: Diagnosis not present

## 2017-07-05 DIAGNOSIS — E7800 Pure hypercholesterolemia, unspecified: Secondary | ICD-10-CM | POA: Diagnosis not present

## 2017-07-05 DIAGNOSIS — I10 Essential (primary) hypertension: Secondary | ICD-10-CM | POA: Diagnosis not present

## 2017-07-05 DIAGNOSIS — G4700 Insomnia, unspecified: Secondary | ICD-10-CM | POA: Diagnosis not present

## 2017-07-05 DIAGNOSIS — Z1212 Encounter for screening for malignant neoplasm of rectum: Secondary | ICD-10-CM | POA: Diagnosis not present

## 2017-07-15 ENCOUNTER — Ambulatory Visit (INDEPENDENT_AMBULATORY_CARE_PROVIDER_SITE_OTHER)
Admission: RE | Admit: 2017-07-15 | Discharge: 2017-07-15 | Disposition: A | Discharge status: Home / Self Care | Payer: Medicare Other | Source: Ambulatory Visit | Attending: Cardiovascular Disease | Admitting: Cardiovascular Disease

## 2017-07-15 DIAGNOSIS — I712 Thoracic aortic aneurysm, without rupture, unspecified: Secondary | ICD-10-CM

## 2017-07-15 MED ORDER — IOPAMIDOL (ISOVUE-370) INJECTION 76%
100.0000 mL | Freq: Once | INTRAVENOUS | Status: AC | PRN
  Administered 2017-07-15: 75 mL via INTRAVENOUS

## 2017-09-06 DIAGNOSIS — I10 Essential (primary) hypertension: Secondary | ICD-10-CM | POA: Diagnosis not present

## 2017-09-06 DIAGNOSIS — E1165 Type 2 diabetes mellitus with hyperglycemia: Secondary | ICD-10-CM | POA: Diagnosis not present

## 2017-09-13 DIAGNOSIS — E7800 Pure hypercholesterolemia, unspecified: Secondary | ICD-10-CM | POA: Diagnosis not present

## 2017-09-13 DIAGNOSIS — E1165 Type 2 diabetes mellitus with hyperglycemia: Secondary | ICD-10-CM | POA: Diagnosis not present

## 2017-09-13 DIAGNOSIS — I5022 Chronic systolic (congestive) heart failure: Secondary | ICD-10-CM | POA: Diagnosis not present

## 2017-09-13 DIAGNOSIS — N189 Chronic kidney disease, unspecified: Secondary | ICD-10-CM | POA: Diagnosis not present

## 2017-09-13 DIAGNOSIS — I10 Essential (primary) hypertension: Secondary | ICD-10-CM | POA: Diagnosis not present

## 2017-09-13 DIAGNOSIS — M1990 Unspecified osteoarthritis, unspecified site: Secondary | ICD-10-CM | POA: Diagnosis not present

## 2017-09-13 DIAGNOSIS — I714 Abdominal aortic aneurysm, without rupture: Secondary | ICD-10-CM | POA: Diagnosis not present

## 2017-12-05 DIAGNOSIS — Z1231 Encounter for screening mammogram for malignant neoplasm of breast: Secondary | ICD-10-CM | POA: Diagnosis not present

## 2017-12-13 DIAGNOSIS — N189 Chronic kidney disease, unspecified: Secondary | ICD-10-CM | POA: Diagnosis not present

## 2017-12-13 DIAGNOSIS — E1165 Type 2 diabetes mellitus with hyperglycemia: Secondary | ICD-10-CM | POA: Diagnosis not present

## 2017-12-13 DIAGNOSIS — I10 Essential (primary) hypertension: Secondary | ICD-10-CM | POA: Diagnosis not present

## 2017-12-20 DIAGNOSIS — Z23 Encounter for immunization: Secondary | ICD-10-CM | POA: Diagnosis not present

## 2017-12-20 DIAGNOSIS — G4700 Insomnia, unspecified: Secondary | ICD-10-CM | POA: Diagnosis not present

## 2017-12-20 DIAGNOSIS — N183 Chronic kidney disease, stage 3 (moderate): Secondary | ICD-10-CM | POA: Diagnosis not present

## 2017-12-20 DIAGNOSIS — E559 Vitamin D deficiency, unspecified: Secondary | ICD-10-CM | POA: Diagnosis not present

## 2017-12-20 DIAGNOSIS — M1990 Unspecified osteoarthritis, unspecified site: Secondary | ICD-10-CM | POA: Diagnosis not present

## 2017-12-20 DIAGNOSIS — E1165 Type 2 diabetes mellitus with hyperglycemia: Secondary | ICD-10-CM | POA: Diagnosis not present

## 2017-12-20 DIAGNOSIS — I10 Essential (primary) hypertension: Secondary | ICD-10-CM | POA: Diagnosis not present

## 2017-12-20 DIAGNOSIS — I5022 Chronic systolic (congestive) heart failure: Secondary | ICD-10-CM | POA: Diagnosis not present

## 2017-12-20 DIAGNOSIS — I2510 Atherosclerotic heart disease of native coronary artery without angina pectoris: Secondary | ICD-10-CM | POA: Diagnosis not present

## 2018-06-09 ENCOUNTER — Ambulatory Visit: Payer: Self-pay | Admitting: Cardiovascular Disease

## 2018-06-14 DIAGNOSIS — E1165 Type 2 diabetes mellitus with hyperglycemia: Secondary | ICD-10-CM | POA: Diagnosis not present

## 2018-06-14 DIAGNOSIS — E559 Vitamin D deficiency, unspecified: Secondary | ICD-10-CM | POA: Diagnosis not present

## 2018-06-14 DIAGNOSIS — N183 Chronic kidney disease, stage 3 (moderate): Secondary | ICD-10-CM | POA: Diagnosis not present

## 2018-06-21 DIAGNOSIS — M1990 Unspecified osteoarthritis, unspecified site: Secondary | ICD-10-CM | POA: Diagnosis not present

## 2018-06-21 DIAGNOSIS — N183 Chronic kidney disease, stage 3 (moderate): Secondary | ICD-10-CM | POA: Diagnosis not present

## 2018-06-21 DIAGNOSIS — I10 Essential (primary) hypertension: Secondary | ICD-10-CM | POA: Diagnosis not present

## 2018-06-21 DIAGNOSIS — I2510 Atherosclerotic heart disease of native coronary artery without angina pectoris: Secondary | ICD-10-CM | POA: Diagnosis not present

## 2018-06-21 DIAGNOSIS — E559 Vitamin D deficiency, unspecified: Secondary | ICD-10-CM | POA: Diagnosis not present

## 2018-06-21 DIAGNOSIS — E1165 Type 2 diabetes mellitus with hyperglycemia: Secondary | ICD-10-CM | POA: Diagnosis not present

## 2018-06-21 DIAGNOSIS — I5022 Chronic systolic (congestive) heart failure: Secondary | ICD-10-CM | POA: Diagnosis not present

## 2018-06-21 DIAGNOSIS — Z7189 Other specified counseling: Secondary | ICD-10-CM | POA: Diagnosis not present

## 2018-06-21 DIAGNOSIS — G4700 Insomnia, unspecified: Secondary | ICD-10-CM | POA: Diagnosis not present

## 2018-09-13 ENCOUNTER — Ambulatory Visit: Payer: Medicare Other | Admitting: Cardiovascular Disease

## 2018-11-13 ENCOUNTER — Telehealth: Payer: Self-pay | Admitting: Cardiovascular Disease

## 2018-11-13 NOTE — Telephone Encounter (Signed)
New Message:     Pt wanted Dr C to know that she cancel her November appointment. She wanted him to know that she decided not to come in at this time, because of COVID. If all goes well she will see him in the Spring of 2021.

## 2018-11-13 NOTE — Telephone Encounter (Signed)
Noted thank you. A new recall has been placed.

## 2018-11-13 NOTE — Telephone Encounter (Signed)
Routed to primary nurse as Juluis Rainier

## 2018-12-14 DIAGNOSIS — E1165 Type 2 diabetes mellitus with hyperglycemia: Secondary | ICD-10-CM | POA: Diagnosis not present

## 2018-12-14 DIAGNOSIS — E7800 Pure hypercholesterolemia, unspecified: Secondary | ICD-10-CM | POA: Diagnosis not present

## 2018-12-14 DIAGNOSIS — I10 Essential (primary) hypertension: Secondary | ICD-10-CM | POA: Diagnosis not present

## 2018-12-14 DIAGNOSIS — E559 Vitamin D deficiency, unspecified: Secondary | ICD-10-CM | POA: Diagnosis not present

## 2018-12-19 DIAGNOSIS — N183 Chronic kidney disease, stage 3 unspecified: Secondary | ICD-10-CM | POA: Diagnosis not present

## 2018-12-19 DIAGNOSIS — J019 Acute sinusitis, unspecified: Secondary | ICD-10-CM | POA: Diagnosis not present

## 2018-12-19 DIAGNOSIS — I10 Essential (primary) hypertension: Secondary | ICD-10-CM | POA: Diagnosis not present

## 2018-12-19 DIAGNOSIS — E1165 Type 2 diabetes mellitus with hyperglycemia: Secondary | ICD-10-CM | POA: Diagnosis not present

## 2018-12-19 DIAGNOSIS — M1990 Unspecified osteoarthritis, unspecified site: Secondary | ICD-10-CM | POA: Diagnosis not present

## 2018-12-19 DIAGNOSIS — E559 Vitamin D deficiency, unspecified: Secondary | ICD-10-CM | POA: Diagnosis not present

## 2018-12-19 DIAGNOSIS — Z23 Encounter for immunization: Secondary | ICD-10-CM | POA: Diagnosis not present

## 2018-12-19 DIAGNOSIS — Z0000 Encounter for general adult medical examination without abnormal findings: Secondary | ICD-10-CM | POA: Diagnosis not present

## 2018-12-19 DIAGNOSIS — E7800 Pure hypercholesterolemia, unspecified: Secondary | ICD-10-CM | POA: Diagnosis not present

## 2018-12-19 DIAGNOSIS — I5022 Chronic systolic (congestive) heart failure: Secondary | ICD-10-CM | POA: Diagnosis not present

## 2018-12-21 ENCOUNTER — Ambulatory Visit: Payer: Medicare Other | Admitting: Cardiovascular Disease

## 2019-01-03 ENCOUNTER — Other Ambulatory Visit: Payer: Self-pay

## 2019-03-08 ENCOUNTER — Ambulatory Visit: Payer: Medicare Other

## 2019-03-16 ENCOUNTER — Ambulatory Visit: Payer: Medicare Other | Attending: Internal Medicine

## 2019-03-16 DIAGNOSIS — Z23 Encounter for immunization: Secondary | ICD-10-CM

## 2019-03-16 NOTE — Progress Notes (Signed)
   Covid-19 Vaccination Clinic  Name:  Karen [name]    MRN: [id number] DOB: [birth year]  03/16/2019  [name] was observed post Covid-19 immunization for 30 minutes based on pre-vaccination screening without incidence. She was provided with Vaccine Information Sheet and instruction to access the V-Safe system.   [name] was instructed to call 911 with any severe reactions post vaccine: Marland Kitchen Difficulty breathing  . Swelling of your face and throat  . A fast heartbeat  . A bad rash all over your body  . Dizziness and weakness    Immunizations Administered    Name Date Dose VIS Date Route   Pfizer COVID-19 Vaccine 03/16/2019  3:03 PM 0.3 mL 01/19/2019 Intramuscular   Manufacturer: [place]   Lot: [id number]   [place]: [id number]

## 2019-04-10 ENCOUNTER — Ambulatory Visit: Payer: Medicare Other | Attending: Internal Medicine

## 2019-04-10 DIAGNOSIS — Z23 Encounter for immunization: Secondary | ICD-10-CM | POA: Insufficient documentation

## 2019-04-10 NOTE — Progress Notes (Signed)
   Covid-19 Vaccination Clinic  Name:  Karen [name]    MRN: [id number] DOB: Sep 25, 1928  04/10/2019  [name] was observed post Covid-19 immunization for 30 minutes based on pre-vaccination screening without incident. She was provided with Vaccine Information Sheet and instruction to access the V-Safe system.   [name] was instructed to call 911 with any severe reactions post vaccine: Marland Kitchen Difficulty breathing  . Swelling of face and throat  . A fast heartbeat  . A bad rash all over body  . Dizziness and weakness   Immunizations Administered    Name Date Dose VIS Date Route   Pfizer COVID-19 Vaccine 04/10/2019  2:21 PM 0.3 mL 01/19/2019 Intramuscular   Manufacturer: [place]   Lot: [id number]   [place]: [id number]

## 2019-06-12 ENCOUNTER — Encounter: Payer: Self-pay | Admitting: Cardiovascular Disease

## 2019-06-12 DIAGNOSIS — E1165 Type 2 diabetes mellitus with hyperglycemia: Secondary | ICD-10-CM | POA: Diagnosis not present

## 2019-06-12 DIAGNOSIS — E7800 Pure hypercholesterolemia, unspecified: Secondary | ICD-10-CM | POA: Diagnosis not present

## 2019-06-12 DIAGNOSIS — E559 Vitamin D deficiency, unspecified: Secondary | ICD-10-CM | POA: Diagnosis not present

## 2019-06-12 DIAGNOSIS — I10 Essential (primary) hypertension: Secondary | ICD-10-CM | POA: Diagnosis not present

## 2019-06-19 DIAGNOSIS — I2510 Atherosclerotic heart disease of native coronary artery without angina pectoris: Secondary | ICD-10-CM | POA: Diagnosis not present

## 2019-06-19 DIAGNOSIS — E1122 Type 2 diabetes mellitus with diabetic chronic kidney disease: Secondary | ICD-10-CM | POA: Diagnosis not present

## 2019-06-19 DIAGNOSIS — I714 Abdominal aortic aneurysm, without rupture: Secondary | ICD-10-CM | POA: Diagnosis not present

## 2019-06-19 DIAGNOSIS — I10 Essential (primary) hypertension: Secondary | ICD-10-CM | POA: Diagnosis not present

## 2019-06-19 DIAGNOSIS — E559 Vitamin D deficiency, unspecified: Secondary | ICD-10-CM | POA: Diagnosis not present

## 2019-06-19 DIAGNOSIS — E1159 Type 2 diabetes mellitus with other circulatory complications: Secondary | ICD-10-CM | POA: Diagnosis not present

## 2019-06-19 DIAGNOSIS — I5022 Chronic systolic (congestive) heart failure: Secondary | ICD-10-CM | POA: Diagnosis not present

## 2019-06-19 DIAGNOSIS — G4700 Insomnia, unspecified: Secondary | ICD-10-CM | POA: Diagnosis not present

## 2019-07-12 DIAGNOSIS — Z961 Presence of intraocular lens: Secondary | ICD-10-CM | POA: Diagnosis not present

## 2019-07-12 DIAGNOSIS — D3130 Benign neoplasm of unspecified choroid: Secondary | ICD-10-CM | POA: Diagnosis not present

## 2019-07-12 DIAGNOSIS — D3132 Benign neoplasm of left choroid: Secondary | ICD-10-CM | POA: Diagnosis not present

## 2019-07-12 DIAGNOSIS — Z7984 Long term (current) use of oral hypoglycemic drugs: Secondary | ICD-10-CM | POA: Diagnosis not present

## 2019-07-12 DIAGNOSIS — H353131 Nonexudative age-related macular degeneration, bilateral, early dry stage: Secondary | ICD-10-CM | POA: Diagnosis not present

## 2019-07-12 DIAGNOSIS — H04123 Dry eye syndrome of bilateral lacrimal glands: Secondary | ICD-10-CM | POA: Diagnosis not present

## 2019-07-12 DIAGNOSIS — E119 Type 2 diabetes mellitus without complications: Secondary | ICD-10-CM | POA: Diagnosis not present

## 2019-07-17 ENCOUNTER — Ambulatory Visit (INDEPENDENT_AMBULATORY_CARE_PROVIDER_SITE_OTHER): Payer: Medicare Other | Admitting: Cardiovascular Disease

## 2019-07-17 ENCOUNTER — Telehealth: Payer: Self-pay | Admitting: Cardiovascular Disease

## 2019-07-17 ENCOUNTER — Encounter: Payer: Self-pay | Admitting: Cardiovascular Disease

## 2019-07-17 ENCOUNTER — Other Ambulatory Visit: Payer: Self-pay

## 2019-07-17 VITALS — BP 140/90 | HR 80 | Temp 97.2°F | Ht 64.0 in | Wt 150.2 lb

## 2019-07-17 DIAGNOSIS — I2510 Atherosclerotic heart disease of native coronary artery without angina pectoris: Secondary | ICD-10-CM | POA: Diagnosis not present

## 2019-07-17 DIAGNOSIS — E1159 Type 2 diabetes mellitus with other circulatory complications: Secondary | ICD-10-CM | POA: Diagnosis not present

## 2019-07-17 DIAGNOSIS — E7800 Pure hypercholesterolemia, unspecified: Secondary | ICD-10-CM | POA: Diagnosis not present

## 2019-07-17 DIAGNOSIS — I5042 Chronic combined systolic (congestive) and diastolic (congestive) heart failure: Secondary | ICD-10-CM

## 2019-07-17 DIAGNOSIS — I712 Thoracic aortic aneurysm, without rupture, unspecified: Secondary | ICD-10-CM

## 2019-07-17 DIAGNOSIS — I10 Essential (primary) hypertension: Secondary | ICD-10-CM

## 2019-07-17 NOTE — Progress Notes (Signed)
Patient ID: Karen [name], female   DOB: 06-02-1928, 86 y.o.   MRN: [id number]    Cardiology Office Note    Date:  07/22/2019   ID:  Karen [name], DOB 1928/08/18, MRN [id number]  PCP:  [name], DO  Cardiologist:   Sanda Klein, MD   Chief Complaint  Patient presents with   Coronary Artery Disease   Thoracic Aortic Aneurysm   Congestive Heart Failure    History of Present Illness:  Karen [name] is a 86 y.o. female with coronary artery disease, history of chronic diastolic heart failure (improved after treatment of coronary disease), hypertension and an ascending aortic aneurysm last measured  (June 2019) at 4.7 cm in maximum diameter.  She feels great.  She is quite active, continues to drive and do housework.  The patient specifically denies any chest pain at rest exertion, dyspnea at rest or with exertion, orthopnea, paroxysmal nocturnal dyspnea, syncope, palpitations, focal neurological deficits, intermittent claudication, lower extremity edema, unexplained weight gain, cough, hemoptysis or wheezing.  She is experiencing a lot of pain in her right groin and is anticipating the need for right hip surgery.  She has already had bilateral knee replacements and a left hip replacement with Dr. Wynelle Link.  Her blood pressure slightly high today, but she reports that at home it is consistently in the 130s/70s.  Her diabetes control is suboptimal with an A1c recently at 8.8%, but her lipid profile is good with an LDL of 68.  On October 11 2013 she had 2.25 x 20 mm Promus to 90% L,AD- mid to distal and 3.5 x 12 mm Promus to 80% LCx-mid lesions. She had presented with profound fatigue and exertional dyspnea, but without angina. Symptoms did not resolve despite aggressive antiHTNive and antianginal pharmacological therapy. LVEF was 50%. Symptoms improved rapidly after placement of the stents. She has treated HTN and hyperlipidemia and mild DM.   Past Medical History:    Diagnosis Date   Arthritis    Constipation    Coronary artery disease 04/2013   Three-vessel disease, initial medical therapy then stenting of the LAD and CFX 10/2013   Diabetes mellitus    TYPE 2   Fibromyalgia    H/O: gout    Hx: UTI (urinary tract infection)    Hypertension     Past Surgical History:  Procedure Laterality Date   [place] CATHETERIZATION  04/2013   Left main 30-40%, prox LAD 40%, mid/distal LAD 90%, CFX 70%, OM 3 90%, RCA 40-50%, PDA 20-80%, EF 50%    CHOLECYSTECTOMY  1951   Closed reduction of Left Total Hip  2008   x 3   CORONARY STENT PLACEMENT  10/11/2013   2.25 x 20 mm Promus DES to mid/distal LAD & 3.5 x 12 mm Promus DES Mid CX         DR [place]ique   HERNIA REPAIR  1963   JOINT REPLACEMENT  left hip-2007, left knee-2000, right knee-1997   Left Hip, Bilateral Knee replacements   KNEE SURGERY  1997/2000   LEFT HEART CATHETERIZATION WITH CORONARY ANGIOGRAM N/A 04/25/2013   Procedure: LEFT HEART CATHETERIZATION WITH CORONARY ANGIOGRAM;  Surgeon: Sanda Klein, MD;  Location: [facility] CATH [facility];  Service: Cardiovascular;  Laterality: N/A;   PERCUTANEOUS CORONARY STENT INTERVENTION (PCI-S) N/A 10/11/2013   Procedure: PERCUTANEOUS CORONARY STENT INTERVENTION (PCI-S);  Surgeon: Peter M Martinique, MD;  Location: [facility] CATH LAB;  Service: Cardiovascular;  Laterality:  N/A;   REPLACEMENT TOTAL HIP W/  RESURFACING IMPLANTS  2007,2008   Right Foot Surgery  2004   x 2   Salivary Gland Stone Extraction     SPINAL FUSION  2006    Current Medications: Outpatient Medications Prior to Visit  Medication Sig Dispense Refill   amLODipine (NORVASC) 5 MG tablet Take 1 tablet (5 mg total) by mouth daily. 90 tablet 3   aspirin 81 MG tablet Take 1 tablet (81 mg total) by mouth daily. 90 tablet 3   atorvastatin (LIPITOR) 40 MG tablet Take 1 tablet (40 mg total) by mouth daily. 30 tablet 11   Calcium Carbonate-Vitamin D  (CALCIUM 600+D) 600-400 MG-UNIT per tablet Take 1 tablet by mouth daily.     Coenzyme Q10 (CO Q 10 PO) Take 300 mg by mouth daily.     furosemide (LASIX) 40 MG tablet Take 1 tablet (40 mg total) by mouth daily. 90 tablet 3   JANUVIA 50 MG tablet Take 1 tablet by mouth daily.     meloxicam (MOBIC) 7.5 MG tablet Take 7.5 mg by mouth daily.     metFORMIN (GLUCOPHAGE) 1000 MG tablet Take 1 tablet (1,000 mg total) by mouth 2 (two) times daily with a meal. Hold for 2 days, restart on 10/14/2013 60 tablet 0   metoprolol succinate (TOPROL-XL) 50 MG 24 hr tablet Take 50 mg by mouth daily. Take with or immediately following a meal.     mirtazapine (REMERON) 15 MG tablet Take 15 mg by mouth at bedtime.     Multiple Vitamin (MULTIVITAMIN) tablet Take 1 tablet by mouth daily.     Polyethyl Glycol-Propyl Glycol (SYSTANE OP) Place 1 drop into both eyes daily as needed (dry eyes).     potassium chloride SA (K-DUR,KLOR-CON) 20 MEQ tablet Take 1 tablet (20 mEq total) by mouth 2 (two) times daily. 180 tablet 3   QUEtiapine (SEROQUEL) 200 MG tablet Take 200 mg by mouth at bedtime.     valsartan (DIOVAN) 320 MG tablet Take 1 tablet (320 mg total) by mouth daily. 90 tablet 3   No facility-administered medications prior to visit.     Allergies:   Lisinopril, Pneumococcal vaccines, Codeine, Morphine and related, and Ultram [tramadol hcl]   Social History   Socioeconomic History   Marital status: Widowed    Spouse name: Not on file   Number of children: Not on file   Years of education: Not on file   Highest education level: Not on file  Occupational History   Not on file  Tobacco Use   Smoking status: Never Smoker   Smokeless tobacco: Never Used  Substance and Sexual Activity   Alcohol use: No   Drug use: No   Sexual activity: Never  Other Topics Concern   Not on file  Social History Narrative   Not on file   Social Determinants of Health   Financial Resource Strain:     Difficulty of Paying Living Expenses:   Food Insecurity:    Worried About [place] in the Last Year:    Arboriculturist in the Last Year:   Transportation Needs:    Film/video editor (Medical):    Lack of Transportation (Non-Medical):   Physical Activity:    Days of Exercise per Week:    Minutes of Exercise per Session:   Stress:    Feeling of Stress :   Social Connections:    Frequency of Communication with Friends and Family:  Frequency of Social Gatherings with Friends and Family:    Attends Religious Services:    Active Member of Clubs or Organizations:    Attends Archivist Meetings:    Marital Status:      Family History:  The patient's family history includes Cancer in her brother and mother; Heart attack in her brother and father; Stroke in her father.   ROS:   Please see the history of present illness.    All other systems are reviewed and are negative.   PHYSICAL EXAM:   VS:  BP 140/90    Pulse 80    Temp (!) 97.2 F (36.2 C)    Ht 5\' 4"  (1.626 m)    Wt 150 lb 3.2 oz (68.1 kg)    LMP 05/10/1998    SpO2 97%    BMI 25.78 kg/m     General: Alert, oriented x3, no distress, appears well Head: no evidence of trauma, PERRL, EOMI, no exophtalmos or lid lag, no myxedema, no xanthelasma; normal ears, nose and oropharynx Neck: normal jugular venous pulsations and no hepatojugular reflux; brisk carotid pulses without delay and no carotid bruits Chest: clear to auscultation, no signs of consolidation by percussion or palpation, normal fremitus, symmetrical and full respiratory excursions Cardiovascular: normal position and quality of the apical impulse, regular rhythm, normal first and second heart sounds, no murmurs, rubs or gallops Abdomen: no tenderness or distention, no masses by palpation, no abnormal pulsatility or arterial bruits, normal bowel sounds, no hepatosplenomegaly Extremities: no clubbing, cyanosis or edema; 2+ radial, ulnar  and brachial pulses bilaterally; 2+ right femoral, posterior tibial and dorsalis pedis pulses; 2+ left femoral, posterior tibial and dorsalis pedis pulses; no subclavian or femoral bruits Neurological: grossly nonfocal Psych: Normal mood and affect   Wt Readings from Last 3 Encounters:  07/17/19 150 lb 3.2 oz (68.1 kg)  06/07/17 143 lb 6.4 oz (65 kg)  04/30/16 165 lb 6.4 oz (75 kg)      Studies/Labs Reviewed:   EKG:  EKG is ordered today.  Normal sinus rhythm, normal tracing.  Recent Labs: 06/12/2019 Hemoglobin 12.9, creatinine 1.4 (GFR 37), potassium 4.6, normal liver function tests, Hemoglobin A1c 8.8%, Total cholesterol 137, triglycerides 116, HDL 48, LDL 68   ASSESSMENT:    1. Chronic combined systolic and diastolic congestive heart failure ([place])   2. Coronary artery disease involving native coronary artery of native heart without angina pectoris   3. Thoracic aortic aneurysm without rupture ([place])   4. Essential hypertension   5. Pure hypercholesterolemia   6. Type 2 diabetes mellitus with other circulatory complication, without long-term current use of insulin (HCC)      PLAN:  In order of problems listed above:  1. CHF: Clinically euvolemic, NYHA functional class I, on a low-dose of loop diuretic.  LVEF was 45-50% but has not been reevaluated since 2015.. 2. CAD: Asymptomatic for 6 years following her revascularization procedure.  She never had angina pectoris but presented with shortness of breath symptoms of LAD and left circumflex coronary stenosis.   3. TAAA: Asymptomatic.  Was stable between 2018 in 2019.  Time to reevaluate with CT angiography.  Note increasing creatinine on labs in May.  We will recheck this before the CT angiogram.  Hold metformin, diuretic and ARB before the CT angiogram. 4. HTN: Adequate control.  Ideally in the 120s/70s.  Needs to be treated with a beta-blocker and angiotensin blocker preferentially, due to the presence of the  aneurysm. 5. HLP:  All lipid parameters in desirable range. 6. DM: Diabetes control has deteriorated.  Working with PCP on this.    Medication Adjustments/Labs and Tests Ordered: Current medicines are reviewed at length with the patient today.  Concerns regarding medicines are outlined above.  Medication changes, Labs and Tests ordered today are listed in the Patient Instructions below. Patient Instructions  Medication Instructions:  NO CHANGE *If you need a refill on your cardiac medications before your next appointment, please call your pharmacy*   Lab Work: Your physician recommends that you return for lab work [place] If you have labs (blood work) drawn today and your tests are completely normal, you will receive your results only by:  [place] (if you have MyChart) OR  A paper copy in the mail If you have any lab test that is abnormal or we need to change your treatment, we will call you to review the results.   Testing/Procedures: CTA OF THE CHEST W/WO TO FOLLOW UP THORACIC ANEURYSM AT [address] IMAGING   Follow-Up: At [facility], you and your health needs are our priority.  As part of our continuing mission to provide you with exceptional heart care, we have created designated Provider Care Teams.  These Care Teams include your primary Cardiologist (physician) and Advanced Practice Providers (APPs -  Physician Assistants and Nurse Practitioners) who all work together to provide you with the care you need, when you need it.  We recommend signing up for the patient portal called "MyChart".  Sign up information is provided on this After Visit Summary.  MyChart is used to connect with patients for Virtual Visits (Telemedicine).  Patients are able to view lab/test results, encounter notes, upcoming appointments, etc.  Non-urgent messages can be sent to your provider as well.   To learn more about what you can do with MyChart, go to  [email or web address].    Your next appointment:   12 month(s)  The format for your next appointment:   Either In Person or Virtual  Provider:   You may see Sanda Klein MD or one of the following Advanced Practice Providers on your designated Care Team:    Almyra Deforest, PA-C  Fabian Sharp, PA-C or   Roby Lofts, PA-C        Signed, Sanda Klein, MD  07/22/2019 1:17 PM    [address], [place], [place]  [address] Phone: [phone]; Fax: [phone]

## 2019-07-17 NOTE — Telephone Encounter (Signed)
New message   Patient states that her son will be coming to the office visit with her she has difficulty walking. Please advise.

## 2019-07-17 NOTE — Patient Instructions (Signed)
Medication Instructions:  NO CHANGE *If you need a refill on your cardiac medications before your next appointment, please call your pharmacy*   Lab Work: Your physician recommends that you return for lab work WHEN CONVENIENT If you have labs (blood work) drawn today and your tests are completely normal, you will receive your results only by: [place] MyChart Message (if you have MyChart) OR . A paper copy in the mail If you have any lab test that is abnormal or we need to change your treatment, we will call you to review the results.   Testing/Procedures: CTA OF THE CHEST W/WO TO FOLLOW UP THORACIC ANEURYSM AT [address]=[facility]   Follow-Up: At [facility], you and your health needs are our priority.  As part of our continuing mission to provide you with exceptional heart care, we have created designated Provider Care Teams.  These Care Teams include your primary Cardiologist (physician) and Advanced Practice Providers (APPs -  Physician Assistants and Nurse Practitioners) who all work together to provide you with the care you need, when you need it.  We recommend signing up for the patient portal called "MyChart".  Sign up information is provided on this After Visit Summary.  MyChart is used to connect with patients for Virtual Visits (Telemedicine).  Patients are able to view lab/test results, encounter notes, upcoming appointments, etc.  Non-urgent messages can be sent to your provider as well.   To learn more about what you can do with MyChart, go to [email or web address].    Your next appointment:   12 month(s)  The format for your next appointment:   Either In Person or Virtual  Provider:   You may see Sanda Klein MD or one of the following Advanced Practice Providers on your designated Care Team:    Almyra Deforest, PA-C  Fabian Sharp, PA-C or   Roby Lofts, [place]

## 2019-07-17 NOTE — Telephone Encounter (Signed)
Pt calling and asking if her son may attend her appt after her EKG is done to help with decision making on having her Right hip replaced. Notified that this would be fine and this message would be sent to the nurse working with Dr.Croitoru today to make them aware. Pt very grateful for the call and had no other questions at this time.

## 2019-07-22 ENCOUNTER — Encounter: Payer: Self-pay | Admitting: Cardiovascular Disease

## 2019-07-25 ENCOUNTER — Telehealth: Payer: Self-pay | Admitting: *Deleted

## 2019-07-25 NOTE — Telephone Encounter (Signed)
The patient has been made aware and verbalized her understanding.   She will come early next week for the labs.

## 2019-07-25 NOTE — Telephone Encounter (Signed)
-----   Message from Sanda Klein, MD sent at 07/22/2019  1:14 PM EDT ----- She was to be scheduled for CT angiogram for thoracic aortic aneurysm. Can you please make sure that she does not take Metformin, valsartan or furosemide on the day of the test (restart Metformin 72 hours after the procedure, the others can be restarted in 24 hours).

## 2019-07-30 LAB — BASIC METABOLIC PANEL
BUN/Creatinine Ratio: 26 (ref 12–28)
BUN: 34 mg/dL — ABNORMAL HIGH (ref 8–27)
CO2: 22 mmol/L (ref 20–29)
Calcium: 10.2 mg/dL (ref 8.7–10.3)
Chloride: 101 mmol/L (ref 96–106)
Creatinine, Ser: 1.33 mg/dL — ABNORMAL HIGH (ref 0.57–1.00)
GFR calc Af Amer: 41 mL/min/{1.73_m2} — ABNORMAL LOW (ref >59)
GFR calc non Af Amer: 35 mL/min/{1.73_m2} — ABNORMAL LOW (ref >59)
Glucose: 228 mg/dL — ABNORMAL HIGH (ref 65–99)
Potassium: 4.7 mmol/L (ref 3.5–5.2)
Sodium: 141 mmol/L (ref 134–144)

## 2019-08-02 ENCOUNTER — Ambulatory Visit
Admission: RE | Admit: 2019-08-02 | Discharge: 2019-08-02 | Disposition: A | Discharge status: Home / Self Care | Payer: Medicare Other | Source: Ambulatory Visit | Attending: Cardiovascular Disease | Admitting: Cardiovascular Disease

## 2019-08-02 ENCOUNTER — Other Ambulatory Visit: Payer: Self-pay

## 2019-08-02 DIAGNOSIS — I712 Thoracic aortic aneurysm, without rupture, unspecified: Secondary | ICD-10-CM

## 2019-08-02 MED ORDER — IOPAMIDOL (ISOVUE-370) INJECTION 76%
75.0000 mL | Freq: Once | INTRAVENOUS | Status: AC | PRN
  Administered 2019-08-02: 75 mL via INTRAVENOUS

## 2019-08-06 ENCOUNTER — Other Ambulatory Visit: Payer: Self-pay | Admitting: *Deleted

## 2019-08-06 DIAGNOSIS — I712 Thoracic aortic aneurysm, without rupture, unspecified: Secondary | ICD-10-CM

## 2019-11-07 DIAGNOSIS — Z86018 Personal history of other benign neoplasm: Secondary | ICD-10-CM | POA: Diagnosis not present

## 2019-11-07 DIAGNOSIS — L570 Actinic keratosis: Secondary | ICD-10-CM | POA: Diagnosis not present

## 2019-11-07 DIAGNOSIS — L578 Other skin changes due to chronic exposure to nonionizing radiation: Secondary | ICD-10-CM | POA: Diagnosis not present

## 2019-11-07 DIAGNOSIS — L821 Other seborrheic keratosis: Secondary | ICD-10-CM | POA: Diagnosis not present

## 2019-11-07 DIAGNOSIS — D2230 Melanocytic nevi of unspecified part of face: Secondary | ICD-10-CM | POA: Diagnosis not present

## 2019-11-07 DIAGNOSIS — L720 Epidermal cyst: Secondary | ICD-10-CM | POA: Diagnosis not present

## 2019-11-07 DIAGNOSIS — D225 Melanocytic nevi of trunk: Secondary | ICD-10-CM | POA: Diagnosis not present

## 2019-11-08 DIAGNOSIS — Z23 Encounter for immunization: Secondary | ICD-10-CM | POA: Diagnosis not present

## 2019-12-13 DIAGNOSIS — E1122 Type 2 diabetes mellitus with diabetic chronic kidney disease: Secondary | ICD-10-CM | POA: Diagnosis not present

## 2019-12-13 DIAGNOSIS — E559 Vitamin D deficiency, unspecified: Secondary | ICD-10-CM | POA: Diagnosis not present

## 2019-12-13 DIAGNOSIS — E1159 Type 2 diabetes mellitus with other circulatory complications: Secondary | ICD-10-CM | POA: Diagnosis not present

## 2019-12-20 DIAGNOSIS — M1990 Unspecified osteoarthritis, unspecified site: Secondary | ICD-10-CM | POA: Diagnosis not present

## 2019-12-20 DIAGNOSIS — I714 Abdominal aortic aneurysm, without rupture: Secondary | ICD-10-CM | POA: Diagnosis not present

## 2019-12-20 DIAGNOSIS — E7800 Pure hypercholesterolemia, unspecified: Secondary | ICD-10-CM | POA: Diagnosis not present

## 2019-12-20 DIAGNOSIS — N183 Chronic kidney disease, stage 3 unspecified: Secondary | ICD-10-CM | POA: Diagnosis not present

## 2019-12-20 DIAGNOSIS — E559 Vitamin D deficiency, unspecified: Secondary | ICD-10-CM | POA: Diagnosis not present

## 2019-12-20 DIAGNOSIS — I5022 Chronic systolic (congestive) heart failure: Secondary | ICD-10-CM | POA: Diagnosis not present

## 2019-12-20 DIAGNOSIS — Z0000 Encounter for general adult medical examination without abnormal findings: Secondary | ICD-10-CM | POA: Diagnosis not present

## 2019-12-20 DIAGNOSIS — I2510 Atherosclerotic heart disease of native coronary artery without angina pectoris: Secondary | ICD-10-CM | POA: Diagnosis not present

## 2019-12-20 DIAGNOSIS — E1159 Type 2 diabetes mellitus with other circulatory complications: Secondary | ICD-10-CM | POA: Diagnosis not present

## 2019-12-20 DIAGNOSIS — E1122 Type 2 diabetes mellitus with diabetic chronic kidney disease: Secondary | ICD-10-CM | POA: Diagnosis not present

## 2020-02-29 DIAGNOSIS — M1611 Unilateral primary osteoarthritis, right hip: Secondary | ICD-10-CM | POA: Diagnosis not present

## 2020-03-20 ENCOUNTER — Other Ambulatory Visit: Payer: Self-pay

## 2020-03-20 ENCOUNTER — Ambulatory Visit (INDEPENDENT_AMBULATORY_CARE_PROVIDER_SITE_OTHER): Payer: Medicare Other | Admitting: Medical

## 2020-03-20 VITALS — BP 160/86 | HR 83 | Ht 64.0 in | Wt 145.0 lb

## 2020-03-20 DIAGNOSIS — R011 Cardiac murmur, unspecified: Secondary | ICD-10-CM

## 2020-03-20 DIAGNOSIS — E785 Hyperlipidemia, unspecified: Secondary | ICD-10-CM | POA: Diagnosis not present

## 2020-03-20 DIAGNOSIS — I2510 Atherosclerotic heart disease of native coronary artery without angina pectoris: Secondary | ICD-10-CM

## 2020-03-20 DIAGNOSIS — I10 Essential (primary) hypertension: Secondary | ICD-10-CM

## 2020-03-20 DIAGNOSIS — I712 Thoracic aortic aneurysm, without rupture, unspecified: Secondary | ICD-10-CM

## 2020-03-20 DIAGNOSIS — E1159 Type 2 diabetes mellitus with other circulatory complications: Secondary | ICD-10-CM | POA: Diagnosis not present

## 2020-03-20 DIAGNOSIS — Z01818 Encounter for other preprocedural examination: Secondary | ICD-10-CM

## 2020-03-20 DIAGNOSIS — I5042 Chronic combined systolic (congestive) and diastolic (congestive) heart failure: Secondary | ICD-10-CM

## 2020-03-20 MED ORDER — CARVEDILOL 6.25 MG PO TABS: 6.2500 mg | ORAL_TABLET | Freq: Two times a day (BID) | ORAL | 3 refills | Status: DC

## 2020-03-20 NOTE — Progress Notes (Addendum)
Cardiology Office Note   Date:  03/23/2020   ID:  Karen [name], DOB [birth year], MRN [id number]  PCP:  [name], DO  Cardiologist:  Sanda Klein, MD EP: None  Chief Complaint  Patient presents with  . Pre-op Exam      History of Present Illness: Karen [name] is a 87 y.o. female with a PMH of CAD, chronic combined CHF, HTN, HLD, DM type 2, ascending aortic aneurysm, who presents for preop assessment.   She was last evaluated by cardiology at an outpatient visit with Dr. Sallyanne Kuster 07/2019, at which time she had complaints of right hip/groin pain, though no significant cardiac complaints. She had a CTA chest 07/2019 to monitor her aortic aneurysms which showed a slightly enlarged distal descending aorta (42mm) and stable ascending aortic aneurysm of 8mm. She was recommended for annual follow-up. Her last echocardiogram was in 2015 with EF 45-50% with apical hypokinesis, moderate basal septal hypertrophy, moderately dilated LV, mild AI, mild MR, and moderate LAE. She had a LHC in 04/2013 with 30-40% dLM stenosis, 40% pLAD stenosis, 90% m-dLAD stenosis, 70-90% m-dLCx stenosis, 40-50% mRcA stenosis, and 20-80% PDA stenosis with EF 50%. She subsequently underwent PCI/DES to LAD and LCx 10/2013  She presents today for preop assessment. She has been struggling with right hip pain for quite some time. It is now limiting her activity quite a bit. She ambulates with a walker and continues to be fairly active within her home, though is unable to walk any significant distances. She can do her ADLs, do her laundry, light cooking/cleaning, and walk in her home without complaints of chest pain or SOB. She has no complaints of DOE, LE edema, orthopnea, PND, dizziness, lightheadedness, or syncope. BP is elevated today which she attributes to being in pain. She does not monitor her blood pressure regularly at home and I recommended she obtain a BP cuff from the pharmacy for home monitoring. She  remains quite sharp and has several hobbies to occupy her time - sewing, cooking, and even writing a cookbook.     Past Medical History:  Diagnosis Date  . Arthritis   . Constipation   . Coronary artery disease 04/2013   Three-vessel disease, initial medical therapy then stenting of the LAD and CFX 10/2013  . Diabetes mellitus    TYPE 2  . Fibromyalgia   . H/O: gout   . Hx: UTI (urinary tract infection)   . Hypertension     Past Surgical History:  Procedure Laterality Date  . APPENDECTOMY  1951  . [place]  . CARDIAC CATHETERIZATION  04/2013   Left main 30-40%, prox LAD 40%, mid/distal LAD 90%, CFX 70%, OM 3 90%, RCA 40-50%, PDA 20-80%, EF 50%   . CHOLECYSTECTOMY  1951  . Closed reduction of Left Total Hip  2008   x 3  . CORONARY STENT PLACEMENT  10/11/2013   2.25 x 20 mm Promus DES to mid/distal LAD & 3.5 x 12 mm Promus DES Mid CX         DR Martinique  . [place]  . JOINT REPLACEMENT  left hip-2007, left knee-2000, right knee-1997   Left Hip, Bilateral Knee replacements  . KNEE SURGERY  1997/2000  . LEFT HEART CATHETERIZATION WITH CORONARY ANGIOGRAM N/A 04/25/2013   Procedure: LEFT HEART CATHETERIZATION WITH CORONARY ANGIOGRAM;  Surgeon: Sanda Klein, MD;  Location: [facility] CATH [facility];  Service: Cardiovascular;  Laterality: N/A;  . PERCUTANEOUS CORONARY STENT INTERVENTION (  PCI-S) N/A 10/11/2013   Procedure: PERCUTANEOUS CORONARY STENT INTERVENTION (PCI-S);  Surgeon: Peter M Martinique, MD;  Location: [facility] CATH LAB;  Service: Cardiovascular;  Laterality: N/A;  . REPLACEMENT TOTAL HIP W/  RESURFACING IMPLANTS  7829,[id number]  . Right Foot Surgery  2004   x 2  . Salivary Gland Stone Extraction    . SPINAL FUSION  2006     Current Outpatient Medications  Medication Sig Dispense Refill  . amLODipine (NORVASC) 5 MG tablet Take 1 tablet (5 mg total) by mouth daily. 90 tablet 3  . aspirin 81 MG tablet Take 1 tablet (81 mg total) by mouth daily. 90 tablet 3  . Calcium  Carbonate-Vitamin D 600-400 MG-UNIT tablet Take 1 tablet by mouth daily.    . carvedilol (COREG) 6.25 MG tablet Take 1 tablet (6.25 mg total) by mouth 2 (two) times daily. 180 tablet 3  . Coenzyme Q10 (CO Q 10 PO) Take 300 mg by mouth daily.    . furosemide (LASIX) 40 MG tablet Take 1 tablet (40 mg total) by mouth daily. 90 tablet 3  . glipiZIDE (GLUCOTROL XL) 2.5 MG 24 hr tablet <span ID="[id number]" style="Bold">glipiZIDE 5 mg oral tablet, extended release</span><br/>Start Date: 12/21/19<br/>Status: Ordered    . JANUVIA 50 MG tablet Take 1 tablet by mouth daily.    . meloxicam (MOBIC) 7.5 MG tablet Take 7.5 mg by mouth daily.    . metFORMIN (GLUCOPHAGE) 1000 MG tablet Take 1 tablet (1,000 mg total) by mouth 2 (two) times daily with a meal. Hold for 2 days, restart on 10/14/2013 60 tablet 0  . mirtazapine (REMERON) 15 MG tablet Take 15 mg by mouth at bedtime.    . Multiple Vitamin (MULTIVITAMIN) tablet Take 1 tablet by mouth daily.    Vladimir Faster Glycol-Propyl Glycol (SYSTANE OP) Place 1 drop into both eyes daily as needed (dry eyes).    . potassium chloride SA (K-DUR,KLOR-CON) 20 MEQ tablet Take 1 tablet (20 mEq total) by mouth 2 (two) times daily. 180 tablet 3  . QUEtiapine (SEROQUEL) 200 MG tablet Take 200 mg by mouth at bedtime.    . valsartan (DIOVAN) 320 MG tablet Take 1 tablet (320 mg total) by mouth daily. 90 tablet 3  . atorvastatin (LIPITOR) 40 MG tablet Take 1 tablet (40 mg total) by mouth daily. 30 tablet 11   No current facility-administered medications for this visit.    Allergies:   Lisinopril, Pneumococcal vaccines, Codeine, Morphine and related, and Ultram [tramadol hcl]    Social History:  The patient  reports that she has never smoked. She has never used smokeless tobacco. She reports that she does not drink alcohol and does not use drugs.   Family History:  The patient's family history includes Cancer in her brother and mother; Heart attack in her brother and  father; Stroke in her father.    ROS:  Please see the history of present illness.   Otherwise, review of systems are positive for none.   All other systems are reviewed and negative.    PHYSICAL EXAM: VS:  BP (!) 160/86 (BP Location: Right Arm, Patient Position: Sitting)   Pulse 83   Ht 5\' 4"  (1.626 m)   Wt 145 lb (65.8 kg)   LMP 05/10/1998   BMI 24.89 kg/m  , BMI Body mass index is 24.89 kg/m. GEN: Well nourished, well developed, looking sharp in her red blazer today. HEENT: sclera anicteric Neck: no JVD, carotid bruits, or masses Cardiac: RRR; +murmur most notable at  RUSB, no rubs or gallops, no edema  Respiratory:  clear to auscultation bilaterally, normal work of breathing GI: soft, nontender, nondistended, + BS MS: no deformity or atrophy Skin: warm and dry, no rash Neuro:  Strength and sensation are intact Psych: euthymic mood, full affect   EKG:  EKG is ordered today. The ekg ordered today demonstrates sinus rhythm, rate 83 bpm, isolated TWI in aVL, no STE/D, no significant change from previous.    Recent Labs: 07/30/2019: BUN 34; Creatinine, Ser 1.33; Potassium 4.7; Sodium 141    Lipid Panel No results found for: CHOL, TRIG, HDL, CHOLHDL, VLDL, LDLCALC, LDLDIRECT    Wt Readings from Last 3 Encounters:  03/20/20 145 lb (65.8 kg)  07/17/19 150 lb 3.2 oz (68.1 kg)  06/07/17 143 lb 6.4 oz (65 kg)      Other studies Reviewed: Additional studies/ records that were reviewed today include:   Echocardiogram 2015: - Left ventricle: Septal and apical hypokinesis Moderate  basal septal hypertrophy with sigmoid septum The cavity  size was moderately dilated. Systolic function was mildly  reduced. The estimated ejection fraction was in the range  of 45% to 50%.  - Aortic valve: Mild regurgitation.  - Mitral valve: Calcified annulus. Mild regurgitation.  - Left atrium: The atrium was moderately dilated.  - Atrial septum: No defect or patent foramen ovale was   identified.      ASSESSMENT AND PLAN:  1. Preop assessment: patient is anticipating a hip surgery 05/14/2020 with Dr. Wynelle Link. He hip pain has limited her activity significantly over the past several months/year. She is able to do some activities without anginal complaints, though cannot quite complete 4 METs. She had no significant cardiac complaints today. She does have a murmur on exam with prior history of mild MR/mild AI.  - Will plan to update an echocardiogram prior to surgery  - Do not feel strongly about an ischemic evaluation at this time. Will route to Dr. Sallyanne Kuster for input, though if echo is overall unchanged, do not feel further cardiac work-up will be necessary.  - Will finalize recommendations pending above.  2. CAD s/p PCI/DES to LAD and LCx in 2015: no anginal complaints to suggest significant progression of CAD.  - Continue aspirin and statin - Continue BBlocker  3. Chronic combined CHF: mildly reduced EF of 45-50% in 2015. Likely this was ICM. She underwent subsequent stenting though has not had re-evaluation of LV function since that time. She does not describe any volume overload complaints and appears euvolemic on exam - Will update an echo as above for the evaluation of a cardiac murmur on exam in anticipation of surgery - Continue carvedilol and valsartan - Continue lasix  4. Murmur: noted on exam today to have a murmur along right upper sternal border. Her last echo in 2015 noted mild MR and mild AI.  - Will update an echocardiogram in the next 2-3 weeks   5. HTN: BP up today, though she reports being in a significant amount of pain. She is hesitant to adjust medications today despite encouragement for better BP control in the setting of her aortic aneurysms. She does not monitor BP at home - Encouraged patient to pick up a BP cuff on her way home and monitor BP over the next couple weeks. Instructed to notify the office if BP persistently >130/80.  - Consider  increase in carvedilol if BP remains elevated - Continue valsartan and lasix  6. HLD: no recent lipids - Continue atorvastatin 40mg   daily  7. Aortic aneurysm: stable on last CTA 07/2019.  - Plan for repeat CTA 07/2020  - Continue BP control as above - Continue statin  8. DM type 2: managed by PCP; goal A1C <7 - Continue metformin, glipizide, and januvia per PCP   Current medicines are reviewed at length with the patient today.  The patient does not have concerns regarding medicines.  The following changes have been made:  As above  Labs/ tests ordered today include:   Orders Placed This Encounter  Procedures  . EKG 12-Lead  . ECHOCARDIOGRAM COMPLETE     Disposition:   FU with Dr. Sallyanne Kuster in 4 months  Signed, Abigail Butts, PA-C  03/23/2020 12:00 PM     Echo results as follows. 1. Left ventricular ejection fraction, by estimation, is 60 to 65%. The  left ventricle has normal function. The left ventricle has no regional  wall motion abnormalities. There is severe asymmetric left ventricular  hypertrophy of the basal-septal  segment. Left ventricular diastolic parameters are indeterminate.  2. Right ventricular systolic function is normal. The right ventricular  size is normal. There is normal pulmonary artery systolic pressure. The  estimated right ventricular systolic pressure is 39.0 mmHg.  3. The mitral valve is abnormal. Trivial mitral valve regurgitation.  Moderate mitral stenosis. Severe mitral annular calcification. MG 14mmHg at  HR 82 bpm, MVA 1.1 cm^2 by continuity equation  4. The aortic valve is tricuspid. Aortic valve regurgitation is mild.  Mild to moderate aortic valve sclerosis/calcification is present, without  any evidence of aortic stenosis.  5. Aortic dilatation noted. Aneurysm of the ascending aorta, measuring 45  mm.  6. The inferior vena cava is normal in size with greater than 50%  respiratory variability, suggesting right atrial pressure  of 3 mmHg.  Based on ACC/AHA guidelines, Karen [name] would be at acceptable risk for the planned procedure without further cardiovascular testing.   I will route this recommendation to the requesting party via Epic fax function.  Please call with questions.  Abigail Butts, PA-C 04/12/2020, 8:12 AM

## 2020-03-20 NOTE — Patient Instructions (Addendum)
Medication Instructions:   STOP Metoprolol Succinate (Toprol)   START Carvedilol (Coreg) 6.25 mg 2 times a day  *If you need a refill on your cardiac medications before your next appointment, please call your pharmacy*  Lab Work: NONE ordered at this time of appointment   If you have labs (blood work) drawn today and your tests are completely normal, you will receive your results only by: Marland Kitchen MyChart Message (if you have MyChart) OR . A paper copy in the mail If you have any lab test that is abnormal or we need to change your treatment, we will call you to review the results.  Testing/Procedures: Your physician has requested that you have an echocardiogram. Echocardiography is a painless test that uses sound waves to create images of your heart. It provides your doctor with information about the size and shape of your heart and how well your heart's chambers and valves are working. This procedure takes approximately one hour. There are no restrictions for this procedure.   Please schedule for 1-2 weeks   Follow-Up: At [facility], you and your health needs are our priority.  As part of our continuing mission to provide you with exceptional heart care, we have created designated Provider Care Teams.  These Care Teams include your primary Cardiologist (physician) and Advanced Practice Providers (APPs -  Physician Assistants and Nurse Practitioners) who all work together to provide you with the care you need, when you need it.   Your next appointment:   4 month(s)  The format for your next appointment:   In Person  Provider:   Sanda Klein, MD  Other Instructions  Continue to monitor blood pressure (BP) at home. If your BP is consistently greater than 130/80 please give our office a call

## 2020-03-23 ENCOUNTER — Encounter: Payer: Self-pay | Admitting: Medical

## 2020-03-23 NOTE — Progress Notes (Signed)
I agree with ordering the echocardiogram, it is possible that her aortic insufficiency has progressed since she did have a dilated aorta.  On the other hand, I doubt that we will ever recommend surgery on her aorta.  I do not think she needs to delay her orthopedic procedure for the echo. But I would make it clear in the note that her beta-blocker (carvedilol) should not be interrupted in the perioperative period.

## 2020-03-24 ENCOUNTER — Telehealth: Payer: Self-pay | Admitting: Cardiovascular Disease

## 2020-03-24 NOTE — Telephone Encounter (Signed)
Advised patient, verbalized understanding  

## 2020-03-24 NOTE — Telephone Encounter (Signed)
Pt c/o medication issue:  1. Name of Medication: carvedilol (COREG) 6.25 MG tablet  2. How are you currently taking this medication (dosage and times per day)?has not taken since Sunday morning. Patient started this medication Thursday night  3. Are you having a reaction (difficulty breathing--STAT)?   4. What is your medication issue? Patient said she felt very weak, dizzy, flushed and had an irregular heartbeat.   Patient said Roby Lofts changed this medication from Metoprolol to Carvediolol because her BP was high when she came into the office. The patient said she wants to go back on her other medication

## 2020-03-24 NOTE — Telephone Encounter (Signed)
Please restart metoprolol succinate 50 mg daily and DC carvedilol.  Please send me a once daily BP and HR log after one week of recording.  Please make sure to take the metoprolol on the day of her orthopedic surgery and all postop days.

## 2020-03-24 NOTE — Telephone Encounter (Signed)
Spoke with patient and she saw Teodoro Kil PA last week  Her Metoprolol was d/c and Carvedilol 6.25 mg BID was started  She took about 6 pills with last one being yesterday morning She was feeling dizzy and weak which increased the more she took Yesterday morning after taking she felt weak, dizzy, trembling, and could feel her heart pounding.  Blood pressure has actually gone up since starting, 189/92, 166/82, and lowest being 157/80's. Heart rate running in 80's She would like to go back to Metoprolol Will forward to Dr Sallyanne Kuster for review

## 2020-04-02 ENCOUNTER — Telehealth: Payer: Self-pay | Admitting: Cardiovascular Disease

## 2020-04-02 NOTE — Telephone Encounter (Signed)
Patient calling with BP readings:  Feb 16th at 11:19 am 162/98 HR 82 Feb 17th at 11:20 am 148/97 HR 76 Feb 18th at 11:09 am 168/103 HR 84 Feb 19th at 11:15 am 181/104 HR 80 Feb 20th at 11:00 am 162/94 HR 85 Feb 21st at 11:00 am 161/95 HR 81 Feb 22nd at 11:10 am 167/102 HR 82   Pt c/o Shortness Of Breath: STAT if SOB developed within the last 24 hours or pt is noticeably SOB on the phone  1. Are you currently SOB (can you hear that pt is SOB on the phone)? no  2. How long have you been experiencing SOB? Past two nights  3. Are you SOB when sitting or when up moving around? When she gets up in the middle of the night  4. Are you currently experiencing any other symptoms? No   Patient states she has also been having SOB. She states she has gotten it the past two night around midnight. She states she has never gotten SOB in the middle of the night before. She states each time it lasted around 2 minutes. She is not having any SOB now.

## 2020-04-02 NOTE — Telephone Encounter (Signed)
Spoke with patient regarding blood pressure and shortness of breath She had last 2 nights shortness of breath when she got up to go to restroom, difficult to get to restroom. Subsided within couple of minutes. Only weighs once a week , denies any swelling  Her blood pressure readings as Dr Levert Feinstein requested  Feb 16th at 11:19 am 162/98 HR 82 Feb 17th at 11:20 am 148/97 HR 76 Feb 18th at 11:09 am 168/103 HR 84 Feb 19th at 11:15 am 181/104 HR 80 Feb 20th at 11:00 am 162/94 HR 85 Feb 21st at 11:00 am 161/95 HR 81 Feb 22nd at 11:10 am 167/102 HR 82  Started Metoprolol back on 2/15 secondary to reaction to Carvedilol (diarrhea, shaking, increased urinating, weakness, and dizziness)  Will forward to Dr Sallyanne Kuster for review. Patient aware he is not back in office until next week but only wants him to make any changes

## 2020-04-03 DIAGNOSIS — I10 Essential (primary) hypertension: Secondary | ICD-10-CM | POA: Diagnosis not present

## 2020-04-03 DIAGNOSIS — T887XXA Unspecified adverse effect of drug or medicament, initial encounter: Secondary | ICD-10-CM | POA: Diagnosis not present

## 2020-04-03 DIAGNOSIS — R0602 Shortness of breath: Secondary | ICD-10-CM | POA: Diagnosis not present

## 2020-04-03 NOTE — Telephone Encounter (Addendum)
Returned the call to the patient. She stated that last night she woke up to a feeling of shortness of breath and a "warm feeling" She sat up in bed and the feeling got better after a few hours. She stated that this only occurs occasionally some nights and not during the day. She did state that she feels weak and tired during the day.  She denies edema, chest pain and shortness of breath (except for occasionally during the night). She wanted an appointment today with an APP but has been advised the soonest appointment is 3/1. She has been advised to call her PCP as well which she stated that she will.  She also stated that she will go to the ED since she feels poorly and wants to be assessed.

## 2020-04-03 NOTE — Telephone Encounter (Signed)
Follow Up:    Pt says she is not any better, she says she have to be seen today by somebody.   Pt c/o Shortness Of Breath: STAT if SOB developed within the last 24 hours or pt is noticeably SOB on the phone  1. Are you currently SOB (can you hear that pt is SOB on the phone)? No at this time*  2. How long have you been experiencing SOB? * last night, had to sit up in bed for an hour  3. Are you SOB when sitting or when up moving around?   4. Are you currently experiencing any other symptoms? Real shaky and real weak

## 2020-04-04 NOTE — Telephone Encounter (Signed)
Call placed to the patient to check on her. She saw her PCP who completed an EKG and increased her Amlodipine to 5 mg bid.   She will continue to check her blood pressure daily and keep a log of these. Blood pressure today was 151/92  She has a follow up with PCP on Friday the 4th and echo the same day.

## 2020-04-09 NOTE — Telephone Encounter (Signed)
OK, that's fine. [employer] BP is 130/80. As a small detail, OK to take the whole 10 mg of Amlodipine all at once, for convenience.

## 2020-04-11 ENCOUNTER — Other Ambulatory Visit: Payer: Self-pay

## 2020-04-11 ENCOUNTER — Ambulatory Visit (HOSPITAL_COMMUNITY): Payer: Medicare Other | Attending: Cardiology

## 2020-04-11 DIAGNOSIS — I10 Essential (primary) hypertension: Secondary | ICD-10-CM | POA: Diagnosis not present

## 2020-04-11 DIAGNOSIS — R0602 Shortness of breath: Secondary | ICD-10-CM | POA: Diagnosis not present

## 2020-04-11 DIAGNOSIS — R011 Cardiac murmur, unspecified: Secondary | ICD-10-CM | POA: Diagnosis not present

## 2020-04-11 LAB — ECHOCARDIOGRAM COMPLETE
Area-P 1/2: 2.91 cm2
P 1/2 time: 373 msec
S' Lateral: 2.6 cm

## 2020-04-30 NOTE — Progress Notes (Signed)
DUE TO COVID-19 ONLY ONE VISITOR IS ALLOWED TO COME WITH YOU AND STAY IN THE WAITING ROOM ONLY DURING PRE OP AND PROCEDURE DAY OF SURGERY. THE 1 VISITOR  MAY VISIT WITH YOU AFTER SURGERY IN YOUR PRIVATE ROOM DURING VISITING HOURS ONLY!  YOU NEED TO HAVE A COVID 19 TEST ON___4/05/2020 ____ @_______ , THIS TEST MUST BE DONE BEFORE SURGERY,  COVID TESTING SITE [address] [place] [address], IT IS ON THE RIGHT GOING OUT [address] APPROXIMATELY  2 MINUTES PAST [employer] ON THE RIGHT. ONCE YOUR COVID TEST IS COMPLETED,  PLEASE BEGIN THE QUARANTINE INSTRUCTIONS AS OUTLINED IN YOUR HANDOUT.                Karen [name]  04/30/2020   Your procedure is scheduled on:   05/14/2020   Report to [facility] Main  Entrance   Report to admitting at    1100 AM     Call this number if you have problems the morning of surgery [phone]    REMEMBER: NO  SOLID FOOD CANDY OR GUM AFTER MIDNIGHT. CLEAR LIQUIDS UNTIL 1030am         . NOTHING BY MOUTH EXCEPT CLEAR LIQUIDS UNTIL  1030 am  . PLEASE FINISH ENSURE DRINK PER SURGEON ORDER  WHICH NEEDS TO BE COMPLETED AT 1030am      .      CLEAR LIQUID DIET   Foods Allowed                                                                    Coffee and tea, regular and decaf                            Fruit ices (not with fruit pulp)                                      Iced Popsicles                                    Carbonated beverages, regular and diet                                    Cranberry, grape and apple juices Sports drinks like Gatorade Lightly seasoned clear broth or consume(fat free) Sugar, honey syrup ___________________________________________________________________      BRUSH YOUR TEETH MORNING OF SURGERY AND RINSE YOUR MOUTH OUT, NO CHEWING GUM CANDY OR MINTS.     Take these medicines the morning of surgery with A SIP OF WATER: amlodipine, toprol  DO NOT TAKE ANY DIABETIC MEDICATIONS DAY OF YOUR  SURGERY                               You may not have any metal on your body including hair pins and              piercings  Do not wear jewelry, make-up, lotions, powders or perfumes, deodorant             Do not wear nail polish on your fingernails.  Do not shave  48 hours prior to surgery.              Men may shave face and neck.   Do not bring valuables to the hospital. [place].  Contacts, dentures or bridgework may not be worn into surgery.  Leave suitcase in the car. After surgery it may be brought to your room.     Patients discharged the day of surgery will not be allowed to drive home. IF YOU ARE HAVING SURGERY AND GOING HOME THE SAME DAY, YOU MUST HAVE AN ADULT TO DRIVE YOU HOME AND BE WITH YOU FOR 24 HOURS. YOU MAY GO HOME BY TAXI OR UBER OR ORTHERWISE, BUT AN ADULT MUST ACCOMPANY YOU HOME AND STAY WITH YOU FOR 24 HOURS.  Name and phone number of your driver:  Special Instructions: N/A              Please read over the following fact sheets you were given: _____________________________________________________________________  [facility] - Preparing for Surgery Before surgery, you can play an important role.  Because skin is not sterile, your skin needs to be as free of germs as possible.  You can reduce the number of germs on your skin by washing with CHG (chlorahexidine gluconate) soap before surgery.  CHG is an antiseptic cleaner which kills germs and bonds with the skin to continue killing germs even after washing. Please DO NOT use if you have an allergy to CHG or antibacterial soaps.  If your skin becomes reddened/irritated stop using the CHG and inform your nurse when you arrive at Short Stay. Do not shave (including legs and underarms) for at least 48 hours prior to the first CHG shower.  You may shave your face/neck. Please follow these instructions carefully:  1.  Shower with CHG Soap the night before surgery and the   morning of Surgery.  2.  If you choose to wash your hair, wash your hair first as usual with your  normal  shampoo.  3.  After you shampoo, rinse your hair and body thoroughly to remove the  shampoo.                           4.  Use CHG as you would any other liquid soap.  You can apply chg directly  to the skin and wash                       Gently with a scrungie or clean washcloth.  5.  Apply the CHG Soap to your body ONLY FROM THE NECK DOWN.   Do not use on face/ open                           Wound or open sores. Avoid contact with eyes, ears mouth and genitals (private parts).                       Wash face,  Genitals (private parts) with your normal soap.             6.  Wash  thoroughly, paying special attention to the area where your surgery  will be performed.  7.  Thoroughly rinse your body with warm water from the neck down.  8.  DO NOT shower/wash with your normal soap after using and rinsing off  the CHG Soap.                9.  Pat yourself dry with a clean towel.            10.  Wear clean pajamas.            11.  Place clean sheets on your bed the night of your first shower and do not  sleep with pets. Day of Surgery : Do not apply any lotions/deodorants the morning of surgery.  Please wear clean clothes to the hospital/surgery center.  FAILURE TO FOLLOW THESE INSTRUCTIONS MAY RESULT IN THE CANCELLATION OF YOUR SURGERY PATIENT SIGNATURE_________________________________  NURSE SIGNATURE__________________________________  ________________________________________________________________________

## 2020-05-05 ENCOUNTER — Encounter (HOSPITAL_COMMUNITY)
Admission: RE | Admit: 2020-05-05 | Discharge: 2020-05-05 | Disposition: A | Discharge status: Home / Self Care | Payer: Medicare Other | Source: Ambulatory Visit | Attending: Orthopedic Surgery | Admitting: Orthopedic Surgery

## 2020-05-05 ENCOUNTER — Encounter (HOSPITAL_COMMUNITY): Payer: Self-pay

## 2020-05-05 ENCOUNTER — Other Ambulatory Visit: Payer: Self-pay

## 2020-05-05 DIAGNOSIS — Z01812 Encounter for preprocedural laboratory examination: Secondary | ICD-10-CM | POA: Diagnosis not present

## 2020-05-05 HISTORY — DX: Cardiac murmur, unspecified: R01.1

## 2020-05-05 HISTORY — DX: Heart failure, unspecified: I50.9

## 2020-05-05 LAB — COMPREHENSIVE METABOLIC PANEL
ALT: 17 U/L (ref 0–44)
AST: 23 U/L (ref 15–41)
Albumin: 4.2 g/dL (ref 3.5–5.0)
Alkaline Phosphatase: 55 U/L (ref 38–126)
Anion gap: 9 (ref 5–15)
BUN: 48 mg/dL — ABNORMAL HIGH (ref 8–23)
CO2: 24 mmol/L (ref 22–32)
Calcium: 9.7 mg/dL (ref 8.9–10.3)
Chloride: 104 mmol/L (ref 98–111)
Creatinine, Ser: 1.48 mg/dL — ABNORMAL HIGH (ref 0.44–1.00)
GFR, Estimated: 34 mL/min — ABNORMAL LOW (ref >60)
Glucose, Bld: 145 mg/dL — ABNORMAL HIGH (ref 70–99)
Potassium: 4.4 mmol/L (ref 3.5–5.1)
Sodium: 137 mmol/L (ref 135–145)
Total Bilirubin: 0.7 mg/dL (ref 0.3–1.2)
Total Protein: 7.4 g/dL (ref 6.5–8.1)

## 2020-05-05 LAB — CBC
HCT: 37.9 % (ref 36.0–46.0)
Hemoglobin: 12.7 g/dL (ref 12.0–15.0)
MCH: 35 pg — ABNORMAL HIGH (ref 26.0–34.0)
MCHC: 33.5 g/dL (ref 30.0–36.0)
MCV: 104.4 fL — ABNORMAL HIGH (ref 80.0–100.0)
Platelets: 158 10*3/uL (ref 150–400)
RBC: 3.63 MIL/uL — ABNORMAL LOW (ref 3.87–5.11)
RDW: 12.6 % (ref 11.5–15.5)
WBC: 6.3 10*3/uL (ref 4.0–10.5)
nRBC: 0 % (ref 0.0–0.2)

## 2020-05-05 LAB — APTT: aPTT: 30 seconds (ref 24–36)

## 2020-05-05 LAB — PROTIME-INR
INR: 1.1 (ref 0.8–1.2)
Prothrombin Time: 13.4 seconds (ref 11.4–15.2)

## 2020-05-05 LAB — HEMOGLOBIN A1C
Hgb A1c MFr Bld: 6.8 % — ABNORMAL HIGH (ref 4.8–5.6)
Mean Plasma Glucose: 148.46 mg/dL

## 2020-05-05 LAB — SURGICAL PCR SCREEN
MRSA, PCR: NEGATIVE
Staphylococcus aureus: NEGATIVE

## 2020-05-05 LAB — GLUCOSE, CAPILLARY: Glucose-Capillary: 122 mg/dL — ABNORMAL HIGH (ref 70–99)

## 2020-05-05 NOTE — Progress Notes (Addendum)
Anesthesia Review:  PCP: [name] Clearance- 03/10/20 on chart  Cardiologist : Dr Loleta Chance  03/20/20- LOV Daleen Snook kroeger,PA  Chest x-ray : Ct angio chest- 08/03/19 EKG : 03/20/20 Echo :04/21/20 Stress test: Cardiac Cath :  Activity level: can do a flight of stairs without difficulty  Sleep Study/ CPAP : no  Fasting Blood Sugar :      / Checks Blood Sugar -- times a day:   Blood Thinner/ Instructions /Last Dose: ASA / Instructions/ Last Dose :  81mg  Aspirin  DM- type 2  HGB!C-05/05/20- 6.8

## 2020-05-07 NOTE — H&P (Signed)
TOTAL HIP ADMISSION H&P  Patient is admitted for right total hip arthroplasty.  Subjective:  Chief Complaint: Right hip pain  HPI: Karen [name], 87 y.o. female, has a history of pain and functional disability in the right hip due to arthritis and patient has failed non-surgical conservative treatments for greater than 12 weeks to include corticosteriod injections and activity modification. Onset of symptoms was gradual, starting several years ago with gradually worsening course since that time. The patient noted no past surgery on the right hip. Patient currently rates pain in the right hip at 7 out of 10 with activity. Patient has night pain, worsening of pain with activity and weight bearing and pain that interfers with activities of daily living. Patient has evidence of periarticular osteophytes and joint space narrowing by imaging studies. This condition presents safety issues increasing the risk of falls. There is no current active infection.  Patient Active Problem List   Diagnosis Date Noted  . Bilateral leg weakness 07/15/2015  . Angina pectoris ([place]) 10/11/2013  . Dyspnea on exertion 10/09/2013  . Cardiomyopathy, mixed hypertensive and ischemic etiology 05/27/2013  . Hyperlipidemia 05/27/2013  . CAD (coronary artery disease) 05/27/2013  . Chronic combined systolic and diastolic congestive heart failure ([place]) 04/12/2013  . Aneurysm of thoracic aorta ([place]) 01/19/2013  . Altered mental status 05/16/2012  . Hallucinations 05/16/2012  . Hypokalemia 05/16/2012  . Diabetes mellitus ([place]) 05/16/2012  . Essential hypertension 05/16/2012    Past Medical History:  Diagnosis Date  . Arthritis   . CHF (congestive heart failure) ([place])   . Constipation   . Coronary artery disease 04/2013   Three-vessel disease, initial medical therapy then stenting of the LAD and CFX 10/2013  . Diabetes mellitus    TYPE 2  . Fibromyalgia   . H/O: gout   . Heart murmur   . Hx: UTI (urinary tract  infection)   . Hypertension     Past Surgical History:  Procedure Laterality Date  . APPENDECTOMY  1951  . [place]  . CARDIAC CATHETERIZATION  04/2013   Left main 30-40%, prox LAD 40%, mid/distal LAD 90%, CFX 70%, OM 3 90%, RCA 40-50%, PDA 20-80%, EF 50%   . CHOLECYSTECTOMY  1951  . Closed reduction of Left Total Hip  2008   x 3  . CORONARY STENT PLACEMENT  10/11/2013   2.25 x 20 mm Promus DES to mid/distal LAD & 3.5 x 12 mm Promus DES Mid CX         DR [place]ique  . [place]  . JOINT REPLACEMENT  left hip-2007, left knee-2000, right knee-1997   Left Hip, Bilateral Knee replacements  . KNEE SURGERY  1997/2000  . LEFT HEART CATHETERIZATION WITH CORONARY ANGIOGRAM N/A 04/25/2013   Procedure: LEFT HEART CATHETERIZATION WITH CORONARY ANGIOGRAM;  Surgeon: Sanda Klein, MD;  Location: [facility] CATH LAB;  Service: Cardiovascular;  Laterality: N/A;  . PERCUTANEOUS CORONARY STENT INTERVENTION (PCI-S) N/A 10/11/2013   Procedure: PERCUTANEOUS CORONARY STENT INTERVENTION (PCI-S);  Surgeon: Peter M Martinique, MD;  Location: [facility] CATH LAB;  Service: Cardiovascular;  Laterality: N/A;  . REPLACEMENT TOTAL HIP W/  RESURFACING IMPLANTS  [id number]  . Right Foot Surgery  2004   x 2  . Salivary Gland Stone Extraction    . SPINAL FUSION  2006    Prior to Admission medications   Medication Sig Start Date End Date Taking? Authorizing Provider  amLODipine (NORVASC) 5 MG tablet Take 1 tablet (5 mg total)  by mouth daily. Patient taking differently: Take 5 mg by mouth 2 (two) times daily. 12/18/13  Yes Burtis Junes, NP  aspirin 81 MG tablet Take 1 tablet (81 mg total) by mouth daily. 07/05/13  Yes Croitoru, Mihai, MD  atorvastatin (LIPITOR) 40 MG tablet Take 40 mg by mouth at bedtime.   Yes [provider]  Calcium Carbonate-Vitamin D 600-400 MG-UNIT tablet Take 1 tablet by mouth daily.   Yes [provider]  Coenzyme Q10 (CO Q 10 PO) Take 300 mg by mouth daily.   Yes [provider]  furosemide (LASIX) 40 MG tablet Take 1 tablet (40 mg total) by mouth daily. 06/07/17  Yes Croitoru, Mihai, MD  glipiZIDE (GLUCOTROL) 5 MG tablet Take 5 mg by mouth daily before breakfast.   Yes [provider]  Magnesium 250 MG TABS Take by mouth.   Yes [provider]  meloxicam (MOBIC) 7.5 MG tablet Take 7.5 mg by mouth daily.   Yes [provider]  metFORMIN (GLUCOPHAGE) 1000 MG tablet Take 1 tablet (1,000 mg total) by mouth 2 (two) times daily with a meal. Hold for 2 days, restart on 10/14/2013 10/12/13  Yes Barrett, Evelene Croon, PA-C  metoprolol succinate (TOPROL-XL) 50 MG 24 hr tablet Take 50 mg by mouth daily. Take with or immediately following a meal.   Yes [provider]  mirtazapine (REMERON) 15 MG tablet Take 15 mg by mouth at bedtime.   Yes [provider]  Multiple Vitamin (MULTIVITAMIN) tablet Take 1 tablet by mouth daily.   Yes [provider]  Polyethyl Glycol-Propyl Glycol (SYSTANE OP) Place 1 drop into both eyes daily as needed (dry eyes).   Yes [provider]  potassium chloride SA (K-DUR,KLOR-CON) 20 MEQ tablet Take 1 tablet (20 mEq total) by mouth 2 (two) times daily. 12/18/13  Yes Burtis Junes, NP  QUEtiapine (SEROQUEL) 200 MG tablet Take 200 mg by mouth at bedtime.   Yes [provider]  valsartan (DIOVAN) 320 MG tablet Take 1 tablet (320 mg total) by mouth daily. 09/13/13  Yes Croitoru, Dani Gobble, MD    Allergies  Allergen Reactions  . Lisinopril Cough  . Pneumococcal Vaccines Swelling and Other (See Comments)    Very bad pain and swelling in the arm of the shot, needed 2 cortisone shots.  . Ace Inhibitors Cough  . Carvedilol     Weakness, dizziness, upset stomach, trembling   . Codeine Nausea And Vomiting  . Morphine And Related Nausea And Vomiting  . Ultram [Tramadol Hcl] Itching    Social History   Socioeconomic History  . Marital status: Widowed    Spouse name: Not on file  .  Number of children: Not on file  . Years of education: Not on file  . Highest education level: Not on file  Occupational History  . Not on file  Tobacco Use  . Smoking status: Never Smoker  . Smokeless tobacco: Never Used  Vaping Use  . Vaping Use: Never used  Substance and Sexual Activity  . Alcohol use: No  . Drug use: No  . Sexual activity: Never  Other Topics Concern  . Not on file  Social History Narrative  . Not on file   Social Determinants of Health   Financial Resource Strain: Not on file  Food Insecurity: Not on file  Transportation Needs: Not on file  Physical Activity: Not on file  Stress: Not on file  Social Connections: Not on file  Intimate  Partner Violence: Not on file    Tobacco Use: Low Risk   . Smoking Tobacco Use: Never Smoker  . Smokeless Tobacco Use: Never Used   Social History   Substance and Sexual Activity  Alcohol Use No    Family History  Problem Relation Age of Onset  . Cancer Mother   . Heart attack Father   . Stroke Father   . Heart attack Brother   . Cancer Brother     Review of Systems  Constitutional: Negative for chills and fever.  HENT: Negative for congestion, sore throat and tinnitus.   Eyes: Negative for double vision, photophobia and pain.  Respiratory: Negative for cough, shortness of breath and wheezing.   Cardiovascular: Negative for chest pain, palpitations and orthopnea.  Gastrointestinal: Negative for heartburn, nausea and vomiting.  Genitourinary: Negative for dysuria, frequency and urgency.  Musculoskeletal: Positive for joint pain.  Neurological: Negative for dizziness, weakness and headaches.   Objective:  Physical Exam: Well nourished and well developed. General: Alert and oriented x3, cooperative and pleasant, no acute distress. Head: normocephalic, atraumatic, neck supple. Eyes: EOMI.  Musculoskeletal:  Right Hip Exam:  The range of motion: Flexion to 90 degrees, Internal Rotation to 0 degrees,  External Rotation to 10 degrees, and abduction to 10 degrees without discomfort.  There is no tenderness over the greater trochanteric bursa.  Calves soft and nontender. Motor function intact in LE. Strength 5/5 LE bilaterally. Neuro: Distal pulses 2+. Sensation to light touch intact in LE.  Imaging Review Plain radiographs demonstrate severe degenerative joint disease of the right hip. The bone quality appears to be adequate for age and reported activity level.  Assessment/Plan:  End stage arthritis, right hip  The patient history, physical examination, clinical judgement of the provider and imaging studies are consistent with end stage degenerative joint disease of the right hip and total hip arthroplasty is deemed medically necessary. The treatment options including medical management, injection therapy, arthroscopy and arthroplasty were discussed at length. The risks and benefits of total hip arthroplasty were presented and reviewed. The risks due to aseptic loosening, infection, stiffness, dislocation/subluxation, thromboembolic complications and other imponderables were discussed. The patient acknowledged the explanation, agreed to proceed with the plan and consent was signed. Patient is being admitted for inpatient treatment for surgery, pain control, PT, OT, prophylactic antibiotics, VTE prophylaxis, progressive ambulation and ADLs and discharge planning.The patient is planning to be discharged home.   Patient's anticipated LOS is less than 2 midnights, meeting these requirements: - Younger than 38 - Lives within 1 hour of care - Has a competent adult at home to recover with post-op recover - NO history of  - Chronic pain requiring opiods  - Diabetes  - Coronary Artery Disease  - Heart failure  - Heart attack  - Stroke  - DVT/VTE  - Cardiac arrhythmia  - Respiratory Failure/COPD  - Renal failure  - Anemia  - Advanced Liver disease  Therapy Plans: HEP Disposition: Home with  son Planned DVT Prophylaxis: aspirin 325mg  BID DME needed: none PCP: Dr. [name], clearance received  Cardiologist: Dr. Sallyanne Kuster, clearance received TXA: IV Allergies: Carvedilol - **severe reaction** SHOB, nausea, hypertension, morphine/codeine - vomiting, tramadol - itching, old pneumonia vaccine - swelling Anesthesia Concerns: none BMI: 24.4 Last HgbA1c: 7.4% in November 2021 (rechecking with preop labs)  Other: - DO NOT interrupt beta blocker in the peri-operative period per Cardiology  - Patient was instructed on what medications to stop prior to surgery. - Follow-up visit  in 2 weeks with Dr. Wynelle Link - Begin physical therapy following surgery - Pre-operative lab work as pre-surgical testing - Prescriptions will be provided in hospital at time of discharge  Theresa Duty, PA-C Orthopedic Surgery [facility] [place]

## 2020-05-07 NOTE — Progress Notes (Signed)
Anesthesia Chart Review   Case: [id number] Date/Time: 05/14/20 1320   Procedure: TOTAL HIP ARTHROPLASTY ANTERIOR APPROACH (Right Hip) - 174min   Anesthesia type: Choice   Pre-op diagnosis: right hip osteoarthritis   Location: [facility] ROOM 09 / [facility]   Surgeons: Gaynelle Arabian, MD      DISCUSSION:87 y.o. never smoker with h/o HTN, DM II, CHF, CAD (DES to LAD and LCx 2015), right hip OA scheduled for above procedure 05/14/2020 with Dr. Gaynelle Arabian.   Pt last seen by cardiology 03/20/2020 for preoperative evaluation. Per OV note, "Preop assessment: patient is anticipating a hip surgery 05/14/2020 with Dr. Wynelle Link. He hip pain has limited her activity significantly over the past several months/year. She is able to do some activities without anginal complaints, though cannot quite complete 4 METs. She had no significant cardiac complaints today. She does have a murmur on exam with prior history of mild MR/mild AI.  - Will plan to update an echocardiogram prior to surgery  - Do not feel strongly about an ischemic evaluation at this time. Will route to Dr. Sallyanne Kuster for input, though if echo is overall unchanged, do not feel further cardiac work-up will be necessary."  Per Dr. Victorino December note 03/20/20, "I agree with ordering the echocardiogram, it is possible that her aortic insufficiency has progressed since she did have a dilated aorta.  On the other hand, I doubt that we will ever recommend surgery on her aorta.  I do not think she needs to delay her orthopedic procedure for the echo. But I would make it clear in the note that her beta-blocker (carvedilol) should not be interrupted in the perioperative period."  Echo 04/11/20 with EF 60-65%, moderate mitral stenosis, mild aortic regurgitaiton, no aortic stenosis.  VS: BP (!) 166/68   Pulse 83   Temp 37 C (Oral)   Resp 16   LMP 05/10/1998   SpO2 100%   PROVIDERS: [name], DO is PCP   Croitoru, Dani Gobble, MD is Cardiologist  LABS: Labs reviewed:  Acceptable for surgery. (all labs ordered are listed, but only abnormal results are displayed)  Labs Reviewed  CBC - Abnormal; Notable for the following components:      Result Value   RBC 3.63 (*)    MCV 104.4 (*)    MCH 35.0 (*)    All other components within normal limits  COMPREHENSIVE METABOLIC PANEL - Abnormal; Notable for the following components:   Glucose, Bld 145 (*)    BUN 48 (*)    Creatinine, Ser 1.48 (*)    GFR, Estimated 34 (*)    All other components within normal limits  GLUCOSE, CAPILLARY - Abnormal; Notable for the following components:   Glucose-Capillary 122 (*)    All other components within normal limits  HEMOGLOBIN A1C - Abnormal; Notable for the following components:   Hgb A1c MFr Bld 6.8 (*)    All other components within normal limits  SURGICAL PCR SCREEN  PROTIME-INR  APTT  TYPE AND SCREEN     IMAGES:   EKG: 03/20/2020 Rate 83 bpm  NSR Possible left atrial enlargement   CV: Echo 04/11/20 1. Left ventricular ejection fraction, by estimation, is 60 to 65%. The  left ventricle has normal function. The left ventricle has no regional  wall motion abnormalities. There is severe asymmetric left ventricular  hypertrophy of the basal-septal  segment. Left ventricular diastolic parameters are indeterminate.  2. Right ventricular systolic function is normal. The right ventricular  size is normal.  There is normal pulmonary artery systolic pressure. The  estimated right ventricular systolic pressure is 61.4 mmHg.  3. The mitral valve is abnormal. Trivial mitral valve regurgitation.  Moderate mitral stenosis. Severe mitral annular calcification. MG 67mmHg at  HR 82 bpm, MVA 1.1 cm^2 by continuity equation  4. The aortic valve is tricuspid. Aortic valve regurgitation is mild.  Mild to moderate aortic valve sclerosis/calcification is present, without  any evidence of aortic stenosis.  5. Aortic dilatation noted. Aneurysm of the ascending aorta,  measuring 45  mm.  6. The inferior vena cava is normal in size with greater than 50%  respiratory variability, suggesting right atrial pressure of 3 mmHg. Past Medical History:  Diagnosis Date  . Arthritis   . CHF (congestive heart failure) ([place])   . Constipation   . Coronary artery disease 04/2013   Three-vessel disease, initial medical therapy then stenting of the LAD and CFX 10/2013  . Diabetes mellitus    TYPE 2  . Fibromyalgia   . H/O: gout   . Heart murmur   . Hx: UTI (urinary tract infection)   . Hypertension     Past Surgical History:  Procedure Laterality Date  . APPENDECTOMY  1951  . [place]  . CARDIAC CATHETERIZATION  04/2013   Left main 30-40%, prox LAD 40%, mid/distal LAD 90%, CFX 70%, OM 3 90%, RCA 40-50%, PDA 20-80%, EF 50%   . CHOLECYSTECTOMY  1951  . Closed reduction of Left Total Hip  2008   x 3  . CORONARY STENT PLACEMENT  10/11/2013   2.25 x 20 mm Promus DES to mid/distal LAD & 3.5 x 12 mm Promus DES Mid CX         DR Martinique  . [place]  . JOINT REPLACEMENT  left hip-2007, left knee-2000, right knee-1997   Left Hip, Bilateral Knee replacements  . KNEE SURGERY  1997/2000  . LEFT HEART CATHETERIZATION WITH CORONARY ANGIOGRAM N/A 04/25/2013   Procedure: LEFT HEART CATHETERIZATION WITH CORONARY ANGIOGRAM;  Surgeon: Sanda Klein, MD;  Location: [facility] CATH LAB;  Service: Cardiovascular;  Laterality: N/A;  . PERCUTANEOUS CORONARY STENT INTERVENTION (PCI-S) N/A 10/11/2013   Procedure: PERCUTANEOUS CORONARY STENT INTERVENTION (PCI-S);  Surgeon: Peter M Martinique, MD;  Location: [facility] CATH LAB;  Service: Cardiovascular;  Laterality: N/A;  . REPLACEMENT TOTAL HIP W/  RESURFACING IMPLANTS  [id number]  . Right Foot Surgery  2004   x 2  . Salivary Gland Stone Extraction    . SPINAL FUSION  2006    MEDICATIONS: . amLODipine (NORVASC) 5 MG tablet  . aspirin 81 MG tablet  . atorvastatin (LIPITOR) 40 MG tablet  . Calcium Carbonate-Vitamin D 600-400  MG-UNIT tablet  . Coenzyme Q10 (CO Q 10 PO)  . furosemide (LASIX) 40 MG tablet  . glipiZIDE (GLUCOTROL) 5 MG tablet  . Magnesium 250 MG TABS  . meloxicam (MOBIC) 7.5 MG tablet  . metFORMIN (GLUCOPHAGE) 1000 MG tablet  . metoprolol succinate (TOPROL-XL) 50 MG 24 hr tablet  . mirtazapine (REMERON) 15 MG tablet  . Multiple Vitamin (MULTIVITAMIN) tablet  . Polyethyl Glycol-Propyl Glycol (SYSTANE OP)  . potassium chloride SA (K-DUR,KLOR-CON) 20 MEQ tablet  . QUEtiapine (SEROQUEL) 200 MG tablet  . valsartan (DIOVAN) 320 MG tablet   No current facility-administered medications for this encounter.    Konrad Felix, PA-C [facility]-Surgical Testing [phone]

## 2020-05-12 ENCOUNTER — Other Ambulatory Visit (HOSPITAL_COMMUNITY)
Admission: RE | Admit: 2020-05-12 | Discharge: 2020-05-12 | Disposition: A | Discharge status: Home / Self Care | Payer: Medicare Other | Source: Ambulatory Visit | Attending: Orthopedic Surgery | Admitting: Orthopedic Surgery

## 2020-05-12 DIAGNOSIS — Z20822 Contact with and (suspected) exposure to covid-19: Secondary | ICD-10-CM | POA: Diagnosis not present

## 2020-05-12 DIAGNOSIS — Z01812 Encounter for preprocedural laboratory examination: Secondary | ICD-10-CM | POA: Diagnosis not present

## 2020-05-13 LAB — SARS CORONAVIRUS 2 (TAT 6-24 HRS): SARS Coronavirus 2: NEGATIVE

## 2020-05-14 ENCOUNTER — Ambulatory Visit (HOSPITAL_COMMUNITY): Payer: Medicare Other

## 2020-05-14 ENCOUNTER — Ambulatory Visit (HOSPITAL_COMMUNITY): Payer: Medicare Other | Admitting: Physician Assistant

## 2020-05-14 ENCOUNTER — Encounter (HOSPITAL_COMMUNITY): Payer: Self-pay | Admitting: Orthopedic Surgery

## 2020-05-14 ENCOUNTER — Encounter (HOSPITAL_COMMUNITY)
Admission: RE | Disposition: A | Discharge status: Home / Self Care | Payer: Self-pay | Source: Ambulatory Visit | Attending: Orthopedic Surgery

## 2020-05-14 ENCOUNTER — Observation Stay (HOSPITAL_COMMUNITY)
Admission: RE | Admit: 2020-05-14 | Discharge: 2020-05-15 | Disposition: A | Discharge status: Home / Self Care | Payer: Medicare Other | Source: Ambulatory Visit | Attending: Orthopedic Surgery | Admitting: Orthopedic Surgery

## 2020-05-14 ENCOUNTER — Other Ambulatory Visit: Payer: Self-pay

## 2020-05-14 ENCOUNTER — Observation Stay (HOSPITAL_COMMUNITY): Payer: Medicare Other

## 2020-05-14 ENCOUNTER — Ambulatory Visit (HOSPITAL_COMMUNITY): Payer: Medicare Other | Admitting: Anesthesiology

## 2020-05-14 DIAGNOSIS — I110 Hypertensive heart disease with heart failure: Secondary | ICD-10-CM | POA: Insufficient documentation

## 2020-05-14 DIAGNOSIS — Z79899 Other long term (current) drug therapy: Secondary | ICD-10-CM | POA: Diagnosis not present

## 2020-05-14 DIAGNOSIS — Z7984 Long term (current) use of oral hypoglycemic drugs: Secondary | ICD-10-CM | POA: Diagnosis not present

## 2020-05-14 DIAGNOSIS — M1611 Unilateral primary osteoarthritis, right hip: Principal | ICD-10-CM | POA: Diagnosis present

## 2020-05-14 DIAGNOSIS — E119 Type 2 diabetes mellitus without complications: Secondary | ICD-10-CM | POA: Insufficient documentation

## 2020-05-14 DIAGNOSIS — I2510 Atherosclerotic heart disease of native coronary artery without angina pectoris: Secondary | ICD-10-CM | POA: Diagnosis not present

## 2020-05-14 DIAGNOSIS — M169 Osteoarthritis of hip, unspecified: Secondary | ICD-10-CM | POA: Diagnosis present

## 2020-05-14 DIAGNOSIS — Z419 Encounter for procedure for purposes other than remedying health state, unspecified: Secondary | ICD-10-CM

## 2020-05-14 DIAGNOSIS — Z7982 Long term (current) use of aspirin: Secondary | ICD-10-CM | POA: Insufficient documentation

## 2020-05-14 DIAGNOSIS — E876 Hypokalemia: Secondary | ICD-10-CM | POA: Diagnosis not present

## 2020-05-14 DIAGNOSIS — I5042 Chronic combined systolic (congestive) and diastolic (congestive) heart failure: Secondary | ICD-10-CM | POA: Diagnosis not present

## 2020-05-14 DIAGNOSIS — Z96649 Presence of unspecified artificial hip joint: Secondary | ICD-10-CM

## 2020-05-14 DIAGNOSIS — Z471 Aftercare following joint replacement surgery: Secondary | ICD-10-CM | POA: Diagnosis not present

## 2020-05-14 DIAGNOSIS — Z96641 Presence of right artificial hip joint: Secondary | ICD-10-CM | POA: Diagnosis not present

## 2020-05-14 HISTORY — PX: TOTAL HIP ARTHROPLASTY: SHX124

## 2020-05-14 LAB — HEMOGLOBIN A1C
Hgb A1c MFr Bld: 6.5 % — ABNORMAL HIGH (ref 4.8–5.6)
Mean Plasma Glucose: 139.85 mg/dL

## 2020-05-14 LAB — GLUCOSE, CAPILLARY
Glucose-Capillary: 130 mg/dL — ABNORMAL HIGH (ref 70–99)
Glucose-Capillary: 142 mg/dL — ABNORMAL HIGH (ref 70–99)
Glucose-Capillary: 190 mg/dL — ABNORMAL HIGH (ref 70–99)
Glucose-Capillary: 293 mg/dL — ABNORMAL HIGH (ref 70–99)

## 2020-05-14 LAB — TYPE AND SCREEN
ABO/RH(D): A POS
Antibody Screen: NEGATIVE

## 2020-05-14 SURGERY — ARTHROPLASTY, HIP, TOTAL, ANTERIOR APPROACH
Anesthesia: General | Site: Hip | Laterality: Right

## 2020-05-14 MED ORDER — HYDROCODONE-ACETAMINOPHEN 5-325 MG PO TABS
1.0000 | ORAL_TABLET | ORAL | Status: DC | PRN
  Administered 2020-05-14 (×2): 1 via ORAL
  Administered 2020-05-15: 2 via ORAL
  Administered 2020-05-15: 1 via ORAL
  Filled 2020-05-14: qty 2
  Filled 2020-05-14 (×3): qty 1

## 2020-05-14 MED ORDER — METHOCARBAMOL 500 MG IVPB - SIMPLE MED
500.0000 mg | Freq: Four times a day (QID) | INTRAVENOUS | Status: DC | PRN
  Administered 2020-05-14: 500 mg via INTRAVENOUS
  Filled 2020-05-14: qty 50

## 2020-05-14 MED ORDER — ROCURONIUM BROMIDE 10 MG/ML (PF) SYRINGE
PREFILLED_SYRINGE | INTRAVENOUS | Status: DC | PRN
  Administered 2020-05-14: 50 mg via INTRAVENOUS

## 2020-05-14 MED ORDER — ONDANSETRON HCL 4 MG/2ML IJ SOLN
INTRAMUSCULAR | Status: AC
  Filled 2020-05-14: qty 2

## 2020-05-14 MED ORDER — FENTANYL CITRATE (PF) 100 MCG/2ML IJ SOLN
INTRAMUSCULAR | Status: AC
  Filled 2020-05-14: qty 2

## 2020-05-14 MED ORDER — ATORVASTATIN CALCIUM 40 MG PO TABS
40.0000 mg | ORAL_TABLET | Freq: Every day | ORAL | Status: DC
  Administered 2020-05-14: 40 mg via ORAL
  Filled 2020-05-14: qty 1

## 2020-05-14 MED ORDER — CEFAZOLIN SODIUM-DEXTROSE 2-4 GM/100ML-% IV SOLN
2.0000 g | INTRAVENOUS | Status: AC
  Administered 2020-05-14: 2 g via INTRAVENOUS
  Filled 2020-05-14: qty 100

## 2020-05-14 MED ORDER — QUETIAPINE FUMARATE 50 MG PO TABS
200.0000 mg | ORAL_TABLET | Freq: Every day | ORAL | Status: DC
  Administered 2020-05-14: 200 mg via ORAL
  Filled 2020-05-14: qty 4

## 2020-05-14 MED ORDER — METOCLOPRAMIDE HCL 5 MG/ML IJ SOLN: 5.0000 mg | Freq: Three times a day (TID) | INTRAMUSCULAR | Status: DC | PRN

## 2020-05-14 MED ORDER — PROPOFOL 500 MG/50ML IV EMUL
INTRAVENOUS | Status: AC
  Filled 2020-05-14: qty 50

## 2020-05-14 MED ORDER — SODIUM CHLORIDE 0.9 % IV SOLN: INTRAVENOUS | Status: DC

## 2020-05-14 MED ORDER — POTASSIUM CHLORIDE CRYS ER 20 MEQ PO TBCR
20.0000 meq | EXTENDED_RELEASE_TABLET | Freq: Two times a day (BID) | ORAL | Status: DC
  Administered 2020-05-14 – 2020-05-15 (×2): 20 meq via ORAL
  Filled 2020-05-14 (×2): qty 1

## 2020-05-14 MED ORDER — INSULIN ASPART 100 UNIT/ML ~~LOC~~ SOLN
0.0000 [IU] | Freq: Three times a day (TID) | SUBCUTANEOUS | Status: DC
  Administered 2020-05-15: 3 [IU] via SUBCUTANEOUS
  Administered 2020-05-15: 5 [IU] via SUBCUTANEOUS

## 2020-05-14 MED ORDER — METOPROLOL SUCCINATE ER 50 MG PO TB24
50.0000 mg | ORAL_TABLET | Freq: Every day | ORAL | Status: DC
  Administered 2020-05-15: 50 mg via ORAL
  Filled 2020-05-14: qty 1

## 2020-05-14 MED ORDER — SUGAMMADEX SODIUM 200 MG/2ML IV SOLN
INTRAVENOUS | Status: DC | PRN
  Administered 2020-05-14: 150 mg via INTRAVENOUS

## 2020-05-14 MED ORDER — DOCUSATE SODIUM 100 MG PO CAPS
100.0000 mg | ORAL_CAPSULE | Freq: Two times a day (BID) | ORAL | Status: DC
  Administered 2020-05-14 – 2020-05-15 (×2): 100 mg via ORAL
  Filled 2020-05-14 (×2): qty 1

## 2020-05-14 MED ORDER — MORPHINE SULFATE (PF) 2 MG/ML IV SOLN: 0.5000 mg | INTRAVENOUS | Status: DC | PRN

## 2020-05-14 MED ORDER — METOCLOPRAMIDE HCL 5 MG PO TABS: 5.0000 mg | ORAL_TABLET | Freq: Three times a day (TID) | ORAL | Status: DC | PRN

## 2020-05-14 MED ORDER — PHENOL 1.4 % MT LIQD
1.0000 | OROMUCOSAL | Status: DC | PRN
Start: 2020-05-14 — End: 2020-05-15

## 2020-05-14 MED ORDER — PROPOFOL 10 MG/ML IV BOLUS
INTRAVENOUS | Status: DC | PRN
  Administered 2020-05-14: 90 mg via INTRAVENOUS

## 2020-05-14 MED ORDER — MAGNESIUM CITRATE PO SOLN: 1.0000 | Freq: Once | ORAL | Status: DC | PRN

## 2020-05-14 MED ORDER — METHOCARBAMOL 500 MG IVPB - SIMPLE MED
INTRAVENOUS | Status: AC
  Filled 2020-05-14: qty 50

## 2020-05-14 MED ORDER — PHENYLEPHRINE 40 MCG/ML (10ML) SYRINGE FOR IV PUSH (FOR BLOOD PRESSURE SUPPORT)
PREFILLED_SYRINGE | INTRAVENOUS | Status: DC | PRN
  Administered 2020-05-14 (×3): 80 ug via INTRAVENOUS

## 2020-05-14 MED ORDER — BUPIVACAINE HCL 0.25 % IJ SOLN
INTRAMUSCULAR | Status: DC | PRN
  Administered 2020-05-14: 30 mL

## 2020-05-14 MED ORDER — ONDANSETRON HCL 4 MG PO TABS: 4.0000 mg | ORAL_TABLET | Freq: Four times a day (QID) | ORAL | Status: DC | PRN

## 2020-05-14 MED ORDER — POVIDONE-IODINE 10 % EX SWAB
2.0000 "application " | Freq: Once | CUTANEOUS | Status: AC
  Administered 2020-05-14: 2 via TOPICAL

## 2020-05-14 MED ORDER — ONDANSETRON HCL 4 MG/2ML IJ SOLN: 4.0000 mg | Freq: Four times a day (QID) | INTRAMUSCULAR | Status: DC | PRN

## 2020-05-14 MED ORDER — ONDANSETRON HCL 4 MG/2ML IJ SOLN
INTRAMUSCULAR | Status: DC | PRN
  Administered 2020-05-14: 4 mg via INTRAVENOUS

## 2020-05-14 MED ORDER — DEXAMETHASONE SODIUM PHOSPHATE 10 MG/ML IJ SOLN
10.0000 mg | Freq: Once | INTRAMUSCULAR | Status: AC
  Administered 2020-05-15: 10 mg via INTRAVENOUS
  Filled 2020-05-14: qty 1

## 2020-05-14 MED ORDER — LIDOCAINE 2% (20 MG/ML) 5 ML SYRINGE
INTRAMUSCULAR | Status: DC | PRN
  Administered 2020-05-14: 60 mg via INTRAVENOUS

## 2020-05-14 MED ORDER — ASPIRIN EC 325 MG PO TBEC
325.0000 mg | DELAYED_RELEASE_TABLET | Freq: Two times a day (BID) | ORAL | Status: DC
  Administered 2020-05-15: 325 mg via ORAL
  Filled 2020-05-14: qty 1

## 2020-05-14 MED ORDER — FUROSEMIDE 40 MG PO TABS
40.0000 mg | ORAL_TABLET | Freq: Every day | ORAL | Status: DC
  Administered 2020-05-15: 40 mg via ORAL
  Filled 2020-05-14: qty 1

## 2020-05-14 MED ORDER — IRBESARTAN 150 MG PO TABS
300.0000 mg | ORAL_TABLET | Freq: Every day | ORAL | Status: DC
  Administered 2020-05-15: 300 mg via ORAL
  Filled 2020-05-14 (×2): qty 2

## 2020-05-14 MED ORDER — INSULIN ASPART 100 UNIT/ML ~~LOC~~ SOLN
0.0000 [IU] | Freq: Every day | SUBCUTANEOUS | Status: DC
  Administered 2020-05-14: 3 [IU] via SUBCUTANEOUS

## 2020-05-14 MED ORDER — MIRTAZAPINE 15 MG PO TABS
15.0000 mg | ORAL_TABLET | Freq: Every day | ORAL | Status: DC
  Administered 2020-05-14: 15 mg via ORAL
  Filled 2020-05-14: qty 1

## 2020-05-14 MED ORDER — ONDANSETRON HCL 4 MG/2ML IJ SOLN
4.0000 mg | Freq: Once | INTRAMUSCULAR | Status: AC | PRN
  Administered 2020-05-14: 4 mg via INTRAVENOUS

## 2020-05-14 MED ORDER — CEFAZOLIN SODIUM-DEXTROSE 2-4 GM/100ML-% IV SOLN
2.0000 g | Freq: Four times a day (QID) | INTRAVENOUS | Status: AC
  Administered 2020-05-14 – 2020-05-15 (×2): 2 g via INTRAVENOUS
  Filled 2020-05-14 (×2): qty 100

## 2020-05-14 MED ORDER — DEXAMETHASONE SODIUM PHOSPHATE 10 MG/ML IJ SOLN
8.0000 mg | Freq: Once | INTRAMUSCULAR | Status: AC
  Administered 2020-05-14: 8 mg via INTRAVENOUS

## 2020-05-14 MED ORDER — POLYETHYLENE GLYCOL 3350 17 G PO PACK: 17.0000 g | PACK | Freq: Every day | ORAL | Status: DC | PRN

## 2020-05-14 MED ORDER — 0.9 % SODIUM CHLORIDE (POUR BTL) OPTIME
TOPICAL | Status: DC | PRN
  Administered 2020-05-14: 1000 mL

## 2020-05-14 MED ORDER — AMLODIPINE BESYLATE 5 MG PO TABS
5.0000 mg | ORAL_TABLET | Freq: Two times a day (BID) | ORAL | Status: DC
  Administered 2020-05-15: 5 mg via ORAL
  Filled 2020-05-14: qty 1

## 2020-05-14 MED ORDER — ACETAMINOPHEN 10 MG/ML IV SOLN
1000.0000 mg | Freq: Four times a day (QID) | INTRAVENOUS | Status: DC
  Administered 2020-05-14: 1000 mg via INTRAVENOUS
  Filled 2020-05-14: qty 100

## 2020-05-14 MED ORDER — BISACODYL 10 MG RE SUPP: 10.0000 mg | Freq: Every day | RECTAL | Status: DC | PRN

## 2020-05-14 MED ORDER — ACETAMINOPHEN 325 MG PO TABS
325.0000 mg | ORAL_TABLET | Freq: Four times a day (QID) | ORAL | Status: DC | PRN
  Administered 2020-05-15: 325 mg via ORAL
  Filled 2020-05-14: qty 1

## 2020-05-14 MED ORDER — GLIPIZIDE 5 MG PO TABS
5.0000 mg | ORAL_TABLET | Freq: Every day | ORAL | Status: DC
  Administered 2020-05-15: 5 mg via ORAL
  Filled 2020-05-14: qty 1

## 2020-05-14 MED ORDER — BUPIVACAINE HCL (PF) 0.25 % IJ SOLN
INTRAMUSCULAR | Status: AC
  Filled 2020-05-14: qty 30

## 2020-05-14 MED ORDER — LACTATED RINGERS IV SOLN: INTRAVENOUS | Status: DC

## 2020-05-14 MED ORDER — PROPOFOL 10 MG/ML IV BOLUS
INTRAVENOUS | Status: AC
  Filled 2020-05-14: qty 20

## 2020-05-14 MED ORDER — METHOCARBAMOL 500 MG PO TABS
500.0000 mg | ORAL_TABLET | Freq: Four times a day (QID) | ORAL | Status: DC | PRN
  Administered 2020-05-14 – 2020-05-15 (×2): 500 mg via ORAL
  Filled 2020-05-14 (×2): qty 1

## 2020-05-14 MED ORDER — FENTANYL CITRATE (PF) 100 MCG/2ML IJ SOLN
INTRAMUSCULAR | Status: DC | PRN
  Administered 2020-05-14 (×2): 50 ug via INTRAVENOUS
  Administered 2020-05-14: 25 ug via INTRAVENOUS
  Administered 2020-05-14: 50 ug via INTRAVENOUS
  Administered 2020-05-14: 25 ug via INTRAVENOUS

## 2020-05-14 MED ORDER — MENTHOL 3 MG MT LOZG: 1.0000 | LOZENGE | OROMUCOSAL | Status: DC | PRN

## 2020-05-14 MED ORDER — ROCURONIUM BROMIDE 10 MG/ML (PF) SYRINGE
PREFILLED_SYRINGE | INTRAVENOUS | Status: AC
  Filled 2020-05-14: qty 10

## 2020-05-14 MED ORDER — DEXAMETHASONE SODIUM PHOSPHATE 10 MG/ML IJ SOLN
INTRAMUSCULAR | Status: AC
  Filled 2020-05-14: qty 1

## 2020-05-14 MED ORDER — PHENYLEPHRINE HCL-NACL 10-0.9 MG/250ML-% IV SOLN
INTRAVENOUS | Status: AC
  Filled 2020-05-14: qty 250

## 2020-05-14 MED ORDER — TRANEXAMIC ACID-NACL 1000-0.7 MG/100ML-% IV SOLN
1000.0000 mg | INTRAVENOUS | Status: AC
  Administered 2020-05-14: 1000 mg via INTRAVENOUS
  Filled 2020-05-14: qty 100

## 2020-05-14 MED ORDER — FENTANYL CITRATE (PF) 100 MCG/2ML IJ SOLN
25.0000 ug | INTRAMUSCULAR | Status: DC | PRN
  Administered 2020-05-14 (×4): 50 ug via INTRAVENOUS

## 2020-05-14 MED ORDER — LIDOCAINE 2% (20 MG/ML) 5 ML SYRINGE
INTRAMUSCULAR | Status: AC
  Filled 2020-05-14: qty 5

## 2020-05-14 SURGICAL SUPPLY — 41 items
BAG DECANTER FOR FLEXI CONT (MISCELLANEOUS) IMPLANT
BAG ZIPLOCK 12X15 (MISCELLANEOUS) IMPLANT
BLADE SAG 18X100X1.27 (BLADE) ×2 IMPLANT
COVER PERINEAL POST (MISCELLANEOUS) ×2 IMPLANT
COVER SURGICAL LIGHT HANDLE (MISCELLANEOUS) ×2 IMPLANT
COVER WAND RF STERILE (DRAPES) IMPLANT
CUP ACET PINNACLE SECTR 50MM (Hips) ×1 IMPLANT
DECANTER SPIKE VIAL GLASS SM (MISCELLANEOUS) ×2 IMPLANT
DRAPE STERI IOBAN 125X83 (DRAPES) ×2 IMPLANT
DRAPE U-SHAPE 47X51 STRL (DRAPES) ×4 IMPLANT
DRSG AQUACEL AG ADV 3.5X10 (GAUZE/BANDAGES/DRESSINGS) ×2 IMPLANT
DURAPREP 26ML APPLICATOR (WOUND CARE) ×2 IMPLANT
ELECT REM PT RETURN 15FT ADLT (MISCELLANEOUS) ×2 IMPLANT
EVACUATOR 1/8 PVC DRAIN (DRAIN) IMPLANT
GLOVE SRG 8 PF TXTR STRL LF DI (GLOVE) ×1 IMPLANT
GLOVE SURG ENC MOIS LTX SZ6.5 (GLOVE) ×2 IMPLANT
GLOVE SURG ENC MOIS LTX SZ8 (GLOVE) ×4 IMPLANT
GLOVE SURG UNDER POLY LF SZ7 (GLOVE) IMPLANT
GLOVE SURG UNDER POLY LF SZ8 (GLOVE) ×2
GLOVE SURG UNDER POLY LF SZ8.5 (GLOVE) IMPLANT
GOWN STRL REUS W/TWL LRG LVL3 (GOWN DISPOSABLE) ×2 IMPLANT
GOWN STRL REUS W/TWL XL LVL3 (GOWN DISPOSABLE) IMPLANT
HEAD FEM STD 32X+1 STRL (Hips) ×2 IMPLANT
HOLDER FOLEY CATH W/STRAP (MISCELLANEOUS) ×2 IMPLANT
KIT TURNOVER KIT A (KITS) ×2 IMPLANT
LINER MARATHON 32 50 (Hips) ×2 IMPLANT
MANIFOLD NEPTUNE II (INSTRUMENTS) ×2 IMPLANT
PACK ANTERIOR HIP CUSTOM (KITS) ×2 IMPLANT
PENCIL SMOKE EVACUATOR COATED (MISCELLANEOUS) ×2 IMPLANT
PINNACLE SECTOR CUP 50MM (Hips) ×2 IMPLANT
STEM FEM ACTIS HIGH SZ2 (Stem) ×2 IMPLANT
STRIP CLOSURE SKIN 1/2X4 (GAUZE/BANDAGES/DRESSINGS) ×2 IMPLANT
SUT ETHIBOND NAB CT1 #1 30IN (SUTURE) ×2 IMPLANT
SUT MNCRL AB 4-0 PS2 18 (SUTURE) ×2 IMPLANT
SUT STRATAFIX 0 PDS 27 VIOLET (SUTURE) ×2
SUT VIC AB 2-0 CT1 27 (SUTURE) ×4
SUT VIC AB 2-0 CT1 TAPERPNT 27 (SUTURE) ×2 IMPLANT
SUTURE STRATFX 0 PDS 27 VIOLET (SUTURE) ×1 IMPLANT
SYR 50ML LL SCALE MARK (SYRINGE) IMPLANT
TRAY FOLEY MTR SLVR 16FR STAT (SET/KITS/TRAYS/PACK) ×2 IMPLANT
TUBE SUCTION HIGH CAP CLEAR NV (SUCTIONS) ×2 IMPLANT

## 2020-05-14 NOTE — Anesthesia Postprocedure Evaluation (Signed)
Anesthesia Post Note  Patient: Karen [name]  Procedure(s) Performed: TOTAL HIP ARTHROPLASTY ANTERIOR APPROACH (Right Hip)     Patient location during evaluation: PACU Anesthesia Type: General Level of consciousness: awake and alert Pain management: pain level controlled Vital Signs Assessment: post-procedure vital signs reviewed and stable Respiratory status: spontaneous breathing, nonlabored ventilation, respiratory function stable and patient connected to nasal cannula oxygen Cardiovascular status: blood pressure returned to baseline and stable Postop Assessment: no apparent nausea or vomiting Anesthetic complications: no   No complications documented.  Last Vitals:  Vitals:   05/14/20 1615 05/14/20 1632  BP: (!) 171/64 (!) 143/81  Pulse: 77 81  Resp: 13 18  Temp: 36.8 C 36.7 C  SpO2: 100% 100%    Last Pain:  Vitals:   05/14/20 1632  TempSrc: Oral  PainSc: 3                  Audry Pili

## 2020-05-14 NOTE — Plan of Care (Signed)
  Problem: Education: Goal: Knowledge of General Education information will improve Description: Including pain rating scale, medication(s)/side effects and non-pharmacologic comfort measures Outcome: Progressing   Problem: Activity: Goal: Risk for activity intolerance will decrease Outcome: Progressing   Problem: Elimination: Goal: Will not experience complications related to bowel motility Outcome: Progressing   Problem: Safety: Goal: Ability to remain free from injury will improve Outcome: Progressing   Problem: Education: Goal: Knowledge of the prescribed therapeutic regimen will improve Outcome: Progressing   Problem: Activity: Goal: Ability to avoid complications of mobility impairment will improve Outcome: Progressing Goal: Ability to tolerate increased activity will improve Outcome: Progressing   Problem: Clinical Measurements: Goal: Postoperative complications will be avoided or minimized Outcome: Progressing   Problem: Pain Management: Goal: Pain level will decrease with appropriate interventions Outcome: Progressing   Problem: Skin Integrity: Goal: Will show signs of wound healing Outcome: Progressing

## 2020-05-14 NOTE — Discharge Instructions (Signed)
Karen Arabian, MD Total Joint Specialist [facility] [place] [address], [address] [place], [place] [address] [phone]  ANTERIOR APPROACH TOTAL HIP REPLACEMENT POSTOPERATIVE DIRECTIONS     Hip Rehabilitation, Guidelines Following Surgery  The results of a hip operation are greatly improved after range of motion and muscle strengthening exercises. Follow all safety measures which are given to protect your hip. If any of these exercises cause increased pain or swelling in your joint, decrease the amount until you are comfortable again. Then slowly increase the exercises. Call your caregiver if you have problems or questions.   BLOOD CLOT PREVENTION . Take a 325 mg Aspirin two times a day for three weeks following surgery. Then resume one 81 mg Aspirin once a day. Dennis Bast may resume your vitamins/supplements upon discharge from the hospital. . Do not take any NSAIDs (Advil, Aleve, Ibuprofen, Meloxicam, etc.) until you have discontinued the 325 mg Aspirin.  HOME CARE INSTRUCTIONS  . Remove items at home which could result in a fall. This includes throw rugs or furniture in walking pathways.   ICE to the affected hip as frequently as 20-30 minutes an hour and then as needed for pain and swelling. Continue to use ice on the hip for pain and swelling from surgery. You may notice swelling that will progress down to the foot and ankle. This is normal after surgery. Elevate the leg when you are not up walking on it.    Continue to use the breathing machine which will help keep your temperature down.  It is common for your temperature to cycle up and down following surgery, especially at night when you are not up moving around and exerting yourself.  The breathing machine keeps your lungs expanded and your temperature down.  DIET You may resume your previous home diet once your are discharged from the hospital.  DRESSING / WOUND CARE / SHOWERING . You have an adhesive waterproof  bandage over the incision. Leave this in place until your first follow-up appointment. Once you remove this you will not need to place another bandage.  . You may begin showering 3 days following surgery, but do not submerge the incision under water.  ACTIVITY . For the first 3-5 days, it is important to rest and keep the operative leg elevated. You should, as a general rule, rest for 50 minutes and walk/stretch for 10 minutes per hour. After 5 days, you may slowly increase activity as tolerated.  Marland Kitchen Perform the exercises you were provided twice a day for about 15-20 minutes each session. Begin these 2 days following surgery. . Walk with your walker as instructed. Use the walker until you are comfortable transitioning to a cane. Walk with the cane in the opposite hand of the operative leg. You may discontinue the cane once you are comfortable and walking steadily. . Avoid periods of inactivity such as sitting longer than an hour when not asleep. This helps prevent blood clots.  . Do not drive a car for 6 weeks or until released by your surgeon.  . Do not drive while taking narcotics.  TED HOSE STOCKINGS Wear the elastic stockings on both legs for three weeks following surgery during the day. You may remove them at night while sleeping.  WEIGHT BEARING Weight bearing as tolerated with assist device (walker, cane, etc) as directed, use it as long as suggested by your surgeon or therapist, typically at least 4-6 weeks.  POSTOPERATIVE CONSTIPATION PROTOCOL Constipation - defined medically as fewer than  three stools per week and severe constipation as less than one stool per week.  One of the most common issues patients have following surgery is constipation.  Even if you have a regular bowel pattern at home, your normal regimen is likely to be disrupted due to multiple reasons following surgery.  Combination of anesthesia, postoperative narcotics, change in appetite and fluid intake all can affect  your bowels.  In order to avoid complications following surgery, here are some recommendations in order to help you during your recovery period.  . Colace (docusate) - Pick up an over-the-counter form of Colace or another stool softener and take twice a day as long as you are requiring postoperative pain medications.  Take with a full glass of water daily.  If you experience loose stools or diarrhea, hold the colace until you stool forms back up.  If your symptoms do not get better within 1 week or if they get worse, check with your doctor. . Dulcolax (bisacodyl) - Pick up over-the-counter and take as directed by the product packaging as needed to assist with the movement of your bowels.  Take with a full glass of water.  Use this product as needed if not relieved by Colace only.  . MiraLax (polyethylene glycol) - Pick up over-the-counter to have on hand.  MiraLax is a solution that will increase the amount of water in your bowels to assist with bowel movements.  Take as directed and can mix with a glass of water, juice, soda, coffee, or tea.  Take if you go more than two days without a movement.Do not use MiraLax more than once per day. Call your doctor if you are still constipated or irregular after using this medication for 7 days in a row.  If you continue to have problems with postoperative constipation, please contact the office for further assistance and recommendations.  If you experience "the worst abdominal pain ever" or develop nausea or vomiting, please contact the office immediatly for further recommendations for treatment.  ITCHING  If you experience itching with your medications, try taking only a single pain pill, or even half a pain pill at a time.  You can also use Benadryl over the counter for itching or also to help with sleep.   MEDICATIONS See your medication summary on the "After Visit Summary" that the nursing staff will review with you prior to discharge.  You may have some home  medications which will be placed on hold until you complete the course of blood thinner medication.  It is important for you to complete the blood thinner medication as prescribed by your surgeon.  Continue your approved medications as instructed at time of discharge.  PRECAUTIONS If you experience chest pain or shortness of breath - call 911 immediately for transfer to the hospital emergency department.  If you develop a fever greater that 101 F, purulent drainage from wound, increased redness or drainage from wound, foul odor from the wound/dressing, or calf pain - CONTACT YOUR SURGEON.                                                   FOLLOW-UP APPOINTMENTS Make sure you keep all of your appointments after your operation with your surgeon and caregivers. You should call the office at the above phone number and make an appointment for approximately  two weeks after the date of your surgery or on the date instructed by your surgeon outlined in the "After Visit Summary".  RANGE OF MOTION AND STRENGTHENING EXERCISES  These exercises are designed to help you keep full movement of your hip joint. Follow your caregiver's or physical therapist's instructions. Perform all exercises about fifteen times, three times per day or as directed. Exercise both hips, even if you have had only one joint replacement. These exercises can be done on a training (exercise) mat, on the floor, on a table or on a bed. Use whatever works the best and is most comfortable for you. Use music or television while you are exercising so that the exercises are a pleasant break in your day. This will make your life better with the exercises acting as a break in routine you can look forward to.  . Lying on your back, slowly slide your foot toward your buttocks, raising your knee up off the floor. Then slowly slide your foot back down until your leg is straight again.  . Lying on your back spread your legs as far apart as you can without  causing discomfort.  . Lying on your side, raise your upper leg and foot straight up from the floor as far as is comfortable. Slowly lower the leg and repeat.  . Lying on your back, tighten up the muscle in the front of your thigh (quadriceps muscles). You can do this by keeping your leg straight and trying to raise your heel off the floor. This helps strengthen the largest muscle supporting your knee.  . Lying on your back, tighten up the muscles of your buttocks both with the legs straight and with the knee bent at a comfortable angle while keeping your heel on the floor.   POST-OPERATIVE OPIOID TAPER INSTRUCTIONS: . It is important to wean off of your opioid medication as soon as possible. If you do not need pain medication after your surgery it is ok to stop day one. Marland Kitchen Opioids include: o Codeine, Hydrocodone(Norco, Vicodin), Oxycodone(Percocet, oxycontin) and hydromorphone amongst others.  . Long term and even short term use of opiods can cause: o Increased pain response o Dependence o Constipation o Depression o Respiratory depression o And more.  . Withdrawal symptoms can include o Flu like symptoms o Nausea, vomiting o And more . Techniques to manage these symptoms o Hydrate well o Eat regular healthy meals o Stay active o Use relaxation techniques(deep breathing, meditating, yoga) . Do Not substitute Alcohol to help with tapering . If you have been on opioids for less than two weeks and do not have pain than it is ok to stop all together.  . Plan to wean off of opioids o This plan should start within one week post op of your joint replacement. o Maintain the same interval or time between taking each dose and first decrease the dose.  o Cut the total daily intake of opioids by one tablet each day o Next start to increase the time between doses. o The last dose that should be eliminated is the evening dose.     IF YOU ARE TRANSFERRED TO A SKILLED REHAB FACILITY If the  patient is transferred to a skilled rehab facility following release from the hospital, a list of the current medications will be sent to the facility for the patient to continue.  When discharged from the skilled rehab facility, please have the facility set up the patient's [place] prior to  being released. Also, the skilled facility will be responsible for providing the patient with their medications at time of release from the facility to include their pain medication, the muscle relaxants, and their blood thinner medication. If the patient is still at the rehab facility at time of the two week follow up appointment, the skilled rehab facility will also need to assist the patient in arranging follow up appointment in our office and any transportation needs.  MAKE SURE YOU:  . Understand these instructions.  . Get help right away if you are not doing well or get worse.    DENTAL ANTIBIOTICS:  In most cases prophylactic antibiotics for Dental procdeures after total joint surgery are not necessary.  Exceptions are as follows:  1. History of prior total joint infection  2. Severely immunocompromised (Organ Transplant, cancer chemotherapy, Rheumatoid biologic meds such as [place])  3. Poorly controlled diabetes (A1C &gt; 8.0, blood glucose over 200)  If you have one of these conditions, contact your surgeon for an antibiotic prescription, prior to your dental procedure.    Pick up stool softner and laxative for home use following surgery while on pain medications. Do not submerge incision under water. Please use good hand washing techniques while changing dressing each day. May shower starting three days after surgery. Please use a clean towel to pat the incision dry following showers. Continue to use ice for pain and swelling after surgery. Do not use any lotions or creams on the incision until instructed by your surgeon.

## 2020-05-14 NOTE — Evaluation (Signed)
Physical Therapy Evaluation Patient Details Name: Karen [name] MRN: [id number] DOB: 09-Dec-1928 Today's Date: 05/14/2020   History of Present Illness  Patient is 87 y.o. female s/p Rt THA anterior approach on 05/14/20 with PMH significant for HTN, DM, fibromyalgia, CHF, CAD, OA, Bil TKA and Lt THA, back surgery.    Clinical Impression  Karen [name] is a 87 y.o. female POD 0 s/p Rt THA. Patient reports independence with mobility at baseline. Patient is now limited by functional impairments (see PT problem list below) and requires min assist for transfers and gait with RW. Patient was able to ambulate ~35 feet with RW and min assist. Patient instructed in exercise to facilitate  circulation to manage edema and reduce DVT. Patient will benefit from continued skilled PT interventions to address impairments and progress towards PLOF. Acute PT will follow to progress mobility and stair training in preparation for safe discharge home.     Follow Up Recommendations Follow surgeon's recommendation for DC plan and follow-up therapies;Home health PT    Equipment Recommendations  None recommended by PT    Recommendations for Other Services       Precautions / Restrictions Precautions Precautions: Fall Restrictions Weight Bearing Restrictions: No Other Position/Activity Restrictions: WBAT      Mobility  Bed Mobility Overal bed mobility: Needs Assistance Bed Mobility: Supine to Sit     Supine to sit: Min assist;HOB elevated     General bed mobility comments: cues for sequencing and assist for Rt LE to EOB and to raise trunk fully and scoot forward.    Transfers Overall transfer level: Needs assistance Equipment used: Rolling walker (2 wheeled) Transfers: Sit to/from Stand Sit to Stand: Min assist         General transfer comment: cues for safe hand placement as pt reachign for front bar on walker. assist to steady and complete rise.  Ambulation/Gait Ambulation/Gait  assistance: Min assist Gait Distance (Feet): 35 Feet Assistive device: Rolling walker (2 wheeled) Gait Pattern/deviations: Step-to pattern;Decreased stride length;Decreased weight shift to right Gait velocity: decr   General Gait Details: VC's for safe step pattern and proximity to RW, no overt LOB  Stairs            Wheelchair Mobility    Modified Rankin (Stroke Patients Only)       Balance Overall balance assessment: Needs assistance Sitting-balance support: Feet supported Sitting balance-Leahy Scale: Good     Standing balance support: During functional activity;Bilateral upper extremity supported Standing balance-Leahy Scale: Fair                               Pertinent Vitals/Pain Pain Assessment: Faces Faces Pain Scale: Hurts little more Pain Location: Rthip Pain Descriptors / Indicators: Aching;Burning;Discomfort Pain Intervention(s): Limited activity within patient's tolerance;Monitored during session;Repositioned    Home Living Family/patient expects to be discharged to:: Private residence Living Arrangements: Children Available Help at Discharge: Family Type of Home: House Home Access: Stairs to enter Entrance Stairs-Rails: Left Entrance Stairs-Number of Steps: 4 Home Layout: One level Home Equipment: Environmental consultant - 2 wheels;Wheelchair - power;Bedside commode;Tub bench Additional Comments: pt lives with adult son and otehr son flew in from [place] to stay for 2 weeks.    Prior Function Level of Independence: Independent with assistive device(s);Needs assistance   Gait / Transfers Assistance Needed: pt has been usign RW in home and wheelchair for [place] distances over the last ~5 months.  ADL's /  Homemaking Assistance Needed: pt's son does her food shopping and home making        Hand Dominance   Dominant Hand: Right    Extremity/Trunk Assessment   Upper Extremity Assessment Upper Extremity Assessment: Overall WFL for tasks  assessed    Lower Extremity Assessment Lower Extremity Assessment: Generalized weakness    Cervical / Trunk Assessment Cervical / Trunk Assessment: Normal  Communication   Communication: HOH  Cognition Arousal/Alertness: Awake/alert Behavior During Therapy: WFL for tasks assessed/performed Overall Cognitive Status: Within Functional Limits for tasks assessed                                        General Comments      Exercises Total Joint Exercises Ankle Circles/Pumps: AROM;Both;20 reps;Seated   Assessment/Plan    PT Assessment Patient needs continued PT services  PT Problem List Decreased strength;Decreased range of motion;Decreased activity tolerance;Decreased balance;Decreased mobility;Decreased knowledge of use of DME;Decreased knowledge of precautions;Pain       PT Treatment Interventions DME instruction;Gait training;Stair training;Functional mobility training;Therapeutic activities;Therapeutic exercise;Balance training;Patient/family education    PT Goals (Current goals can be found in the Care Plan section)  Acute Rehab PT Goals Patient Stated Goal: stop hurting when walking PT Goal Formulation: With patient/family Time For Goal Achievement: 05/21/20 Potential to Achieve Goals: Good    Frequency 7X/week   Barriers to discharge        Co-evaluation               AM-PAC PT "6 Clicks" Mobility  Outcome Measure Help needed turning from your back to your side while in a flat bed without using bedrails?: A Little Help needed moving from lying on your back to sitting on the side of a flat bed without using bedrails?: A Little Help needed moving to and from a bed to a chair (including a wheelchair)?: A Little Help needed standing up from a chair using your arms (e.g., wheelchair or bedside chair)?: A Little Help needed to walk in hospital room?: A Little Help needed climbing 3-5 steps with a railing? : A Little 6 Click Score: 18    End  of Session Equipment Utilized During Treatment: Gait belt Activity Tolerance: Patient tolerated treatment well Patient left: in chair;with call bell/phone within reach;with chair alarm set;with family/visitor present Nurse Communication: Mobility status PT Visit Diagnosis: Muscle weakness (generalized) (M62.81);Difficulty in walking, not elsewhere classified (R26.2)    Time: 2229-7989 PT Time Calculation (min) (ACUTE ONLY): 24 min   Charges:   PT Evaluation $PT Eval Low Complexity: 1 Low PT Treatments $Gait Training: 8-22 mins        Verner Mould, DPT Acute Rehabilitation Services Office [phone] Pager [phone]    Jacques Navy 05/14/2020, 6:43 PM

## 2020-05-14 NOTE — Plan of Care (Signed)
  Problem: Education: Goal: Knowledge of General Education information will improve Description: Including pain rating scale, medication(s)/side effects and non-pharmacologic comfort measures 05/14/2020 1744 by Butler Denmark I, RN Outcome: Progressing 05/14/2020 1551 by Butler Denmark I, RN Outcome: Progressing   Problem: Activity: Goal: Risk for activity intolerance will decrease 05/14/2020 1744 by Butler Denmark I, RN Outcome: Progressing 05/14/2020 1551 by Butler Denmark I, RN Outcome: Progressing   Problem: Nutrition: Goal: Adequate nutrition will be maintained Outcome: Progressing   Problem: Elimination: Goal: Will not experience complications related to bowel motility Outcome: Progressing   Problem: Pain Managment: Goal: General experience of comfort will improve Outcome: Progressing   Problem: Education: Goal: Knowledge of General Education information will improve Description: Including pain rating scale, medication(s)/side effects and non-pharmacologic comfort measures Outcome: Progressing   Problem: Safety: Goal: Ability to remain free from injury will improve Outcome: Progressing   Problem: Education: Goal: Knowledge of the prescribed therapeutic regimen will improve Outcome: Progressing   Problem: Activity: Goal: Ability to avoid complications of mobility impairment will improve Outcome: Progressing Goal: Ability to tolerate increased activity will improve Outcome: Progressing   Problem: Clinical Measurements: Goal: Postoperative complications will be avoided or minimized Outcome: Progressing   Problem: Pain Management: Goal: Pain level will decrease with appropriate interventions Outcome: Progressing   Problem: Skin Integrity: Goal: Will show signs of wound healing Outcome: Progressing

## 2020-05-14 NOTE — Interval H&P Note (Signed)
History and Physical Interval Note:  05/14/2020 11:22 AM  Karen [name]  has presented today for surgery, with the diagnosis of right hip osteoarthritis.  The various methods of treatment have been discussed with the patient and family. After consideration of risks, benefits and other options for treatment, the patient has consented to  Procedure(s) with comments: [place] (Right) - 152min as a surgical intervention.  The patient's history has been reviewed, patient examined, no change in status, stable for surgery.  I have reviewed the patient's chart and labs.  Questions were answered to the patient's satisfaction.     Pilar Plate Karen [name]

## 2020-05-14 NOTE — Care Plan (Signed)
Ortho Bundle Case Management Note  Patient Details  Name: Karen [name] MRN: [id number] Date of Birth: 27-May-1928                  R THA on 05/14/20. DCP: Home with son. 1 story home with 4 steps. DME: No needs. Has RW & 3in1. PT: HEP   DME Arranged:  N/A DME Agency:     HH Arranged:    [place] Agency:     Additional Comments: Please contact me with any questions of if this plan should need to change.  Marianne Sofia, RN,CCM [facility]  [phone] 05/14/2020, 1:58 PM

## 2020-05-14 NOTE — Op Note (Signed)
OPERATIVE REPORT- TOTAL HIP ARTHROPLASTY   PREOPERATIVE DIAGNOSIS: Osteoarthritis of the Right hip.   POSTOPERATIVE DIAGNOSIS: Osteoarthritis of the Right  hip.   PROCEDURE: Right total hip arthroplasty, anterior approach.   SURGEON: Gaynelle Arabian, MD   ASSISTANT: Fenton Foy, PA-C  ANESTHESIA:  General  ESTIMATED BLOOD LOSS:-350 mL    DRAINS: Hemovac x1.   COMPLICATIONS: None   CONDITION: PACU - hemodynamically stable.   BRIEF CLINICAL NOTE: Karen [name] is a 87 y.o. female who has advanced end-  stage arthritis of their Right  hip with progressively worsening pain and  dysfunction.The patient has failed nonoperative management and presents for  total hip arthroplasty.   PROCEDURE IN DETAIL: After successful administration of spinal  anesthetic, the traction boots for the [facility] bed were placed on both  feet and the patient was placed onto the [facility] bed, boots placed into the leg  holders. The Right hip was then isolated from the perineum with plastic  drapes and prepped and draped in the usual sterile fashion. ASIS and  greater trochanter were marked and a oblique incision was made, starting  at about 1 cm lateral and 2 cm distal to the ASIS and coursing towards  the anterior cortex of the femur. The skin was cut with a 10 blade  through subcutaneous tissue to the level of the fascia overlying the  tensor fascia lata muscle. The fascia was then incised in line with the  incision at the junction of the anterior third and posterior 2/3rd. The  muscle was teased off the fascia and then the interval between the TFL  and the rectus was developed. The Hohmann retractor was then placed at  the top of the femoral neck over the capsule. The vessels overlying the  capsule were cauterized and the fat on top of the capsule was removed.  A Hohmann retractor was then placed anterior underneath the rectus  femoris to give exposure to the entire anterior capsule. A T-shaped   capsulotomy was performed. The edges were tagged and the femoral head  was identified.       Osteophytes are removed off the superior acetabulum.  The femoral neck was then cut in situ with an oscillating saw. Traction  was then applied to the left lower extremity utilizing the [facility]  traction. The femoral head was then removed. Retractors were placed  around the acetabulum and then circumferential removal of the labrum was  performed. Osteophytes were also removed. Reaming starts at 45 mm to  medialize and  Increased in 2 mm increments to 49 mm. We reamed in  approximately 40 degrees of abduction, 20 degrees anteversion. A 50 mm  pinnacle acetabular shell was then impacted in anatomic position under  fluoroscopic guidance with excellent purchase. We did not need to place  any additional dome screws. A 32 mm neutral + 4 marathon liner was then  placed into the acetabular shell.       The femoral lift was then placed along the lateral aspect of the femur  just distal to the vastus ridge. The leg was  externally rotated and capsule  was stripped off the inferior aspect of the femoral neck down to the  level of the lesser trochanter, this was done with electrocautery. The femur was lifted after this was performed. The  leg was then placed in an extended and adducted position essentially delivering the femur. We also removed the capsule superiorly and the piriformis from the piriformis  fossa to gain excellent exposure of the  proximal femur. Rongeur was used to remove some cancellous bone to get  into the lateral portion of the proximal femur for placement of the  initial starter reamer. The starter broaches was placed  the starter broach  and was shown to go down the center of the canal. Broaching  with the Actis system was then performed starting at size 0  coursing  Up to size 2. A size 2 had excellent torsional and rotational  and axial stability. The trial high offset neck was then placed   with a 32 + 1 trial head. The hip was then reduced. We confirmed that  the stem was in the canal both on AP and lateral x-rays. It also has excellent sizing. The hip was reduced with outstanding stability through full extension and full external rotation.. AP pelvis was taken and the leg lengths were measured and found to be equal. Hip was then dislocated again and the femoral head and neck removed. The  femoral broach was removed. Size 2 Actis stem with a high offset  neck was then impacted into the femur following native anteversion. Has  excellent purchase in the canal. Excellent torsional and rotational and  axial stability. It is confirmed to be in the canal on AP and lateral  fluoroscopic views. The 32 +1 high head was placed and the hip  reduced with outstanding stability. Again AP pelvis was taken and it  confirmed that the leg lengths were equal. The wound was then copiously  irrigated with saline solution and the capsule reattached and repaired  with Ethibond suture. 30 ml of .25% Bupivicaine was  injected into the capsule and into the edge of the tensor fascia lata as well as subcutaneous tissue. The fascia overlying the tensor fascia lata was then closed with a running #1 V-Loc. Subcu was closed with interrupted 2-0 Vicryl and subcuticular running 4-0 Monocryl. Incision was cleaned  and dried. Steri-Strips and a bulky sterile dressing applied. Hemovac  drain was hooked to suction and then the patient was awakened and transported to  recovery in stable condition.        Please note that a surgical assistant was a medical necessity for this procedure to perform it in a safe and expeditious manner. Assistant was necessary to provide appropriate retraction of vital neurovascular structures and to prevent femoral fracture and allow for anatomic placement of the prosthesis.  Gaynelle Arabian, M.D.

## 2020-05-14 NOTE — Anesthesia Procedure Notes (Signed)
Procedure Name: Intubation Date/Time: 05/14/2020 12:47 PM Performed by: Licia Harl D, CRNA Pre-anesthesia Checklist: Patient identified, Emergency Drugs available, Suction available and Patient being monitored Patient Re-evaluated:Patient Re-evaluated prior to induction Oxygen Delivery Method: Circle system utilized Preoxygenation: Pre-oxygenation with 100% oxygen Induction Type: IV induction Ventilation: Mask ventilation without difficulty Laryngoscope Size: Mac and 3 Grade View: Grade I Tube type: Oral Tube size: 7.0 mm Number of attempts: 1 Airway Equipment and Method: Stylet Placement Confirmation: ETT inserted through vocal cords under direct vision,  positive ETCO2 and breath sounds checked- equal and bilateral Secured at: 21 cm Tube secured with: Tape Dental Injury: Teeth and Oropharynx as per pre-operative assessment

## 2020-05-14 NOTE — Anesthesia Preprocedure Evaluation (Addendum)
Anesthesia Evaluation  Patient identified by MRN, date of birth, ID band Patient awake    Reviewed: Allergy & Precautions, NPO status , Patient's Chart, lab work & pertinent test results, reviewed documented beta blocker date and time   History of Anesthesia Complications Negative for: history of anesthetic complications  Airway Mallampati: II  TM Distance: >3 FB Neck ROM: Full    Dental  (+) Dental Advisory Given   Pulmonary neg pulmonary ROS,    Pulmonary exam normal        Cardiovascular hypertension, Pt. on medications and Pt. on home beta blockers + CAD and + Cardiac Stents  + Valvular Problems/Murmurs (MS)  Rhythm:Regular Rate:Normal + Diastolic murmurs- Systolic murmurs  '22 TTE - EF 60 to 65%. Severe asymmetric left ventricular hypertrophy of the basal-septal segment. Trivial MR, moderate MS. Mild AI. Mild to moderate aortic valve sclerosis/calcification is present, without any evidence of aortic stenosis. Aneurysm of the ascending aorta, measuring 45 mm.    Neuro/Psych negative neurological ROS  negative psych ROS   GI/Hepatic negative GI ROS, Neg liver ROS,   Endo/Other  diabetes, Type 2, Oral Hypoglycemic Agents  Renal/GU Renal InsufficiencyRenal disease     Musculoskeletal  (+) Arthritis , Fibromyalgia -  Abdominal   Peds  Hematology negative hematology ROS (+)  Plt 158k    Anesthesia Other Findings Covid test negative   Reproductive/Obstetrics                           Anesthesia Physical Anesthesia Plan  ASA: III  Anesthesia Plan: General   Post-op Pain Management:    Induction: Intravenous  PONV Risk Score and Plan: 3 and Treatment may vary due to age or medical condition, Ondansetron and Propofol infusion  Airway Management Planned: Oral ETT  Additional Equipment: None  Intra-op Plan:   Post-operative Plan: Extubation in OR  Informed Consent: I have  reviewed the patients History and Physical, chart, labs and discussed the procedure including the risks, benefits and alternatives for the proposed anesthesia with the patient or authorized representative who has indicated his/her understanding and acceptance.     Dental advisory given  Plan Discussed with: CRNA and Anesthesiologist  Anesthesia Plan Comments:       Anesthesia Quick Evaluation

## 2020-05-14 NOTE — Transfer of Care (Signed)
Immediate Anesthesia Transfer of Care Note  Patient: Karen [name]  Procedure(s) Performed: TOTAL HIP ARTHROPLASTY ANTERIOR APPROACH (Right Hip)  Patient Location: PACU  Anesthesia Type:General  Level of Consciousness: awake, alert  and oriented  Airway & Oxygen Therapy: Patient Spontanous Breathing and Patient connected to face mask oxygen  Post-op Assessment: Report given to RN and Post -op Vital signs reviewed and stable  Post vital signs: Reviewed and stable  Last Vitals:  Vitals Value Taken Time  BP    Temp    Pulse    Resp 15 05/14/20 1436  SpO2    Vitals shown include unvalidated device data.  Last Pain:  Vitals:   05/14/20 1100  TempSrc:   PainSc: 6          Complications: No complications documented.

## 2020-05-15 ENCOUNTER — Encounter (HOSPITAL_COMMUNITY): Payer: Self-pay | Admitting: Orthopedic Surgery

## 2020-05-15 DIAGNOSIS — E119 Type 2 diabetes mellitus without complications: Secondary | ICD-10-CM | POA: Diagnosis not present

## 2020-05-15 DIAGNOSIS — I110 Hypertensive heart disease with heart failure: Secondary | ICD-10-CM | POA: Diagnosis not present

## 2020-05-15 DIAGNOSIS — Z79899 Other long term (current) drug therapy: Secondary | ICD-10-CM | POA: Diagnosis not present

## 2020-05-15 DIAGNOSIS — M1611 Unilateral primary osteoarthritis, right hip: Secondary | ICD-10-CM | POA: Diagnosis not present

## 2020-05-15 DIAGNOSIS — I2510 Atherosclerotic heart disease of native coronary artery without angina pectoris: Secondary | ICD-10-CM | POA: Diagnosis not present

## 2020-05-15 DIAGNOSIS — I5042 Chronic combined systolic (congestive) and diastolic (congestive) heart failure: Secondary | ICD-10-CM | POA: Diagnosis not present

## 2020-05-15 LAB — CBC
HCT: 29.3 % — ABNORMAL LOW (ref 36.0–46.0)
Hemoglobin: 9.7 g/dL — ABNORMAL LOW (ref 12.0–15.0)
MCH: 34.8 pg — ABNORMAL HIGH (ref 26.0–34.0)
MCHC: 33.1 g/dL (ref 30.0–36.0)
MCV: 105 fL — ABNORMAL HIGH (ref 80.0–100.0)
Platelets: 122 10*3/uL — ABNORMAL LOW (ref 150–400)
RBC: 2.79 MIL/uL — ABNORMAL LOW (ref 3.87–5.11)
RDW: 12.5 % (ref 11.5–15.5)
WBC: 8 10*3/uL (ref 4.0–10.5)
nRBC: 0 % (ref 0.0–0.2)

## 2020-05-15 LAB — BASIC METABOLIC PANEL
Anion gap: 12 (ref 5–15)
BUN: 32 mg/dL — ABNORMAL HIGH (ref 8–23)
CO2: 22 mmol/L (ref 22–32)
Calcium: 8.5 mg/dL — ABNORMAL LOW (ref 8.9–10.3)
Chloride: 101 mmol/L (ref 98–111)
Creatinine, Ser: 1.33 mg/dL — ABNORMAL HIGH (ref 0.44–1.00)
GFR, Estimated: 38 mL/min — ABNORMAL LOW (ref >60)
Glucose, Bld: 314 mg/dL — ABNORMAL HIGH (ref 70–99)
Potassium: 3.6 mmol/L (ref 3.5–5.1)
Sodium: 135 mmol/L (ref 135–145)

## 2020-05-15 LAB — GLUCOSE, CAPILLARY
Glucose-Capillary: 218 mg/dL — ABNORMAL HIGH (ref 70–99)
Glucose-Capillary: 191 mg/dL — ABNORMAL HIGH (ref 70–99)

## 2020-05-15 MED ORDER — ASPIRIN 325 MG PO TBEC: 325.0000 mg | DELAYED_RELEASE_TABLET | Freq: Two times a day (BID) | ORAL | 0 refills | Status: DC

## 2020-05-15 MED ORDER — METHOCARBAMOL 500 MG PO TABS: 500.0000 mg | ORAL_TABLET | Freq: Four times a day (QID) | ORAL | 0 refills | Status: DC | PRN

## 2020-05-15 MED ORDER — HYDROCODONE-ACETAMINOPHEN 5-325 MG PO TABS: 1.0000 | ORAL_TABLET | Freq: Four times a day (QID) | ORAL | 0 refills | Status: DC | PRN

## 2020-05-15 NOTE — Progress Notes (Signed)
Physical Therapy Treatment Patient Details Name: Karen [name] MRN: [id number] DOB: 1928-04-29 Today's Date: 05/15/2020    History of Present Illness Patient is 87 y.o. female s/p Rt THA anterior approach on 05/14/20 with PMH significant for HTN, DM, fibromyalgia, CHF, CAD, OA, Bil TKA and Lt THA, back surgery.    PT Comments    Patient continues to improve mobility. She required supervision only for transfers from recliner and toilet demonstrating excellent carryover for safe sequencing. She was able to ambulate ~110' with guarding/assist provided by sons and no overt LOB noted. Patient also completed stair training with no LOB noted and safe guarding provided by sons via cues form therapist. Completed review of HEP at [place] and all questions addressed. Will continue to progress pt during acute stay. She is at safe level for discharge home with assist from sons.    Follow Up Recommendations  Follow surgeon's recommendation for DC plan and follow-up therapies;Home health PT     Equipment Recommendations  None recommended by PT    Recommendations for Other Services       Precautions / Restrictions Precautions Precautions: Fall Restrictions Weight Bearing Restrictions: No Other Position/Activity Restrictions: WBAT    Mobility  Bed Mobility               General bed mobility comments: pt in recliner OOB    Transfers Overall transfer level: Needs assistance Equipment used: Rolling walker (2 wheeled) Transfers: Sit to/from Stand Sit to Stand: Supervision         General transfer comment: good carryover for safety with rise from recliner and toilet, supervision no assist needed.  Ambulation/Gait Ambulation/Gait assistance: Min guard Gait Distance (Feet): 110 Feet Assistive device: Rolling walker (2 wheeled) Gait Pattern/deviations: Step-to pattern;Decreased stride length;Decreased weight shift to right Gait velocity: decr   General Gait Details: good carryover for  safety with RW, pt maintained safe position, step through patter, and kept walker on ground. pt's sons provided safe guarding during gait with cues from therapist.   Stairs Stairs: Yes Stairs assistance: Supervision;Min guard Stair Management: One rail Left;Step to pattern;Forwards (HHA) Number of Stairs: 4 General stair comments: cues for safe sequencing "up with good, down with bad" and for safe guarding position for pt's sons to provide. pt's sons assisted with RW management as well. She was able to to negotiate single step up/curb with RW and cues.   Wheelchair Mobility    Modified Rankin (Stroke Patients Only)       Balance Overall balance assessment: Needs assistance Sitting-balance support: Feet supported Sitting balance-Leahy Scale: Good     Standing balance support: During functional activity;Bilateral upper extremity supported Standing balance-Leahy Scale: Fair Standing balance comment: pt able to was hands at sink with no support on counter                            Cognition Arousal/Alertness: Awake/alert Behavior During Therapy: WFL for tasks assessed/performed Overall Cognitive Status: Within Functional Limits for tasks assessed                                        Exercises Total Joint Exercises Ankle Circles/Pumps: AROM;Both;20 reps;Seated Quad Sets: AROM;Right;10 reps;Seated Heel Slides: AROM;Right;10 reps;Seated Hip ABduction/ADduction: AROM;Right;Seated;5 reps    General Comments        Pertinent Vitals/Pain Pain Assessment: Faces Faces Pain Scale: Hurts  little more Pain Location: Rthip Pain Descriptors / Indicators: Aching;Burning;Discomfort Pain Intervention(s): Limited activity within patient's tolerance;Monitored during session;Repositioned;Ice applied    Home Living                      Prior Function            PT Goals (current goals can now be found in the care plan section) Acute Rehab PT  Goals Patient Stated Goal: get working on therapy PT Goal Formulation: With patient/family Time For Goal Achievement: 05/21/20 Potential to Achieve Goals: Good Progress towards PT goals: Progressing toward goals    Frequency    7X/week      PT Plan Current plan remains appropriate    Co-evaluation              AM-PAC PT "6 Clicks" Mobility   Outcome Measure  Help needed turning from your back to your side while in a flat bed without using bedrails?: A Little Help needed moving from lying on your back to sitting on the side of a flat bed without using bedrails?: A Little Help needed moving to and from a bed to a chair (including a wheelchair)?: A Little Help needed standing up from a chair using your arms (e.g., wheelchair or bedside chair)?: A Little Help needed to walk in hospital room?: A Little Help needed climbing 3-5 steps with a railing? : A Little 6 Click Score: 18    End of Session Equipment Utilized During Treatment: Gait belt Activity Tolerance: Patient tolerated treatment well Patient left: in chair;with call bell/phone within reach;with chair alarm set;with family/visitor present Nurse Communication: Mobility status PT Visit Diagnosis: Muscle weakness (generalized) (M62.81);Difficulty in walking, not elsewhere classified (R26.2)     Time: 1341-1406 PT Time Calculation (min) (ACUTE ONLY): 25 min  Charges:  $Gait Training: 8-22 mins $Therapeutic Exercise: 8-22 mins                     Karen [name], DPT Acute Rehabilitation Services Office [phone] Pager [phone]     Karen [name] 05/15/2020, 3:01 PM

## 2020-05-15 NOTE — Progress Notes (Signed)
Physical Therapy Treatment Patient Details Name: Karen [name] MRN: [id number] DOB: 21-Apr-1928 Today's Date: 05/15/2020    History of Present Illness Patient is 87 y.o. female s/p Rt THA anterior approach on 05/14/20 with PMH significant for HTN, DM, fibromyalgia, CHF, CAD, OA, Bil TKA and Lt THA, back surgery.    PT Comments    Patient progressing well and ambulated ~110' with RW and min guard with occasional cues for safe walker management. Patient ambulated short distance to bathroom with cues for safety and NT present to assist as well. Patient instructed on supine/seated HEP and educated on frequency as well as benefits of walking ~1x/hour at home. Will follow up to finalize HEP and complete gait/stair training with pt's family present to educate on assist level needed prior to discharge home.   Follow Up Recommendations  Follow surgeon's recommendation for DC plan and follow-up therapies;Home health PT     Equipment Recommendations  None recommended by PT    Recommendations for Other Services       Precautions / Restrictions Precautions Precautions: Fall Restrictions Weight Bearing Restrictions: No Other Position/Activity Restrictions: WBAT    Mobility  Bed Mobility               General bed mobility comments: sitting EOB with NT to go to bathroom    Transfers Overall transfer level: Needs assistance Equipment used: Rolling walker (2 wheeled) Transfers: Sit to/from Stand Sit to Stand: Min guard;Supervision         General transfer comment: no cues needed for safe power up to rise from EOB and toilet to RW. guarding/supervision for safety. pt using grab bar by toilet in bathroom.  Ambulation/Gait Ambulation/Gait assistance: Min guard Gait Distance (Feet): 110 Feet Assistive device: Rolling walker (2 wheeled) Gait Pattern/deviations: Step-to pattern;Decreased stride length;Decreased weight shift to right Gait velocity: decr   General Gait Details: pt  with good carryover from prior session for step pattern. intermittent cues needed for safety to roll walker rather than pick it up.   Stairs             Wheelchair Mobility    Modified Rankin (Stroke Patients Only)       Balance Overall balance assessment: Needs assistance Sitting-balance support: Feet supported Sitting balance-Leahy Scale: Good     Standing balance support: During functional activity;Bilateral upper extremity supported Standing balance-Leahy Scale: Fair Standing balance comment: pt abel to don robe in standing with no external support                            Cognition Arousal/Alertness: Awake/alert Behavior During Therapy: WFL for tasks assessed/performed Overall Cognitive Status: Within Functional Limits for tasks assessed                                        Exercises Total Joint Exercises Ankle Circles/Pumps: AROM;Both;20 reps;Seated Quad Sets: AROM;Right;10 reps;Seated Heel Slides: AROM;Right;10 reps;Seated Hip ABduction/ADduction: AROM;Right;Seated;5 reps    General Comments        Pertinent Vitals/Pain Pain Assessment: Faces Faces Pain Scale: Hurts little more Pain Location: Rthip Pain Descriptors / Indicators: Aching;Burning;Discomfort Pain Intervention(s): Limited activity within patient's tolerance;Monitored during session;Repositioned;Ice applied    Home Living                      Prior Function  PT Goals (current goals can now be found in the care plan section) Acute Rehab PT Goals Patient Stated Goal: get working on therapy PT Goal Formulation: With patient/family Time For Goal Achievement: 05/21/20 Potential to Achieve Goals: Good Progress towards PT goals: Progressing toward goals    Frequency    7X/week      PT Plan Current plan remains appropriate    Co-evaluation              AM-PAC PT "6 Clicks" Mobility   Outcome Measure  Help needed turning  from your back to your side while in a flat bed without using bedrails?: A Little Help needed moving from lying on your back to sitting on the side of a flat bed without using bedrails?: A Little Help needed moving to and from a bed to a chair (including a wheelchair)?: A Little Help needed standing up from a chair using your arms (e.g., wheelchair or bedside chair)?: A Little Help needed to walk in hospital room?: A Little Help needed climbing 3-5 steps with a railing? : A Little 6 Click Score: 18    End of Session Equipment Utilized During Treatment: Gait belt Activity Tolerance: Patient tolerated treatment well Patient left: in chair;with call bell/phone within reach;with chair alarm set;with family/visitor present Nurse Communication: Mobility status PT Visit Diagnosis: Muscle weakness (generalized) (M62.81);Difficulty in walking, not elsewhere classified (R26.2)     Time: 9480-1655 PT Time Calculation (min) (ACUTE ONLY): 26 min  Charges:  $Gait Training: 8-22 mins $Therapeutic Exercise: 8-22 mins                     Verner Mould, DPT Acute Rehabilitation Services Office [phone] Pager [phone]     Jacques Navy 05/15/2020, 12:39 PM

## 2020-05-15 NOTE — Progress Notes (Signed)
   Subjective: 1 Day Post-Op Procedure(s) (LRB): TOTAL HIP ARTHROPLASTY ANTERIOR APPROACH (Right) Patient reports pain as mild.   Patient seen in rounds by Dr. Wynelle Link. Patient is well, and has had no acute complaints or problems. No acute overnight events. Denies SOB, chest pain, or calf pain. Foley to be pulled this am. Ambulated 35 feet with PT yesterday.  We will continue therapy today.   Objective: Vital signs in last 24 hours: Temp:  [97.4 F (36.3 C)-98.3 F (36.8 C)] 97.4 F (36.3 C) (04/07 0536) Pulse Rate:  [76-122] 83 (04/07 0536) Resp:  [13-30] 16 (04/07 0536) BP: (111-193)/(58-91) 123/58 (04/07 0536) SpO2:  [98 %-100 %] 98 % (04/07 0536) Weight:  [62.6 kg] 62.6 kg (04/06 1632)  Intake/Output from previous day:  Intake/Output Summary (Last 24 hours) at 05/15/2020 0725 Last data filed at 05/15/2020 0600 Gross per 24 hour  Intake 3012.5 ml  Output 2550 ml  Net 462.5 ml     Intake/Output this shift: No intake/output data recorded.  Labs: Recent Labs    05/15/20 0314  HGB 9.7*   Recent Labs    05/15/20 0314  WBC 8.0  RBC 2.79*  HCT 29.3*  PLT 122*   Recent Labs    05/15/20 0314  NA 135  K 3.6  CL 101  CO2 22  BUN 32*  CREATININE 1.33*  GLUCOSE 314*  CALCIUM 8.5*   No results for input(s): LABPT, INR in the last 72 hours.  Exam: General - Patient is Alert and Oriented Extremity - Neurologically intact Neurovascular intact Intact pulses distally Dorsiflexion/Plantar flexion intact Dressing - dressing C/D/I Motor Function - intact, moving foot and toes well on exam.   Past Medical History:  Diagnosis Date  . Arthritis   . CHF (congestive heart failure) ([place])   . Constipation   . Coronary artery disease 04/2013   Three-vessel disease, initial medical therapy then stenting of the LAD and CFX 10/2013  . Diabetes mellitus    TYPE 2  . Fibromyalgia   . H/O: gout   . Heart murmur   . Hx: UTI (urinary tract infection)   . Hypertension      Assessment/Plan: 1 Day Post-Op Procedure(s) (LRB): TOTAL HIP ARTHROPLASTY ANTERIOR APPROACH (Right) Principal Problem:   OA (osteoarthritis) of hip Active Problems:   Primary osteoarthritis of right hip  Estimated body mass index is 23.69 kg/m as calculated from the following:   Height as of this encounter: 5\' 4"  (1.626 m).   Weight as of this encounter: 62.6 kg. Advance diet Up with therapy  DVT Prophylaxis - Aspirin and TED hose Weight bearing as tolerated. Continue therapy.  Plan for two sessions of PT today, and if meeting goals, will plan for discharge this afternoon.   Plan is to go Home after hospital stay.   Patient to follow up in two weeks with Dr. Wynelle Link in clinic.   The PDMP database was reviewed today prior to any opioid medications being prescribed to this patient.  Fenton Foy, [place], PA-C Orthopedic Surgery [phone] 05/15/2020, 7:25 AM

## 2020-05-15 NOTE — TOC Transition Note (Signed)
Transition of Care [facility]) - CM/SW Discharge Note  Patient Details  Name: Karen [name] MRN: [id number] Date of Birth: 1928/05/18  Transition of [facility] CM/SW Contact:  Sherie Don, LCSW Phone Number: 05/15/2020, 10:29 AM  Clinical Narrative: Patient expected to discharge home today. CSW met with patient to review discharge plan. Patient will complete a home exercise program (HEP) and has a rolling walker, 3N1, and transfer bench at home, so patient has no DME needs at this time. TOC signing off.  Final next level of care: Home/Self Care Barriers to Discharge: No Barriers Identified  Patient Goals and CMS Choice Patient states their goals for this hospitalization and ongoing recovery are:: Discharge home with HEP CMS Medicare.gov Compare Post Acute Care list provided to:: Patient Choice offered to / list presented to : Patient  Discharge Plan and Services         DME Arranged: N/A DME Agency: NA  Readmission Risk Interventions No flowsheet data found.

## 2020-05-19 NOTE — Discharge Summary (Signed)
Physician Discharge Summary   Patient ID: Karen [name] MRN: [id number] DOB/AGE: [birth year] 87 y.o.  Admit date: 05/14/2020 Discharge date: 05/15/2020  Primary Diagnosis: Osteoarthritis of Right Hip  Admission Diagnoses:  Past Medical History:  Diagnosis Date  . Arthritis   . CHF (congestive heart failure) ([place])   . Constipation   . Coronary artery disease 04/2013   Three-vessel disease, initial medical therapy then stenting of the LAD and CFX 10/2013  . Diabetes mellitus    TYPE 2  . Fibromyalgia   . H/O: gout   . Heart murmur   . Hx: UTI (urinary tract infection)   . Hypertension    Discharge Diagnoses:   Principal Problem:   OA (osteoarthritis) of hip Active Problems:   Primary osteoarthritis of right hip  Estimated body mass index is 23.69 kg/m as calculated from the following:   Height as of this encounter: 5\' 4"  (1.626 m).   Weight as of this encounter: 62.6 kg.  Procedure:  Procedure(s) (LRB): TOTAL HIP ARTHROPLASTY ANTERIOR APPROACH (Right)   Consults: None  HPI: Karen [name] is a 87 y.o. female who has advanced end-  stage arthritis of their Right  hip with progressively worsening pain and  dysfunction.The patient has failed nonoperative management and presents for  total hip arthroplasty.   Laboratory Data: Admission on 05/14/2020, Discharged on 05/15/2020  Component Date Value Ref Range Status  . Hgb A1c MFr Bld 05/14/2020 6.5* 4.8 - 5.6 % Final   Comment: (NOTE) Pre diabetes:          5.7%-6.4%  Diabetes:              >6.4%  Glycemic control for   <7.0% adults with diabetes   . Mean Plasma Glucose 05/14/2020 139.85  mg/dL Final   Performed at [facility] [address] [place], [place] [address]  . Glucose-Capillary 05/14/2020 130* 70 - 99 mg/dL Final   Glucose reference range applies only to samples taken after fasting for at least 8 hours.  . Glucose-Capillary 05/14/2020 142* 70 - 99 mg/dL Final   Glucose reference range  applies only to samples taken after fasting for at least 8 hours.  . Glucose-Capillary 05/14/2020 190* 70 - 99 mg/dL Final   Glucose reference range applies only to samples taken after fasting for at least 8 hours.  . WBC 05/15/2020 8.0  4.0 - 10.5 K/uL Final  . RBC 05/15/2020 2.79* 3.87 - 5.11 MIL/uL Final  . Hemoglobin 05/15/2020 9.7* 12.0 - 15.0 g/dL Final  . HCT 05/15/2020 29.3* 36.0 - 46.0 % Final  . MCV 05/15/2020 105.0* 80.0 - 100.0 fL Final  . MCH 05/15/2020 34.8* 26.0 - 34.0 pg Final  . MCHC 05/15/2020 33.1  30.0 - 36.0 g/dL Final  . RDW 05/15/2020 12.5  11.5 - 15.5 % Final  . Platelets 05/15/2020 122* 150 - 400 K/uL Final  . nRBC 05/15/2020 0.0  0.0 - 0.2 % Final   Performed at [facility], [place] [address], [place], [place] [address]  . Sodium 05/15/2020 135  135 - 145 mmol/L Final  . Potassium 05/15/2020 3.6  3.5 - 5.1 mmol/L Final  . Chloride 05/15/2020 101  98 - 111 mmol/L Final  . CO2 05/15/2020 22  22 - 32 mmol/L Final  . Glucose, Bld 05/15/2020 314* 70 - 99 mg/dL Final   Glucose reference range applies only to samples taken after fasting for at least 8 hours.  . BUN 05/15/2020 32*  8 - 23 mg/dL Final  . Creatinine, Ser 05/15/2020 1.33* 0.44 - 1.00 mg/dL Final  . Calcium 05/15/2020 8.5* 8.9 - 10.3 mg/dL Final  . GFR, Estimated 05/15/2020 38* >60 mL/min Final   Comment: (NOTE) Calculated using the CKD-EPI Creatinine Equation (2021)   . Anion gap 05/15/2020 12  5 - 15 Final   Performed at [facility], [place] [address], [place], [place] [address]  . Glucose-Capillary 05/14/2020 293* 70 - 99 mg/dL Final   Glucose reference range applies only to samples taken after fasting for at least 8 hours.  . Glucose-Capillary 05/15/2020 218* 70 - 99 mg/dL Final   Glucose reference range applies only to samples taken after fasting for at least 8 hours.  . Glucose-Capillary 05/15/2020 191* 70 - 99 mg/dL Final   Glucose reference range applies only to  samples taken after fasting for at least 8 hours.  Hospital Outpatient Visit on 05/12/2020  Component Date Value Ref Range Status  . SARS Coronavirus 2 05/12/2020 NEGATIVE  NEGATIVE Final   Comment: (NOTE) SARS-CoV-2 target nucleic acids are NOT DETECTED.  The SARS-CoV-2 RNA is generally detectable in upper and lower respiratory specimens during the acute phase of infection. Negative results do not preclude SARS-CoV-2 infection, do not rule out co-infections with other pathogens, and should not be used as the sole basis for treatment or other patient management decisions. Negative results must be combined with clinical observations, patient history, and epidemiological information. The expected result is Negative.  Fact Sheet for Patients: SugarRoll.be  Fact Sheet for Healthcare Providers: https://www.woods-mathews.com/  This test is not yet approved or cleared by the [place] FDA and  has been authorized for detection and/or diagnosis of SARS-CoV-2 by FDA under an Emergency Use Authorization (EUA). This EUA will remain  in effect (meaning this test can be used) for the duration of the COVID-19 declaration under Se                          ction 564(b)(1) of the Act, 21 U.S.C. section 360bbb-3(b)(1), unless the authorization is terminated or revoked sooner.  Performed at [facility], [place] [address] [place], [place] [address]   Hospital Outpatient Visit on 05/05/2020  Component Date Value Ref Range Status  . MRSA, PCR 05/05/2020 NEGATIVE  NEGATIVE Final  . Staphylococcus aureus 05/05/2020 NEGATIVE  NEGATIVE Final   Comment: (NOTE) The Xpert SA Assay (FDA approved for NASAL specimens in patients 13 years of age and older), is one component of a comprehensive surveillance program. It is not intended to diagnose infection nor to guide or monitor treatment. Performed at [facility], [place] [address] [place], [place] [address]   . WBC 05/05/2020 6.3  4.0 - 10.5 K/uL Final  . RBC 05/05/2020 3.63* 3.87 - 5.11 MIL/uL Final  . Hemoglobin 05/05/2020 12.7  12.0 - 15.0 g/dL Final  . HCT 05/05/2020 37.9  36.0 - 46.0 % Final  . MCV 05/05/2020 104.4* 80.0 - 100.0 fL Final  . MCH 05/05/2020 35.0* 26.0 - 34.0 pg Final  . MCHC 05/05/2020 33.5  30.0 - 36.0 g/dL Final  . RDW 05/05/2020 12.6  11.5 - 15.5 % Final  . Platelets 05/05/2020 158  150 - 400 K/uL Final  . nRBC 05/05/2020 0.0  0.0 - 0.2 % Final   Performed at [facility], [place] [address] [place], [place] [address]  . Sodium 05/05/2020 137  135 -  145 mmol/L Final  . Potassium 05/05/2020 4.4  3.5 - 5.1 mmol/L Final  . Chloride 05/05/2020 104  98 - 111 mmol/L Final  . CO2 05/05/2020 24  22 - 32 mmol/L Final  . Glucose, Bld 05/05/2020 145* 70 - 99 mg/dL Final   Glucose reference range applies only to samples taken after fasting for at least 8 hours.  . BUN 05/05/2020 48* 8 - 23 mg/dL Final  . Creatinine, Ser 05/05/2020 1.48* 0.44 - 1.00 mg/dL Final  . Calcium 05/05/2020 9.7  8.9 - 10.3 mg/dL Final  . Total Protein 05/05/2020 7.4  6.5 - 8.1 g/dL Final  . Albumin 05/05/2020 4.2  3.5 - 5.0 g/dL Final  . AST 05/05/2020 23  15 - 41 U/L Final  . ALT 05/05/2020 17  0 - 44 U/L Final  . Alkaline Phosphatase 05/05/2020 55  38 - 126 U/L Final  . Total Bilirubin 05/05/2020 0.7  0.3 - 1.2 mg/dL Final  . GFR, Estimated 05/05/2020 34* >60 mL/min Final   Comment: (NOTE) Calculated using the CKD-EPI Creatinine Equation (2021)   . Anion gap 05/05/2020 9  5 - 15 Final   Performed at [facility], [place] [address], [place], [place] [address]  . Prothrombin Time 05/05/2020 13.4  11.4 - 15.2 seconds Final  . INR 05/05/2020 1.1  0.8 - 1.2 Final   Comment: (NOTE) INR goal varies based on device and disease states. Performed at [facility], [place] [address] [place], [place] [address]   . aPTT  05/05/2020 30  24 - 36 seconds Final   Performed at [facility], [place] [address], [place], [place] [address]  . ABO/RH(D) 05/05/2020 A POS   Final  . Antibody Screen 05/05/2020 NEG   Final  . Sample Expiration 05/05/2020 05/17/2020,2359   Final  . Extend sample reason 05/05/2020    Final                   Value:NO TRANSFUSIONS OR PREGNANCY IN THE PAST 3 MONTHS Performed at [facility] [address] [place], [place] [address]   . Glucose-Capillary 05/05/2020 122* 70 - 99 mg/dL Final   Glucose reference range applies only to samples taken after fasting for at least 8 hours.  . Hgb A1c MFr Bld 05/05/2020 6.8* 4.8 - 5.6 % Final   Comment: (NOTE) Pre diabetes:          5.7%-6.4%  Diabetes:              >6.4%  Glycemic control for   <7.0% adults with diabetes   . Mean Plasma Glucose 05/05/2020 148.46  mg/dL Final   Performed at [facility] [address] [place], [place] [address]  Appointment on 04/11/2020  Component Date Value Ref Range Status  . Area-P 1/2 04/11/2020 2.91  cm2 Final  . S' Lateral 04/11/2020 2.60  cm Final  . P 1/2 time 04/11/2020 373  msec Final     X-Rays:DG Pelvis Portable  Result Date: 05/14/2020 CLINICAL DATA:  87 year old female with right hip replacement. EXAM: PORTABLE PELVIS 1-2 VIEWS COMPARISON:  None. FINDINGS: Bilateral total hip arthroplasties. The arthroplasty components appear intact and in anatomic alignment. There is no acute fracture or dislocation the bones are osteopenic. Partially visualized lower lumbar fusion hardware. Postsurgical changes in the soft tissues of the right hip. Vascular calcifications noted. IMPRESSION: No acute fracture or dislocation. Bilateral total hip arthroplasties. Electronically Signed   By:  Anner Crete M.D.   On: 05/14/2020 17:54   DG C-Arm 1-60 Min-No Report  Result Date: 05/14/2020 Fluoroscopy was utilized by the requesting physician.  No radiographic interpretation.    DG HIP OPERATIVE UNILAT WITH PELVIS RIGHT  Result Date: 05/14/2020 CLINICAL DATA:  Total hip replacement on the right EXAM: OPERATIVE RIGHT HIP  1 VIEW TECHNIQUE: Fluoroscopic spot image(s) were submitted for interpretation post-operatively. COMPARISON:  None. FLUOROSCOPY TIME:  0 minutes 15 seconds; 1.58 mGy; 7 acquired images FINDINGS: Initial image shows severe osteoarthritic change in the right hip joint. Total hip replacement noted on the left. Images show placement of a total hip prosthesis on the right. Final images shows total hip prosthesis on the right with prosthetic components well-seated on frontal view. No fracture or dislocation. IMPRESSION: Total hip replacement prosthesis placed on the right with prosthetic components well-seated on frontal view. Total hip replacement also noted on the left with prosthetic components well-seated. No fracture or dislocation. Electronically Signed   By: Lowella Grip III M.D.   On: 05/14/2020 16:32    EKG: Orders placed or performed in visit on 03/20/20  . EKG 12-Lead     Hospital Course: Karen [name] is a 87 y.o. who was admitted to [facility]. They were brought to the operating room on 05/14/2020 and underwent Procedure(s): [place].  Patient tolerated the procedure well and was later transferred to the recovery room and then to the orthopaedic floor for postoperative care. They were given PO and IV analgesics for pain control following their surgery. They were given 24 hours of postoperative antibiotics of  Anti-infectives (From admission, onward)   Start     Dose/Rate Route Frequency Ordered Stop   05/14/20 1930  ceFAZolin (ANCEF) IVPB 2g/100 mL premix        2 g 200 mL/hr over 30 Minutes Intravenous Every 6 hours 05/14/20 1635 05/15/20 0134   05/14/20 1030  ceFAZolin (ANCEF) IVPB 2g/100 mL premix        2 g 200 mL/hr over 30 Minutes Intravenous On call to O.R. 05/14/20 1029 05/14/20 1249      and started on DVT prophylaxis in the form of Aspirin and TED hose.   PT and OT were ordered for total joint protocol. Discharge planning consulted to help with postop disposition and equipment needs.  Patient had an uneventful night on the evening of surgery. They started to get up OOB with therapy on 05/14/20. Pt was seen during rounds and was ready to go home pending progress with therapy. She worked with therapy on POD #1 and was meeting goals. Pt was discharged to home later that day in stable condition.  Diet: Regular diet Activity: WBAT Follow-up: in two weeks Disposition: Home Discharged Condition: good   Discharge Instructions    Call MD / Call 911   Complete by: As directed    If you experience chest pain or shortness of breath, CALL 911 and be transported to the hospital emergency room.  If you develope a fever above 101 F, pus (white drainage) or increased drainage or redness at the wound, or calf pain, call your surgeon's office.   Change dressing   Complete by: As directed    You have an adhesive waterproof bandage over the incision. Leave this in place until your first follow-up appointment. Once you remove this you will not need to place another bandage.   Constipation Prevention   Complete by: As directed  Drink plenty of fluids.  Prune juice may be helpful.  You may use a stool softener, such as Colace (over the counter) 100 mg twice a day.  Use MiraLax (over the counter) for constipation as needed.   Diet - low sodium heart healthy   Complete by: As directed    Do not sit on low chairs, stoools or toilet seats, as it may be difficult to get up from low surfaces   Complete by: As directed    Driving restrictions   Complete by: As directed    No driving for two weeks   TED hose   Complete by: As directed    Use stockings (TED hose) for three weeks on both leg(s).  You may remove them at night for sleeping.   Weight bearing as tolerated   Complete by: As directed       Allergies as of 05/15/2020      Reactions   Lisinopril Cough   Pneumococcal Vaccines Swelling, Other (See Comments)   Very bad pain and swelling in the arm of the shot, needed 2 cortisone shots.   Ace Inhibitors Cough   Carvedilol    Weakness, dizziness, upset stomach, trembling    Codeine Nausea And Vomiting   Morphine And Related Nausea And Vomiting   Ultram [tramadol Hcl] Itching      Medication List    STOP taking these medications   aspirin 81 MG tablet Replaced by: aspirin 325 MG EC tablet   meloxicam 7.5 MG tablet Commonly known as: MOBIC     TAKE these medications   amLODipine 5 MG tablet Commonly known as: NORVASC Take 1 tablet (5 mg total) by mouth daily. What changed: when to take this   aspirin 325 MG EC tablet Take 1 tablet (325 mg total) by mouth 2 (two) times daily. Then take one 81 mg aspirin once a day for three weeks. Then discontinue aspirin. Replaces: aspirin 81 MG tablet   atorvastatin 40 MG tablet Commonly known as: LIPITOR Take 40 mg by mouth at bedtime.   Calcium Carbonate-Vitamin D 600-400 MG-UNIT tablet Take 1 tablet by mouth daily.   CO Q 10 PO Take 300 mg by mouth daily.   furosemide 40 MG tablet Commonly known as: LASIX Take 1 tablet (40 mg total) by mouth daily.   glipiZIDE 5 MG tablet Commonly known as: GLUCOTROL Take 5 mg by mouth daily before breakfast.   HYDROcodone-acetaminophen 5-325 MG tablet Commonly known as: NORCO/VICODIN Take 1-2 tablets by mouth every 6 (six) hours as needed for severe pain.   Magnesium 250 MG Tabs Take by mouth.   metFORMIN 1000 MG tablet Commonly known as: GLUCOPHAGE Take 1 tablet (1,000 mg total) by mouth 2 (two) times daily with a meal. Hold for 2 days, restart on 10/14/2013   methocarbamol 500 MG tablet Commonly known as: ROBAXIN Take 1 tablet (500 mg total) by mouth every 6 (six) hours as needed for muscle spasms.   metoprolol succinate 50 MG 24 hr tablet Commonly known as:  TOPROL-XL Take 50 mg by mouth daily. Take with or immediately following a meal.   mirtazapine 15 MG tablet Commonly known as: REMERON Take 15 mg by mouth at bedtime.   multivitamin tablet Take 1 tablet by mouth daily.   potassium chloride SA 20 MEQ tablet Commonly known as: KLOR-CON Take 1 tablet (20 mEq total) by mouth 2 (two) times daily.   QUEtiapine 200 MG tablet Commonly known as: SEROQUEL Take 200 mg by mouth  at bedtime.   SYSTANE OP Place 1 drop into both eyes daily as needed (dry eyes).   valsartan 320 MG tablet Commonly known as: DIOVAN Take 1 tablet (320 mg total) by mouth daily.            Discharge Care Instructions  (From admission, onward)         Start     Ordered   05/15/20 0000  Weight bearing as tolerated        05/15/20 0730   05/15/20 0000  Change dressing       Comments: You have an adhesive waterproof bandage over the incision. Leave this in place until your first follow-up appointment. Once you remove this you will not need to place another bandage.   05/15/20 0730          Follow-up Information    Gaynelle Arabian, MD. Go on 05/27/2020.   Specialty: Orthopedic Surgery Why: You are scheduled for first post op appointment on Tuesday April 19th at 3:15pm.  Contact information: [address] [place] [address] [phone]               Signed: Fenton Foy, MBA, PA-C Orthopedic Surgery 05/19/2020, 7:21 AM

## 2020-06-17 DIAGNOSIS — Z4789 Encounter for other orthopedic aftercare: Secondary | ICD-10-CM | POA: Diagnosis not present

## 2020-06-17 DIAGNOSIS — Z471 Aftercare following joint replacement surgery: Secondary | ICD-10-CM | POA: Diagnosis not present

## 2020-07-01 DIAGNOSIS — E7800 Pure hypercholesterolemia, unspecified: Secondary | ICD-10-CM | POA: Diagnosis not present

## 2020-07-01 DIAGNOSIS — N183 Chronic kidney disease, stage 3 unspecified: Secondary | ICD-10-CM | POA: Diagnosis not present

## 2020-07-01 DIAGNOSIS — E1159 Type 2 diabetes mellitus with other circulatory complications: Secondary | ICD-10-CM | POA: Diagnosis not present

## 2020-07-01 DIAGNOSIS — E039 Hypothyroidism, unspecified: Secondary | ICD-10-CM | POA: Diagnosis not present

## 2020-07-01 DIAGNOSIS — E1122 Type 2 diabetes mellitus with diabetic chronic kidney disease: Secondary | ICD-10-CM | POA: Diagnosis not present

## 2020-07-01 DIAGNOSIS — E559 Vitamin D deficiency, unspecified: Secondary | ICD-10-CM | POA: Diagnosis not present

## 2020-07-09 DIAGNOSIS — E1159 Type 2 diabetes mellitus with other circulatory complications: Secondary | ICD-10-CM | POA: Diagnosis not present

## 2020-07-09 DIAGNOSIS — I2510 Atherosclerotic heart disease of native coronary artery without angina pectoris: Secondary | ICD-10-CM | POA: Diagnosis not present

## 2020-07-09 DIAGNOSIS — E1122 Type 2 diabetes mellitus with diabetic chronic kidney disease: Secondary | ICD-10-CM | POA: Diagnosis not present

## 2020-07-09 DIAGNOSIS — D649 Anemia, unspecified: Secondary | ICD-10-CM | POA: Diagnosis not present

## 2020-07-09 DIAGNOSIS — E7800 Pure hypercholesterolemia, unspecified: Secondary | ICD-10-CM | POA: Diagnosis not present

## 2020-07-09 DIAGNOSIS — E559 Vitamin D deficiency, unspecified: Secondary | ICD-10-CM | POA: Diagnosis not present

## 2020-07-09 DIAGNOSIS — I130 Hypertensive heart and chronic kidney disease with heart failure and stage 1 through stage 4 chronic kidney disease, or unspecified chronic kidney disease: Secondary | ICD-10-CM | POA: Diagnosis not present

## 2020-07-09 DIAGNOSIS — R946 Abnormal results of thyroid function studies: Secondary | ICD-10-CM | POA: Diagnosis not present

## 2020-07-09 DIAGNOSIS — G4700 Insomnia, unspecified: Secondary | ICD-10-CM | POA: Diagnosis not present

## 2020-07-09 DIAGNOSIS — I714 Abdominal aortic aneurysm, without rupture: Secondary | ICD-10-CM | POA: Diagnosis not present

## 2020-07-09 DIAGNOSIS — I10 Essential (primary) hypertension: Secondary | ICD-10-CM | POA: Diagnosis not present

## 2020-07-18 ENCOUNTER — Other Ambulatory Visit: Payer: Self-pay

## 2020-07-18 ENCOUNTER — Ambulatory Visit (INDEPENDENT_AMBULATORY_CARE_PROVIDER_SITE_OTHER): Payer: Medicare Other | Admitting: Cardiovascular Disease

## 2020-07-18 ENCOUNTER — Encounter: Payer: Self-pay | Admitting: Cardiovascular Disease

## 2020-07-18 VITALS — BP 135/79 | HR 84 | Ht 64.0 in | Wt 136.0 lb

## 2020-07-18 DIAGNOSIS — E7800 Pure hypercholesterolemia, unspecified: Secondary | ICD-10-CM

## 2020-07-18 DIAGNOSIS — I5032 Chronic diastolic (congestive) heart failure: Secondary | ICD-10-CM

## 2020-07-18 DIAGNOSIS — I2510 Atherosclerotic heart disease of native coronary artery without angina pectoris: Secondary | ICD-10-CM | POA: Diagnosis not present

## 2020-07-18 DIAGNOSIS — I712 Thoracic aortic aneurysm, without rupture, unspecified: Secondary | ICD-10-CM

## 2020-07-18 DIAGNOSIS — I10 Essential (primary) hypertension: Secondary | ICD-10-CM

## 2020-07-18 DIAGNOSIS — E1159 Type 2 diabetes mellitus with other circulatory complications: Secondary | ICD-10-CM | POA: Diagnosis not present

## 2020-07-18 NOTE — Progress Notes (Signed)
Patient ID: [name], female   DOB: 12-14-1928, 87 y.o.   MRN: [id number]    Cardiology Office Note    Date:  07/18/2020   ID:  Karen [name], DOB Feb 18, 1928, MRN [id number]  PCP:  Karen Morning, DO  Cardiologist:   Sanda Klein, MD   Chief Complaint  Patient presents with   Follow-up    4 months.    History of Present Illness:  Karen [name] is a 87 y.o. female with coronary artery disease, history of chronic diastolic heart failure (improved after treatment of coronary disease), hypertension and an ascending aortic aneurysm last measured  (June 2019) at 4.7 cm in maximum diameter.  She is doing great.  She is preparing to celebrate her 89th birthday next Monday.  She had total right hip replacement and has recovered very well, not requiring any assistive devices.  Continues to take care of all her own affairs and plans to cook desserts for the whole family.  The patient specifically denies any chest pain at rest exertion, dyspnea at rest or with exertion, orthopnea, paroxysmal nocturnal dyspnea, syncope, palpitations, focal neurological deficits, intermittent claudication, lower extremity edema, unexplained weight gain, cough, hemoptysis or wheezing.  She did very poorly with attempted transition from metoprolol to carvedilol, which caused diarrhea tremulousness and what sounds like rebound hypertension and rebound arrhythmia.  Instead of switching to carvedilol, she has increased the dose of amlodipine to 10 mg daily and this is working well for her blood pressure  She is kept a daily log of her blood pressure at home which is consistently in the 120-130s/70s.  Her blood pressure is only slightly higher than that today.  Glycemic control is excellent with a hemoglobin A1c of 6.5% and her most recent LDL cholesterol was acceptable at 74.  Renal parameters are slightly abnormal with a BUN of 42 and creatinine of 1.33.  She has stopped taking meloxicam and other NSAIDs.   Potassium was normal at 4.3.  Her TSH was mildly elevated at 6.7 but she has no symptoms for thyroidism.  On October 11 2013 she had 2.25 x 20 mm Promus to 90% L,AD- mid to distal and 3.5 x 12 mm Promus to 80% LCx-mid lesions. She had presented with profound fatigue and exertional dyspnea, but without angina. Symptoms did not resolve despite aggressive antiHTNive and antianginal pharmacological therapy. LVEF was 50%. Symptoms improved rapidly after placement of the stents. She has treated HTN and hyperlipidemia and mild DM.   Past Medical History:  Diagnosis Date   Arthritis    CHF (congestive heart failure) (HCC)    Constipation    Coronary artery disease 04/2013   Three-vessel disease, initial medical therapy then stenting of the LAD and CFX 10/2013   Diabetes mellitus    TYPE 2   Fibromyalgia    H/O: gout    Heart murmur    Hx: UTI (urinary tract infection)    Hypertension     Past Surgical History:  Procedure Laterality Date   [place] CATHETERIZATION  04/2013   Left main 30-40%, prox LAD 40%, mid/distal LAD 90%, CFX 70%, OM 3 90%, RCA 40-50%, PDA 20-80%, EF 50%    CHOLECYSTECTOMY  1951   Closed reduction of Left Total Hip  2008   x 3   CORONARY STENT PLACEMENT  10/11/2013   2.25 x 20 mm Promus DES to mid/distal LAD & 3.5 x 12 mm Promus DES  Mid CX         DR [place]   HERNIA REPAIR  1963   JOINT REPLACEMENT  left hip-2007, left knee-2000, right knee-1997   Left Hip, Bilateral Knee replacements   KNEE SURGERY  1997/2000   LEFT HEART CATHETERIZATION WITH CORONARY ANGIOGRAM N/A 04/25/2013   Procedure: LEFT HEART CATHETERIZATION WITH CORONARY ANGIOGRAM;  Surgeon: Sanda Klein, MD;  Location: [facility] CATH LAB;  Service: Cardiovascular;  Laterality: N/A;   PERCUTANEOUS CORONARY STENT INTERVENTION (PCI-S) N/A 10/11/2013   Procedure: PERCUTANEOUS CORONARY STENT INTERVENTION (PCI-S);  Surgeon: Peter M [place], MD;  Location: [facility] CATH LAB;  Service:  Cardiovascular;  Laterality: N/A;   REPLACEMENT TOTAL HIP W/  RESURFACING IMPLANTS  7829,[id number]   Right Foot Surgery  2004   x 2   Salivary Gland Stone Extraction     SPINAL FUSION  2006   TOTAL HIP ARTHROPLASTY Right 05/14/2020   Procedure: TOTAL HIP ARTHROPLASTY ANTERIOR APPROACH;  Surgeon: Gaynelle Arabian, MD;  Location: [facility] ORS;  Service: Orthopedics;  Laterality: Right;  161min    Current Medications: Outpatient Medications Prior to Visit  Medication Sig Dispense Refill   amLODipine (NORVASC) 5 MG tablet Take 1 tablet (5 mg total) by mouth daily. (Patient taking differently: Take 5 mg by mouth 2 (two) times daily.) 90 tablet 3   atorvastatin (LIPITOR) 40 MG tablet Take 40 mg by mouth at bedtime.     Calcium Carbonate-Vitamin D 600-400 MG-UNIT tablet Take 1 tablet by mouth daily.     Coenzyme Q10 (CO Q 10 PO) Take 300 mg by mouth daily.     furosemide (LASIX) 40 MG tablet Take 1 tablet (40 mg total) by mouth daily. 90 tablet 3   glipiZIDE (GLUCOTROL) 5 MG tablet Take 5 mg by mouth daily before breakfast.     HYDROcodone-acetaminophen (NORCO/VICODIN) 5-325 MG tablet Take 1-2 tablets by mouth every 6 (six) hours as needed for severe pain. 42 tablet 0   Magnesium 250 MG TABS Take by mouth.     metFORMIN (GLUCOPHAGE) 1000 MG tablet Take 1 tablet (1,000 mg total) by mouth 2 (two) times daily with a meal. Hold for 2 days, restart on 10/14/2013 60 tablet 0   methocarbamol (ROBAXIN) 500 MG tablet Take 1 tablet (500 mg total) by mouth every 6 (six) hours as needed for muscle spasms. 40 tablet 0   metoprolol succinate (TOPROL-XL) 50 MG 24 hr tablet Take 50 mg by mouth daily. Take with or immediately following a meal.     mirtazapine (REMERON) 15 MG tablet Take 15 mg by mouth at bedtime.     Multiple Vitamin (MULTIVITAMIN) tablet Take 1 tablet by mouth daily.     Polyethyl Glycol-Propyl Glycol (SYSTANE OP) Place 1 drop into both eyes daily as needed (dry eyes).     potassium chloride SA  (K-DUR,KLOR-CON) 20 MEQ tablet Take 1 tablet (20 mEq total) by mouth 2 (two) times daily. 180 tablet 3   QUEtiapine (SEROQUEL) 200 MG tablet Take 200 mg by mouth at bedtime.     valsartan (DIOVAN) 320 MG tablet Take 1 tablet (320 mg total) by mouth daily. 90 tablet 3   aspirin EC 325 MG EC tablet Take 1 tablet (325 mg total) by mouth 2 (two) times daily. Then take one 81 mg aspirin once a day for three weeks. Then discontinue aspirin. 42 tablet 0   No facility-administered medications prior to visit.     Allergies:   Lisinopril, Pneumococcal vaccines, Ace inhibitors, Carvedilol, Codeine, Morphine  and related, and Ultram [tramadol hcl]   Social History   Socioeconomic History   Marital status: Widowed    Spouse name: Not on file   Number of children: Not on file   Years of education: Not on file   Highest education level: Not on file  Occupational History   Not on file  Tobacco Use   Smoking status: Never   Smokeless tobacco: Never  Vaping Use   Vaping Use: Never used  Substance and Sexual Activity   Alcohol use: No   Drug use: No   Sexual activity: Never  Other Topics Concern   Not on file  Social History Narrative   Not on file   Social Determinants of Health   Financial Resource Strain: Not on file  Food Insecurity: Not on file  Transportation Needs: Not on file  Physical Activity: Not on file  Stress: Not on file  Social Connections: Not on file     Family History:  The patient's family history includes Cancer in her brother and mother; Heart attack in her brother and father; Stroke in her father.   ROS:   Please see the history of present illness.    All other systems are reviewed and are negative.   PHYSICAL EXAM:   VS:  BP 135/79 (BP Location: Left Arm, Patient Position: Sitting, Cuff Size: Normal)   Pulse 84   Ht 5\' 4"  (1.626 m)   Wt 136 lb (61.7 kg)   LMP 05/10/1998   BMI 23.34 kg/m      General: Alert, oriented x3, no distress Head: no evidence of  trauma, PERRL, EOMI, no exophtalmos or lid lag, no myxedema, no xanthelasma; normal ears, nose and oropharynx Neck: normal jugular venous pulsations and no hepatojugular reflux; brisk carotid pulses without delay and no carotid bruits Chest: clear to auscultation, no signs of consolidation by percussion or palpation, normal fremitus, symmetrical and full respiratory excursions Cardiovascular: normal position and quality of the apical impulse, regular rhythm, normal first and second heart sounds, there is a 1-2/6 early peaking aortic ejection murmur, no diastolic murmurs, rubs or gallops Abdomen: no tenderness or distention, no masses by palpation, no abnormal pulsatility or arterial bruits, normal bowel sounds, no hepatosplenomegaly Extremities: no clubbing, cyanosis or edema; 2+ radial, ulnar and brachial pulses bilaterally; 2+ right femoral, posterior tibial and dorsalis pedis pulses; 2+ left femoral, posterior tibial and dorsalis pedis pulses; no subclavian or femoral bruits.  Prominent changes of degenerative joint disease in all interphalangeal joints of both hands. Neurological: grossly nonfocal.  Psych: Normal mood and affect    Wt Readings from Last 3 Encounters:  07/18/20 136 lb (61.7 kg)  05/14/20 138 lb 0.1 oz (62.6 kg)  03/20/20 145 lb (65.8 kg)      Studies/Labs Reviewed:   EKG:  EKG is not ordered today.  Most recent tracing shows normal sinus rhythm, normal ECG  Echocardiogram 04/11/2020 reviewed. Although there is indeed significant mitral annulus calcification, I do not think there is any true mitral stenosis.  LVEF remains normal at 60 to 65%, although there are some signs of diastolic dysfunction.  Mild ascending aortic dilation is unchanged at 4.5 cm and there is mild aortic insufficiency.  There is no aortic valve stenosis. Recent Labs: 06/12/2019 Hemoglobin 12.9, creatinine 1.4 (GFR 37), potassium 4.6, normal liver function tests, Hemoglobin A1c 8.8%, Total  cholesterol 137, triglycerides 116, HDL 48, LDL 68  07/01/2020 Hemoglobin 11.1, TSH 6.7, BUN 42, creatinine 1.33, potassium 4.3 Hemoglobin A1c  7.3% Total cholesterol 153, HDL 57, LDL 74, triglycerides 111   ASSESSMENT:    1. Chronic diastolic heart failure ([place])   2. Coronary artery disease involving native coronary artery of native heart without angina pectoris   3. Thoracic aortic aneurysm without rupture ([place])   4. Essential hypertension   5. Pure hypercholesterolemia   6. Type 2 diabetes mellitus with other circulatory complication, without long-term current use of insulin (HCC)       PLAN:  In order of problems listed above:  CHF: Most recent EF is completely normal.  She remains clinically euvolemic on a low-dose of loop diuretic.  NYHA functional class I.  Remarkably active 87 year old. CAD: Its been about 7 years since her revascularization procedure and she has not had any cardiovascular complaints since then.  She never had angina pectoris but presented with shortness of breath symptoms as expression of LAD and left circumflex coronary stenosis.   TAAA: Asymptomatic.  Has been stable in size.  She is scheduled for a follow-up CT angiogram on June 28.  Aortic insufficiency is only mild. HTN: Excellent control.  Continue the current medications.  Note that she was extremely poorly tolerant of carvedilol. HLP: All lipid parameters are in desirable range or very close to that range. DM: Improved glycemic control.    Medication Adjustments/Labs and Tests Ordered: Current medicines are reviewed at length with the patient today.  Concerns regarding medicines are outlined above.  Medication changes, Labs and Tests ordered today are listed in the Patient Instructions below. Patient Instructions  Medication Instructions:  No changes *If you need a refill on your cardiac medications before your next appointment, please call your pharmacy*   Lab Work: None ordered If you have  labs (blood work) drawn today and your tests are completely normal, you will receive your results only by: [place] (if you have MyChart) OR A paper copy in the mail If you have any lab test that is abnormal or we need to change your treatment, we will call you to review the results.   Testing/Procedures: None ordered   Follow-Up: At [facility], you and your health needs are our priority.  As part of our continuing mission to provide you with exceptional heart care, we have created designated Provider Care Teams.  These Care Teams include your primary Cardiologist (physician) and Advanced Practice Providers (APPs -  Physician Assistants and Nurse Practitioners) who all work together to provide you with the care you need, when you need it.  We recommend signing up for the patient portal called "MyChart".  Sign up information is provided on this After Visit Summary.  MyChart is used to connect with patients for Virtual Visits (Telemedicine).  Patients are able to view lab/test results, encounter notes, upcoming appointments, etc.  Non-urgent messages can be sent to your provider as well.   To learn more about what you can do with MyChart, go to [email or web address].    Your next appointment:   12 month(s)  The format for your next appointment:   In Person  Provider:   You may see Sanda Klein, MD or one of the following Advanced Practice Providers on your designated Care Team:   Almyra Deforest, PA-C Fabian Sharp, [place] or  Roby Lofts, PA-C    Signed, Sanda Klein, MD  07/18/2020 12:38 PM    [address], [place], [place]  [address] Phone: [phone]; Fax: [phone]

## 2020-07-18 NOTE — Patient Instructions (Signed)

## 2020-08-01 ENCOUNTER — Other Ambulatory Visit: Payer: Self-pay

## 2020-08-01 DIAGNOSIS — I712 Thoracic aortic aneurysm, without rupture, unspecified: Secondary | ICD-10-CM

## 2020-08-02 LAB — BASIC METABOLIC PANEL
BUN/Creatinine Ratio: 26 (ref 12–28)
BUN: 41 mg/dL — ABNORMAL HIGH (ref 10–36)
CO2: 23 mmol/L (ref 20–29)
Calcium: 9.6 mg/dL (ref 8.7–10.3)
Chloride: 99 mmol/L (ref 96–106)
Creatinine, Ser: 1.58 mg/dL — ABNORMAL HIGH (ref 0.57–1.00)
Glucose: 250 mg/dL — ABNORMAL HIGH (ref 65–99)
Potassium: 4.4 mmol/L (ref 3.5–5.2)
Sodium: 140 mmol/L (ref 134–144)
eGFR: 31 mL/min/{1.73_m2} — ABNORMAL LOW (ref >59)

## 2020-08-05 ENCOUNTER — Ambulatory Visit (HOSPITAL_COMMUNITY)
Admission: RE | Admit: 2020-08-05 | Discharge: 2020-08-05 | Disposition: A | Discharge status: Home / Self Care | Payer: Medicare Other | Source: Ambulatory Visit | Attending: Cardiovascular Disease | Admitting: Cardiovascular Disease

## 2020-08-05 ENCOUNTER — Other Ambulatory Visit: Payer: Self-pay

## 2020-08-05 DIAGNOSIS — I712 Thoracic aortic aneurysm, without rupture, unspecified: Secondary | ICD-10-CM

## 2020-08-05 MED ORDER — IOHEXOL 350 MG/ML SOLN
75.0000 mL | Freq: Once | INTRAVENOUS | Status: AC | PRN
  Administered 2020-08-05: 75 mL via INTRAVENOUS

## 2020-08-08 ENCOUNTER — Other Ambulatory Visit: Payer: Self-pay | Admitting: *Deleted

## 2020-08-08 MED ORDER — FUROSEMIDE 20 MG PO TABS: 20.0000 mg | ORAL_TABLET | Freq: Every day | ORAL | 5 refills | Status: DC

## 2020-09-16 DIAGNOSIS — M1990 Unspecified osteoarthritis, unspecified site: Secondary | ICD-10-CM | POA: Diagnosis not present

## 2020-09-16 DIAGNOSIS — J329 Chronic sinusitis, unspecified: Secondary | ICD-10-CM | POA: Diagnosis not present

## 2020-11-13 DIAGNOSIS — D225 Melanocytic nevi of trunk: Secondary | ICD-10-CM | POA: Diagnosis not present

## 2020-11-13 DIAGNOSIS — D2230 Melanocytic nevi of unspecified part of face: Secondary | ICD-10-CM | POA: Diagnosis not present

## 2020-11-13 DIAGNOSIS — D2272 Melanocytic nevi of left lower limb, including hip: Secondary | ICD-10-CM | POA: Diagnosis not present

## 2020-11-13 DIAGNOSIS — L578 Other skin changes due to chronic exposure to nonionizing radiation: Secondary | ICD-10-CM | POA: Diagnosis not present

## 2020-11-13 DIAGNOSIS — L821 Other seborrheic keratosis: Secondary | ICD-10-CM | POA: Diagnosis not present

## 2020-11-13 DIAGNOSIS — Z86018 Personal history of other benign neoplasm: Secondary | ICD-10-CM | POA: Diagnosis not present

## 2020-11-18 DIAGNOSIS — Z20822 Contact with and (suspected) exposure to covid-19: Secondary | ICD-10-CM | POA: Diagnosis not present

## 2021-01-08 DIAGNOSIS — E1122 Type 2 diabetes mellitus with diabetic chronic kidney disease: Secondary | ICD-10-CM | POA: Diagnosis not present

## 2021-01-08 DIAGNOSIS — D649 Anemia, unspecified: Secondary | ICD-10-CM | POA: Diagnosis not present

## 2021-01-08 DIAGNOSIS — R946 Abnormal results of thyroid function studies: Secondary | ICD-10-CM | POA: Diagnosis not present

## 2021-01-08 DIAGNOSIS — I2510 Atherosclerotic heart disease of native coronary artery without angina pectoris: Secondary | ICD-10-CM | POA: Diagnosis not present

## 2021-01-08 DIAGNOSIS — E7800 Pure hypercholesterolemia, unspecified: Secondary | ICD-10-CM | POA: Diagnosis not present

## 2021-01-08 DIAGNOSIS — I130 Hypertensive heart and chronic kidney disease with heart failure and stage 1 through stage 4 chronic kidney disease, or unspecified chronic kidney disease: Secondary | ICD-10-CM | POA: Diagnosis not present

## 2021-01-15 DIAGNOSIS — E1122 Type 2 diabetes mellitus with diabetic chronic kidney disease: Secondary | ICD-10-CM | POA: Diagnosis not present

## 2021-01-15 DIAGNOSIS — E7800 Pure hypercholesterolemia, unspecified: Secondary | ICD-10-CM | POA: Diagnosis not present

## 2021-01-15 DIAGNOSIS — I5022 Chronic systolic (congestive) heart failure: Secondary | ICD-10-CM | POA: Diagnosis not present

## 2021-01-15 DIAGNOSIS — N183 Chronic kidney disease, stage 3 unspecified: Secondary | ICD-10-CM | POA: Diagnosis not present

## 2021-01-15 DIAGNOSIS — M1990 Unspecified osteoarthritis, unspecified site: Secondary | ICD-10-CM | POA: Diagnosis not present

## 2021-01-15 DIAGNOSIS — G4700 Insomnia, unspecified: Secondary | ICD-10-CM | POA: Diagnosis not present

## 2021-01-15 DIAGNOSIS — I130 Hypertensive heart and chronic kidney disease with heart failure and stage 1 through stage 4 chronic kidney disease, or unspecified chronic kidney disease: Secondary | ICD-10-CM | POA: Diagnosis not present

## 2021-04-08 ENCOUNTER — Other Ambulatory Visit: Payer: Self-pay

## 2021-04-08 ENCOUNTER — Inpatient Hospital Stay (HOSPITAL_COMMUNITY)
Admission: EM | Admit: 2021-04-08 | Discharge: 2021-04-11 | DRG: 468 | Disposition: A | Discharge status: Home Health | Payer: Medicare Other | Attending: Orthopedic Surgery | Admitting: Orthopedic Surgery

## 2021-04-08 ENCOUNTER — Encounter (HOSPITAL_COMMUNITY): Payer: Self-pay

## 2021-04-08 ENCOUNTER — Emergency Department (HOSPITAL_COMMUNITY): Payer: Medicare Other | Admitting: Certified Registered"

## 2021-04-08 ENCOUNTER — Encounter (HOSPITAL_COMMUNITY)
Admission: EM | Disposition: A | Discharge status: Home Health | Payer: Self-pay | Source: Home / Self Care | Attending: Orthopedic Surgery

## 2021-04-08 ENCOUNTER — Emergency Department (HOSPITAL_COMMUNITY): Payer: Medicare Other

## 2021-04-08 DIAGNOSIS — Z79899 Other long term (current) drug therapy: Secondary | ICD-10-CM | POA: Diagnosis not present

## 2021-04-08 DIAGNOSIS — Z823 Family history of stroke: Secondary | ICD-10-CM | POA: Diagnosis not present

## 2021-04-08 DIAGNOSIS — M25572 Pain in left ankle and joints of left foot: Secondary | ICD-10-CM | POA: Diagnosis present

## 2021-04-08 DIAGNOSIS — Z7984 Long term (current) use of oral hypoglycemic drugs: Secondary | ICD-10-CM

## 2021-04-08 DIAGNOSIS — Z888 Allergy status to other drugs, medicaments and biological substances status: Secondary | ICD-10-CM | POA: Diagnosis not present

## 2021-04-08 DIAGNOSIS — I509 Heart failure, unspecified: Secondary | ICD-10-CM

## 2021-04-08 DIAGNOSIS — I25119 Atherosclerotic heart disease of native coronary artery with unspecified angina pectoris: Secondary | ICD-10-CM

## 2021-04-08 DIAGNOSIS — Z8249 Family history of ischemic heart disease and other diseases of the circulatory system: Secondary | ICD-10-CM

## 2021-04-08 DIAGNOSIS — Z885 Allergy status to narcotic agent status: Secondary | ICD-10-CM

## 2021-04-08 DIAGNOSIS — Y792 Prosthetic and other implants, materials and accessory orthopedic devices associated with adverse incidents: Secondary | ICD-10-CM | POA: Diagnosis present

## 2021-04-08 DIAGNOSIS — M25511 Pain in right shoulder: Secondary | ICD-10-CM | POA: Diagnosis present

## 2021-04-08 DIAGNOSIS — M797 Fibromyalgia: Secondary | ICD-10-CM | POA: Diagnosis present

## 2021-04-08 DIAGNOSIS — W19XXXA Unspecified fall, initial encounter: Secondary | ICD-10-CM | POA: Diagnosis not present

## 2021-04-08 DIAGNOSIS — S7012XA Contusion of left thigh, initial encounter: Secondary | ICD-10-CM | POA: Diagnosis present

## 2021-04-08 DIAGNOSIS — Z96642 Presence of left artificial hip joint: Secondary | ICD-10-CM | POA: Diagnosis not present

## 2021-04-08 DIAGNOSIS — Z955 Presence of coronary angioplasty implant and graft: Secondary | ICD-10-CM | POA: Diagnosis not present

## 2021-04-08 DIAGNOSIS — S99912A Unspecified injury of left ankle, initial encounter: Secondary | ICD-10-CM | POA: Diagnosis not present

## 2021-04-08 DIAGNOSIS — Z20822 Contact with and (suspected) exposure to covid-19: Secondary | ICD-10-CM | POA: Diagnosis present

## 2021-04-08 DIAGNOSIS — T84021A Dislocation of internal left hip prosthesis, initial encounter: Principal | ICD-10-CM | POA: Diagnosis present

## 2021-04-08 DIAGNOSIS — D500 Iron deficiency anemia secondary to blood loss (chronic): Secondary | ICD-10-CM | POA: Diagnosis present

## 2021-04-08 DIAGNOSIS — W182XXA Fall in (into) shower or empty bathtub, initial encounter: Secondary | ICD-10-CM | POA: Diagnosis present

## 2021-04-08 DIAGNOSIS — Z981 Arthrodesis status: Secondary | ICD-10-CM

## 2021-04-08 DIAGNOSIS — I2510 Atherosclerotic heart disease of native coronary artery without angina pectoris: Secondary | ICD-10-CM | POA: Diagnosis present

## 2021-04-08 DIAGNOSIS — R000 Tachycardia, unspecified: Secondary | ICD-10-CM | POA: Diagnosis not present

## 2021-04-08 DIAGNOSIS — I110 Hypertensive heart disease with heart failure: Secondary | ICD-10-CM

## 2021-04-08 DIAGNOSIS — S73005A Unspecified dislocation of left hip, initial encounter: Secondary | ICD-10-CM

## 2021-04-08 DIAGNOSIS — Z96653 Presence of artificial knee joint, bilateral: Secondary | ICD-10-CM | POA: Diagnosis present

## 2021-04-08 DIAGNOSIS — K5900 Constipation, unspecified: Secondary | ICD-10-CM | POA: Diagnosis present

## 2021-04-08 DIAGNOSIS — Y93E1 Activity, personal bathing and showering: Secondary | ICD-10-CM

## 2021-04-08 DIAGNOSIS — Z887 Allergy status to serum and vaccine status: Secondary | ICD-10-CM

## 2021-04-08 DIAGNOSIS — M25552 Pain in left hip: Secondary | ICD-10-CM | POA: Diagnosis not present

## 2021-04-08 DIAGNOSIS — Z791 Long term (current) use of non-steroidal anti-inflammatories (NSAID): Secondary | ICD-10-CM

## 2021-04-08 DIAGNOSIS — E119 Type 2 diabetes mellitus without complications: Secondary | ICD-10-CM | POA: Diagnosis present

## 2021-04-08 DIAGNOSIS — Z809 Family history of malignant neoplasm, unspecified: Secondary | ICD-10-CM | POA: Diagnosis not present

## 2021-04-08 DIAGNOSIS — S4991XA Unspecified injury of right shoulder and upper arm, initial encounter: Secondary | ICD-10-CM | POA: Diagnosis not present

## 2021-04-08 DIAGNOSIS — Z96649 Presence of unspecified artificial hip joint: Secondary | ICD-10-CM

## 2021-04-08 DIAGNOSIS — M19011 Primary osteoarthritis, right shoulder: Secondary | ICD-10-CM | POA: Diagnosis not present

## 2021-04-08 HISTORY — PX: TOTAL HIP REVISION: SHX763

## 2021-04-08 LAB — CBC WITH DIFFERENTIAL/PLATELET
Abs Immature Granulocytes: 0.06 10*3/uL (ref 0.00–0.07)
Basophils Absolute: 0 10*3/uL (ref 0.0–0.1)
Basophils Relative: 0 %
Eosinophils Absolute: 0.1 10*3/uL (ref 0.0–0.5)
Eosinophils Relative: 1 %
HCT: 35.8 % — ABNORMAL LOW (ref 36.0–46.0)
Hemoglobin: 11.9 g/dL — ABNORMAL LOW (ref 12.0–15.0)
Immature Granulocytes: 1 %
Lymphocytes Relative: 8 %
Lymphs Abs: 1 10*3/uL (ref 0.7–4.0)
MCH: 34.1 pg — ABNORMAL HIGH (ref 26.0–34.0)
MCHC: 33.2 g/dL (ref 30.0–36.0)
MCV: 102.6 fL — ABNORMAL HIGH (ref 80.0–100.0)
Monocytes Absolute: 0.9 10*3/uL (ref 0.1–1.0)
Monocytes Relative: 7 %
Neutro Abs: 10.8 10*3/uL — ABNORMAL HIGH (ref 1.7–7.7)
Neutrophils Relative %: 83 %
Platelets: 175 10*3/uL (ref 150–400)
RBC: 3.49 MIL/uL — ABNORMAL LOW (ref 3.87–5.11)
RDW: 12.7 % (ref 11.5–15.5)
WBC: 12.8 10*3/uL — ABNORMAL HIGH (ref 4.0–10.5)
nRBC: 0 % (ref 0.0–0.2)

## 2021-04-08 LAB — RESP PANEL BY RT-PCR (FLU A&B, COVID) ARPGX2
Influenza A by PCR: NEGATIVE
Influenza B by PCR: NEGATIVE
SARS Coronavirus 2 by RT PCR: NEGATIVE

## 2021-04-08 LAB — BASIC METABOLIC PANEL
Anion gap: 12 (ref 5–15)
BUN: 51 mg/dL — ABNORMAL HIGH (ref 8–23)
CO2: 22 mmol/L (ref 22–32)
Calcium: 10 mg/dL (ref 8.9–10.3)
Chloride: 100 mmol/L (ref 98–111)
Creatinine, Ser: 1.75 mg/dL — ABNORMAL HIGH (ref 0.44–1.00)
GFR, Estimated: 27 mL/min — ABNORMAL LOW (ref >60)
Glucose, Bld: 355 mg/dL — ABNORMAL HIGH (ref 70–99)
Potassium: 4.3 mmol/L (ref 3.5–5.1)
Sodium: 134 mmol/L — ABNORMAL LOW (ref 135–145)

## 2021-04-08 LAB — GLUCOSE, CAPILLARY
Glucose-Capillary: 228 mg/dL — ABNORMAL HIGH (ref 70–99)
Glucose-Capillary: 221 mg/dL — ABNORMAL HIGH (ref 70–99)

## 2021-04-08 LAB — CBG MONITORING, ED: Glucose-Capillary: 312 mg/dL — ABNORMAL HIGH (ref 70–99)

## 2021-04-08 LAB — SURGICAL PCR SCREEN
MRSA, PCR: NEGATIVE
Staphylococcus aureus: NEGATIVE

## 2021-04-08 SURGERY — TOTAL HIP REVISION
Anesthesia: General | Site: Hip | Laterality: Left

## 2021-04-08 MED ORDER — SODIUM CHLORIDE 0.9 % IV SOLN: INTRAVENOUS | Status: DC

## 2021-04-08 MED ORDER — SODIUM CHLORIDE 0.9 % IV BOLUS
500.0000 mL | Freq: Once | INTRAVENOUS | Status: AC
  Administered 2021-04-08: 500 mL via INTRAVENOUS

## 2021-04-08 MED ORDER — ROCURONIUM BROMIDE 10 MG/ML (PF) SYRINGE
PREFILLED_SYRINGE | INTRAVENOUS | Status: DC | PRN
  Administered 2021-04-08: 60 mg via INTRAVENOUS

## 2021-04-08 MED ORDER — ACETAMINOPHEN 10 MG/ML IV SOLN
INTRAVENOUS | Status: AC
  Filled 2021-04-08: qty 100

## 2021-04-08 MED ORDER — METOPROLOL SUCCINATE ER 50 MG PO TB24
50.0000 mg | ORAL_TABLET | Freq: Every day | ORAL | Status: DC
  Administered 2021-04-09 – 2021-04-11 (×3): 50 mg via ORAL
  Filled 2021-04-08 (×3): qty 1

## 2021-04-08 MED ORDER — MENTHOL 3 MG MT LOZG: 1.0000 | LOZENGE | OROMUCOSAL | Status: DC | PRN

## 2021-04-08 MED ORDER — PROPOFOL 10 MG/ML IV BOLUS
INTRAVENOUS | Status: DC | PRN
  Administered 2021-04-08: 90 mg via INTRAVENOUS

## 2021-04-08 MED ORDER — DEXAMETHASONE SODIUM PHOSPHATE 10 MG/ML IJ SOLN
INTRAMUSCULAR | Status: DC | PRN
Start: 2021-04-08 — End: 2021-04-08
  Administered 2021-04-08: 4 mg via INTRAVENOUS

## 2021-04-08 MED ORDER — AMLODIPINE BESYLATE 5 MG PO TABS
5.0000 mg | ORAL_TABLET | Freq: Two times a day (BID) | ORAL | Status: DC
  Administered 2021-04-08 – 2021-04-11 (×5): 5 mg via ORAL
  Filled 2021-04-08 (×5): qty 1

## 2021-04-08 MED ORDER — METHOCARBAMOL 500 MG PO TABS
500.0000 mg | ORAL_TABLET | Freq: Four times a day (QID) | ORAL | Status: DC | PRN
  Administered 2021-04-08 – 2021-04-09 (×2): 500 mg via ORAL
  Filled 2021-04-08 (×2): qty 1

## 2021-04-08 MED ORDER — FENTANYL CITRATE (PF) 100 MCG/2ML IJ SOLN
INTRAMUSCULAR | Status: AC
  Filled 2021-04-08: qty 2

## 2021-04-08 MED ORDER — FENTANYL CITRATE PF 50 MCG/ML IJ SOSY: 50.0000 ug | PREFILLED_SYRINGE | INTRAMUSCULAR | Status: DC | PRN

## 2021-04-08 MED ORDER — LACTATED RINGERS IV SOLN: INTRAVENOUS | Status: DC

## 2021-04-08 MED ORDER — PHENYLEPHRINE HCL-NACL 20-0.9 MG/250ML-% IV SOLN
INTRAVENOUS | Status: DC | PRN
  Administered 2021-04-08: 20 ug/min via INTRAVENOUS

## 2021-04-08 MED ORDER — ACETAMINOPHEN 325 MG PO TABS: 325.0000 mg | ORAL_TABLET | Freq: Four times a day (QID) | ORAL | Status: DC | PRN

## 2021-04-08 MED ORDER — ACETAMINOPHEN 10 MG/ML IV SOLN
1000.0000 mg | Freq: Once | INTRAVENOUS | Status: AC
  Administered 2021-04-08: 1000 mg via INTRAVENOUS

## 2021-04-08 MED ORDER — PHENYLEPHRINE HCL (PRESSORS) 10 MG/ML IV SOLN
INTRAVENOUS | Status: AC
  Filled 2021-04-08: qty 1

## 2021-04-08 MED ORDER — FENTANYL CITRATE PF 50 MCG/ML IJ SOSY
PREFILLED_SYRINGE | INTRAMUSCULAR | Status: AC
  Filled 2021-04-08: qty 1

## 2021-04-08 MED ORDER — DEXAMETHASONE SODIUM PHOSPHATE 10 MG/ML IJ SOLN
INTRAMUSCULAR | Status: AC
  Filled 2021-04-08: qty 1

## 2021-04-08 MED ORDER — MIRTAZAPINE 15 MG PO TABS
15.0000 mg | ORAL_TABLET | Freq: Every day | ORAL | Status: DC
  Administered 2021-04-08 – 2021-04-10 (×3): 15 mg via ORAL
  Filled 2021-04-08 (×3): qty 1

## 2021-04-08 MED ORDER — SODIUM CHLORIDE 0.9 % IR SOLN
Status: DC | PRN
  Administered 2021-04-08: 1000 mL

## 2021-04-08 MED ORDER — HYDROCODONE-ACETAMINOPHEN 5-325 MG PO TABS
1.0000 | ORAL_TABLET | ORAL | Status: DC | PRN
  Administered 2021-04-09: 1 via ORAL
  Administered 2021-04-09 (×3): 2 via ORAL
  Administered 2021-04-10: 1 via ORAL
  Administered 2021-04-10: 2 via ORAL
  Administered 2021-04-10: 1 via ORAL
  Administered 2021-04-10 – 2021-04-11 (×3): 2 via ORAL
  Filled 2021-04-08: qty 2
  Filled 2021-04-08: qty 1
  Filled 2021-04-08 (×3): qty 2
  Filled 2021-04-08: qty 1
  Filled 2021-04-08 (×2): qty 2
  Filled 2021-04-08: qty 1
  Filled 2021-04-08: qty 2

## 2021-04-08 MED ORDER — METOCLOPRAMIDE HCL 5 MG/ML IJ SOLN: 5.0000 mg | Freq: Three times a day (TID) | INTRAMUSCULAR | Status: DC | PRN

## 2021-04-08 MED ORDER — CEFAZOLIN SODIUM-DEXTROSE 2-4 GM/100ML-% IV SOLN
2.0000 g | Freq: Once | INTRAVENOUS | Status: AC
  Administered 2021-04-08: 2 g via INTRAVENOUS

## 2021-04-08 MED ORDER — LIDOCAINE 2% (20 MG/ML) 5 ML SYRINGE
INTRAMUSCULAR | Status: DC | PRN
  Administered 2021-04-08: 40 mg via INTRAVENOUS

## 2021-04-08 MED ORDER — OYSTER SHELL CALCIUM/D3 500-5 MG-MCG PO TABS
1.0000 | ORAL_TABLET | Freq: Every day | ORAL | Status: DC
  Administered 2021-04-09 – 2021-04-11 (×3): 1 via ORAL
  Filled 2021-04-08 (×3): qty 1

## 2021-04-08 MED ORDER — ASPIRIN EC 325 MG PO TBEC
325.0000 mg | DELAYED_RELEASE_TABLET | Freq: Every day | ORAL | Status: DC
  Administered 2021-04-09 – 2021-04-11 (×3): 325 mg via ORAL
  Filled 2021-04-08 (×3): qty 1

## 2021-04-08 MED ORDER — ONDANSETRON HCL 4 MG/2ML IJ SOLN
INTRAMUSCULAR | Status: DC | PRN
Start: 2021-04-08 — End: 2021-04-08
  Administered 2021-04-08: 4 mg via INTRAVENOUS

## 2021-04-08 MED ORDER — SUGAMMADEX SODIUM 200 MG/2ML IV SOLN
INTRAVENOUS | Status: DC | PRN
  Administered 2021-04-08: 150 mg via INTRAVENOUS

## 2021-04-08 MED ORDER — ROCURONIUM BROMIDE 10 MG/ML (PF) SYRINGE
PREFILLED_SYRINGE | INTRAVENOUS | Status: AC
  Filled 2021-04-08: qty 10

## 2021-04-08 MED ORDER — CEFAZOLIN SODIUM-DEXTROSE 2-4 GM/100ML-% IV SOLN
INTRAVENOUS | Status: AC
  Filled 2021-04-08: qty 100

## 2021-04-08 MED ORDER — PHENOL 1.4 % MT LIQD: 1.0000 | OROMUCOSAL | Status: DC | PRN

## 2021-04-08 MED ORDER — FUROSEMIDE 20 MG PO TABS
20.0000 mg | ORAL_TABLET | Freq: Every day | ORAL | Status: DC
  Administered 2021-04-10 – 2021-04-11 (×2): 20 mg via ORAL
  Filled 2021-04-08 (×2): qty 1

## 2021-04-08 MED ORDER — CEFAZOLIN SODIUM-DEXTROSE 2-4 GM/100ML-% IV SOLN
2.0000 g | Freq: Four times a day (QID) | INTRAVENOUS | Status: AC
  Administered 2021-04-08 – 2021-04-09 (×2): 2 g via INTRAVENOUS
  Filled 2021-04-08 (×2): qty 100

## 2021-04-08 MED ORDER — FENTANYL CITRATE PF 50 MCG/ML IJ SOSY: 25.0000 ug | PREFILLED_SYRINGE | INTRAMUSCULAR | Status: DC | PRN

## 2021-04-08 MED ORDER — CHLORHEXIDINE GLUCONATE 0.12 % MT SOLN
15.0000 mL | OROMUCOSAL | Status: AC
Start: 2021-04-08 — End: 2021-04-08
  Administered 2021-04-08: 15 mL via OROMUCOSAL

## 2021-04-08 MED ORDER — METOCLOPRAMIDE HCL 5 MG PO TABS: 5.0000 mg | ORAL_TABLET | Freq: Three times a day (TID) | ORAL | Status: DC | PRN

## 2021-04-08 MED ORDER — FENTANYL CITRATE PF 50 MCG/ML IJ SOSY
25.0000 ug | PREFILLED_SYRINGE | INTRAMUSCULAR | Status: DC | PRN
  Administered 2021-04-08: 25 ug via INTRAVENOUS

## 2021-04-08 MED ORDER — LIDOCAINE HCL (PF) 2 % IJ SOLN
INTRAMUSCULAR | Status: AC
  Filled 2021-04-08: qty 5

## 2021-04-08 MED ORDER — DOCUSATE SODIUM 100 MG PO CAPS
100.0000 mg | ORAL_CAPSULE | Freq: Two times a day (BID) | ORAL | Status: DC
  Administered 2021-04-08 – 2021-04-11 (×6): 100 mg via ORAL
  Filled 2021-04-08 (×6): qty 1

## 2021-04-08 MED ORDER — QUETIAPINE FUMARATE 50 MG PO TABS
200.0000 mg | ORAL_TABLET | Freq: Every day | ORAL | Status: DC
  Administered 2021-04-08 – 2021-04-10 (×3): 200 mg via ORAL
  Filled 2021-04-08 (×3): qty 4

## 2021-04-08 MED ORDER — POTASSIUM CHLORIDE CRYS ER 20 MEQ PO TBCR
20.0000 meq | EXTENDED_RELEASE_TABLET | Freq: Two times a day (BID) | ORAL | Status: DC
  Administered 2021-04-08 – 2021-04-11 (×6): 20 meq via ORAL
  Filled 2021-04-08 (×6): qty 1

## 2021-04-08 MED ORDER — ONDANSETRON HCL 4 MG/2ML IJ SOLN
INTRAMUSCULAR | Status: AC
  Filled 2021-04-08: qty 2

## 2021-04-08 MED ORDER — ONDANSETRON HCL 4 MG/2ML IJ SOLN: 4.0000 mg | Freq: Four times a day (QID) | INTRAMUSCULAR | Status: DC | PRN

## 2021-04-08 MED ORDER — FENTANYL CITRATE (PF) 250 MCG/5ML IJ SOLN
INTRAMUSCULAR | Status: DC | PRN
  Administered 2021-04-08 (×2): 50 ug via INTRAVENOUS

## 2021-04-08 MED ORDER — ACETAMINOPHEN 500 MG PO TABS
500.0000 mg | ORAL_TABLET | Freq: Four times a day (QID) | ORAL | Status: AC
  Administered 2021-04-09: 500 mg via ORAL
  Filled 2021-04-08 (×2): qty 1

## 2021-04-08 MED ORDER — PROPOFOL 10 MG/ML IV BOLUS: 1.0000 mg/kg | Freq: Once | INTRAVENOUS | Status: DC

## 2021-04-08 MED ORDER — IRBESARTAN 150 MG PO TABS
300.0000 mg | ORAL_TABLET | Freq: Every day | ORAL | Status: DC
  Administered 2021-04-10 – 2021-04-11 (×2): 300 mg via ORAL
  Filled 2021-04-08 (×2): qty 2

## 2021-04-08 MED ORDER — ATORVASTATIN CALCIUM 40 MG PO TABS
40.0000 mg | ORAL_TABLET | Freq: Every day | ORAL | Status: DC
  Administered 2021-04-08 – 2021-04-10 (×3): 40 mg via ORAL
  Filled 2021-04-08 (×3): qty 1

## 2021-04-08 MED ORDER — METHOCARBAMOL 1000 MG/10ML IJ SOLN
500.0000 mg | Freq: Four times a day (QID) | INTRAVENOUS | Status: DC | PRN
  Filled 2021-04-08: qty 5

## 2021-04-08 MED ORDER — TRANEXAMIC ACID-NACL 1000-0.7 MG/100ML-% IV SOLN
INTRAVENOUS | Status: AC
  Filled 2021-04-08: qty 100

## 2021-04-08 MED ORDER — ONDANSETRON HCL 4 MG PO TABS: 4.0000 mg | ORAL_TABLET | Freq: Four times a day (QID) | ORAL | Status: DC | PRN

## 2021-04-08 MED ORDER — PROPOFOL 10 MG/ML IV BOLUS
INTRAVENOUS | Status: AC
  Filled 2021-04-08: qty 20

## 2021-04-08 MED ORDER — INSULIN ASPART 100 UNIT/ML IJ SOLN
0.0000 [IU] | Freq: Three times a day (TID) | INTRAMUSCULAR | Status: DC
  Administered 2021-04-09 (×2): 8 [IU] via SUBCUTANEOUS
  Administered 2021-04-09: 10 [IU] via SUBCUTANEOUS
  Administered 2021-04-10 (×2): 8 [IU] via SUBCUTANEOUS
  Administered 2021-04-11: 5 [IU] via SUBCUTANEOUS
  Administered 2021-04-11: 11 [IU] via SUBCUTANEOUS

## 2021-04-08 SURGICAL SUPPLY — 63 items
BAG COUNTER SPONGE SURGICOUNT (BAG) IMPLANT
BAG DECANTER FOR FLEXI CONT (MISCELLANEOUS) ×2 IMPLANT
BAG ZIPLOCK 12X15 (MISCELLANEOUS) ×4 IMPLANT
BIT DRILL 2.8X128 (BIT) ×1 IMPLANT
BLADE SAW SAG 73X25 THK (BLADE) ×1
BLADE SAW SGTL 73X25 THK (BLADE) IMPLANT
CLSR STERI-STRIP ANTIMIC 1/2X4 (GAUZE/BANDAGES/DRESSINGS) ×2 IMPLANT
COVER SURGICAL LIGHT HANDLE (MISCELLANEOUS) ×2 IMPLANT
DRAPE INCISE IOBAN 66X45 STRL (DRAPES) ×2 IMPLANT
DRAPE ORTHO SPLIT 77X108 STRL (DRAPES) ×4
DRAPE POUCH INSTRU U-SHP 10X18 (DRAPES) ×2 IMPLANT
DRAPE SURG ORHT 6 SPLT 77X108 (DRAPES) ×2 IMPLANT
DRAPE U-SHAPE 47X51 STRL (DRAPES) ×1 IMPLANT
DRESSING MEPILEX FLEX 4X4 (GAUZE/BANDAGES/DRESSINGS) ×2 IMPLANT
DRSG AQUACEL AG ADV 3.5X 6 (GAUZE/BANDAGES/DRESSINGS) ×1 IMPLANT
DRSG EMULSION OIL 3X16 NADH (GAUZE/BANDAGES/DRESSINGS) ×1 IMPLANT
DRSG MEPILEX BORDER 4X8 (GAUZE/BANDAGES/DRESSINGS) ×1 IMPLANT
DRSG MEPILEX FLEX 4X4 (GAUZE/BANDAGES/DRESSINGS)
DURAPREP 26ML APPLICATOR (WOUND CARE) ×2 IMPLANT
ELECT REM PT RETURN 15FT ADLT (MISCELLANEOUS) ×2 IMPLANT
EVACUATOR 1/8 PVC DRAIN (DRAIN) ×2 IMPLANT
FACESHIELD WRAPAROUND (MASK) ×8 IMPLANT
FACESHIELD WRAPAROUND OR TEAM (MASK) ×4 IMPLANT
GAUZE SPONGE 4X4 12PLY STRL (GAUZE/BANDAGES/DRESSINGS) ×2 IMPLANT
GLOVE SRG 8 PF TXTR STRL LF DI (GLOVE) ×1 IMPLANT
GLOVE SURG ENC MOIS LTX SZ6.5 (GLOVE) ×4 IMPLANT
GLOVE SURG ENC MOIS LTX SZ8 (GLOVE) ×4 IMPLANT
GLOVE SURG UNDER POLY LF SZ7 (GLOVE) ×4 IMPLANT
GLOVE SURG UNDER POLY LF SZ8 (GLOVE) ×2
GOWN STRL REUS W/TWL LRG LVL3 (GOWN DISPOSABLE) ×4 IMPLANT
HANDPIECE INTERPULSE COAX TIP (DISPOSABLE) ×2
HEAD FEMORAL SROM 32 PLUS 0 (Hips) IMPLANT
IMMOBILIZER KNEE 20 (SOFTGOODS) IMPLANT
IMMOBILIZER KNEE 20 THIGH 36 (SOFTGOODS) IMPLANT
KIT BASIN OR (CUSTOM PROCEDURE TRAY) ×2 IMPLANT
KIT TURNOVER KIT A (KITS) IMPLANT
LINER PINNACLE (Hips) ×1 IMPLANT
MANIFOLD NEPTUNE II (INSTRUMENTS) ×2 IMPLANT
NDL SAFETY ECLIPSE 18X1.5 (NEEDLE) ×1 IMPLANT
NEEDLE HYPO 18GX1.5 SHARP (NEEDLE) ×2
NS IRRIG 1000ML POUR BTL (IV SOLUTION) ×2 IMPLANT
PACK TOTAL JOINT (CUSTOM PROCEDURE TRAY) ×2 IMPLANT
PASSER SUT SWANSON 36MM LOOP (INSTRUMENTS) IMPLANT
PENCIL SMOKE EVACUATOR COATED (MISCELLANEOUS) ×2 IMPLANT
PROTECTOR NERVE ULNAR (MISCELLANEOUS) ×2 IMPLANT
SET HNDPC FAN SPRY TIP SCT (DISPOSABLE) IMPLANT
SPONGE T-LAP 18X18 ~~LOC~~+RFID (SPONGE) ×5 IMPLANT
SROM FEMORAL HEAD 32 PLUS 0 (Hips) ×2 IMPLANT
STAPLER VISISTAT 35W (STAPLE) IMPLANT
SUCTION FRAZIER HANDLE 12FR (TUBING) ×2
SUCTION TUBE FRAZIER 12FR DISP (TUBING) ×1 IMPLANT
SUT ETHIBOND NAB CT1 #1 30IN (SUTURE) ×4 IMPLANT
SUT MON AB-0 CT1 36 (SUTURE) ×1 IMPLANT
SUT STRATAFIX 0 PDS 27 VIOLET (SUTURE) ×2
SUT VIC AB 2-0 CT1 27 (SUTURE) ×4
SUT VIC AB 2-0 CT1 TAPERPNT 27 (SUTURE) ×3 IMPLANT
SUTURE STRATFX 0 PDS 27 VIOLET (SUTURE) ×1 IMPLANT
SWAB COLLECTION DEVICE MRSA (MISCELLANEOUS) IMPLANT
SWAB CULTURE ESWAB REG 1ML (MISCELLANEOUS) IMPLANT
SYR 50ML LL SCALE MARK (SYRINGE) ×2 IMPLANT
TOWEL OR 17X26 10 PK STRL BLUE (TOWEL DISPOSABLE) ×4 IMPLANT
TRAY FOLEY MTR SLVR 16FR STAT (SET/KITS/TRAYS/PACK) ×2 IMPLANT
WATER STERILE IRR 1000ML POUR (IV SOLUTION) ×4 IMPLANT

## 2021-04-08 NOTE — ED Triage Notes (Signed)
EMS reports from home, slip and fall in shower this morning falling on left side. Pt c/o left hip and leg pain, shortening and inward rotation noted. Pulses intact. Hx of multiple joint replacements prior. No LOC, no blood thinners, no head strike. ? ?BP 142/82 ?HR 95 ?RR 16 ?Sp02 96 RA ? ?20 RAC ?152mcgm Fentanyl enroute. ?

## 2021-04-08 NOTE — ED Provider Notes (Signed)
[facility] DEPT Provider Note   CSN: [id number] Arrival date & time: 04/08/21  1301     History  Chief Complaint  Patient presents with   Fall   Hip Pain    Karen [name] is a 88 y.o. female.   Fall  Hip Pain  Patient is a 88 year old female presented emergency room today after a fall that occurred at 10 AM this morning.  She states that she tripped and fell onto her left hip while in the bathroom drying off after a shower.  She denies any head injury loss of consciousness nausea or vomiting.  She states that she fell almost entirely onto her left hip.  She has local right shoulder pain and left ankle pain as well but no other associated injuries or pain.  She is not on any blood thinners      Home Medications Prior to Admission medications   Medication Sig Start Date End Date Taking? Authorizing Provider  acetaminophen (TYLENOL) 650 MG CR tablet Take 650 mg by mouth at bedtime as needed (arthritis pain).   Yes [provider]  amLODipine (NORVASC) 5 MG tablet Take 1 tablet (5 mg total) by mouth daily. Patient taking differently: Take 5 mg by mouth 2 (two) times daily with a meal. 12/18/13  Yes Burtis Junes, NP  atorvastatin (LIPITOR) 40 MG tablet Take 40 mg by mouth at bedtime.   Yes [provider]  Calcium Carbonate-Vitamin D 600-400 MG-UNIT tablet Take 1 tablet by mouth every morning.   Yes [provider]  Coenzyme Q10 (CO Q 10 PO) Take 300 mg by mouth every morning.   Yes [provider]  furosemide (LASIX) 20 MG tablet Take 1 tablet (20 mg total) by mouth daily. Patient taking differently: Take 20 mg by mouth every morning. 08/08/20  Yes Croitoru, Mihai, MD  glipiZIDE (GLUCOTROL XL) 5 MG 24 hr tablet Take 5 mg by mouth every morning. 01/12/21  Yes [provider]  Magnesium 250 MG TABS Take 250 mg by mouth every morning.   Yes [provider]  meloxicam (MOBIC) 7.5 MG tablet Take  7.5 mg by mouth every morning. 12/31/20  Yes [provider]  metFORMIN (GLUCOPHAGE) 1000 MG tablet Take 1 tablet (1,000 mg total) by mouth 2 (two) times daily with a meal. Hold for 2 days, restart on 10/14/2013 Patient taking differently: Take 1,000 mg by mouth 2 (two) times daily after a meal. 10/12/13  Yes Barrett, Evelene Croon, PA-C  metoprolol succinate (TOPROL-XL) 50 MG 24 hr tablet Take 50 mg by mouth every morning. Take with or immediately following a meal.   Yes [provider]  mirtazapine (REMERON) 15 MG tablet Take 15 mg by mouth at bedtime.   Yes [provider]  Multiple Vitamin (MULTIVITAMIN WITH MINERALS) TABS tablet Take 1 tablet by mouth every morning.   Yes [provider]  Polyethyl Glycol-Propyl Glycol (SYSTANE OP) Place 1 drop into both eyes daily as needed (dry eyes).   Yes [provider]  Polyethyl Glycol-Propyl Glycol (SYSTANE ULTRA) 0.4-0.3 % SOLN Place 1 drop into both eyes daily as needed (dry eyes).   Yes [provider]  potassium chloride SA (K-DUR,KLOR-CON) 20 MEQ tablet Take 1 tablet (20 mEq total) by mouth 2 (two) times daily. Patient taking differently: Take 20 mEq by mouth 2 (two) times daily after a meal. 12/18/13  Yes Burtis Junes, NP  QUEtiapine (SEROQUEL) 200 MG tablet Take 200 mg by  mouth at bedtime.   Yes [provider]  valsartan (DIOVAN) 320 MG tablet Take 1 tablet (320 mg total) by mouth daily. Patient taking differently: Take 320 mg by mouth every morning. 09/13/13  Yes Croitoru, Mihai, MD  aspirin EC 325 MG EC tablet Take 1 tablet (325 mg total) by mouth daily with breakfast for 20 days. Then take one 81 mg aspirin once a day for three weeks. Then discontinue aspirin. 04/09/21 04/29/21  Edmisten, Ok Anis, PA  HYDROcodone-acetaminophen (NORCO/VICODIN) 5-325 MG tablet Take 1-2 tablets by mouth every 6 (six) hours as needed for severe pain (pain score 4-6). 04/09/21   Edmisten, Ok Anis, PA   methocarbamol (ROBAXIN) 500 MG tablet Take 1 tablet (500 mg total) by mouth every 6 (six) hours as needed for muscle spasms. 04/09/21   Edmisten, Ok Anis, PA      Allergies    Lisinopril, Pneumococcal vaccines, Ace inhibitors, Carvedilol, Codeine, Morphine and related, and Ultram [tramadol hcl]    Review of Systems   Review of Systems  Physical Exam Updated Vital Signs BP 107/63 (BP Location: Left Arm)    Pulse 80    Temp 98.6 F (37 C) (Oral)    Resp 16    Ht 5\' 4"  (1.626 m)    Wt 63.5 kg    LMP 05/10/1998    SpO2 98%    BMI 24.03 kg/m  Physical Exam Vitals and nursing note reviewed.  Constitutional:      General: She is not in acute distress. HENT:     Head: Normocephalic and atraumatic.     Nose: Nose normal.  Eyes:     General: No scleral icterus. Cardiovascular:     Rate and Rhythm: Normal rate and regular rhythm.     Pulses: Normal pulses.     Heart sounds: Normal heart sounds.  Pulmonary:     Effort: Pulmonary effort is normal. No respiratory distress.     Breath sounds: No wheezing.  Abdominal:     Palpations: Abdomen is soft.     Tenderness: There is no abdominal tenderness.  Musculoskeletal:     Cervical back: Normal range of motion.     Right lower leg: No edema.     Left lower leg: No edema.     Comments: Left lower extremity shortened and internally rotated  Mild tenderness to palpation of right shoulder left ankle but no focal bony tenderness or focal bruising  No OTHER bony tenderness over joints or long bones of the upper and lower extremities.    No neck or back midline tenderness, step-off, deformity, or bruising. Able to turn head left and right 45 degrees without difficulty.  Full range of motion of upper and lower extremity joints shown after palpation was conducted; with 5/5 symmetrical strength in upper and lower extremities. No chest wall tenderness, no facial or cranial tenderness.   Patient has intact sensation grossly in lower and upper  extremities. Intact patellar and ankle reflexes. Patient able to ambulate without difficulty.  Radial and DP pulses palpated BL.     Skin:    General: Skin is warm and dry.     Capillary Refill: Capillary refill takes less than 2 seconds.  Neurological:     Mental Status: She is alert. Mental status is at baseline.  Psychiatric:        Mood and Affect: Mood normal.        Behavior: Behavior normal.    ED Results / Procedures / Treatments  Labs (all labs ordered are listed, but only abnormal results are displayed) Labs Reviewed  CBC WITH DIFFERENTIAL/PLATELET - Abnormal; Notable for the following components:      Result Value   WBC 12.8 (*)    RBC 3.49 (*)    Hemoglobin 11.9 (*)    HCT 35.8 (*)    MCV 102.6 (*)    MCH 34.1 (*)    Neutro Abs 10.8 (*)    All other components within normal limits  BASIC METABOLIC PANEL - Abnormal; Notable for the following components:   Sodium 134 (*)    Glucose, Bld 355 (*)    BUN 51 (*)    Creatinine, Ser 1.75 (*)    GFR, Estimated 27 (*)    All other components within normal limits  GLUCOSE, CAPILLARY - Abnormal; Notable for the following components:   Glucose-Capillary 228 (*)    All other components within normal limits  GLUCOSE, CAPILLARY - Abnormal; Notable for the following components:   Glucose-Capillary 221 (*)    All other components within normal limits  CBC - Abnormal; Notable for the following components:   RBC 2.56 (*)    Hemoglobin 8.7 (*)    HCT 26.5 (*)    MCV 103.5 (*)    Platelets 138 (*)    All other components within normal limits  BASIC METABOLIC PANEL - Abnormal; Notable for the following components:   Sodium 132 (*)    Glucose, Bld 352 (*)    BUN 48 (*)    Creatinine, Ser 1.71 (*)    Calcium 8.5 (*)    GFR, Estimated 28 (*)    All other components within normal limits  GLUCOSE, CAPILLARY - Abnormal; Notable for the following components:   Glucose-Capillary 357 (*)    All other components within normal  limits  GLUCOSE, CAPILLARY - Abnormal; Notable for the following components:   Glucose-Capillary 286 (*)    All other components within normal limits  CBG MONITORING, ED - Abnormal; Notable for the following components:   Glucose-Capillary 312 (*)    All other components within normal limits  RESP PANEL BY RT-PCR (FLU A&B, COVID) [id number]  SURGICAL PCR SCREEN  TYPE AND SCREEN    EKG None  Radiology DG Shoulder Right  Result Date: 04/08/2021 CLINICAL DATA:  Trauma, fall EXAM: RIGHT SHOULDER - 2+ VIEW COMPARISON:  None. FINDINGS: There is buckling of cortical margin in the greater tuberosity of proximal humerus. Bony spurs seen in the proximal right humerus. Degenerative changes are noted in the right [facility] joint. In the scapular Y view, there is linear smooth marginated calcification adjacent to the acromion, possibly residual from previous injury. IMPRESSION: There is buckling of cortical margin in the greater tuberosity of proximal right humerus suggesting recent or old fracture. There is no demonstrable radiolucent fracture line. If there are continued symptoms, short-term follow-up radiographic examination or CT may be considered. Electronically Signed   By: Elmer Picker M.D.   On: 04/08/2021 14:01   DG Ankle Complete Left  Result Date: 04/08/2021 CLINICAL DATA:  Trauma, fall EXAM: LEFT ANKLE COMPLETE - 3+ VIEW COMPARISON:  None. FINDINGS: No recent fracture or dislocation is seen. Small bony spurs seen in the dorsal aspect of talonavicular joint. Arterial calcifications are seen in the soft tissues. IMPRESSION: No recent fracture or dislocation is seen in the left ankle. Electronically Signed   By: Elmer Picker M.D.   On: 04/08/2021 13:57   DG Hip Unilat W or Wo Pelvis  2-3 Views Left  Result Date: 04/08/2021 CLINICAL DATA:  Trauma, fall EXAM: DG HIP (WITH OR WITHOUT PELVIS) 2-3V LEFT COMPARISON:  None. FINDINGS: There is dislocation of prosthetic left hip. There is superior and  posterior displacement of femoral head prosthesis in relation to the acetabular cup. No fracture lines are seen. There is also evidence of right hip arthroplasty and lumbar spine fusion. IMPRESSION: There is posterosuperior dislocation of left femoral head prosthesis in relation to the acetabular cup. No fracture lines are seen. Electronically Signed   By: Elmer Picker M.D.   On: 04/08/2021 14:03    Procedures Procedures    Medications Ordered in ED Medications  acetaminophen (OFIRMEV) 10 MG/ML IV (has no administration in time range)  amLODipine (NORVASC) tablet 5 mg (5 mg Oral Given 04/08/21 2213)  atorvastatin (LIPITOR) tablet 40 mg (40 mg Oral Given 04/08/21 2213)  calcium-vitamin D (OSCAL WITH D) 500-5 MG-MCG per tablet 1 tablet (1 tablet Oral Given 04/09/21 0930)  furosemide (LASIX) tablet 20 mg (has no administration in time range)  metoprolol succinate (TOPROL-XL) 24 hr tablet 50 mg (50 mg Oral Given 04/09/21 0930)  mirtazapine (REMERON) tablet 15 mg (15 mg Oral Given 04/08/21 2213)  potassium chloride SA (KLOR-CON M) CR tablet 20 mEq (20 mEq Oral Given 04/09/21 0930)  QUEtiapine (SEROQUEL) tablet 200 mg (200 mg Oral Given 04/08/21 2213)  irbesartan (AVAPRO) tablet 300 mg (has no administration in time range)  insulin aspart (novoLOG) injection 0-15 Units (10 Units Subcutaneous Given 04/09/21 0900)  docusate sodium (COLACE) capsule 100 mg (100 mg Oral Given 04/09/21 0930)  ondansetron (ZOFRAN) tablet 4 mg (has no administration in time range)    Or  ondansetron (ZOFRAN) injection 4 mg (has no administration in time range)  metoCLOPramide (REGLAN) tablet 5-10 mg (has no administration in time range)    Or  metoCLOPramide (REGLAN) injection 5-10 mg (has no administration in time range)  menthol-cetylpyridinium (CEPACOL) lozenge 3 mg (has no administration in time range)    Or  phenol (CHLORASEPTIC) mouth spray 1 spray (has no administration in time range)  aspirin EC tablet 325 mg (325 mg  Oral Given 04/09/21 0930)  0.9 %  sodium chloride infusion ( Intravenous Rate/Dose Change 04/09/21 0542)  acetaminophen (TYLENOL) tablet 325-650 mg (has no administration in time range)  HYDROcodone-acetaminophen (NORCO/VICODIN) 5-325 MG per tablet 1-2 tablet (2 tablets Oral Given 04/09/21 1006)  acetaminophen (TYLENOL) tablet 500 mg (500 mg Oral Given 04/09/21 0541)  methocarbamol (ROBAXIN) tablet 500 mg (500 mg Oral Given 04/09/21 0541)    Or  methocarbamol (ROBAXIN) 500 mg in dextrose 5 % 50 mL IVPB ( Intravenous See Alternative 04/09/21 0541)  fentaNYL (SUBLIMAZE) 50 MCG/ML injection (has no administration in time range)  sodium chloride 0.9 % bolus 500 mL (0 mLs Intravenous Stopped 04/08/21 1455)  chlorhexidine (PERIDEX) 0.12 % solution 15 mL (15 mLs Mouth/Throat Given 04/08/21 1634)  ceFAZolin (ANCEF) 2-4 GM/100ML-% IVPB (  Override pull for Anesthesia 04/08/21 1837)  acetaminophen (OFIRMEV) IV 1,000 mg (1,000 mg Intravenous Given 04/08/21 1842)  ceFAZolin (ANCEF) IVPB 2g/100 mL premix (2 g Intravenous Given 04/08/21 1835)  ceFAZolin (ANCEF) IVPB 2g/100 mL premix (2 g Intravenous New Bag/Given 04/09/21 0543)    ED Course/ Medical Decision Making/ A&P Clinical Course as of 04/09/21 1223  Wed Apr 08, 2021  1314 Fell this morning in shower (she states mechanical fall).  [WF]  1320 10am fall [WF]    Clinical Course User Index [WF] Tedd Sias, [place]  Medical Decision Making Amount and/or Complexity of Data Reviewed Labs: ordered. Radiology: ordered.  Risk Prescription drug management.   This patient presents to the ED for concern of fall, this involves a number of treatment options, and is a complaint that carries with it a high risk of complications and morbidity.  The differential diagnosis includes fractures/dislocations, hemorrhage    Co morbidities: Discussed in HPI   Brief History:  Patient is a 88 year old female presented emergency room today after a fall  that occurred at 10 AM this morning.  She states that she tripped and fell onto her left hip while in the bathroom drying off after a shower.  She denies any head injury loss of consciousness nausea or vomiting.  She states that she fell almost entirely onto her left hip.  She has local right shoulder pain and left ankle pain as well but no other associated injuries or pain.  She is not on any blood thinners  Physical exam is notable for internally rotated and shortened left lower extremity  EMR reviewed including pt PMHx, past surgical history and past visits to ER.   See HPI for more details   Lab Tests:  I ordered and independently interpreted labs.  The pertinent results include:    Labs notable for hyperglycemia.  Will hydrate and reassess.  Improved to 312   Imaging Studies:  Abnormal findings. I personally reviewed all imaging studies. Imaging notable for Dislocation of left hip patient has complicating prosthesis.  I personally reviewed x-rays of shoulder ankle and hip agree of radiology read.   Cardiac Monitoring:  NA NA   Medicines ordered:  I ordered medication including Tylenol, normal saline for hyperglycemia/dehydration with dry oral mucosa on exam. Reevaluation of the patient after these medicines showed that the patient improved I have reviewed the patients home medicines and have made adjustments as needed   Critical Interventions:     Consults:  I requested consultation with orthopedist,  and discussed lab and imaging findings as well as pertinent plan - they recommend: Admission for OR reduction    Reevaluation:  After the interventions noted above I re-evaluated patient and found that they have :improved   Social Determinants of Health:  The patient's social determinants of health were a factor in the care of this patient    Problem List / ED Course:  Hip dislocation complicated by prosthesis with ring  Discussed with orthopedist who  will admit for OR reduction   Dispostion:  After consideration of the diagnostic results and the patients response to treatment, I feel that the patent would benefit from reduction via orthopedic surgery.    Final Clinical Impression(s) / ED Diagnoses Final diagnoses:  Closed dislocation of left hip, initial encounter [facility])    Rx / DC Orders ED Discharge Orders          Ordered    aspirin EC 325 MG EC tablet  Daily with breakfast        04/09/21 0756    HYDROcodone-acetaminophen (NORCO/VICODIN) 5-325 MG tablet  Every 6 hours PRN        04/09/21 0756    methocarbamol (ROBAXIN) 500 MG tablet  Every 6 hours PRN        04/09/21 0756    Call MD / Call 911       Comments: If you experience chest pain or shortness of breath, CALL 911 and be transported to the hospital emergency room.  If you develope a fever above 101 F,  pus (white drainage) or increased drainage or redness at the wound, or calf pain, call your surgeon's office.   04/09/21 0756    Post-operative opioid taper instructions:       Comments: POST-OPERATIVE OPIOID TAPER INSTRUCTIONS: It is important to wean off of your opioid medication as soon as possible. If you do not need pain medication after your surgery it is ok to stop day one. Opioids include: Codeine, Hydrocodone(Norco, Vicodin), Oxycodone(Percocet, oxycontin) and hydromorphone amongst others.  Long term and even short term use of opiods can cause: Increased pain response Dependence Constipation Depression Respiratory depression And more.  Withdrawal symptoms can include Flu like symptoms Nausea, vomiting And more Techniques to manage these symptoms Hydrate well Eat regular healthy meals Stay active Use relaxation techniques(deep breathing, meditating, yoga) Do Not substitute Alcohol to help with tapering If you have been on opioids for less than two weeks and do not have pain than it is ok to stop all together.  Plan to wean off of opioids This plan  should start within one week post op of your joint replacement. Maintain the same interval or time between taking each dose and first decrease the dose.  Cut the total daily intake of opioids by one tablet each day Next start to increase the time between doses. The last dose that should be eliminated is the evening dose.      04/09/21 0756    Diet - low sodium heart healthy        04/09/21 0756    Constipation Prevention       Comments: Drink plenty of fluids.  Prune juice may be helpful.  You may use a stool softener, such as Colace (over the counter) 100 mg twice a day.  Use MiraLax (over the counter) for constipation as needed.   04/09/21 0756    Weight bearing as tolerated        04/09/21 0756    Driving restrictions       Comments: No driving for two weeks   04/09/21 0756    Change dressing       Comments: You have an adhesive waterproof bandage over the incision. Leave this in place until your first follow-up appointment. Once you remove this you will not need to place another bandage.   04/09/21 0756    TED hose       Comments: Use stockings (TED hose) for three weeks on both leg(s).  You may remove them at night for sleeping.   04/09/21 0756    Do not sit on low chairs, stoools or toilet seats, as it may be difficult to get up from low surfaces        04/09/21 0756    Follow the hip precautions as taught in Physical Therapy        04/09/21 [place], [place], PA 04/09/21 1401    Milton Ferguson, MD 04/11/21 [phone]

## 2021-04-08 NOTE — Anesthesia Preprocedure Evaluation (Addendum)
Anesthesia Evaluation  ?Patient identified by MRN, date of birth, ID band ?Patient awake ? ? ? ?Reviewed: ?Allergy & Precautions, NPO status , Patient's Chart, lab work & pertinent test results, reviewed documented beta blocker date and time  ? ?Airway ?Mallampati: II ? ?TM Distance: >3 FB ?Neck ROM: Full ? ? ? Dental ?no notable dental hx. ?(+) Teeth Intact, Dental Advisory Given ?  ?Pulmonary ?neg pulmonary ROS,  ?  ?Pulmonary exam normal ?breath sounds clear to auscultation ? ? ? ? ? ? Cardiovascular ?hypertension, Pt. on home beta blockers and Pt. on medications ?+ angina + CAD, + Cardiac Stents and +CHF  ?Normal cardiovascular exam ?Rhythm:Regular Rate:Normal ? ?TTE 2022 ??1. Left ventricular ejection fraction, by estimation, is 60 to 65%. The  ?left ventricle has normal function. The left ventricle has no regional  ?wall motion abnormalities. There is severe asymmetric left ventricular  ?hypertrophy of the basal-septal  ?segment. Left ventricular diastolic parameters are indeterminate.  ??2. Right ventricular systolic function is normal. The right ventricular  ?size is normal. There is normal pulmonary artery systolic pressure. The  ?estimated right ventricular systolic pressure is 83.6 mmHg.  ??3. The mitral valve is abnormal. Trivial mitral valve regurgitation.  ?Moderate mitral stenosis. Severe mitral annular calcification. MG 63mmHg at  ?HR 82 bpm, MVA 1.1 cm^2 by continuity equation  ??4. The aortic valve is tricuspid. Aortic valve regurgitation is mild.  ?Mild to moderate aortic valve sclerosis/calcification is present, without  ?any evidence of aortic stenosis.  ??5. Aortic dilatation noted. Aneurysm of the ascending aorta, measuring 45  ?mm.  ??6. The inferior vena cava is normal in size with greater than 50%  ?respiratory variability, suggesting right atrial pressure of 3 mmHg.  ?  ?Neuro/Psych ?negative neurological ROS ? negative psych ROS  ? GI/Hepatic ?negative GI  ROS, Neg liver ROS,   ?Endo/Other  ?diabetes, Type 2, Oral Hypoglycemic Agents ? Renal/GU ?Renal InsufficiencyRenal diseaseLab Results ?     Component                Value               Date                 ?     CREATININE               1.75 (H)            04/08/2021           ?     BUN                      51 (H)              04/08/2021           ?     NA                       134 (L)             04/08/2021           ?     K                        4.3                 04/08/2021           ?     CL  100                 04/08/2021           ?     CO2                      22                  04/08/2021          ;  ?negative genitourinary ?  ?Musculoskeletal ? ?(+) Arthritis , Fibromyalgia - ? Abdominal ?  ?Peds ? Hematology ?negative hematology ROS ?(+)   ?Anesthesia Other Findings ? ? Reproductive/Obstetrics ? ?  ? ? ? ? ? ? ? ? ? ? ? ? ? ?  ?  ? ? ? ? ? ? ?Anesthesia Physical ?Anesthesia Plan ? ?ASA: 3 ? ?Anesthesia Plan: General  ? ?Post-op Pain Management: Ofirmev IV (intra-op)*  ? ?Induction: Intravenous ? ?PONV Risk Score and Plan: 3 and Dexamethasone, Ondansetron and Treatment may vary due to age or medical condition ? ?Airway Management Planned: Oral ETT ? ?Additional Equipment:  ? ?Intra-op Plan:  ? ?Post-operative Plan: Extubation in OR ? ?Informed Consent: I have reviewed the patients History and Physical, chart, labs and discussed the procedure including the risks, benefits and alternatives for the proposed anesthesia with the patient or authorized representative who has indicated his/her understanding and acceptance.  ? ? ? ?Dental advisory given ? ?Plan Discussed with: CRNA ? ?Anesthesia Plan Comments: (Pt requests general anesthesia. )  ? ? ? ? ? ? ?Anesthesia Quick Evaluation ? ?

## 2021-04-08 NOTE — Transfer of Care (Signed)
Immediate Anesthesia Transfer of Care Note ? ?Patient: Karen [name] ? ?Procedure(s) Performed: TOTAL HIP REVISION (Left: Hip) ? ?Patient Location: PACU ? ?Anesthesia Type:General ? ?Level of Consciousness: awake, alert , oriented and patient cooperative ? ?Airway & Oxygen Therapy: Patient Spontanous Breathing and Patient connected to face mask oxygen ? ?Post-op Assessment: Report given to RN, Post -op Vital signs reviewed and stable and Patient moving all extremities X 4 ? ?Post vital signs: stable ? ?Last Vitals:  ?Vitals Value Taken Time  ?BP 182/85 04/08/21 2000  ?Temp 36.7 ?C 04/08/21 2000  ?Pulse 81 04/08/21 2013  ?Resp 18 04/08/21 2000  ?SpO2 100 % 04/08/21 2013  ?Vitals shown include unvalidated device data. ? ?Last Pain:  ?Vitals:  ? 04/08/21 1626  ?TempSrc: Oral  ?PainSc: 5   ?   ? ?  ? ?Complications: No notable events documented. ?

## 2021-04-08 NOTE — Anesthesia Procedure Notes (Signed)
Procedure Name: Intubation ?Date/Time: 04/08/2021 6:29 PM ?Performed by: Eben Burow, CRNA ?Pre-anesthesia Checklist: Patient identified, Emergency Drugs available, Suction available, Patient being monitored and Timeout performed ?Patient Re-evaluated:Patient Re-evaluated prior to induction ?Oxygen Delivery Method: Circle system utilized ?Preoxygenation: Pre-oxygenation with 100% oxygen ?Induction Type: IV induction ?Ventilation: Mask ventilation without difficulty ?Laryngoscope Size: Mac and 4 ?Grade View: Grade I ?Tube type: Oral ?Tube size: 7.0 mm ?Number of attempts: 1 ?Airway Equipment and Method: Stylet ?Placement Confirmation: ETT inserted through vocal cords under direct vision, positive ETCO2 and breath sounds checked- equal and bilateral ?Secured at: 21 cm ?Tube secured with: Tape ?Dental Injury: Teeth and Oropharynx as per pre-operative assessment  ? ? ? ? ?

## 2021-04-08 NOTE — H&P (Signed)
Karen [name] is an 88 y.o. female.   Chief Complaint: Left hip dislocation HPI: Karen [name] is a 88 yo female who slipped and fell in the shower this morning landing on her left hip with immediate pain and deformity. She was unable to get up and EMS was called and she was rushed to [facility], where she was diagnosed with a left hip dislocation. She has a history of a left hip replacement 17 years ago and then a revision for instability a year later with placement of a constrained liner. She has had no problems with the hip until the fall today. She did not have a loss of consciousness or hit her head. Her only complaint is the left hip pain. She presents for open reduction of the hip dislocation with possible liner revision  Past Medical History:  Diagnosis Date   Arthritis    CHF (congestive heart failure) (HCC)    Constipation    Coronary artery disease 04/2013   Three-vessel disease, initial medical therapy then stenting of the LAD and CFX 10/2013   Diabetes mellitus    TYPE 2   Fibromyalgia    H/O: gout    Heart murmur    Hx: UTI (urinary tract infection)    Hypertension     Past Surgical History:  Procedure Laterality Date   [place]  04/2013   Left main 30-40%, prox LAD 40%, mid/distal LAD 90%, CFX 70%, OM 3 90%, RCA 40-50%, PDA 20-80%, EF 50%    CHOLECYSTECTOMY  1951   Closed reduction of Left Total Hip  2008   x 3   CORONARY STENT PLACEMENT  10/11/2013   2.25 x 20 mm Promus DES to mid/distal LAD & 3.5 x 12 mm Promus DES Mid CX         DR [place]   HERNIA REPAIR  1963   JOINT REPLACEMENT  left hip-2007, left knee-2000, right knee-1997   Left Hip, Bilateral Knee replacements   KNEE SURGERY  1997/2000   LEFT HEART CATHETERIZATION WITH CORONARY ANGIOGRAM N/A 04/25/2013   Procedure: LEFT HEART CATHETERIZATION WITH CORONARY ANGIOGRAM;  Surgeon: Sanda Klein, MD;  Location: [place] [facility] CATH LAB;  Service: Cardiovascular;   Laterality: N/A;   PERCUTANEOUS CORONARY STENT INTERVENTION (PCI-S) N/A 10/11/2013   Procedure: PERCUTANEOUS CORONARY STENT INTERVENTION (PCI-S);  Surgeon: Peter M Martinique, MD;  Location: [facility] CATH LAB;  Service: Cardiovascular;  Laterality: N/A;   REPLACEMENT TOTAL HIP W/  RESURFACING IMPLANTS  [id number]   Right Foot Surgery  2004   x 2   Salivary Gland Stone Extraction     SPINAL FUSION  2006   TOTAL HIP ARTHROPLASTY Right 05/14/2020   Procedure: TOTAL HIP ARTHROPLASTY ANTERIOR APPROACH;  Surgeon: Gaynelle Arabian, MD;  Location: [facility] ORS;  Service: Orthopedics;  Laterality: Right;  139min    Family History  Problem Relation Age of Onset   Cancer Mother    Heart attack Father    Stroke Father    Heart attack Brother    Cancer Brother    Social History:  reports that she has never smoked. She has never used smokeless tobacco. She reports that she does not drink alcohol and does not use drugs.  Allergies:  Allergies  Allergen Reactions   Lisinopril Cough   Pneumococcal Vaccines Swelling and Other (See Comments)    Very bad pain and swelling in the arm of the shot, needed 2 cortisone shots.  Ace Inhibitors Cough   Carvedilol     Weakness, dizziness, upset stomach, trembling    Codeine Nausea And Vomiting   Morphine And Related Nausea And Vomiting   Ultram [Tramadol Hcl] Itching    Medications Prior to Admission  Medication Sig Dispense Refill   amLODipine (NORVASC) 5 MG tablet Take 1 tablet (5 mg total) by mouth daily. (Patient taking differently: Take 5 mg by mouth 2 (two) times daily.) 90 tablet 3   atorvastatin (LIPITOR) 40 MG tablet Take 40 mg by mouth at bedtime.     Calcium Carbonate-Vitamin D 600-400 MG-UNIT tablet Take 1 tablet by mouth daily.     Coenzyme Q10 (CO Q 10 PO) Take 300 mg by mouth daily.     furosemide (LASIX) 20 MG tablet Take 1 tablet (20 mg total) by mouth daily. 30 tablet 5   glipiZIDE (GLUCOTROL) 5 MG tablet Take 5 mg by mouth daily before breakfast.      HYDROcodone-acetaminophen (NORCO/VICODIN) 5-325 MG tablet Take 1-2 tablets by mouth every 6 (six) hours as needed for severe pain. 42 tablet 0   Magnesium 250 MG TABS Take by mouth.     metFORMIN (GLUCOPHAGE) 1000 MG tablet Take 1 tablet (1,000 mg total) by mouth 2 (two) times daily with a meal. Hold for 2 days, restart on 10/14/2013 60 tablet 0   metoprolol succinate (TOPROL-XL) 50 MG 24 hr tablet Take 50 mg by mouth daily. Take with or immediately following a meal.     mirtazapine (REMERON) 15 MG tablet Take 15 mg by mouth at bedtime.     Multiple Vitamin (MULTIVITAMIN) tablet Take 1 tablet by mouth daily.     Polyethyl Glycol-Propyl Glycol (SYSTANE OP) Place 1 drop into both eyes daily as needed (dry eyes).     potassium chloride SA (K-DUR,KLOR-CON) 20 MEQ tablet Take 1 tablet (20 mEq total) by mouth 2 (two) times daily. 180 tablet 3   QUEtiapine (SEROQUEL) 200 MG tablet Take 200 mg by mouth at bedtime.     valsartan (DIOVAN) 320 MG tablet Take 1 tablet (320 mg total) by mouth daily. 90 tablet 3   methocarbamol (ROBAXIN) 500 MG tablet Take 1 tablet (500 mg total) by mouth every 6 (six) hours as needed for muscle spasms. 40 tablet 0    Results for orders placed or performed during the hospital encounter of 04/08/21 (from the past 48 hour(s))  CBC with Differential/Platelet     Status: Abnormal   Collection Time: 04/08/21  1:19 PM  Result Value Ref Range   WBC 12.8 (H) 4.0 - 10.5 K/uL   RBC 3.49 (L) 3.87 - 5.11 MIL/uL   Hemoglobin 11.9 (L) 12.0 - 15.0 g/dL   HCT 35.8 (L) 36.0 - 46.0 %   MCV 102.6 (H) 80.0 - 100.0 fL   MCH 34.1 (H) 26.0 - 34.0 pg   MCHC 33.2 30.0 - 36.0 g/dL   RDW 12.7 11.5 - 15.5 %   Platelets 175 150 - 400 K/uL   nRBC 0.0 0.0 - 0.2 %   Neutrophils Relative % 83 %   Neutro Abs 10.8 (H) 1.7 - 7.7 K/uL   Lymphocytes Relative 8 %   Lymphs Abs 1.0 0.7 - 4.0 K/uL   Monocytes Relative 7 %   Monocytes Absolute 0.9 0.1 - 1.0 K/uL   Eosinophils Relative 1 %   Eosinophils  Absolute 0.1 0.0 - 0.5 K/uL   Basophils Relative 0 %   Basophils Absolute 0.0 0.0 - 0.1 K/uL  Immature Granulocytes 1 %   Abs Immature Granulocytes 0.06 0.00 - 0.07 K/uL    Comment: Performed at [facility], [place] [address], [place], [place] [address]  Basic metabolic panel     Status: Abnormal   Collection Time: 04/08/21  1:19 PM  Result Value Ref Range   Sodium 134 (L) 135 - 145 mmol/L   Potassium 4.3 3.5 - 5.1 mmol/L   Chloride 100 98 - 111 mmol/L   CO2 22 22 - 32 mmol/L   Glucose, Bld 355 (H) 70 - 99 mg/dL    Comment: Glucose reference range applies only to samples taken after fasting for at least 8 hours.   BUN 51 (H) 8 - 23 mg/dL   Creatinine, Ser 1.75 (H) 0.44 - 1.00 mg/dL   Calcium 10.0 8.9 - 10.3 mg/dL   GFR, Estimated 27 (L) >60 mL/min    Comment: (NOTE) Calculated using the CKD-EPI Creatinine Equation (2021)    Anion gap 12 5 - 15    Comment: Performed at [facility], [place] [address], [place], [place] [address]  POC CBG, ED     Status: Abnormal   Collection Time: 04/08/21  2:29 PM  Result Value Ref Range   Glucose-Capillary 312 (H) 70 - 99 mg/dL    Comment: Glucose reference range applies only to samples taken after fasting for at least 8 hours.  Resp Panel by RT-PCR (Flu A&B, Covid) Nasopharyngeal Swab     Status: None   Collection Time: 04/08/21  3:10 PM   Specimen: Nasopharyngeal Swab; Nasopharyngeal(NP) swabs in vial transport medium  Result Value Ref Range   SARS Coronavirus 2 by RT PCR NEGATIVE NEGATIVE    Comment: (NOTE) SARS-CoV-2 target nucleic acids are NOT DETECTED.  The SARS-CoV-2 RNA is generally detectable in upper respiratory specimens during the acute phase of infection. The lowest concentration of SARS-CoV-2 viral copies this assay can detect is 138 copies/mL. A negative result does not preclude SARS-Cov-2 infection and should not be used as the sole basis for treatment or other patient management decisions.  A negative result may occur with  improper specimen collection/handling, submission of specimen other than nasopharyngeal swab, presence of viral mutation(s) within the areas targeted by this assay, and inadequate number of viral copies(<138 copies/mL). A negative result must be combined with clinical observations, patient history, and epidemiological information. The expected result is Negative.  Fact Sheet for Patients:  EntrepreneurPulse.com.au  Fact Sheet for Healthcare Providers:  IncredibleEmployment.be  This test is no t yet approved or cleared by the [place] FDA and  has been authorized for detection and/or diagnosis of SARS-CoV-2 by FDA under an Emergency Use Authorization (EUA). This EUA will remain  in effect (meaning this test can be used) for the duration of the COVID-19 declaration under Section 564(b)(1) of the Act, 21 U.S.C.section 360bbb-3(b)(1), unless the authorization is terminated  or revoked sooner.       Influenza A by PCR NEGATIVE NEGATIVE   Influenza B by PCR NEGATIVE NEGATIVE    Comment: (NOTE) The Xpert Xpress SARS-CoV-2/FLU/RSV plus assay is intended as an aid in the diagnosis of influenza from Nasopharyngeal swab specimens and should not be used as a sole basis for treatment. Nasal washings and aspirates are unacceptable for Xpert Xpress SARS-CoV-2/FLU/RSV testing.  Fact Sheet for Patients: EntrepreneurPulse.com.au  Fact Sheet for Healthcare Providers: IncredibleEmployment.be  This test is not yet approved or cleared by the [place] FDA and has been authorized for detection and/or  diagnosis of SARS-CoV-2 by FDA under an Emergency Use Authorization (EUA). This EUA will remain in effect (meaning this test can be used) for the duration of the COVID-19 declaration under Section 564(b)(1) of the Act, 21 U.S.C. section 360bbb-3(b)(1), unless the authorization is  terminated or revoked.  Performed at [facility], [place] [address] [place], [place] [address]   Glucose, capillary     Status: Abnormal   Collection Time: 04/08/21  4:39 PM  Result Value Ref Range   Glucose-Capillary 228 (H) 70 - 99 mg/dL    Comment: Glucose reference range applies only to samples taken after fasting for at least 8 hours.  Type and screen [place]     Status: None (Preliminary result)   Collection Time: 04/08/21  4:50 PM  Result Value Ref Range   ABO/RH(D) PENDING    Antibody Screen PENDING    Sample Expiration      04/11/2021,2359 Performed at [facility], [place] [address], [place],  [address]    DG Shoulder Right  Result Date: 04/08/2021 CLINICAL DATA:  Trauma, fall EXAM: RIGHT SHOULDER - 2+ VIEW COMPARISON:  None. FINDINGS: There is buckling of cortical margin in the greater tuberosity of proximal humerus. Bony spurs seen in the proximal right humerus. Degenerative changes are noted in the right [facility] joint. In the scapular Y view, there is linear smooth marginated calcification adjacent to the acromion, possibly residual from previous injury. IMPRESSION: There is buckling of cortical margin in the greater tuberosity of proximal right humerus suggesting recent or old fracture. There is no demonstrable radiolucent fracture line. If there are continued symptoms, short-term follow-up radiographic examination or CT may be considered. Electronically Signed   By: Elmer Picker M.D.   On: 04/08/2021 14:01   DG Ankle Complete Left  Result Date: 04/08/2021 CLINICAL DATA:  Trauma, fall EXAM: LEFT ANKLE COMPLETE - 3+ VIEW COMPARISON:  None. FINDINGS: No recent fracture or dislocation is seen. Small bony spurs seen in the dorsal aspect of talonavicular joint. Arterial calcifications are seen in the soft tissues. IMPRESSION: No recent fracture or dislocation is seen in the left ankle. Electronically Signed   By:  Elmer Picker M.D.   On: 04/08/2021 13:57   DG Hip Unilat W or Wo Pelvis 2-3 Views Left  Result Date: 04/08/2021 CLINICAL DATA:  Trauma, fall EXAM: DG HIP (WITH OR WITHOUT PELVIS) 2-3V LEFT COMPARISON:  None. FINDINGS: There is dislocation of prosthetic left hip. There is superior and posterior displacement of femoral head prosthesis in relation to the acetabular cup. No fracture lines are seen. There is also evidence of right hip arthroplasty and lumbar spine fusion. IMPRESSION: There is posterosuperior dislocation of left femoral head prosthesis in relation to the acetabular cup. No fracture lines are seen. Electronically Signed   By: Elmer Picker M.D.   On: 04/08/2021 14:03    Review of Systems  Blood pressure (!) 175/86, pulse 95, temperature 98.7 F (37.1 C), temperature source Oral, resp. rate 20, height 5\' 4"  (1.626 m), weight 63.5 kg, last menstrual period 05/10/1998, SpO2 95 %. Physical Exam Physical Examination: General appearance - alert, well appearing, and in no distress Mental status - alert, oriented to person, place, and time Chest - clear to auscultation, no wheezes, rales or rhonchi, symmetric air entry Heart - normal rate, regular rhythm, normal S1, S2, no murmurs, rubs, clicks or gallops Abdomen - soft, nontender, nondistended, no masses or organomegaly Neurological - alert, oriented, normal speech,  no focal findings or movement disorder noted Left lower extremity shortened and internally rotated. EHL/FHL/TA/Gastroc intact. Sensation and pulses intact   Assessment/Plan Left hip dislocation- She has a dislocation of her constrained liner with the liner still intact. This will require open reduction with possible liner revision.Discussed in detail with the patient who elects to proceed.  Gaynelle Arabian, MD 04/08/2021, 5:35 PM

## 2021-04-08 NOTE — Brief Op Note (Signed)
04/08/2021 ? ?7:33 PM ? ?PATIENT:  Karen [name]  88 y.o. female ? ?PRE-OPERATIVE DIAGNOSIS:  LEFT HIP DISLOCATION ? ?POST-OPERATIVE DIAGNOSIS:  LEFT HIP DISLOCATION ? ?PROCEDURE:  Procedure(s): ?TOTAL HIP REVISION (Left) ? ?SURGEON:  Surgeon(s) and Role: ?   Gaynelle Arabian, MD - Primary ? ?PHYSICIAN ASSISTANT:  ? ?ASSISTANTS: Fenton Foy, PA-C  ? ?ANESTHESIA:   general ? ?EBL:  100 mL  ? ?BLOOD ADMINISTERED:none ? ?DRAINS: none  ? ?LOCAL MEDICATIONS USED:  NONE ? ?COUNTS:  YES ? ?TOURNIQUET:  * No tourniquets in log * ? ?DICTATION: .Other Dictation: Dictation Number [id number] ? ?PLAN OF CARE: Admit for overnight observation ? ?PATIENT DISPOSITION:  PACU - hemodynamically stable. ?  ? ?

## 2021-04-09 ENCOUNTER — Encounter (HOSPITAL_COMMUNITY): Payer: Self-pay | Admitting: Orthopedic Surgery

## 2021-04-09 LAB — GLUCOSE, CAPILLARY
Glucose-Capillary: 357 mg/dL — ABNORMAL HIGH (ref 70–99)
Glucose-Capillary: 286 mg/dL — ABNORMAL HIGH (ref 70–99)
Glucose-Capillary: 270 mg/dL — ABNORMAL HIGH (ref 70–99)
Glucose-Capillary: 171 mg/dL — ABNORMAL HIGH (ref 70–99)

## 2021-04-09 LAB — BASIC METABOLIC PANEL
Anion gap: 9 (ref 5–15)
BUN: 48 mg/dL — ABNORMAL HIGH (ref 8–23)
CO2: 23 mmol/L (ref 22–32)
Calcium: 8.5 mg/dL — ABNORMAL LOW (ref 8.9–10.3)
Chloride: 100 mmol/L (ref 98–111)
Creatinine, Ser: 1.71 mg/dL — ABNORMAL HIGH (ref 0.44–1.00)
GFR, Estimated: 28 mL/min — ABNORMAL LOW (ref >60)
Glucose, Bld: 352 mg/dL — ABNORMAL HIGH (ref 70–99)
Potassium: 4.8 mmol/L (ref 3.5–5.1)
Sodium: 132 mmol/L — ABNORMAL LOW (ref 135–145)

## 2021-04-09 LAB — CBC
HCT: 26.5 % — ABNORMAL LOW (ref 36.0–46.0)
Hemoglobin: 8.7 g/dL — ABNORMAL LOW (ref 12.0–15.0)
MCH: 34 pg (ref 26.0–34.0)
MCHC: 32.8 g/dL (ref 30.0–36.0)
MCV: 103.5 fL — ABNORMAL HIGH (ref 80.0–100.0)
Platelets: 138 10*3/uL — ABNORMAL LOW (ref 150–400)
RBC: 2.56 MIL/uL — ABNORMAL LOW (ref 3.87–5.11)
RDW: 12.9 % (ref 11.5–15.5)
WBC: 8.9 10*3/uL (ref 4.0–10.5)
nRBC: 0 % (ref 0.0–0.2)

## 2021-04-09 MED ORDER — ASPIRIN 325 MG PO TBEC: 325.0000 mg | DELAYED_RELEASE_TABLET | Freq: Every day | ORAL | 0 refills | Status: DC

## 2021-04-09 MED ORDER — HYDROCODONE-ACETAMINOPHEN 5-325 MG PO TABS: 1.0000 | ORAL_TABLET | Freq: Four times a day (QID) | ORAL | 0 refills | Status: DC | PRN

## 2021-04-09 MED ORDER — METHOCARBAMOL 500 MG PO TABS: 500.0000 mg | ORAL_TABLET | Freq: Four times a day (QID) | ORAL | 0 refills | Status: DC | PRN

## 2021-04-09 NOTE — Progress Notes (Signed)
Patient received 46mcg of Fentanyl. 26mcg was wasted with Enis Gash RN. Upon entering the waste 0.5 was entered (.10ml) which looks like only .70mcg was wasted. Actual waste was 82mcg (0.5 ml) of Fentanyl. ?

## 2021-04-09 NOTE — Progress Notes (Signed)
Physical Therapy Treatment ?Patient Details ?Name: Karen [name] ?MRN: [id number] ?DOB: 1928/04/01 ?Today's Date: 04/09/2021 ? ? ?History of Present Illness 88 yo female admitted 04/08/21 after fall and left THA dislocation. S/P L hip reduction with acetabular liner revision and locking ring on 3/1 /23. PMH: CHF, CAD, fibromyalgia, HTN. Bilateral THA and TKS'a. ? ?  ?PT Comments  ? ? The patient  demonstrates improved bed mobility, ambulated in hall and practiced steps with son assisting. Patient and son agreeable to DC today. PT goals are met.    ?Recommendations for follow up therapy are one component of a multi-disciplinary discharge planning process, led by the attending physician.  Recommendations may be updated based on patient status, additional functional criteria and insurance authorization. ? ?Follow Up Recommendations ? Home health PT ?  ?  ?Assistance Recommended at Discharge Frequent or constant Supervision/Assistance  ?Patient can return home with the following A little help with walking and/or transfers;A lot of help with walking and/or transfers;A little help with bathing/dressing/bathroom;Help with stairs or ramp for entrance;Assist for transportation;Assistance with cooking/housework ?  ?Equipment Recommendations ? Rolling walker (2 wheels);BSC/3in1  ?  ?Recommendations for Other Services   ? ? ?  ?Precautions / Restrictions Precautions ?Precautions: Posterior Hip ?Restrictions ?LLE Weight Bearing: Weight bearing as tolerated  ?  ? ?Mobility ? Bed Mobility ?Overal bed mobility: Needs Assistance ?Bed Mobility: Supine to Sit, Sit to Supine ?  ?  ?Supine to sit: Min guard ?Sit to supine: Min guard ?  ?General bed mobility comments: patient practiced on left side, able to manage LLE, son present ?  ? ?Transfers ?Overall transfer level: Needs assistance ?Equipment used: Rolling walker (2 wheels) ?Transfers: Sit to/from Stand ?Sit to Stand: Min guard ?  ?  ?  ?  ?  ?General transfer comment: cues for  hand  and left leg position ?  ? ?Ambulation/Gait ?Ambulation/Gait assistance: Min guard ?Gait Distance (Feet): 80 Feet ?Assistive device: Rolling walker (2 wheels) ?Gait Pattern/deviations: Step-through pattern ?Gait velocity: decr ?  ?  ?General Gait Details: cues for safety and sequence ? ? ?Stairs ?Stairs: Yes ?Stairs assistance: Min assist ?Stair Management: One rail Left, Forwards ?Number of Stairs: 2 ?General stair comments: Right HHA by son. ? ? ?Wheelchair Mobility ?  ? ?Modified Rankin (Stroke Patients Only) ?  ? ? ?  ?Balance Overall balance assessment: Needs assistance ?Sitting-balance support: Feet supported, Bilateral upper extremity supported ?Sitting balance-Leahy Scale: Fair ?  ?  ?Standing balance support: During functional activity, Bilateral upper extremity supported, Reliant on assistive device for balance ?Standing balance-Leahy Scale: Fair ?  ?  ?  ?  ?  ?  ?  ?  ?  ?  ?  ?  ?  ? ?  ?Cognition Arousal/Alertness: Awake/alert ?Behavior During Therapy: [facility] for tasks assessed/performed ?Overall Cognitive Status: Within Functional Limits for tasks assessed ?  ?  ?  ?  ?  ?  ?  ?  ?  ?  ?  ?  ?  ?  ?  ?  ?  ?  ?  ? ?  ?Exercises Total Joint Exercises ?Ankle Circles/Pumps: AROM, Both, 10 reps ?Heel Slides: AAROM, Left, 5 reps ?Hip ABduction/ADduction: AAROM, Left, 5 reps ? ?  ?General Comments   ?  ?  ? ?Pertinent Vitals/Pain Pain Assessment ?Pain Assessment: 0-10 ?Pain Score: 3  ?Pain Location: left hip ?Pain Descriptors / Indicators: Discomfort ?Pain Intervention(s): Premedicated before session  ? ? ?Home Living Family/patient expects to be  discharged to:: Private residence ?Living Arrangements: Children ?Available Help at Discharge: Family;Available PRN/intermittently ?Type of Home: House ?Home Access: Stairs to enter ?Entrance Stairs-Rails: Left ?Entrance Stairs-Number of Steps: 4 ?  ?Home Layout: One level ?Home Equipment: None ?Additional Comments: pt lives with adult son and otehr son flies  in from  [place] to stay for 1  weeks.  ?  ?Prior Function    ?  ?  ?   ? ?PT Goals (current goals can now be found in the care plan section) Acute Rehab PT Goals ?Patient Stated Goal: go home today ?PT Goal Formulation: With patient/family ?Time For Goal Achievement: 04/16/21 ?Potential to Achieve Goals: Good ?Progress towards PT goals: Progressing toward goals ? ?  ?Frequency ? ? ? 7X/week ? ? ? ?  ?PT Plan Current plan remains appropriate  ? ? ?Co-evaluation   ?  ?  ?  ?  ? ?  ?AM-PAC PT "6 Clicks" Mobility   ?Outcome Measure ? Help needed turning from your back to your side while in a flat bed without using bedrails?: A Little ?Help needed moving from lying on your back to sitting on the side of a flat bed without using bedrails?: A Little ?Help needed moving to and from a bed to a chair (including a wheelchair)?: A Little ?Help needed standing up from a chair using your arms (e.g., wheelchair or bedside chair)?: A Little ?Help needed to walk in hospital room?: A Little ?Help needed climbing 3-5 steps with a railing? : A Little ?6 Click Score: 18 ? ?  ?End of Session Equipment Utilized During Treatment: Gait belt ?Activity Tolerance: Patient tolerated treatment well ?Patient left: in chair;with call bell/phone within reach;with family/visitor present ?Nurse Communication: Mobility status ?PT Visit Diagnosis: Unsteadiness on feet (R26.81);Muscle weakness (generalized) (M62.81);Difficulty in walking, not elsewhere classified (R26.2) ?  ? ? ?Time: 3833-3832 ?PT Time Calculation (min) (ACUTE ONLY): 38 min ? ?Charges:  $Gait Training: 8-22 mins ?$Self Care/Home Management: 23-37          ?          ?Karen [name] PT ?Acute Rehabilitation Services ?Pager [phone] ?Office [phone] ? ? ? ?Karen [name] ?04/09/2021, 12:45 PM ? ?

## 2021-04-09 NOTE — Evaluation (Signed)
Physical Therapy Evaluation ?Patient Details ?Name: Karen [name] ?MRN: [id number] ?DOB: 07-09-1928 ?Today's Date: 04/09/2021 ? ?History of Present Illness ? 88 yo female admitted 04/08/21 after fall and left THA dislocation. S/P L hip reduction with acetabular liner revision and locking ring on 3/1 /23. PMH: CHF, CAD, fibromyalgia, HTN. Bilateral THA and TKS'a.  ?Clinical Impression ?  The patient required mod assistance   for bed mobility, min assist  for short ambulation. Patient 's son who will be  caregiver present. Patient will get pain meds and PT will return to practice steps next visit. Patient  reviewed posterior precautions. ? Patient did appear pale after ambulating BP 130/64, not dizzy. Pt admitted with above diagnosis.  Pt currently with functional limitations due to the deficits listed below (see PT Problem List). Pt will benefit from skilled PT to increase their independence and safety with mobility to allow discharge to the venue listed below.   ?   ? ?Recommendations for follow up therapy are one component of a multi-disciplinary discharge planning process, led by the attending physician.  Recommendations may be updated based on patient status, additional functional criteria and insurance authorization. ? ?Follow Up Recommendations Follow physician's recommendations for discharge plan and follow up therapies ? ?  ?Assistance Recommended at Discharge Frequent or constant Supervision/Assistance  ?Patient can return home with the following ? A little help with walking and/or transfers;A lot of help with walking and/or transfers;A little help with bathing/dressing/bathroom;Help with stairs or ramp for entrance;Assist for transportation;Assistance with cooking/housework ? ?  ?Equipment Recommendations Rolling walker (2 wheels);BSC/3in1  ?Recommendations for Other Services ?    ?  ?Functional Status Assessment Patient has had a recent decline in their functional status and demonstrates the ability to make  significant improvements in function in a reasonable and predictable amount of time.  ? ?  ?Precautions / Restrictions Precautions ?Precautions: Posterior Hip ?Restrictions ?LLE Weight Bearing: Weight bearing as tolerated  ? ?  ? ?Mobility ? Bed Mobility ?Overal bed mobility: Needs Assistance ?Bed Mobility: Supine to Sit ?  ?  ?Supine to sit: Min assist ?  ?  ?General bed mobility comments: to the right, support of  of the left leg, patient able to push self to sitting ?  ? ?Transfers ?Overall transfer level: Needs assistance ?Equipment used: Rolling walker (2 wheels) ?Transfers: Sit to/from Stand ?Sit to Stand: Min assist ?  ?  ?  ?  ?  ?General transfer comment: cues for  hand and left leg position ?  ? ?Ambulation/Gait ?Ambulation/Gait assistance: Min assist ?Gait Distance (Feet): 20 Feet ?Assistive device: Rolling walker (2 wheels) ?Gait Pattern/deviations: Step-to pattern, Antalgic ?Gait velocity: decr ?  ?  ?General Gait Details: cues for safety and sequence ? ?Stairs ?  ?  ?  ?  ?  ? ?Wheelchair Mobility ?  ? ?Modified Rankin (Stroke Patients Only) ?  ? ?  ? ?Balance Overall balance assessment: Needs assistance ?Sitting-balance support: Feet supported, Bilateral upper extremity supported ?Sitting balance-Leahy Scale: Fair ?  ?  ?Standing balance support: During functional activity, Bilateral upper extremity supported, Reliant on assistive device for balance ?Standing balance-Leahy Scale: Fair ?  ?  ?  ?  ?  ?  ?  ?  ?  ?  ?  ?  ?   ? ? ? ?Pertinent Vitals/Pain Pain Assessment ?Pain Assessment: 0-10 ?Pain Score: 6  ?Pain Location: left hip ?Pain Descriptors / Indicators: Aching ?Pain Intervention(s): Monitored during session, Patient requesting pain meds-RN notified,  Ice applied  ? ? ?Home Living Family/patient expects to be discharged to:: Private residence ?Living Arrangements: Children ?Available Help at Discharge: Family;Available PRN/intermittently ?Type of Home: House ?Home Access: Stairs to  enter ?Entrance Stairs-Rails: Left ?Entrance Stairs-Number of Steps: 4 ?  ?Home Layout: One level ?Home Equipment: None ?Additional Comments: pt lives with adult son and otehr son flies  in from [place] to stay for 1  weeks.  ?  ?Prior Function Prior Level of Function : Independent/Modified Independent ?  ?  ?  ?  ?  ?  ?Mobility Comments: uses a RW , does not drive ?ADLs Comments: independent ?  ? ? ?Hand Dominance  ? Dominant Hand: Right ? ?  ?Extremity/Trunk Assessment  ? Upper Extremity Assessment ?Upper Extremity Assessment: Overall WFL for tasks assessed ?  ? ?Lower Extremity Assessment ?Lower Extremity Assessment: LLE deficits/detail ?LLE Deficits / Details: requred support to move the  leg across bed ?  ? ?Cervical / Trunk Assessment ?Cervical / Trunk Assessment: Normal  ?Communication  ? Communication: HOH  ?Cognition Arousal/Alertness: Awake/alert ?Behavior During Therapy: [facility] for tasks assessed/performed ?Overall Cognitive Status: Within Functional Limits for tasks assessed ?  ?  ?  ?  ?  ?  ?  ?  ?  ?  ?  ?  ?  ?  ?  ?  ?  ?  ?  ? ?  ?General Comments   ? ?  ?Exercises Total Joint Exercises ?Ankle Circles/Pumps: AROM, Both, 10 reps ?Heel Slides: AAROM, Left, 5 reps ?Hip ABduction/ADduction: AAROM, Left, 5 reps  ? ?Assessment/Plan  ?  ?PT Assessment Patient needs continued PT services  ?PT Problem List Decreased strength;Decreased mobility;Decreased safety awareness;Decreased activity tolerance;Decreased knowledge of use of DME;Pain ? ?   ?  ?PT Treatment Interventions DME instruction;Therapeutic activities;Gait training;Therapeutic exercise;Patient/family education;Functional mobility training   ? ?PT Goals (Current goals can be found in the Care Plan section)  ?Acute Rehab PT Goals ?Patient Stated Goal: go home today ?PT Goal Formulation: With patient/family ?Time For Goal Achievement: 04/16/21 ?Potential to Achieve Goals: Good ? ?  ?Frequency 7X/week ?  ? ? ?Co-evaluation   ?  ?  ?  ?  ? ? ?  ?AM-PAC PT  "6 Clicks" Mobility  ?Outcome Measure Help needed turning from your back to your side while in a flat bed without using bedrails?: A Lot ?Help needed moving from lying on your back to sitting on the side of a flat bed without using bedrails?: A Lot ?Help needed moving to and from a bed to a chair (including a wheelchair)?: A Lot ?Help needed standing up from a chair using your arms (e.g., wheelchair or bedside chair)?: A Lot ?Help needed to walk in hospital room?: A Lot ?Help needed climbing 3-5 steps with a railing? : A Lot ?6 Click Score: 12 ? ?  ?End of Session Equipment Utilized During Treatment: Gait belt ?Activity Tolerance: Patient tolerated treatment well ?Patient left: in chair;with call bell/phone within reach;with family/visitor present ?Nurse Communication: Mobility status ?PT Visit Diagnosis: Unsteadiness on feet (R26.81);Muscle weakness (generalized) (M62.81);Difficulty in walking, not elsewhere classified (R26.2) ?  ? ?Time: 8144-8185 ?PT Time Calculation (min) (ACUTE ONLY): 36 min ? ? ?Charges:   PT Evaluation ?$PT Eval Low Complexity: 1 Low ?PT Treatments ?$Gait Training: 8-22 mins ?  ?   ? ?Tresa Endo PT ?Acute Rehabilitation Services ?Pager [phone] ?Office [phone] ? ? ? ? ?Claretha Cooper ?04/09/2021, 12:32 PM ? ?

## 2021-04-09 NOTE — Progress Notes (Signed)
? ?  Subjective: ?1 Day Post-Op Procedure(s) (LRB): ?TOTAL HIP REVISION (Left) ?Patient reports pain as mild.   ?Patient seen in rounds by Dr. Wynelle Link. ?Patient is well, and has had no acute complaints or problems. Denies chest pain or SOB. Foley catheter to be removed this AM. ?We will begin therapy today.  ? ?Objective: ?Vital signs in last 24 hours: ?Temp:  [97.6 ?F (36.4 ?C)-98.7 ?F (37.1 ?C)] 97.6 ?F (36.4 ?C) (03/02 0422) ?Pulse Rate:  [76-97] 76 (03/02 0422) ?Resp:  [16-20] 16 (03/02 0422) ?BP: (97-182)/(53-120) 97/53 (03/02 0422) ?SpO2:  [93 %-100 %] 99 % (03/02 0422) ?Weight:  [63.5 kg] 63.5 kg (03/01 1626) ? ?Intake/Output from previous day: ? ?Intake/Output Summary (Last 24 hours) at 04/09/2021 0751 ?Last data filed at 04/09/2021 0600 ?Gross per 24 hour  ?Intake 2690 ml  ?Output 1125 ml  ?Net 1565 ml  ?  ? ?Intake/Output this shift: ?No intake/output data recorded. ? ?Labs: ?Recent Labs  ?  04/08/21 ?1319 04/09/21 ?0323  ?HGB 11.9* 8.7*  ? ?Recent Labs  ?  04/08/21 ?1319 04/09/21 ?0323  ?WBC 12.8* 8.9  ?RBC 3.49* 2.56*  ?HCT 35.8* 26.5*  ?PLT 175 138*  ? ?Recent Labs  ?  04/08/21 ?1319 04/09/21 ?0323  ?NA 134* 132*  ?K 4.3 4.8  ?CL 100 100  ?CO2 22 23  ?BUN 51* 48*  ?CREATININE 1.75* 1.71*  ?GLUCOSE 355* 352*  ?CALCIUM 10.0 8.5*  ? ?No results for input(s): LABPT, INR in the last 72 hours. ? ?Exam: ?General - Patient is Alert and Oriented ?Extremity - Neurologically intact ?Neurovascular intact ?Sensation intact distally ?Dorsiflexion/Plantar flexion intact ?Dressing - dressing C/D/I ?Motor Function - intact, moving foot and toes well on exam.  ? ?Past Medical History:  ?Diagnosis Date  ? Arthritis   ? CHF (congestive heart failure) ([place])   ? Constipation   ? Coronary artery disease 04/2013  ? Three-vessel disease, initial medical therapy then stenting of the LAD and CFX 10/2013  ? Diabetes mellitus   ? TYPE 2  ? Fibromyalgia   ? H/O: gout   ? Heart murmur   ? Hx: UTI (urinary tract infection)   ? Hypertension    ? ? ?Assessment/Plan: ?1 Day Post-Op Procedure(s) (LRB): ?TOTAL HIP REVISION (Left) ?Principal Problem: ?  S/P total hip arthroplasty ?Active Problems: ?  Closed dislocation of left hip, initial encounter ([place]) ? ?Estimated body mass index is 24.03 kg/m? as calculated from the following: ?  Height as of this encounter: 5\' 4"  (1.626 m). ?  Weight as of this encounter: 63.5 kg. ?Advance diet ?Up with therapy ?D/C IV fluids ? ?DVT Prophylaxis - Aspirin ?Weight bearing as tolerated. ?Begin therapy. ? ?Plan is to go Home after hospital stay. ?Plan for discharge later today with HHPT if progresses with therapy and meeting goals. ?Follow-up in the office in 2 weeks. ? ?The PDMP database was reviewed today prior to any opioid medications being prescribed to this patient. ? ?Theresa Duty, PA-C ?Orthopedic Surgery ?[phone] ?04/09/2021, 7:51 AM ? ?

## 2021-04-09 NOTE — Progress Notes (Signed)
PA [employer] notified of patients low urine output for this shift.  Recommended patient stay the night.  Patient and her son agreed it was a good idea.  Pt encouraged to continue drinking water.   ?

## 2021-04-09 NOTE — Discharge Instructions (Signed)
Dr. Gaynelle Arabian Total Joint Specialist [facility] [address] [place], [place] [address] [phone]  POSTERIOR TOTAL HIP REPLACEMENT POSTOPERATIVE DIRECTIONS  Hip Rehabilitation, Guidelines Following Surgery  The results of a hip operation are greatly improved after range of motion and muscle strengthening exercises. Follow all safety measures which are given to protect your hip. If any of these exercises cause increased pain or swelling in your joint, decrease the amount until you are comfortable again. Then slowly increase the exercises. Call your caregiver if you have problems or questions.   PRECAUTIONS (6 WEEKS FOLLOWING SURGERY) Do not bend your hip past a 90 degree angle Do not cross your legs. Dont twist your hip inwards- keep knees and toes pointed upwards   HOME CARE INSTRUCTIONS  Remove items at home which could result in a fall. This includes throw rugs or furniture in walking pathways.  ICE to the affected hip every three hours for 30 minutes at a time and then as needed for pain and swelling.  Continue to use ice on the hip for pain and swelling from surgery. You may notice swelling that will progress down to the foot and ankle.  This is normal after surgery.  Elevate the leg when you are not up walking on it.   Continue to use the breathing machine which will help keep your temperature down.  It is common for your temperature to cycle up and down following surgery, especially at night when you are not up moving around and exerting yourself.  The breathing machine keeps your lungs expanded and your temperature down.  DIET You may resume your previous home diet once your are discharged from the hospital.  DRESSING / WOUND CARE / SHOWERING Keep the surgical dressing until follow up.  The dressing is water proof, so you can shower without any extra covering.  IF THE DRESSING FALLS OFF or the wound gets wet inside, change the dressing with sterile gauze.   Please use good hand washing techniques before changing the dressing.  Do not use any lotions or creams on the incision until instructed by your surgeon.   You may start showering once you are discharged home but do not submerge the incision under water. Just pat the incision dry and apply a dry gauze dressing on daily. Change the surgical dressing daily and reapply a dry dressing each time.  POST-OPERATIVE OPIOID TAPER INSTRUCTIONS: It is important to wean off of your opioid medication as soon as possible. If you do not need pain medication after your surgery it is ok to stop day one. Opioids include: Codeine, Hydrocodone(Norco, Vicodin), Oxycodone(Percocet, oxycontin) and hydromorphone amongst others.  Long term and even short term use of opiods can cause: Increased pain response Dependence Constipation Depression Respiratory depression And more.  Withdrawal symptoms can include Flu like symptoms Nausea, vomiting And more Techniques to manage these symptoms Hydrate well Eat regular healthy meals Stay active Use relaxation techniques(deep breathing, meditating, yoga) Do Not substitute Alcohol to help with tapering If you have been on opioids for less than two weeks and do not have pain than it is ok to stop all together.  Plan to wean off of opioids This plan should start within one week post op of your joint replacement. Maintain the same interval or time between taking each dose and first decrease the dose.  Cut the total daily intake of opioids by one tablet each day Next start to increase the time between doses. The last  dose that should be eliminated is the evening dose.    ACTIVITY Walk with your walker as instructed. Use walker as long as suggested by your caregivers. Avoid periods of inactivity such as sitting longer than an hour when not asleep. This helps prevent blood clots.  You may resume a sexual relationship in one month or when given the OK by your doctor.   You may return to work once you are cleared by your doctor.  Do not drive a car for 6 weeks or until released by you surgeon.  Do not drive while taking narcotics.  WEIGHT BEARING Weight bearing as tolerated with assist device (walker, cane, etc) as directed, use it as long as suggested by your surgeon or therapist, typically at least 4-6 weeks.  POSTOPERATIVE CONSTIPATION PROTOCOL Constipation - defined medically as fewer than three stools per week and severe constipation as less than one stool per week.  One of the most common issues patients have following surgery is constipation.  Even if you have a regular bowel pattern at home, your normal regimen is likely to be disrupted due to multiple reasons following surgery.  Combination of anesthesia, postoperative narcotics, change in appetite and fluid intake all can affect your bowels.  In order to avoid complications following surgery, here are some recommendations in order to help you during your recovery period.  Colace (docusate) - Pick up an over-the-counter form of Colace or another stool softener and take twice a day as long as you are requiring postoperative pain medications.  Take with a full glass of water daily.  If you experience loose stools or diarrhea, hold the colace until you stool forms back up.  If your symptoms do not get better within 1 week or if they get worse, check with your doctor.  Dulcolax (bisacodyl) - Pick up over-the-counter and take as directed by the product packaging as needed to assist with the movement of your bowels.  Take with a full glass of water.  Use this product as needed if not relieved by Colace only.   MiraLax (polyethylene glycol) - Pick up over-the-counter to have on hand.  MiraLax is a solution that will increase the amount of water in your bowels to assist with bowel movements.  Take as directed and can mix with a glass of water, juice, soda, coffee, or tea.  Take if you go more than two days  without a movement. Do not use MiraLax more than once per day. Call your doctor if you are still constipated or irregular after using this medication for 7 days in a row.  If you continue to have problems with postoperative constipation, please contact the office for further assistance and recommendations.  If you experience "the worst abdominal pain ever" or develop nausea or vomiting, please contact the office immediatly for further recommendations for treatment.  ITCHING  If you experience itching with your medications, try taking only a single pain pill, or even half a pain pill at a time.  You can also use Benadryl over the counter for itching or also to help with sleep.   TED HOSE STOCKINGS Wear the elastic stockings on both legs for three weeks following surgery during the day but you may remove then at night for sleeping.  MEDICATIONS See your medication summary on the After Visit Summary that the nursing staff will review with you prior to discharge.  You may have some home medications which will be placed on hold until you complete the course  of blood thinner medication.  It is important for you to complete the blood thinner medication as prescribed by your surgeon.  Continue your approved medications as instructed at time of discharge.  PRECAUTIONS If you experience chest pain or shortness of breath - call 911 immediately for transfer to the hospital emergency department.  If you develop a fever greater that 101 F, purulent drainage from wound, increased redness or drainage from wound, foul odor from the wound/dressing, or calf pain - CONTACT YOUR SURGEON.                                                   FOLLOW-UP APPOINTMENTS Make sure you keep all of your appointments after your operation with your surgeon and caregivers. You should call the office at the above phone number and make an appointment for approximately two weeks after the date of your surgery or on the date instructed  by your surgeon outlined in the "After Visit Summary".  RANGE OF MOTION AND STRENGTHENING EXERCISES  These exercises are designed to help you keep full movement of your hip joint. Follow your caregiver's or physical therapist's instructions. Perform all exercises about fifteen times, three times per day or as directed. Exercise both hips, even if you have had only one joint replacement. These exercises can be done on a training (exercise) mat, on the floor, on a table or on a bed. Use whatever works the best and is most comfortable for you. Use music or television while you are exercising so that the exercises are a pleasant break in your day. This will make your life better with the exercises acting as a break in routine you can look forward to.  Lying on your back, slowly slide your foot toward your buttocks, raising your knee up off the floor. Then slowly slide your foot back down until your leg is straight again.  Lying on your back spread your legs as far apart as you can without causing discomfort.  Lying on your side, raise your upper leg and foot straight up from the floor as far as is comfortable. Slowly lower the leg and repeat.  Lying on your back, tighten up the muscle in the front of your thigh (quadriceps muscles). You can do this by keeping your leg straight and trying to raise your heel off the floor. This helps strengthen the largest muscle supporting your knee.  Lying on your back, tighten up the muscles of your buttocks both with the legs straight and with the knee bent at a comfortable angle while keeping your heel on the floor.   IF YOU ARE TRANSFERRED TO A SKILLED REHAB FACILITY If the patient is transferred to a skilled rehab facility following release from the hospital, a list of the current medications will be sent to the facility for the patient to continue.  When discharged from the skilled rehab facility, please have the facility set up the patient's [place] prior to being released. Also, the skilled facility will be responsible for providing the patient with their medications at time of release from the facility to include their pain medication, the muscle relaxants, and their blood thinner medication. If the patient is still at the rehab facility at time of the two week follow up appointment, the skilled rehab facility will also need to assist the  patient in arranging follow up appointment in our office and any transportation needs.  MAKE SURE YOU:  Understand these instructions.  Get help right away if you are not doing well or get worse.    Pick up stool softner and laxative for home use following surgery while on pain medications. Do not submerge incision under water. Please use good hand washing techniques while changing dressing each day. May shower starting three days after surgery. Please use a clean towel to pat the incision dry following showers. Continue to use ice for pain and swelling after surgery. Do not use any lotions or creams on the incision until instructed by your surgeon.

## 2021-04-09 NOTE — Op Note (Signed)
NAME: Karen [name], Karen [name] ?MEDICAL RECORD NO: [id number] ?ACCOUNT NO: [id number] ?DATE OF BIRTH: Feb 14, 1928 ?FACILITY: [facility] ?LOCATION: [facility] ?PHYSICIAN: Dione Plover. Prudy Candy, MD ? ?Operative Report  ? ?DATE OF PROCEDURE: 04/08/2021 ? ?PREOPERATIVE DIAGNOSIS:  Left hip dislocation. ? ?POSTOPERATIVE DIAGNOSIS:  Left hip dislocation. ? ?PROCEDURE:  Left hip open reduction with acetabular liner revision. ? ?SURGEON:  Dione Plover. Candee Hoon, MD ? ?ASSISTANT:  Fenton Foy, PA-C ? ?ANESTHESIA:  General. ? ?ESTIMATED BLOOD LOSS:  100 mL. ? ?DRAINS:  None. ? ?COMPLICATIONS:  None. ? ?CONDITION:  Stable to recovery. ? ?BRIEF CLINICAL NOTE:  The patient is a 88 year old female had a left total hip done approximately 17 years ago.  She had instability and had to have a revision to a constrained liner a year later.  She did fine until this morning when she fell in the  ?shower, landing directly on her left side.  She had immediate pain and deformity and was taken to the emergency room where dislocation was noted.  Her constrained liner remained intact, but she dislocated from the liner.  She presents now for open  ?reduction, possible revision. ? ?DESCRIPTION OF PROCEDURE:  After successful administration of a general anesthetic, the patient was placed in right lateral decubitus position with the left side up and held with a hip positioner.  Left lower extremity was isolated from her perineum with ? plastic drapes and prepped and draped in the usual sterile fashion.  Her previous posterolateral incision was utilized.  Skin cut with a 10 blade.  She had a very large subcutaneous hematoma, which we evacuated.  We cut down to the fascia lata, which  ?was incised in line with the skin incision.  There is also a large hematoma present under the fascia lata.  This was evacuated and then the area is thoroughly irrigated with pulsatile lavage with normal saline.  The femoral head was resting about 2-3  ?inches above the acetabulum.  I used a bone  tamp to remove the femoral head and then the femur was retracted anteriorly to gain exposure of the acetabulum.  She broke off a large amount of the polyethylene of the posterior aspect of the acetabulum.  The  ?locking ring was intact, but the polyethylene had broken allowing her to dislocate.  I removed the locking ring with a Soil scientist.  We then used the polyethylene extraction device to extract the polyethylene liner from the acetabular shell.  The  ?shell was well fixed and in good position.  She had a 52 mm constrained liner for a 32 femoral head.  We replaced this with a new constrained liner, impacted that into the acetabulum with excellent purchase.  I then placed a new 32+0 S-ROM metal head  ?onto the femoral neck.  There is no evidence of any trunnion damage.  We reduced this into the acetabular shell and then placed a locking ring around the edge of the acetabular liner.  There was no impingement on range of motion with a very stable range  ?of motion.  We further irrigated with saline with pulsatile lavage.  The fascia lata was then closed with a running 0 Stratafix suture.  Subcutaneous was closed with interrupted 2-0 Vicryl and subcuticular running 4-0 Monocryl.  Incision was cleaned and  ?dried and a sterile dressing applied.  She was awakened and transported to recovery in stable condition. ? ?Note that a surgical assistant was of medical necessity for this procedure in order to properly reduce the hip back  into the socket and also for retraction of vital structures.  ? ? ? ?[place] ?D: 04/08/2021 7:38:35 pm T: 04/09/2021 1:38:00 am  ?JOB: [id number]/ [id number]  ?

## 2021-04-09 NOTE — TOC Transition Note (Signed)
Transition of Care (TOC) - CM/SW Discharge Note ? ?Patient Details  ?Name: Karen [name] ?MRN: [id number] ?Date of Birth: 1928/11/29 ? ?Transition of Care (TOC) CM/SW Contact:  ?Sherie Don, LCSW ?Phone Number: ?04/09/2021, 3:47 PM ? ?Clinical Narrative: Patient is expected to discharge home after working with PT. CSW met with patient and her son, Thayer Headings, to review discharge needs. Patient will need HHPT set up, as per ortho PA it was not prearranged. CSW set up [facility] with Cind[facility] through [place] and orders are in. [employer] delivered a rolling walker to patient's room this morning and patient requested a 3N1 even if she will have to private pay. CSW set up 3N1 through [place] with [employer], which will be delivered to the patient's room. CSW updated patient and son. TOC signing off. ? ?Final next level of care: [place] ?Barriers to Discharge: No Barriers Identified ? ?Patient Goals and CMS Choice ?Patient states their goals for this hospitalization and ongoing recovery are:: Discharge home with HHPT ?CMS Medicare.gov Compare Post Acute Care list provided to:: Patient ?Choice offered to / list presented to : Patient ? ?Discharge Plan and Services         ?DME Arranged: 3-N-1, Walker rolling ?DME Agency: [employer], [employer] ?Date DME Agency Contacted: 04/09/21 ?Representative spoke with at DME Agency: Wells Guiles ([employer]), Andee Poles ([employer]) ?HH Arranged: PT ?[place] Agency: [place] ?Date HH Agency Contacted: 04/09/21 ?Representative spoke with at [place]: [place] ? ?Readmission Risk Interventions ?No flowsheet data found. ? ?

## 2021-04-10 LAB — CBC
HCT: 22.6 % — ABNORMAL LOW (ref 36.0–46.0)
Hemoglobin: 7.4 g/dL — ABNORMAL LOW (ref 12.0–15.0)
MCH: 34.7 pg — ABNORMAL HIGH (ref 26.0–34.0)
MCHC: 32.7 g/dL (ref 30.0–36.0)
MCV: 106.1 fL — ABNORMAL HIGH (ref 80.0–100.0)
Platelets: 125 10*3/uL — ABNORMAL LOW (ref 150–400)
RBC: 2.13 MIL/uL — ABNORMAL LOW (ref 3.87–5.11)
RDW: 13.2 % (ref 11.5–15.5)
WBC: 6.9 10*3/uL (ref 4.0–10.5)
nRBC: 0 % (ref 0.0–0.2)

## 2021-04-10 LAB — GLUCOSE, CAPILLARY
Glucose-Capillary: 251 mg/dL — ABNORMAL HIGH (ref 70–99)
Glucose-Capillary: 260 mg/dL — ABNORMAL HIGH (ref 70–99)
Glucose-Capillary: 100 mg/dL — ABNORMAL HIGH (ref 70–99)
Glucose-Capillary: 298 mg/dL — ABNORMAL HIGH (ref 70–99)

## 2021-04-10 LAB — PREPARE RBC (CROSSMATCH)

## 2021-04-10 MED ORDER — BISACODYL 10 MG RE SUPP
10.0000 mg | Freq: Once | RECTAL | Status: AC
  Administered 2021-04-10: 10 mg via RECTAL
  Filled 2021-04-10: qty 1

## 2021-04-10 MED ORDER — SODIUM CHLORIDE 0.9% IV SOLUTION: Freq: Once | INTRAVENOUS | Status: AC

## 2021-04-10 NOTE — Plan of Care (Signed)
  Problem: Pain Managment: Goal: General experience of comfort will improve Outcome: Progressing   

## 2021-04-10 NOTE — Anesthesia Postprocedure Evaluation (Signed)
Anesthesia Post Note ? ?Patient: AMARISSA KOERNER ? ?Procedure(s) Performed: TOTAL HIP REVISION (Left: Hip) ? ?  ? ?Patient location during evaluation: PACU ?Anesthesia Type: General ?Level of consciousness: awake and alert ?Pain management: pain level controlled ?Vital Signs Assessment: post-procedure vital signs reviewed and stable ?Respiratory status: spontaneous breathing, nonlabored ventilation, respiratory function stable and patient connected to nasal cannula oxygen ?Cardiovascular status: blood pressure returned to baseline and stable ?Postop Assessment: no apparent nausea or vomiting ?Anesthetic complications: no ? ? ?No notable events documented. ? ?Last Vitals:  ?Vitals:  ? 04/10/21 0105 04/10/21 0406  ?BP: (!) 99/49 (!) 125/53  ?Pulse: 78 81  ?Resp: 16 16  ?Temp: 36.6 ?C 36.6 ?C  ?SpO2: 94% 99%  ?  ?Last Pain:  ?Vitals:  ? 04/10/21 0542  ?TempSrc:   ?PainSc: 7   ? ? ?  ?  ?  ?  ?  ?  ? ?Laini Urick L Deidrick Rainey ? ? ? ? ?

## 2021-04-10 NOTE — Progress Notes (Signed)
? ?  Subjective: ?2 Days Post-Op Procedure(s) (LRB): ?TOTAL HIP REVISION (Left) ?Patient reports pain as mild.   ?Patient seen in rounds by Dr. Wynelle Link. ?Patient stayed overnight due to low urinary output yesterday. Denies chest pain or SOB. Has concerns regarding constipation, reports she has not had a bowel movement since Tuesday. ?Plan is to go Home after hospital stay. ? ?Objective: ?Vital signs in last 24 hours: ?Temp:  [97.8 ?F (36.6 ?C)-98.9 ?F (37.2 ?C)] 97.8 ?F (36.6 ?C) (03/03 0406) ?Pulse Rate:  [75-81] 81 (03/03 0406) ?Resp:  [16] 16 (03/03 0406) ?BP: (99-130)/(49-68) 125/53 (03/03 0406) ?SpO2:  [94 %-100 %] 99 % (03/03 0406) ? ?Intake/Output from previous day: ? ?Intake/Output Summary (Last 24 hours) at 04/10/2021 0732 ?Last data filed at 04/10/2021 0214 ?Gross per 24 hour  ?Intake 2730 ml  ?Output 1425 ml  ?Net 1305 ml  ?  ?Intake/Output this shift: ?No intake/output data recorded. ? ?Labs: ?Recent Labs  ?  04/08/21 ?1319 04/09/21 ?0323 04/10/21 ?0321  ?HGB 11.9* 8.7* 7.4*  ? ?Recent Labs  ?  04/09/21 ?0323 04/10/21 ?0321  ?WBC 8.9 6.9  ?RBC 2.56* 2.13*  ?HCT 26.5* 22.6*  ?PLT 138* 125*  ? ?Recent Labs  ?  04/08/21 ?1319 04/09/21 ?0323  ?NA 134* 132*  ?K 4.3 4.8  ?CL 100 100  ?CO2 22 23  ?BUN 51* 48*  ?CREATININE 1.75* 1.71*  ?GLUCOSE 355* 352*  ?CALCIUM 10.0 8.5*  ? ?No results for input(s): LABPT, INR in the last 72 hours. ? ?Exam: ?General - Patient is Alert and Oriented ?Extremity - Neurologically intact ?Neurovascular intact ?Sensation intact distally ?Dorsiflexion/Plantar flexion intact ?Dressing/Incision - clean, dry, and intact ?Motor Function - intact, moving foot and toes well on exam.  ? ?Past Medical History:  ?Diagnosis Date  ? Arthritis   ? CHF (congestive heart failure) ([place])   ? Constipation   ? Coronary artery disease 04/2013  ? Three-vessel disease, initial medical therapy then stenting of the LAD and CFX 10/2013  ? Diabetes mellitus   ? TYPE 2  ? Fibromyalgia   ? H/O: gout   ? Heart  murmur   ? Hx: UTI (urinary tract infection)   ? Hypertension   ? ? ?Assessment/Plan: ?2 Days Post-Op Procedure(s) (LRB): ?TOTAL HIP REVISION (Left) ?Principal Problem: ?  S/P total hip arthroplasty ?Active Problems: ?  Closed dislocation of left hip, initial encounter ([place]) ? ?Estimated body mass index is 24.03 kg/m? as calculated from the following: ?  Height as of this encounter: 5\' 4"  (1.626 m). ?  Weight as of this encounter: 63.5 kg. ?Up with therapy ? ?DVT Prophylaxis - Aspirin ?Weight-bearing as tolerated ? ?Hemoglobin 7.4 from 8.7 with reduced urinary output yesterday. Will transfuse two units RBCs with post H&H. ?Dulcolax suppository ordered for constipation.  ? ?Possible discharge this afternoon if feeling well and meeting goals. Otherwise will stay until tomorrow. ? ?Theresa Duty, PA-C ?Orthopedic Surgery ?[phone] ?04/10/2021, 7:32 AM ? ?

## 2021-04-10 NOTE — Plan of Care (Signed)
?  Problem: Clinical Measurements: ?Goal: Ability to maintain clinical measurements within normal limits will improve ?04/10/2021 0113 by Abagail Kitchens, RN ?Outcome: Progressing ?04/10/2021 0113 by Abagail Kitchens, RN ?Outcome: Progressing ?04/10/2021 0112 by Abagail Kitchens, RN ?Outcome: Progressing ?  ?Problem: Activity: ?Goal: Risk for activity intolerance will decrease ?04/10/2021 0113 by Abagail Kitchens, RN ?Outcome: Progressing ?04/10/2021 0113 by Abagail Kitchens, RN ?Outcome: Progressing ?04/10/2021 0112 by Abagail Kitchens, RN ?Outcome: Progressing ?  ?Problem: Pain Managment: ?Goal: General experience of comfort will improve ?Outcome: Progressing ?  ?Problem: Safety: ?Goal: Ability to remain free from injury will improve ?Outcome: Progressing ?  ?

## 2021-04-10 NOTE — Progress Notes (Signed)
PT Cancellation Note ? ?Patient Details ?Name: Karen [name] ?MRN: [id number] ?DOB: 07/07/1928 ? ? ?Cancelled Treatment:      ? ?Am session withheld HgB 7.4 pt scheduled to receive 2 units ?Pm session (time 15:20) withheld BP 162/69 after going to [facility] with NT and has yet to start her second unit of blood. ? ?Will plan to see pt in morning.  ? ? ?Nathanial Rancher ?04/10/2021, 3:35 PM ? ? ?

## 2021-04-11 LAB — GLUCOSE, CAPILLARY
Glucose-Capillary: 309 mg/dL — ABNORMAL HIGH (ref 70–99)
Glucose-Capillary: 210 mg/dL — ABNORMAL HIGH (ref 70–99)

## 2021-04-11 LAB — HEMOGLOBIN AND HEMATOCRIT, BLOOD
HCT: 29.8 % — ABNORMAL LOW (ref 36.0–46.0)
Hemoglobin: 10.3 g/dL — ABNORMAL LOW (ref 12.0–15.0)

## 2021-04-11 MED ORDER — METHOCARBAMOL 500 MG PO TABS: 500.0000 mg | ORAL_TABLET | Freq: Four times a day (QID) | ORAL | 1 refills | Status: DC

## 2021-04-11 NOTE — Progress Notes (Signed)
Physical Therapy Treatment ?Patient Details ?Name: Karen [name] ?MRN: [id number] ?DOB: 07-29-1928 ?Today's Date: 04/11/2021 ? ? ?History of Present Illness 88 yo female admitted 04/08/21 after fall and left THA dislocation. S/P L hip reduction with acetabular liner revision and locking ring on 3/1 /23. PMH: CHF, CAD, fibromyalgia, HTN. Bilateral THA and TKS'a. ? ?  ?PT Comments  ? ? The patient is feeling well, patient ambulated and practiced steps with son assisting. Patient ready for DC.   ?Recommendations for follow up therapy are one component of a multi-disciplinary discharge planning process, led by the attending physician.  Recommendations may be updated based on patient status, additional functional criteria and insurance authorization. ? ?Follow Up Recommendations ? Home health PT ?  ?  ?Assistance Recommended at Discharge Frequent or constant Supervision/Assistance  ?Patient can return home with the following A little help with walking and/or transfers;A lot of help with walking and/or transfers;A little help with bathing/dressing/bathroom;Help with stairs or ramp for entrance;Assist for transportation;Assistance with cooking/housework ?  ?Equipment Recommendations ? Rolling walker (2 wheels);BSC/3in1  ?  ?Recommendations for Other Services   ? ? ?  ?Precautions / Restrictions Precautions ?Precautions: Posterior Hip ?Restrictions ?LLE Weight Bearing: Weight bearing as tolerated  ?  ? ?Mobility ? Bed Mobility ?  ?  ?  ?  ?Supine to sit: Min guard ?  ?  ?General bed mobility comments: patient practiced on left side, able to manage LLE, son present ?  ? ?Transfers ?Overall transfer level: Needs assistance ?Equipment used: Rolling walker (2 wheels) ?Transfers: Sit to/from Stand ?Sit to Stand: Min guard ?  ?  ?  ?  ?  ?General transfer comment: cues for  hand and left leg position ?  ? ?Ambulation/Gait ?Ambulation/Gait assistance: Min guard ?Gait Distance (Feet): 100 Feet ?Assistive device: Rolling walker (2  wheels) ?Gait Pattern/deviations: Step-through pattern ?  ?  ?  ?General Gait Details: cues for safety and sequence ? ? ?Stairs ?Stairs: Yes ?Stairs assistance: Min assist ?Stair Management: One rail Left, Forwards, With cane ?Number of Stairs: 2 ?General stair comments: son present ? ? ?Wheelchair Mobility ?  ? ?Modified Rankin (Stroke Patients Only) ?  ? ? ?  ?Balance   ?Sitting-balance support: Feet supported, Bilateral upper extremity supported ?Sitting balance-Leahy Scale: Fair ?  ?  ?Standing balance support: During functional activity, Bilateral upper extremity supported, Reliant on assistive device for balance ?Standing balance-Leahy Scale: Fair ?  ?  ?  ?  ?  ?  ?  ?  ?  ?  ?  ?  ?  ? ?  ?Cognition Arousal/Alertness: Awake/alert ?  ?  ?  ?  ?  ?  ?  ?  ?  ?  ?  ?  ?  ?  ?  ?  ?  ?  ?  ?  ?  ? ?  ?Exercises   ? ?  ?General Comments   ?  ?  ? ?Pertinent Vitals/Pain Pain Assessment ?Pain Score: 3  ?Pain Location: left hip ?Pain Descriptors / Indicators: Discomfort ?Pain Intervention(s): Monitored during session, Premedicated before session  ? ? ?Home Living   ?  ?  ?  ?  ?  ?  ?  ?  ?  ?   ?  ?Prior Function    ?  ?  ?   ? ?PT Goals (current goals can now be found in the care plan section) Progress towards PT goals: Progressing toward goals ? ?  ?Frequency ? ? ?  7X/week ? ? ? ?  ?PT Plan Current plan remains appropriate  ? ? ?Co-evaluation   ?  ?  ?  ?  ? ?  ?AM-PAC PT "6 Clicks" Mobility   ?Outcome Measure ? Help needed turning from your back to your side while in a flat bed without using bedrails?: A Little ?Help needed moving from lying on your back to sitting on the side of a flat bed without using bedrails?: A Little ?Help needed moving to and from a bed to a chair (including a wheelchair)?: A Little ?Help needed standing up from a chair using your arms (e.g., wheelchair or bedside chair)?: A Little ?Help needed to walk in hospital room?: A Little ?Help needed climbing 3-5 steps with a railing? : A  Little ?6 Click Score: 18 ? ?  ?End of Session   ?Activity Tolerance: Patient tolerated treatment well ?Patient left: in chair;with call bell/phone within reach;with family/visitor present ?Nurse Communication: Mobility status ?PT Visit Diagnosis: Unsteadiness on feet (R26.81);Muscle weakness (generalized) (M62.81);Difficulty in walking, not elsewhere classified (R26.2) ?  ? ? ?Time: 8280-0349 ?PT Time Calculation (min) (ACUTE ONLY): 26 min ? ?Charges:  $Gait Training: 23-37 mins          ?          ? ?Tresa Endo PT ?Acute Rehabilitation Services ?Pager [phone] ?Office [phone] ? ? ? ?Karen [name] ?04/11/2021, 1:28 PM ? ?

## 2021-04-11 NOTE — Progress Notes (Signed)
The patient is alert and oriented and has been seen by her physician. The orders for discharge were written. IV has been removed. Went over discharge instructions with patient and family. She is being discharged via wheelchair with all of her belongings.  

## 2021-04-11 NOTE — Plan of Care (Signed)
  Problem: Activity: Goal: Risk for activity intolerance will decrease Outcome: Progressing   Problem: Pain Managment: Goal: General experience of comfort will improve Outcome: Progressing   Problem: Safety: Goal: Ability to remain free from injury will improve Outcome: Progressing   

## 2021-04-13 LAB — BPAM RBC
Blood Product Expiration Date: 202303212359
Blood Product Expiration Date: 202303212359
ISSUE DATE / TIME: 202303031156
ISSUE DATE / TIME: 202303031803
Unit Type and Rh: 6200
Unit Type and Rh: 6200

## 2021-04-13 LAB — TYPE AND SCREEN
ABO/RH(D): A POS
Antibody Screen: NEGATIVE
Unit division: 0
Unit division: 0

## 2021-04-13 NOTE — Discharge Summary (Signed)
Patient ID: Karen [name] MRN: [id number] DOB/AGE: Apr 30, 1928 88 y.o.  Admit date: 04/08/2021 Discharge date: 04/11/2021  Primary Diagnosis: Left prosthetic hip dislocation Admission Diagnoses: Status post left total hip arthroplasty revision Past Medical History:  Diagnosis Date   Arthritis    CHF (congestive heart failure) (HCC)    Constipation    Coronary artery disease 04/2013   Three-vessel disease, initial medical therapy then stenting of the LAD and CFX 10/2013   Diabetes mellitus    TYPE 2   Fibromyalgia    H/O: gout    Heart murmur    Hx: UTI (urinary tract infection)    Hypertension    Discharge Diagnoses:   Principal Problem:   S/P total hip arthroplasty Active Problems:   Closed dislocation of left hip, initial encounter ([place])  Estimated body mass index is 24.03 kg/m as calculated from the following:   Height as of this encounter: '5\' 4"'$  (1.626 m).   Weight as of this encounter: 63.5 kg.  Procedure:  Procedure(s) (LRB): TOTAL HIP REVISION (Left)   Consults: None  HPI: Karen [name] is a 88 year old female who slipped and fell in the shower landing on her left hip causing immediate pain and deformity.  She went to the [facility] emergency room and was diagnosed with a left hip dislocation with a history of left hip replacement 17 years prior.  He did have a previous revision for instability or later with placement of a constrained liner.  She was evaluated by Dr. Wynelle Link and determined to move forward with a left total hip revision.  Patient underwent surgery on 04/09/2021 and was admitted for postoperative pain control, monitoring, and physical therapy.  Laboratory Data: Admission on 04/08/2021, Discharged on 04/11/2021  Component Date Value Ref Range Status   WBC 04/08/2021 12.8 (H)  4.0 - 10.5 K/uL Final   RBC 04/08/2021 3.49 (L)  3.87 - 5.11 MIL/uL Final   Hemoglobin 04/08/2021 11.9 (L)  12.0 - 15.0 g/dL Final   HCT 04/08/2021 35.8 (L)  36.0 - 46.0 %  Final   MCV 04/08/2021 102.6 (H)  80.0 - 100.0 fL Final   MCH 04/08/2021 34.1 (H)  26.0 - 34.0 pg Final   MCHC 04/08/2021 33.2  30.0 - 36.0 g/dL Final   RDW 04/08/2021 12.7  11.5 - 15.5 % Final   Platelets 04/08/2021 175  150 - 400 K/uL Final   nRBC 04/08/2021 0.0  0.0 - 0.2 % Final   Neutrophils Relative % 04/08/2021 83  % Final   Neutro Abs 04/08/2021 10.8 (H)  1.7 - 7.7 K/uL Final   Lymphocytes Relative 04/08/2021 8  % Final   Lymphs Abs 04/08/2021 1.0  0.7 - 4.0 K/uL Final   Monocytes Relative 04/08/2021 7  % Final   Monocytes Absolute 04/08/2021 0.9  0.1 - 1.0 K/uL Final   Eosinophils Relative 04/08/2021 1  % Final   Eosinophils Absolute 04/08/2021 0.1  0.0 - 0.5 K/uL Final   Basophils Relative 04/08/2021 0  % Final   Basophils Absolute 04/08/2021 0.0  0.0 - 0.1 K/uL Final   Immature Granulocytes 04/08/2021 1  % Final   Abs Immature Granulocytes 04/08/2021 0.06  0.00 - 0.07 K/uL Final   Performed at [facility], [place] [address], [place], [place] [address]   Sodium 04/08/2021 134 (L)  135 - 145 mmol/L Final   Potassium 04/08/2021 4.3  3.5 - 5.1 mmol/L Final   Chloride 04/08/2021 100  98 - 111 mmol/L Final  CO2 04/08/2021 22  22 - 32 mmol/L Final   Glucose, Bld 04/08/2021 355 (H)  70 - 99 mg/dL Final   Glucose reference range applies only to samples taken after fasting for at least 8 hours.   BUN 04/08/2021 51 (H)  8 - 23 mg/dL Final   Creatinine, Ser 04/08/2021 1.75 (H)  0.44 - 1.00 mg/dL Final   Calcium 04/08/2021 10.0  8.9 - 10.3 mg/dL Final   GFR, Estimated 04/08/2021 27 (L)  >60 mL/min Final   Comment: (NOTE) Calculated using the CKD-EPI Creatinine Equation (2021)    Anion gap 04/08/2021 12  5 - 15 Final   Performed at [facility], [place] [address] [place], [place] [address]   Glucose-Capillary 04/08/2021 312 (H)  70 - 99 mg/dL Final   Glucose reference range applies only to samples taken after fasting for at least 8 hours.   SARS  Coronavirus 2 by RT PCR 04/08/2021 NEGATIVE  NEGATIVE Final   Comment: (NOTE) SARS-CoV-2 target nucleic acids are NOT DETECTED.  The SARS-CoV-2 RNA is generally detectable in upper respiratory specimens during the acute phase of infection. The lowest concentration of SARS-CoV-2 viral copies this assay can detect is 138 copies/mL. A negative result does not preclude SARS-Cov-2 infection and should not be used as the sole basis for treatment or other patient management decisions. A negative result may occur with  improper specimen collection/handling, submission of specimen other than nasopharyngeal swab, presence of viral mutation(s) within the areas targeted by this assay, and inadequate number of viral copies(<138 copies/mL). A negative result must be combined with clinical observations, patient history, and epidemiological information. The expected result is Negative.  Fact Sheet for Patients:  EntrepreneurPulse.com.au  Fact Sheet for Healthcare Providers:  IncredibleEmployment.be  This test is no                          t yet approved or cleared by the [place] FDA and  has been authorized for detection and/or diagnosis of SARS-CoV-2 by FDA under an Emergency Use Authorization (EUA). This EUA will remain  in effect (meaning this test can be used) for the duration of the COVID-19 declaration under Section 564(b)(1) of the Act, 21 U.S.C.section 360bbb-3(b)(1), unless the authorization is terminated  or revoked sooner.       Influenza A by PCR 04/08/2021 NEGATIVE  NEGATIVE Final   Influenza B by PCR 04/08/2021 NEGATIVE  NEGATIVE Final   Comment: (NOTE) The Xpert Xpress SARS-CoV-2/FLU/RSV plus assay is intended as an aid in the diagnosis of influenza from Nasopharyngeal swab specimens and should not be used as a sole basis for treatment. Nasal washings and aspirates are unacceptable for Xpert Xpress SARS-CoV-2/FLU/RSV testing.  Fact  Sheet for Patients: EntrepreneurPulse.com.au  Fact Sheet for Healthcare Providers: IncredibleEmployment.be  This test is not yet approved or cleared by the [place] FDA and has been authorized for detection and/or diagnosis of SARS-CoV-2 by FDA under an Emergency Use Authorization (EUA). This EUA will remain in effect (meaning this test can be used) for the duration of the COVID-19 declaration under Section 564(b)(1) of the Act, 21 U.S.C. section 360bbb-3(b)(1), unless the authorization is terminated or revoked.  Performed at [facility], [place] [address], [place], [place] [address]    MRSA, PCR 04/08/2021 NEGATIVE  NEGATIVE Final   Staphylococcus aureus 04/08/2021 NEGATIVE  NEGATIVE Final   Comment: (NOTE) The Xpert SA Assay (FDA approved for NASAL specimens in  patients 54 years of age and older), is one component of a comprehensive surveillance program. It is not intended to diagnose infection nor to guide or monitor treatment. Performed at [facility], [place] [address], [place], [place] [address]    Glucose-Capillary 04/08/2021 228 (H)  70 - 99 mg/dL Final   Glucose reference range applies only to samples taken after fasting for at least 8 hours.   ABO/RH(D) 04/08/2021 A POS   Final   Antibody Screen 04/08/2021 NEG   Final   Sample Expiration 04/08/2021 04/11/2021,2359   Final   Unit Number 04/08/2021 [id number]   Final   Blood Component Type 04/08/2021 RED CELLS,LR   Final   Unit division 04/08/2021 00   Final   Status of Unit 04/08/2021 [facility]   Final   Transfusion Status 04/08/2021 OK TO TRANSFUSE   Final   Crossmatch Result 04/08/2021    Final                   Value:Compatible Performed at [facility], [address] [place], [place] [address]    Unit Number 04/08/2021 [id number]   Final   Blood Component Type 04/08/2021 RBC LR PHER1   Final   Unit division  04/08/2021 00   Final   Status of Unit 04/08/2021 ISSUED,FINAL   Final   Transfusion Status 04/08/2021 OK TO TRANSFUSE   Final   Crossmatch Result 04/08/2021 Compatible   Final   Glucose-Capillary 04/08/2021 221 (H)  70 - 99 mg/dL Final   Glucose reference range applies only to samples taken after fasting for at least 8 hours.   WBC 04/09/2021 8.9  4.0 - 10.5 K/uL Final   RBC 04/09/2021 2.56 (L)  3.87 - 5.11 MIL/uL Final   Hemoglobin 04/09/2021 8.7 (L)  12.0 - 15.0 g/dL Final   REPEATED TO VERIFY   HCT 04/09/2021 26.5 (L)  36.0 - 46.0 % Final   MCV 04/09/2021 103.5 (H)  80.0 - 100.0 fL Final   MCH 04/09/2021 34.0  26.0 - 34.0 pg Final   MCHC 04/09/2021 32.8  30.0 - 36.0 g/dL Final   RDW 04/09/2021 12.9  11.5 - 15.5 % Final   Platelets 04/09/2021 138 (L)  150 - 400 K/uL Final   nRBC 04/09/2021 0.0  0.0 - 0.2 % Final   Performed at [facility], [place] [address] [place], [place] [address]   Sodium 04/09/2021 132 (L)  135 - 145 mmol/L Final   Potassium 04/09/2021 4.8  3.5 - 5.1 mmol/L Final   Chloride 04/09/2021 100  98 - 111 mmol/L Final   CO2 04/09/2021 23  22 - 32 mmol/L Final   Glucose, Bld 04/09/2021 352 (H)  70 - 99 mg/dL Final   Glucose reference range applies only to samples taken after fasting for at least 8 hours.   BUN 04/09/2021 48 (H)  8 - 23 mg/dL Final   Creatinine, Ser 04/09/2021 1.71 (H)  0.44 - 1.00 mg/dL Final   Calcium 04/09/2021 8.5 (L)  8.9 - 10.3 mg/dL Final   GFR, Estimated 04/09/2021 28 (L)  >60 mL/min Final   Comment: (NOTE) Calculated using the CKD-EPI Creatinine Equation (2021)    Anion gap 04/09/2021 9  5 - 15 Final   Performed at [facility], [place] [address], [place], [place] [address]   Glucose-Capillary 04/09/2021 357 (H)  70 - 99 mg/dL Final   Glucose reference range applies only to samples taken after fasting  for at least 8 hours.   Glucose-Capillary 04/09/2021 286 (H)  70 - 99 mg/dL Final   Glucose reference  range applies only to samples taken after fasting for at least 8 hours.   WBC 04/10/2021 6.9  4.0 - 10.5 K/uL Final   RBC 04/10/2021 2.13 (L)  3.87 - 5.11 MIL/uL Final   Hemoglobin 04/10/2021 7.4 (L)  12.0 - 15.0 g/dL Final   HCT 04/10/2021 22.6 (L)  36.0 - 46.0 % Final   MCV 04/10/2021 106.1 (H)  80.0 - 100.0 fL Final   MCH 04/10/2021 34.7 (H)  26.0 - 34.0 pg Final   MCHC 04/10/2021 32.7  30.0 - 36.0 g/dL Final   RDW 04/10/2021 13.2  11.5 - 15.5 % Final   Platelets 04/10/2021 125 (L)  150 - 400 K/uL Final   Comment: Immature Platelet Fraction may be clinically indicated, consider ordering this additional test [id number]    nRBC 04/10/2021 0.0  0.0 - 0.2 % Final   Performed at [facility], [place] [address] [place], [place] [address]   Glucose-Capillary 04/09/2021 270 (H)  70 - 99 mg/dL Final   Glucose reference range applies only to samples taken after fasting for at least 8 hours.   Glucose-Capillary 04/09/2021 171 (H)  70 - 99 mg/dL Final   Glucose reference range applies only to samples taken after fasting for at least 8 hours.   Glucose-Capillary 04/10/2021 251 (H)  70 - 99 mg/dL Final   Glucose reference range applies only to samples taken after fasting for at least 8 hours.   Order Confirmation 04/10/2021    Final                   Value:ORDER PROCESSED BY BLOOD BANK Performed at [facility], [place] [address], [place], [place] [address]    ISSUE DATE / TIME 04/08/2021 [id number]   Final   Blood Product Unit Number 04/08/2021 [id number]   Final   PRODUCT CODE 04/08/2021 [id number]   Final   Unit Type and Rh 04/08/2021 6200   Final   Blood Product Expiration Date 04/08/2021 [id number]   Final   ISSUE DATE / TIME 04/08/2021 [id number]   Final   Blood Product Unit Number 04/08/2021 [id number]   Final   PRODUCT CODE 04/08/2021 [id number]   Final   Unit Type and Rh 04/08/2021 6200   Final   Blood Product Expiration Date 04/08/2021  [id number]   Final   Glucose-Capillary 04/10/2021 260 (H)  70 - 99 mg/dL Final   Glucose reference range applies only to samples taken after fasting for at least 8 hours.   Glucose-Capillary 04/10/2021 100 (H)  70 - 99 mg/dL Final   Glucose reference range applies only to samples taken after fasting for at least 8 hours.   Glucose-Capillary 04/10/2021 298 (H)  70 - 99 mg/dL Final   Glucose reference range applies only to samples taken after fasting for at least 8 hours.   Hemoglobin 04/10/2021 10.3 (L)  12.0 - 15.0 g/dL Final   POST TRANSFUSION SPECIMEN   HCT 04/10/2021 29.8 (L)  36.0 - 46.0 % Final   Performed at [facility], [place] [address], [place], [place] [address]   Glucose-Capillary 04/11/2021 309 (H)  70 - 99 mg/dL Final   Glucose reference range applies only to samples taken after fasting for at least 8 hours.   Glucose-Capillary 04/11/2021 210 (H)  70 - 99 mg/dL Final   Glucose reference range  applies only to samples taken after fasting for at least 8 hours.     X-Rays:DG Shoulder Right  Result Date: 04/08/2021 CLINICAL DATA:  Trauma, fall EXAM: RIGHT SHOULDER - 2+ VIEW COMPARISON:  None. FINDINGS: There is buckling of cortical margin in the greater tuberosity of proximal humerus. Bony spurs seen in the proximal right humerus. Degenerative changes are noted in the right [facility] joint. In the scapular Y view, there is linear smooth marginated calcification adjacent to the acromion, possibly residual from previous injury. IMPRESSION: There is buckling of cortical margin in the greater tuberosity of proximal right humerus suggesting recent or old fracture. There is no demonstrable radiolucent fracture line. If there are continued symptoms, short-term follow-up radiographic examination or CT may be considered. Electronically Signed   By: Elmer Picker M.D.   On: 04/08/2021 14:01   DG Ankle Complete Left  Result Date: 04/08/2021 CLINICAL DATA:  Trauma, fall EXAM: LEFT  ANKLE COMPLETE - 3+ VIEW COMPARISON:  None. FINDINGS: No recent fracture or dislocation is seen. Small bony spurs seen in the dorsal aspect of talonavicular joint. Arterial calcifications are seen in the soft tissues. IMPRESSION: No recent fracture or dislocation is seen in the left ankle. Electronically Signed   By: Elmer Picker M.D.   On: 04/08/2021 13:57   DG Hip Unilat W or Wo Pelvis 2-3 Views Left  Result Date: 04/08/2021 CLINICAL DATA:  Trauma, fall EXAM: DG HIP (WITH OR WITHOUT PELVIS) 2-3V LEFT COMPARISON:  None. FINDINGS: There is dislocation of prosthetic left hip. There is superior and posterior displacement of femoral head prosthesis in relation to the acetabular cup. No fracture lines are seen. There is also evidence of right hip arthroplasty and lumbar spine fusion. IMPRESSION: There is posterosuperior dislocation of left femoral head prosthesis in relation to the acetabular cup. No fracture lines are seen. Electronically Signed   By: Elmer Picker M.D.   On: 04/08/2021 14:03    EKG: Orders placed or performed in visit on 03/20/20   EKG 12-Lead     Hospital Course: Karen [name] is a 88 y.o. who was admitted to Hospital. They were brought to the operating room on 04/08/2021 and underwent Procedure(s): [place].  Patient tolerated the procedure well and was later transferred to the recovery room and then to the orthopaedic floor for postoperative care.  They were given PO and IV analgesics for pain control following their surgery.  They were given 24 hours of postoperative antibiotics of  Anti-infectives (From admission, onward)    Start     Dose/Rate Route Frequency Ordered Stop   04/09/21 0030  ceFAZolin (ANCEF) IVPB 2g/100 mL premix        2 g 200 mL/hr over 30 Minutes Intravenous Every 6 hours 04/08/21 2147 04/09/21 0613   04/08/21 1745  ceFAZolin (ANCEF) IVPB 2g/100 mL premix        2 g 200 mL/hr over 30 Minutes Intravenous  Once 04/08/21 1731 04/08/21  1845   04/08/21 1707  ceFAZolin (ANCEF) 2-4 GM/100ML-% IVPB       Note to Pharmacy: Georgena Spurling W: cabinet override      04/08/21 1707 04/08/21 1837      and started on DVT prophylaxis in the form of Aspirin.   PT and OT were ordered for total joint protocol.  Discharge planning consulted to help with postop disposition and equipment needs.  Patient had an uneventful night on the evening of surgery.  They started to get up OOB  with therapy on day one.  Patient did require a blood transfusion for decreased hemoglobin and reduced urinary output.  Continued to work with therapy into day two.  the patient had progressed with therapy and meeting their goals.   Patient was seen in rounds and was ready to go home.   Diet: Regular diet Activity:WBAT left lower extremity Follow-up:in 2 weeks Disposition - Home Discharged Condition: good   Discharge Instructions     Call MD / Call 911   Complete by: As directed    If you experience chest pain or shortness of breath, CALL 911 and be transported to the hospital emergency room.  If you develope a fever above 101 F, pus (white drainage) or increased drainage or redness at the wound, or calf pain, call your surgeon's office.   Call MD / Call 911   Complete by: As directed    If you experience chest pain or shortness of breath, CALL 911 and be transported to the hospital emergency room.  If you develope a fever above 101 F, pus (white drainage) or increased drainage or redness at the wound, or calf pain, call your surgeon's office.   Change dressing   Complete by: As directed    You have an adhesive waterproof bandage over the incision. Leave this in place until your first follow-up appointment. Once you remove this you will not need to place another bandage.   Constipation Prevention   Complete by: As directed    Drink plenty of fluids.  Prune juice may be helpful.  You may use a stool softener, such as Colace (over the counter) 100 mg twice a day.   Use MiraLax (over the counter) for constipation as needed.   Constipation Prevention   Complete by: As directed    Drink plenty of fluids.  Prune juice may be helpful.  You may use a stool softener, such as Colace (over the counter) 100 mg twice a day.  Use MiraLax (over the counter) for constipation as needed.   Diet - low sodium heart healthy   Complete by: As directed    Diet - low sodium heart healthy   Complete by: As directed    Do not sit on low chairs, stoools or toilet seats, as it may be difficult to get up from low surfaces   Complete by: As directed    Driving restrictions   Complete by: As directed    No driving for two weeks   Follow the hip precautions as taught in Physical Therapy   Complete by: As directed    Increase activity slowly as tolerated   Complete by: As directed    Post-operative opioid taper instructions:   Complete by: As directed    POST-OPERATIVE OPIOID TAPER INSTRUCTIONS: It is important to wean off of your opioid medication as soon as possible. If you do not need pain medication after your surgery it is ok to stop day one. Opioids include: Codeine, Hydrocodone(Norco, Vicodin), Oxycodone(Percocet, oxycontin) and hydromorphone amongst others.  Long term and even short term use of opiods can cause: Increased pain response Dependence Constipation Depression Respiratory depression And more.  Withdrawal symptoms can include Flu like symptoms Nausea, vomiting And more Techniques to manage these symptoms Hydrate well Eat regular healthy meals Stay active Use relaxation techniques(deep breathing, meditating, yoga) Do Not substitute Alcohol to help with tapering If you have been on opioids for less than two weeks and do not have pain than it is ok to stop  all together.  Plan to wean off of opioids This plan should start within one week post op of your joint replacement. Maintain the same interval or time between taking each dose and first decrease  the dose.  Cut the total daily intake of opioids by one tablet each day Next start to increase the time between doses. The last dose that should be eliminated is the evening dose.      Post-operative opioid taper instructions:   Complete by: As directed    POST-OPERATIVE OPIOID TAPER INSTRUCTIONS: It is important to wean off of your opioid medication as soon as possible. If you do not need pain medication after your surgery it is ok to stop day one. Opioids include: Codeine, Hydrocodone(Norco, Vicodin), Oxycodone(Percocet, oxycontin) and hydromorphone amongst others.  Long term and even short term use of opiods can cause: Increased pain response Dependence Constipation Depression Respiratory depression And more.  Withdrawal symptoms can include Flu like symptoms Nausea, vomiting And more Techniques to manage these symptoms Hydrate well Eat regular healthy meals Stay active Use relaxation techniques(deep breathing, meditating, yoga) Do Not substitute Alcohol to help with tapering If you have been on opioids for less than two weeks and do not have pain than it is ok to stop all together.  Plan to wean off of opioids This plan should start within one week post op of your joint replacement. Maintain the same interval or time between taking each dose and first decrease the dose.  Cut the total daily intake of opioids by one tablet each day Next start to increase the time between doses. The last dose that should be eliminated is the evening dose.      TED hose   Complete by: As directed    Use stockings (TED hose) for three weeks on both leg(s).  You may remove them at night for sleeping.   Weight bearing as tolerated   Complete by: As directed       Allergies as of 04/11/2021       Reactions   Lisinopril Cough   Pneumococcal Vaccines Swelling, Other (See Comments)   Very bad pain and swelling in the arm of the shot, needed 2 cortisone shots.   Ace Inhibitors Cough    Carvedilol Other (See Comments)   Weakness, dizziness, upset stomach, trembling    Codeine Nausea And Vomiting   Morphine And Related Nausea And Vomiting   Ultram [tramadol Hcl] Itching        Medication List     STOP taking these medications    meloxicam 7.5 MG tablet Commonly known as: MOBIC       TAKE these medications    acetaminophen 650 MG CR tablet Commonly known as: TYLENOL Take 650 mg by mouth at bedtime as needed (arthritis pain).   amLODipine 5 MG tablet Commonly known as: NORVASC Take 1 tablet (5 mg total) by mouth daily. What changed: when to take this   aspirin 325 MG EC tablet Take 1 tablet (325 mg total) by mouth daily with breakfast for 20 days. Then take one 81 mg aspirin once a day for three weeks. Then discontinue aspirin.   atorvastatin 40 MG tablet Commonly known as: LIPITOR Take 40 mg by mouth at bedtime.   Calcium Carbonate-Vitamin D 600-400 MG-UNIT tablet Take 1 tablet by mouth every morning.   CO Q 10 PO Take 300 mg by mouth every morning.   furosemide 20 MG tablet Commonly known as: LASIX Take 1 tablet (20 mg  total) by mouth daily. What changed: when to take this   glipiZIDE 5 MG 24 hr tablet Commonly known as: GLUCOTROL XL Take 5 mg by mouth every morning.   HYDROcodone-acetaminophen 5-325 MG tablet Commonly known as: NORCO/VICODIN Take 1-2 tablets by mouth every 6 (six) hours as needed for severe pain (pain score 4-6). What changed: reasons to take this   Magnesium 250 MG Tabs Take 250 mg by mouth every morning.   metFORMIN 1000 MG tablet Commonly known as: GLUCOPHAGE Take 1 tablet (1,000 mg total) by mouth 2 (two) times daily with a meal. Hold for 2 days, restart on 10/14/2013 What changed:  when to take this additional instructions   methocarbamol 500 MG tablet Commonly known as: ROBAXIN Take 1 tablet (500 mg total) by mouth every 6 (six) hours as needed for muscle spasms. What changed: Another medication with the  same name was added. Make sure you understand how and when to take each.   methocarbamol 500 MG tablet Commonly known as: Robaxin Take 1 tablet (500 mg total) by mouth 4 (four) times daily. What changed: You were already taking a medication with the same name, and this prescription was added. Make sure you understand how and when to take each.   metoprolol succinate 50 MG 24 hr tablet Commonly known as: TOPROL-XL Take 50 mg by mouth every morning. Take with or immediately following a meal.   mirtazapine 15 MG tablet Commonly known as: REMERON Take 15 mg by mouth at bedtime.   multivitamin with minerals Tabs tablet Take 1 tablet by mouth every morning.   potassium chloride SA 20 MEQ tablet Commonly known as: KLOR-CON M Take 1 tablet (20 mEq total) by mouth 2 (two) times daily. What changed: when to take this   QUEtiapine 200 MG tablet Commonly known as: SEROQUEL Take 200 mg by mouth at bedtime.   SYSTANE OP Place 1 drop into both eyes daily as needed (dry eyes).   Systane Ultra 0.4-0.3 % Soln Generic drug: Polyethyl Glycol-Propyl Glycol Place 1 drop into both eyes daily as needed (dry eyes).   valsartan 320 MG tablet Commonly known as: DIOVAN Take 1 tablet (320 mg total) by mouth daily. What changed: when to take this               Discharge Care Instructions  (From admission, onward)           Start     Ordered   04/09/21 0000  Weight bearing as tolerated        04/09/21 0756   04/09/21 0000  Change dressing       Comments: You have an adhesive waterproof bandage over the incision. Leave this in place until your first follow-up appointment. Once you remove this you will not need to place another bandage.   04/09/21 0756            Follow-up Information     Gaynelle Arabian, MD. Schedule an appointment as soon as possible for a visit in 2 week(s).   Specialty: Orthopedic Surgery Contact information: [address] [place]  [address] [phone]         Care, [facility] Follow up.   Specialty: Home Health Services Why: Alvis Lemmings will provide PT in the home. Contact information: [place] [address] [phone]                 Signed: Jonelle Sidle PA-C Orthopaedic Surgery 04/13/2021, 3:48 PM

## 2021-04-14 DIAGNOSIS — I2510 Atherosclerotic heart disease of native coronary artery without angina pectoris: Secondary | ICD-10-CM | POA: Diagnosis not present

## 2021-04-14 DIAGNOSIS — Z96653 Presence of artificial knee joint, bilateral: Secondary | ICD-10-CM | POA: Diagnosis not present

## 2021-04-14 DIAGNOSIS — Z9181 History of falling: Secondary | ICD-10-CM | POA: Diagnosis not present

## 2021-04-14 DIAGNOSIS — I509 Heart failure, unspecified: Secondary | ICD-10-CM | POA: Diagnosis not present

## 2021-04-14 DIAGNOSIS — M797 Fibromyalgia: Secondary | ICD-10-CM | POA: Diagnosis not present

## 2021-04-14 DIAGNOSIS — R011 Cardiac murmur, unspecified: Secondary | ICD-10-CM | POA: Diagnosis not present

## 2021-04-14 DIAGNOSIS — Z7984 Long term (current) use of oral hypoglycemic drugs: Secondary | ICD-10-CM | POA: Diagnosis not present

## 2021-04-14 DIAGNOSIS — Z87440 Personal history of urinary (tract) infections: Secondary | ICD-10-CM | POA: Diagnosis not present

## 2021-04-14 DIAGNOSIS — Z8719 Personal history of other diseases of the digestive system: Secondary | ICD-10-CM | POA: Diagnosis not present

## 2021-04-14 DIAGNOSIS — K5900 Constipation, unspecified: Secondary | ICD-10-CM | POA: Diagnosis not present

## 2021-04-14 DIAGNOSIS — T84021D Dislocation of internal left hip prosthesis, subsequent encounter: Secondary | ICD-10-CM | POA: Diagnosis not present

## 2021-04-14 DIAGNOSIS — W19XXXD Unspecified fall, subsequent encounter: Secondary | ICD-10-CM | POA: Diagnosis not present

## 2021-04-14 DIAGNOSIS — I110 Hypertensive heart disease with heart failure: Secondary | ICD-10-CM | POA: Diagnosis not present

## 2021-04-14 DIAGNOSIS — E119 Type 2 diabetes mellitus without complications: Secondary | ICD-10-CM | POA: Diagnosis not present

## 2021-04-14 DIAGNOSIS — Z7982 Long term (current) use of aspirin: Secondary | ICD-10-CM | POA: Diagnosis not present

## 2021-04-14 DIAGNOSIS — M109 Gout, unspecified: Secondary | ICD-10-CM | POA: Diagnosis not present

## 2021-04-14 DIAGNOSIS — Z981 Arthrodesis status: Secondary | ICD-10-CM | POA: Diagnosis not present

## 2021-04-14 DIAGNOSIS — Z955 Presence of coronary angioplasty implant and graft: Secondary | ICD-10-CM | POA: Diagnosis not present

## 2021-04-14 DIAGNOSIS — M1990 Unspecified osteoarthritis, unspecified site: Secondary | ICD-10-CM | POA: Diagnosis not present

## 2021-04-17 DIAGNOSIS — E119 Type 2 diabetes mellitus without complications: Secondary | ICD-10-CM | POA: Diagnosis not present

## 2021-04-17 DIAGNOSIS — I110 Hypertensive heart disease with heart failure: Secondary | ICD-10-CM | POA: Diagnosis not present

## 2021-04-17 DIAGNOSIS — I2510 Atherosclerotic heart disease of native coronary artery without angina pectoris: Secondary | ICD-10-CM | POA: Diagnosis not present

## 2021-04-17 DIAGNOSIS — M797 Fibromyalgia: Secondary | ICD-10-CM | POA: Diagnosis not present

## 2021-04-17 DIAGNOSIS — T84021D Dislocation of internal left hip prosthesis, subsequent encounter: Secondary | ICD-10-CM | POA: Diagnosis not present

## 2021-04-17 DIAGNOSIS — I509 Heart failure, unspecified: Secondary | ICD-10-CM | POA: Diagnosis not present

## 2021-04-20 DIAGNOSIS — I509 Heart failure, unspecified: Secondary | ICD-10-CM | POA: Diagnosis not present

## 2021-04-20 DIAGNOSIS — I110 Hypertensive heart disease with heart failure: Secondary | ICD-10-CM | POA: Diagnosis not present

## 2021-04-20 DIAGNOSIS — T84021D Dislocation of internal left hip prosthesis, subsequent encounter: Secondary | ICD-10-CM | POA: Diagnosis not present

## 2021-04-20 DIAGNOSIS — I2510 Atherosclerotic heart disease of native coronary artery without angina pectoris: Secondary | ICD-10-CM | POA: Diagnosis not present

## 2021-04-20 DIAGNOSIS — M797 Fibromyalgia: Secondary | ICD-10-CM | POA: Diagnosis not present

## 2021-04-20 DIAGNOSIS — E119 Type 2 diabetes mellitus without complications: Secondary | ICD-10-CM | POA: Diagnosis not present

## 2021-04-21 DIAGNOSIS — Z4789 Encounter for other orthopedic aftercare: Secondary | ICD-10-CM | POA: Diagnosis not present

## 2021-04-21 DIAGNOSIS — Z96642 Presence of left artificial hip joint: Secondary | ICD-10-CM | POA: Diagnosis not present

## 2021-04-21 DIAGNOSIS — Z471 Aftercare following joint replacement surgery: Secondary | ICD-10-CM | POA: Diagnosis not present

## 2021-04-22 DIAGNOSIS — I509 Heart failure, unspecified: Secondary | ICD-10-CM | POA: Diagnosis not present

## 2021-04-22 DIAGNOSIS — T84021D Dislocation of internal left hip prosthesis, subsequent encounter: Secondary | ICD-10-CM | POA: Diagnosis not present

## 2021-04-22 DIAGNOSIS — I2510 Atherosclerotic heart disease of native coronary artery without angina pectoris: Secondary | ICD-10-CM | POA: Diagnosis not present

## 2021-04-22 DIAGNOSIS — M797 Fibromyalgia: Secondary | ICD-10-CM | POA: Diagnosis not present

## 2021-04-22 DIAGNOSIS — I110 Hypertensive heart disease with heart failure: Secondary | ICD-10-CM | POA: Diagnosis not present

## 2021-04-22 DIAGNOSIS — E119 Type 2 diabetes mellitus without complications: Secondary | ICD-10-CM | POA: Diagnosis not present

## 2021-04-24 DIAGNOSIS — I2510 Atherosclerotic heart disease of native coronary artery without angina pectoris: Secondary | ICD-10-CM | POA: Diagnosis not present

## 2021-04-24 DIAGNOSIS — T84021D Dislocation of internal left hip prosthesis, subsequent encounter: Secondary | ICD-10-CM | POA: Diagnosis not present

## 2021-04-24 DIAGNOSIS — I110 Hypertensive heart disease with heart failure: Secondary | ICD-10-CM | POA: Diagnosis not present

## 2021-04-24 DIAGNOSIS — I509 Heart failure, unspecified: Secondary | ICD-10-CM | POA: Diagnosis not present

## 2021-04-24 DIAGNOSIS — E119 Type 2 diabetes mellitus without complications: Secondary | ICD-10-CM | POA: Diagnosis not present

## 2021-04-24 DIAGNOSIS — M797 Fibromyalgia: Secondary | ICD-10-CM | POA: Diagnosis not present

## 2021-04-25 ENCOUNTER — Encounter (HOSPITAL_COMMUNITY): Payer: Self-pay

## 2021-04-25 ENCOUNTER — Emergency Department (HOSPITAL_COMMUNITY): Payer: Medicare Other

## 2021-04-25 ENCOUNTER — Other Ambulatory Visit: Payer: Self-pay

## 2021-04-25 ENCOUNTER — Inpatient Hospital Stay (HOSPITAL_COMMUNITY)
Admission: EM | Admit: 2021-04-25 | Discharge: 2021-05-01 | DRG: 981 | Disposition: A | Discharge status: Home Health | Payer: Medicare Other | Attending: Internal Medicine | Admitting: Internal Medicine

## 2021-04-25 DIAGNOSIS — I2510 Atherosclerotic heart disease of native coronary artery without angina pectoris: Secondary | ICD-10-CM | POA: Diagnosis not present

## 2021-04-25 DIAGNOSIS — R778 Other specified abnormalities of plasma proteins: Secondary | ICD-10-CM | POA: Diagnosis not present

## 2021-04-25 DIAGNOSIS — E1165 Type 2 diabetes mellitus with hyperglycemia: Secondary | ICD-10-CM | POA: Diagnosis present

## 2021-04-25 DIAGNOSIS — K611 Rectal abscess: Secondary | ICD-10-CM | POA: Diagnosis present

## 2021-04-25 DIAGNOSIS — E1169 Type 2 diabetes mellitus with other specified complication: Secondary | ICD-10-CM | POA: Diagnosis present

## 2021-04-25 DIAGNOSIS — Z471 Aftercare following joint replacement surgery: Secondary | ICD-10-CM | POA: Diagnosis not present

## 2021-04-25 DIAGNOSIS — Z7984 Long term (current) use of oral hypoglycemic drugs: Secondary | ICD-10-CM

## 2021-04-25 DIAGNOSIS — J441 Chronic obstructive pulmonary disease with (acute) exacerbation: Secondary | ICD-10-CM | POA: Diagnosis present

## 2021-04-25 DIAGNOSIS — D539 Nutritional anemia, unspecified: Secondary | ICD-10-CM | POA: Diagnosis present

## 2021-04-25 DIAGNOSIS — Z79899 Other long term (current) drug therapy: Secondary | ICD-10-CM | POA: Diagnosis not present

## 2021-04-25 DIAGNOSIS — S7002XA Contusion of left hip, initial encounter: Secondary | ICD-10-CM | POA: Diagnosis present

## 2021-04-25 DIAGNOSIS — I5042 Chronic combined systolic (congestive) and diastolic (congestive) heart failure: Secondary | ICD-10-CM | POA: Diagnosis present

## 2021-04-25 DIAGNOSIS — Z823 Family history of stroke: Secondary | ICD-10-CM

## 2021-04-25 DIAGNOSIS — Z96642 Presence of left artificial hip joint: Secondary | ICD-10-CM | POA: Diagnosis not present

## 2021-04-25 DIAGNOSIS — Z8249 Family history of ischemic heart disease and other diseases of the circulatory system: Secondary | ICD-10-CM | POA: Diagnosis not present

## 2021-04-25 DIAGNOSIS — J9601 Acute respiratory failure with hypoxia: Principal | ICD-10-CM | POA: Diagnosis present

## 2021-04-25 DIAGNOSIS — N1832 Chronic kidney disease, stage 3b: Secondary | ICD-10-CM | POA: Diagnosis present

## 2021-04-25 DIAGNOSIS — Z96653 Presence of artificial knee joint, bilateral: Secondary | ICD-10-CM | POA: Diagnosis present

## 2021-04-25 DIAGNOSIS — Z955 Presence of coronary angioplasty implant and graft: Secondary | ICD-10-CM | POA: Diagnosis not present

## 2021-04-25 DIAGNOSIS — R531 Weakness: Secondary | ICD-10-CM | POA: Diagnosis not present

## 2021-04-25 DIAGNOSIS — I959 Hypotension, unspecified: Secondary | ICD-10-CM | POA: Diagnosis not present

## 2021-04-25 DIAGNOSIS — M109 Gout, unspecified: Secondary | ICD-10-CM | POA: Diagnosis present

## 2021-04-25 DIAGNOSIS — R739 Hyperglycemia, unspecified: Secondary | ICD-10-CM | POA: Diagnosis not present

## 2021-04-25 DIAGNOSIS — R918 Other nonspecific abnormal finding of lung field: Secondary | ICD-10-CM | POA: Diagnosis not present

## 2021-04-25 DIAGNOSIS — M797 Fibromyalgia: Secondary | ICD-10-CM | POA: Diagnosis present

## 2021-04-25 DIAGNOSIS — Z96641 Presence of right artificial hip joint: Secondary | ICD-10-CM | POA: Diagnosis present

## 2021-04-25 DIAGNOSIS — I5032 Chronic diastolic (congestive) heart failure: Secondary | ICD-10-CM | POA: Diagnosis not present

## 2021-04-25 DIAGNOSIS — Z981 Arthrodesis status: Secondary | ICD-10-CM | POA: Diagnosis not present

## 2021-04-25 DIAGNOSIS — I700 Atherosclerosis of aorta: Secondary | ICD-10-CM | POA: Diagnosis not present

## 2021-04-25 DIAGNOSIS — R609 Edema, unspecified: Secondary | ICD-10-CM | POA: Diagnosis not present

## 2021-04-25 DIAGNOSIS — R600 Localized edema: Secondary | ICD-10-CM | POA: Diagnosis not present

## 2021-04-25 DIAGNOSIS — Z809 Family history of malignant neoplasm, unspecified: Secondary | ICD-10-CM

## 2021-04-25 DIAGNOSIS — R931 Abnormal findings on diagnostic imaging of heart and coronary circulation: Secondary | ICD-10-CM | POA: Diagnosis not present

## 2021-04-25 DIAGNOSIS — D509 Iron deficiency anemia, unspecified: Secondary | ICD-10-CM | POA: Diagnosis present

## 2021-04-25 DIAGNOSIS — R0609 Other forms of dyspnea: Secondary | ICD-10-CM | POA: Diagnosis not present

## 2021-04-25 DIAGNOSIS — E1122 Type 2 diabetes mellitus with diabetic chronic kidney disease: Secondary | ICD-10-CM | POA: Diagnosis present

## 2021-04-25 DIAGNOSIS — N183 Chronic kidney disease, stage 3 unspecified: Secondary | ICD-10-CM | POA: Diagnosis present

## 2021-04-25 DIAGNOSIS — R7989 Other specified abnormal findings of blood chemistry: Secondary | ICD-10-CM | POA: Diagnosis not present

## 2021-04-25 DIAGNOSIS — R0602 Shortness of breath: Secondary | ICD-10-CM | POA: Diagnosis not present

## 2021-04-25 DIAGNOSIS — S79912A Unspecified injury of left hip, initial encounter: Secondary | ICD-10-CM | POA: Diagnosis not present

## 2021-04-25 DIAGNOSIS — I77810 Thoracic aortic ectasia: Secondary | ICD-10-CM | POA: Diagnosis not present

## 2021-04-25 DIAGNOSIS — N1831 Chronic kidney disease, stage 3a: Secondary | ICD-10-CM | POA: Diagnosis not present

## 2021-04-25 DIAGNOSIS — I10 Essential (primary) hypertension: Secondary | ICD-10-CM | POA: Diagnosis present

## 2021-04-25 DIAGNOSIS — I214 Non-ST elevation (NSTEMI) myocardial infarction: Secondary | ICD-10-CM | POA: Diagnosis present

## 2021-04-25 DIAGNOSIS — I130 Hypertensive heart and chronic kidney disease with heart failure and stage 1 through stage 4 chronic kidney disease, or unspecified chronic kidney disease: Secondary | ICD-10-CM | POA: Diagnosis present

## 2021-04-25 DIAGNOSIS — Z20822 Contact with and (suspected) exposure to covid-19: Secondary | ICD-10-CM | POA: Diagnosis present

## 2021-04-25 DIAGNOSIS — X58XXXA Exposure to other specified factors, initial encounter: Secondary | ICD-10-CM | POA: Diagnosis present

## 2021-04-25 DIAGNOSIS — Z7982 Long term (current) use of aspirin: Secondary | ICD-10-CM

## 2021-04-25 LAB — CBC WITH DIFFERENTIAL/PLATELET
Abs Immature Granulocytes: 0.04 10*3/uL (ref 0.00–0.07)
Basophils Absolute: 0 10*3/uL (ref 0.0–0.1)
Basophils Relative: 0 %
Eosinophils Absolute: 0 10*3/uL (ref 0.0–0.5)
Eosinophils Relative: 0 %
HCT: 34.2 % — ABNORMAL LOW (ref 36.0–46.0)
Hemoglobin: 11.2 g/dL — ABNORMAL LOW (ref 12.0–15.0)
Immature Granulocytes: 0 %
Lymphocytes Relative: 10 %
Lymphs Abs: 1 10*3/uL (ref 0.7–4.0)
MCH: 33.8 pg (ref 26.0–34.0)
MCHC: 32.7 g/dL (ref 30.0–36.0)
MCV: 103.3 fL — ABNORMAL HIGH (ref 80.0–100.0)
Monocytes Absolute: 0.7 10*3/uL (ref 0.1–1.0)
Monocytes Relative: 7 %
Neutro Abs: 8 10*3/uL — ABNORMAL HIGH (ref 1.7–7.7)
Neutrophils Relative %: 83 %
Platelets: 262 10*3/uL (ref 150–400)
RBC: 3.31 MIL/uL — ABNORMAL LOW (ref 3.87–5.11)
RDW: 14.8 % (ref 11.5–15.5)
WBC: 9.8 10*3/uL (ref 4.0–10.5)
nRBC: 0 % (ref 0.0–0.2)

## 2021-04-25 NOTE — ED Triage Notes (Signed)
PT BIB Ems for weakness and SOB that started today while at home. Has increased work of breathing and CBG of 500dl per EMS. ? ?

## 2021-04-25 NOTE — ED Provider Notes (Signed)
?[place] DEPT ?[facility] Emergency Department ?Provider Note ?MRN:  [id number]  ?Arrival date & time: 04/26/21    ? ?Chief Complaint   ?Weakness (Sob) and Shortness of Breath (PT BIB Ems for weakness and SOB that started today while at home. Has increased work of breathing and CBG of 500dl per EMS.) ?  ?History of Present Illness   ?Karen [name] is a 88 y.o. year-old female presents to the ED with chief complaint of SOB.  She states that the SOB started this afternoon.  She states that it comes in waves.  She doesn't wear oxygen.  She denies fever, chills, or cough.  She states that she had a recent surgery on her left hip and has had blood drained off it once since the surgery.  She reports that she was transfused during a recent admission. ? ?History provided by patient. ? ? ?Review of Systems  ?Pertinent review of systems noted in HPI.  ? ? ?Physical Exam  ? ?Vitals:  ? 04/25/21 2300 04/26/21 0030  ?BP: 123/70 121/66  ?Pulse: 87 86  ?Resp: 12 15  ?Temp:    ?SpO2: 100% 100%  ?  ?CONSTITUTIONAL:  well-appearing, NAD ?NEURO:  Alert and oriented x 3, CN 3-12 grossly intact ?EYES:  eyes equal and reactive ?ENT/NECK:  Supple, no stridor  ?CARDIO:  normal rate, regular rhythm, appears well-perfused  ?PULM:  No respiratory distress, CTAB ?GI/GU:  non-distended, non-tendern ?MSK/SPINE:  Swelling to left lateral hip, wound is CDI, possible effusion? ?SKIN:  no rash, atraumatic ? ? ?*Additional and/or pertinent findings included in MDM below ? ?Diagnostic and Interventional Summary  ? ? EKG Interpretation ? ?Date/Time:  Saturday April 25 2021 21:52:48 EDT ?Ventricular Rate:  93 ?PR Interval:  189 ?QRS Duration: 115 ?QT Interval:  368 ?QTC Calculation: 458 ?R Axis:   0 ?Text Interpretation: Sinus rhythm Nonspecific intraventricular conduction delay Low voltage, precordial leads Probable anteroseptal infarct, old Confirmed by Dene Gentry [id number]) on 04/25/2021 10:48:43 PM ?  ? ?  ? ?Labs Reviewed  ?CBC  WITH DIFFERENTIAL/PLATELET - Abnormal; Notable for the following components:  ?    Result Value  ? RBC 3.31 (*)   ? Hemoglobin 11.2 (*)   ? HCT 34.2 (*)   ? MCV 103.3 (*)   ? Neutro Abs 8.0 (*)   ? All other components within normal limits  ?COMPREHENSIVE METABOLIC PANEL - Abnormal; Notable for the following components:  ? Sodium 133 (*)   ? CO2 20 (*)   ? Glucose, Bld 357 (*)   ? BUN 39 (*)   ? Creatinine, Ser 1.75 (*)   ? Albumin 3.3 (*)   ? GFR, Estimated 27 (*)   ? All other components within normal limits  ?BRAIN NATRIURETIC PEPTIDE - Abnormal; Notable for the following components:  ? B Natriuretic Peptide 162.2 (*)   ? All other components within normal limits  ?URINALYSIS, ROUTINE W REFLEX MICROSCOPIC - Abnormal; Notable for the following components:  ? Color, Urine STRAW (*)   ? Glucose, UA >=500 (*)   ? Ketones, ur 5 (*)   ? Leukocytes,Ua SMALL (*)   ? Bacteria, UA RARE (*)   ? Non Squamous Epithelial 0-5 (*)   ? All other components within normal limits  ?TROPONIN I (HIGH SENSITIVITY) - Abnormal; Notable for the following components:  ? Troponin I (High Sensitivity) 192 (*)   ? All other components within normal limits  ?TROPONIN I (HIGH SENSITIVITY)  ?  ?DG Chest 2  View  ?Final Result  ?  ?DG Hip Unilat W or Wo Pelvis 2-3 Views Left  ?Final Result  ?  ?  ?Medications - No data to display  ? ?Procedures  /  Critical Care ?.Critical Care ?Performed by: Montine Circle, PA-C ?Authorized by: Montine Circle, PA-C  ? ?Critical care provider statement:  ?  Critical care time (minutes):  51 ?  Critical care was necessary to treat or prevent imminent or life-threatening deterioration of the following conditions:  Circulatory failure ?  Critical care was time spent personally by me on the following activities:  Development of treatment plan with patient or surrogate, discussions with consultants, evaluation of patient's response to treatment, examination of patient, ordering and review of laboratory studies,  ordering and review of radiographic studies, ordering and performing treatments and interventions, pulse oximetry, re-evaluation of patient's condition and review of old charts ? ?ED Course and Medical Decision Making  ?I have reviewed the triage vital signs, the nursing notes, and pertinent available records from the EMR. ? ?Complexity of Problems Addressed: ?High Complexity: Acute illness/injury posing a threat to life or bodily function, requiring emergent diagnostic workup, evaluation, and treatment as below. ?Comorbidities affecting this illness/injury include: ?CAD, DM, HTN, HL, recent hip surgery ?Social Determinants Affecting Care: ? No clinically significant social determinants affecting this chief complaint.. ? ? ?ED Course: ?After considering the following differential, CHF, ACS, PE, COVID, flu, pneumonia, I ordered labs and imaging. ?I personally interpreted the labs which are notable for elevated troponin at 192, repeat is pending.  No significant leukocytosis.  BNP is mildly elevated.  She has GFR of 27. ?I visualized the chest x-ray which is notable for no opacity or effusion and agree with the radiologist interpretation. ?EKG shows no acute ischemic changes as above ?I observed the patient while on the cardiac monitor and noted no significant dysrhythmias.. ? ?Clinical Course as of 04/26/21 0118  ?Sun Apr 26, 2021  ?0116 Patient is high risk for PE given her recent surgery.  However, she cannot have a PE study due to her GFR.  Additionally, she has probable hematoma to left hip, which has been drained recently and appears to have reaccumulated on my physical exam.  Do not know that this patient would be the best candidate for receiving anticoagulants. [RB]  ?  ?Clinical Course User Index ?[RB] Montine Circle, PA-C  ? ? ?Consultants: ?I discussed the case with Hospitalist, Dr. Hal Hope, who is appreciated for admitting. ? ?Treatment and Plan: ?Admit to medicine for shortness of breath in the  setting of elevated troponin, recent hip surgery. ? ?Patient's exam and diagnostic results are concerning for possible PE versus CHF versus ACS.  Feel that patient will need admission to the hospital for further treatment and evaluation. ? ?Patient seen by and discussed with attending physician, Dr. Ralene Bathe, who agrees with plan for admission. ? ?Final Clinical Impressions(s) / ED Diagnoses  ? ?  ICD-10-CM   ?1. Shortness of breath  R06.02   ?  ?2. Elevated troponin  R77.8   ?  ?  ?ED Discharge Orders   ? ? None  ? ?  ?  ? ? ?Discharge Instructions Discussed with and Provided to Patient:  ? ?Discharge Instructions   ?None ?  ? ?  ?Montine Circle, PA-C ?04/26/21 0118 ? ?  ?Valarie Merino, MD ?04/26/21 1254 ? ?

## 2021-04-26 ENCOUNTER — Encounter (HOSPITAL_COMMUNITY): Payer: Self-pay | Admitting: Internal Medicine

## 2021-04-26 ENCOUNTER — Observation Stay (HOSPITAL_BASED_OUTPATIENT_CLINIC_OR_DEPARTMENT_OTHER): Payer: Medicare Other

## 2021-04-26 DIAGNOSIS — N183 Chronic kidney disease, stage 3 unspecified: Secondary | ICD-10-CM | POA: Diagnosis present

## 2021-04-26 DIAGNOSIS — E1165 Type 2 diabetes mellitus with hyperglycemia: Secondary | ICD-10-CM | POA: Diagnosis present

## 2021-04-26 DIAGNOSIS — R0602 Shortness of breath: Secondary | ICD-10-CM | POA: Diagnosis not present

## 2021-04-26 DIAGNOSIS — R609 Edema, unspecified: Secondary | ICD-10-CM | POA: Diagnosis not present

## 2021-04-26 DIAGNOSIS — R778 Other specified abnormalities of plasma proteins: Secondary | ICD-10-CM | POA: Diagnosis present

## 2021-04-26 DIAGNOSIS — R7989 Other specified abnormal findings of blood chemistry: Secondary | ICD-10-CM | POA: Diagnosis present

## 2021-04-26 DIAGNOSIS — D539 Nutritional anemia, unspecified: Secondary | ICD-10-CM | POA: Diagnosis present

## 2021-04-26 DIAGNOSIS — E1169 Type 2 diabetes mellitus with other specified complication: Secondary | ICD-10-CM | POA: Diagnosis present

## 2021-04-26 LAB — URINALYSIS, ROUTINE W REFLEX MICROSCOPIC
Bilirubin Urine: NEGATIVE
Glucose, UA: >=500 mg/dL — AB
Hgb urine dipstick: NEGATIVE
Ketones, ur: 5 mg/dL — AB
Nitrite: NEGATIVE
Protein, ur: NEGATIVE mg/dL
Specific Gravity, Urine: 1.009 (ref 1.005–1.030)
pH: 5 (ref 5.0–8.0)

## 2021-04-26 LAB — GLUCOSE, CAPILLARY
Glucose-Capillary: 284 mg/dL — ABNORMAL HIGH (ref 70–99)
Glucose-Capillary: 241 mg/dL — ABNORMAL HIGH (ref 70–99)
Glucose-Capillary: 210 mg/dL — ABNORMAL HIGH (ref 70–99)
Glucose-Capillary: 223 mg/dL — ABNORMAL HIGH (ref 70–99)
Glucose-Capillary: 176 mg/dL — ABNORMAL HIGH (ref 70–99)

## 2021-04-26 LAB — CBC
HCT: 32 % — ABNORMAL LOW (ref 36.0–46.0)
Hemoglobin: 10.4 g/dL — ABNORMAL LOW (ref 12.0–15.0)
MCH: 33.9 pg (ref 26.0–34.0)
MCHC: 32.5 g/dL (ref 30.0–36.0)
MCV: 104.2 fL — ABNORMAL HIGH (ref 80.0–100.0)
Platelets: 252 10*3/uL (ref 150–400)
RBC: 3.07 MIL/uL — ABNORMAL LOW (ref 3.87–5.11)
RDW: 14.7 % (ref 11.5–15.5)
WBC: 7.6 10*3/uL (ref 4.0–10.5)
nRBC: 0 % (ref 0.0–0.2)

## 2021-04-26 LAB — HEMOGLOBIN A1C
Hgb A1c MFr Bld: 9.1 % — ABNORMAL HIGH (ref 4.8–5.6)
Mean Plasma Glucose: 214.47 mg/dL

## 2021-04-26 LAB — BASIC METABOLIC PANEL
Anion gap: 11 (ref 5–15)
BUN: 36 mg/dL — ABNORMAL HIGH (ref 8–23)
CO2: 17 mmol/L — ABNORMAL LOW (ref 22–32)
Calcium: 8.5 mg/dL — ABNORMAL LOW (ref 8.9–10.3)
Chloride: 104 mmol/L (ref 98–111)
Creatinine, Ser: 1.6 mg/dL — ABNORMAL HIGH (ref 0.44–1.00)
GFR, Estimated: 30 mL/min — ABNORMAL LOW (ref >60)
Glucose, Bld: 318 mg/dL — ABNORMAL HIGH (ref 70–99)
Potassium: 4.9 mmol/L (ref 3.5–5.1)
Sodium: 132 mmol/L — ABNORMAL LOW (ref 135–145)

## 2021-04-26 LAB — COMPREHENSIVE METABOLIC PANEL
ALT: 19 U/L (ref 0–44)
AST: 24 U/L (ref 15–41)
Albumin: 3.3 g/dL — ABNORMAL LOW (ref 3.5–5.0)
Alkaline Phosphatase: 74 U/L (ref 38–126)
Anion gap: 12 (ref 5–15)
BUN: 39 mg/dL — ABNORMAL HIGH (ref 8–23)
CO2: 20 mmol/L — ABNORMAL LOW (ref 22–32)
Calcium: 9.1 mg/dL (ref 8.9–10.3)
Chloride: 101 mmol/L (ref 98–111)
Creatinine, Ser: 1.75 mg/dL — ABNORMAL HIGH (ref 0.44–1.00)
GFR, Estimated: 27 mL/min — ABNORMAL LOW (ref >60)
Glucose, Bld: 357 mg/dL — ABNORMAL HIGH (ref 70–99)
Potassium: 4.7 mmol/L (ref 3.5–5.1)
Sodium: 133 mmol/L — ABNORMAL LOW (ref 135–145)
Total Bilirubin: 0.4 mg/dL (ref 0.3–1.2)
Total Protein: 6.8 g/dL (ref 6.5–8.1)

## 2021-04-26 LAB — VITAMIN B12: Vitamin B-12: 174 pg/mL — ABNORMAL LOW (ref 180–914)

## 2021-04-26 LAB — FERRITIN: Ferritin: 115 ng/mL (ref 11–307)

## 2021-04-26 LAB — FOLATE: Folate: 53.8 ng/mL (ref >5.9)

## 2021-04-26 LAB — RETICULOCYTES
Immature Retic Fract: 13.5 % (ref 2.3–15.9)
RBC.: 3.11 MIL/uL — ABNORMAL LOW (ref 3.87–5.11)
Retic Count, Absolute: 137.5 10*3/uL (ref 19.0–186.0)
Retic Ct Pct: 4.4 % — ABNORMAL HIGH (ref 0.4–3.1)

## 2021-04-26 LAB — IRON AND TIBC
Iron: 75 ug/dL (ref 28–170)
Saturation Ratios: 26 % (ref 10.4–31.8)
TIBC: 293 ug/dL (ref 250–450)
UIBC: 218 ug/dL

## 2021-04-26 LAB — BRAIN NATRIURETIC PEPTIDE: B Natriuretic Peptide: 162.2 pg/mL — ABNORMAL HIGH (ref 0.0–100.0)

## 2021-04-26 LAB — TROPONIN I (HIGH SENSITIVITY)
Troponin I (High Sensitivity): 146 ng/L (ref <18)
Troponin I (High Sensitivity): 151 ng/L (ref <18)
Troponin I (High Sensitivity): 180 ng/L (ref <18)
Troponin I (High Sensitivity): 192 ng/L (ref <18)

## 2021-04-26 MED ORDER — QUETIAPINE FUMARATE 100 MG PO TABS
200.0000 mg | ORAL_TABLET | Freq: Every day | ORAL | Status: DC
  Administered 2021-04-26 – 2021-04-30 (×5): 200 mg via ORAL
  Filled 2021-04-26 (×5): qty 2

## 2021-04-26 MED ORDER — INSULIN ASPART 100 UNIT/ML IJ SOLN
0.0000 [IU] | Freq: Three times a day (TID) | INTRAMUSCULAR | Status: DC
  Administered 2021-04-26 (×3): 3 [IU] via SUBCUTANEOUS
  Administered 2021-04-27 – 2021-04-28 (×3): 5 [IU] via SUBCUTANEOUS

## 2021-04-26 MED ORDER — HYDRALAZINE HCL 20 MG/ML IJ SOLN: 10.0000 mg | INTRAMUSCULAR | Status: DC | PRN

## 2021-04-26 MED ORDER — INSULIN GLARGINE-YFGN 100 UNIT/ML ~~LOC~~ SOLN
5.0000 [IU] | Freq: Every day | SUBCUTANEOUS | Status: DC
  Administered 2021-04-26 – 2021-04-28 (×3): 5 [IU] via SUBCUTANEOUS
  Filled 2021-04-26 (×3): qty 0.05

## 2021-04-26 MED ORDER — ACETAMINOPHEN 650 MG RE SUPP: 650.0000 mg | Freq: Four times a day (QID) | RECTAL | Status: DC | PRN

## 2021-04-26 MED ORDER — HYDROCODONE-ACETAMINOPHEN 5-325 MG PO TABS
0.5000 | ORAL_TABLET | Freq: Two times a day (BID) | ORAL | Status: DC | PRN
  Administered 2021-04-28 – 2021-04-29 (×2): 0.5 via ORAL
  Filled 2021-04-26 (×2): qty 1

## 2021-04-26 MED ORDER — ATORVASTATIN CALCIUM 40 MG PO TABS
40.0000 mg | ORAL_TABLET | Freq: Every day | ORAL | Status: DC
  Administered 2021-04-26 – 2021-04-30 (×5): 40 mg via ORAL
  Filled 2021-04-26 (×5): qty 1

## 2021-04-26 MED ORDER — MIRTAZAPINE 7.5 MG PO TABS
15.0000 mg | ORAL_TABLET | Freq: Every day | ORAL | Status: DC
  Administered 2021-04-26 – 2021-04-30 (×5): 15 mg via ORAL
  Filled 2021-04-26 (×6): qty 2

## 2021-04-26 MED ORDER — ASPIRIN EC 325 MG PO TBEC
325.0000 mg | DELAYED_RELEASE_TABLET | Freq: Every day | ORAL | Status: DC
  Administered 2021-04-26 – 2021-04-29 (×4): 325 mg via ORAL
  Filled 2021-04-26 (×4): qty 1

## 2021-04-26 MED ORDER — ACETAMINOPHEN 325 MG PO TABS
650.0000 mg | ORAL_TABLET | Freq: Four times a day (QID) | ORAL | Status: DC | PRN
  Administered 2021-04-27 – 2021-04-30 (×5): 650 mg via ORAL
  Filled 2021-04-26 (×5): qty 2

## 2021-04-26 MED ORDER — LIP MEDEX EX OINT
1.0000 "application " | TOPICAL_OINTMENT | CUTANEOUS | Status: DC | PRN
  Filled 2021-04-26: qty 7

## 2021-04-26 NOTE — Plan of Care (Signed)

## 2021-04-26 NOTE — H&P (Signed)
?History and Physical  ? ? ?Karen [name] WJX:[id number] DOB: Feb 25, 1928 DOA: 04/25/2021 ? ?PCP: [name], DO  ?Patient coming from: Home. ? ?Chief Complaint: Shortness of breath. ? ?HPI: Karen [name] is a 88 y.o. female with history of CAD status post stenting, CHF, hypertension, chronic and disease stage III, diabetes mellitus who has had a recent left hip surgery for hip fracture follows with Dr. Maureen Ralphs orthopedic surgeon and also had some fluid drained which as per the patient was bloody after patient had followed up with the orthopedic surgeon presents to the ER after patient became acutely short of breath at home last evening.  Patient denies any associated chest pain or productive cough fever or chills.  When EMS arrived it was noted that patient also was hypotensive.  Recently patient blood sugar also has been running high and patient states her primary care increased her glipizide dose. ? ?ED Course: In the ER chest x-ray was unremarkable on exam patient does have some swelling in the left lower extremity.  Labs show elevated troponin with EKG showing normal sinus rhythm with nonspecific changes.  Since creatinine was elevated with GFR of 27 CT angiogram was unable to be done to rule out PE.  Patient admitted for further observation given the elevated troponin and shortness of breath.  COVID test is pending. ? ?Review of Systems: As per HPI, rest all negative. ? ? ?Past Medical History:  ?Diagnosis Date  ? Arthritis   ? CHF (congestive heart failure) ([place])   ? Constipation   ? Coronary artery disease 04/2013  ? Three-vessel disease, initial medical therapy then stenting of the LAD and CFX 10/2013  ? Diabetes mellitus   ? TYPE 2  ? Fibromyalgia   ? H/O: gout   ? Heart murmur   ? Hx: UTI (urinary tract infection)   ? Hypertension   ? ? ?Past Surgical History:  ?Procedure Laterality Date  ? APPENDECTOMY  1951  ? [place]  ? CARDIAC CATHETERIZATION  04/2013  ? Left main 30-40%, prox LAD  40%, mid/distal LAD 90%, CFX 70%, OM 3 90%, RCA 40-50%, PDA 20-80%, EF 50%   ? CHOLECYSTECTOMY  1951  ? Closed reduction of Left Total Hip  2008  ? x 3  ? CORONARY STENT PLACEMENT  10/11/2013  ? 2.25 x 20 mm Promus DES to mid/distal LAD & 3.5 x 12 mm Promus DES Mid CX         DR [place]  ? [place]  ? JOINT REPLACEMENT  left hip-2007, left knee-2000, right knee-1997  ? Left Hip, Bilateral Knee replacements  ? KNEE SURGERY  1997/2000  ? LEFT HEART CATHETERIZATION WITH CORONARY ANGIOGRAM N/A 04/25/2013  ? Procedure: LEFT HEART CATHETERIZATION WITH CORONARY ANGIOGRAM;  Surgeon: Sanda Klein, MD;  Location[facility] CATH LAB;  Service: Cardiovascular;  Laterality: N/A;  ? PERCUTANEOUS CORONARY STENT INTERVENTION (PCI-S) N/A 10/11/2013  ? Procedure: PERCUTANEOUS CORONARY STENT INTERVENTION (PCI-S);  Surgeon: Peter M [place]ique, MD;  Location: [facility] CATH LAB;  Service: Cardiovascular;  Laterality: N/A;  ? REPLACEMENT TOTAL HIP W/  RESURFACING IMPLANTS  [id number]  ? Right Foot Surgery  2004  ? x 2  ? Salivary Gland Stone Extraction    ? SPINAL FUSION  2006  ? TOTAL HIP ARTHROPLASTY Right 05/14/2020  ? Procedure: TOTAL HIP ARTHROPLASTY ANTERIOR APPROACH;  Surgeon: Gaynelle Arabian, MD;  Location: [facility] ORS;  Service: Orthopedics;  Laterality: Right;  172mn  ? TOTAL HIP REVISION Left  04/08/2021  ? Procedure: TOTAL HIP REVISION;  Surgeon: Gaynelle Arabian, MD;  Location: [facility] ORS;  Service: Orthopedics;  Laterality: Left;  ? ? ? reports that she has never smoked. She has never used smokeless tobacco. She reports that she does not drink alcohol and does not use drugs. ? ?Allergies  ?Allergen Reactions  ? Lisinopril Cough  ?  Ace inhibitor  ? Pneumococcal Vaccines Swelling and Other (See Comments)  ?  Very bad pain and swelling in the arm of the shot, needed 2 cortisone shots.  ? Antihistamines, Diphenhydramine-Type Other (See Comments)  ? Carvedilol Other (See Comments)  ?  Weakness, dizziness, upset stomach, trembling   ? Codeine Nausea And  Vomiting  ? Morphine And Related Nausea And Vomiting  ? Ultram [Tramadol Hcl] Itching  ? ? ?Family History  ?Problem Relation Age of Onset  ? Cancer Mother   ? Heart attack Father   ? Stroke Father   ? Heart attack Brother   ? Cancer Brother   ? ? ?Prior to Admission medications   ?Medication Sig Start Date End Date Taking? Authorizing Provider  ?amLODipine (NORVASC) 5 MG tablet Take 1 tablet (5 mg total) by mouth daily. ?Patient taking differently: Take 5 mg by mouth 2 (two) times daily with a meal. 12/18/13  Yes Burtis Junes, NP  ?aspirin EC 325 MG EC tablet Take 1 tablet (325 mg total) by mouth daily with breakfast for 20 days. Then take one 81 mg aspirin once a day for three weeks. Then discontinue aspirin. 04/09/21 04/29/21 Yes Edmisten, Kristie L, PA  ?atorvastatin (LIPITOR) 40 MG tablet Take 40 mg by mouth at bedtime.   Yes [provider]  ?Calcium Carbonate-Vitamin D 600-400 MG-UNIT tablet Take 1 tablet by mouth every morning.   Yes [provider]  ?Coenzyme Q10 (CO Q 10) 100 MG CAPS Take 300 mg by mouth every morning.   Yes [provider]  ?furosemide (LASIX) 20 MG tablet Take 1 tablet (20 mg total) by mouth daily. 08/08/20  Yes Croitoru, Mihai, MD  ?glipiZIDE (GLUCOTROL XL) 5 MG 24 hr tablet Take 10 mg by mouth every morning. 01/12/21  Yes [provider]  ?HYDROcodone-acetaminophen (NORCO/VICODIN) 5-325 MG tablet Take 1-2 tablets by mouth every 6 (six) hours as needed for severe pain (pain score 4-6). ?Patient taking differently: Take 0.5 tablets by mouth 2 (two) times daily as needed for severe pain (pain score 4-6). 04/09/21  Yes Edmisten, Ok Anis, PA  ?Magnesium 250 MG TABS Take 250 mg by mouth every morning.   Yes [provider]  ?metFORMIN (GLUCOPHAGE) 1000 MG tablet Take 1 tablet (1,000 mg total) by mouth 2 (two) times daily with a meal. Hold for 2 days, restart on 10/14/2013 ?Patient taking differently: Take 1,000 mg by mouth 2 (two) times daily after  a meal. 10/12/13  Yes Barrett, Evelene Croon, PA-C  ?metoprolol succinate (TOPROL-XL) 50 MG 24 hr tablet Take 50 mg by mouth every morning. Take with or immediately following a meal.   Yes [provider]  ?mirtazapine (REMERON) 15 MG tablet Take 15 mg by mouth at bedtime.   Yes [provider]  ?Multiple Vitamin (MULTIVITAMIN WITH MINERALS) TABS tablet Take 1 tablet by mouth every morning.   Yes [provider]  ?Polyethyl Glycol-Propyl Glycol (SYSTANE ULTRA) 0.4-0.3 % SOLN Place 1 drop into both eyes daily as needed (dry eyes).   Yes [provider]  ?potassium chloride SA (K-DUR,KLOR-CON) 20 MEQ tablet Take 1 tablet (  20 mEq total) by mouth 2 (two) times daily. ?Patient taking differently: Take 20 mEq by mouth 2 (two) times daily after a meal. 12/18/13  Yes Burtis Junes, NP  ?QUEtiapine (SEROQUEL) 200 MG tablet Take 200 mg by mouth at bedtime.   Yes [provider]  ?valsartan (DIOVAN) 320 MG tablet Take 1 tablet (320 mg total) by mouth daily. ?Patient taking differently: Take 320 mg by mouth every morning. 09/13/13  Yes Croitoru, Mihai, MD  ?acetaminophen (TYLENOL) 650 MG CR tablet Take 650 mg by mouth at bedtime as needed (arthritis pain).    [provider]  ?meloxicam (MOBIC) 7.5 MG tablet Take 7.5 mg by mouth every morning. ?Patient not taking: Reported on 04/26/2021 04/14/21   [provider]  ? ? ?Physical Exam: ?Constitutional: Moderately built and nourished. ?Vitals:  ? 04/25/21 2230 04/25/21 2300 04/26/21 0030 04/26/21 0244  ?BP: 133/69 123/70 121/66 (!) 155/76  ?Pulse: 85 87 86 95  ?Resp: '16 12 15 20  '$ ?Temp:    97.9 ?F (36.6 ?C)  ?TempSrc:    Oral  ?SpO2: 96% 100% 100% 97%  ?Weight:    66.7 kg  ?Height:    '5\' 4"'$  (1.626 m)  ? ?Eyes: Anicteric no pallor. ?ENMT: No discharge from the ears eyes nose and mouth. ?Neck: No mass felt.  No neck rigidity. ?Respiratory: No rhonchi or crepitations. ?Cardiovascular: S1-S2 heard. ?Abdomen: Soft nontender bowel  sound present. ?Musculoskeletal: No swelling of the left lower extremity-some ecchymotic area on the left hip area. ?Skin: Ecchymotic area left hip area. ?Neurologic: Alert awake oriented time place and p

## 2021-04-26 NOTE — Progress Notes (Signed)
Lower extremity venous has been completed.  ? ?Preliminary results in CV Proc.  ? ?Karen [name] ?04/26/2021 9:23 AM    ?

## 2021-04-26 NOTE — Progress Notes (Addendum)
Subjective: ?Patient admitted this morning, see detailed H&P by Dr. Hal Hope ?88 year old female with history of CAD s/p stenting, CHF, hypertension, CKD stage III, diabetes mellitus presented with shortness of breath.  Patient had recent left hip surgery for hip fracture and sees Dr. Reynaldo Minium.  She recently had seen to [facility] and had fluid drained from the left buttock which was bloody.  There was a concern for PE, patient also had swelling of the left lower extremity.  CTA could not be obtained due to low GFR.  VQ scan ordered. ? ?Vitals:  ? 04/26/21 1017 04/26/21 1414  ?BP: 139/70 127/69  ?Pulse: 97 96  ?Resp: 16 16  ?Temp: 98 ?F (36.7 ?C) 98.9 ?F (37.2 ?C)  ?SpO2: 100% 100%  ? ? ? ? ?A/P ? ?Dyspnea on exertion ?-Improved, patient requiring 2 L/min of oxygen via nasal cannula ?-VQ scan is still pending ?-Troponin was elevated at 180, now downtrending to 151 ?-Venous duplex of lower extremities is negative for DVT ? ?Recent hip surgery ?-Patient has area of induration in the left buttock ?-Nontender to palpation ?-X-ray of left hip was unremarkable except for soft tissue swelling ? ?CAD s/p stent placement ?-Denies chest pain ?-Continue aspirin, statin ?-Patient's troponin was elevated on admission at 192, now downtrending to 180, 151 ?-Called and discussed with cardiology Dr. Sallyanne Kuster, he recommends obtaining echocardiogram.  No indication for urgent intervention.  EKG is unremarkable. ? ?History of hypertension ?-Anticoagulants medications on hold ?-Blood pressure is stable ? ?Microcytic anemia ?-Hemoglobin is 10.4 ?-At baseline ? ?Diabetes mellitus type 2 ?-Continue patient on Lantus ?-Continue sliding scale insulin NovoLog ? ?CKD stage III  ?-Creatinine 1.60 ?-At baseline ? ? ?[name] ?[facility] ?Pager- [phone]  ?

## 2021-04-27 ENCOUNTER — Inpatient Hospital Stay (HOSPITAL_COMMUNITY): Payer: Medicare Other

## 2021-04-27 ENCOUNTER — Observation Stay (HOSPITAL_COMMUNITY): Payer: Medicare Other

## 2021-04-27 DIAGNOSIS — J441 Chronic obstructive pulmonary disease with (acute) exacerbation: Secondary | ICD-10-CM | POA: Diagnosis present

## 2021-04-27 DIAGNOSIS — X58XXXA Exposure to other specified factors, initial encounter: Secondary | ICD-10-CM | POA: Diagnosis present

## 2021-04-27 DIAGNOSIS — E1165 Type 2 diabetes mellitus with hyperglycemia: Secondary | ICD-10-CM | POA: Diagnosis present

## 2021-04-27 DIAGNOSIS — I2510 Atherosclerotic heart disease of native coronary artery without angina pectoris: Secondary | ICD-10-CM

## 2021-04-27 DIAGNOSIS — I10 Essential (primary) hypertension: Secondary | ICD-10-CM | POA: Diagnosis not present

## 2021-04-27 DIAGNOSIS — Z96641 Presence of right artificial hip joint: Secondary | ICD-10-CM | POA: Diagnosis present

## 2021-04-27 DIAGNOSIS — I5032 Chronic diastolic (congestive) heart failure: Secondary | ICD-10-CM | POA: Diagnosis not present

## 2021-04-27 DIAGNOSIS — R0609 Other forms of dyspnea: Secondary | ICD-10-CM | POA: Diagnosis not present

## 2021-04-27 DIAGNOSIS — I77810 Thoracic aortic ectasia: Secondary | ICD-10-CM | POA: Diagnosis not present

## 2021-04-27 DIAGNOSIS — K611 Rectal abscess: Secondary | ICD-10-CM | POA: Diagnosis present

## 2021-04-27 DIAGNOSIS — N1831 Chronic kidney disease, stage 3a: Secondary | ICD-10-CM | POA: Diagnosis not present

## 2021-04-27 DIAGNOSIS — D509 Iron deficiency anemia, unspecified: Secondary | ICD-10-CM | POA: Diagnosis present

## 2021-04-27 DIAGNOSIS — E1122 Type 2 diabetes mellitus with diabetic chronic kidney disease: Secondary | ICD-10-CM | POA: Diagnosis present

## 2021-04-27 DIAGNOSIS — Z7984 Long term (current) use of oral hypoglycemic drugs: Secondary | ICD-10-CM | POA: Diagnosis not present

## 2021-04-27 DIAGNOSIS — N1832 Chronic kidney disease, stage 3b: Secondary | ICD-10-CM | POA: Diagnosis present

## 2021-04-27 DIAGNOSIS — I214 Non-ST elevation (NSTEMI) myocardial infarction: Secondary | ICD-10-CM | POA: Diagnosis present

## 2021-04-27 DIAGNOSIS — R7989 Other specified abnormal findings of blood chemistry: Secondary | ICD-10-CM | POA: Diagnosis not present

## 2021-04-27 DIAGNOSIS — Z809 Family history of malignant neoplasm, unspecified: Secondary | ICD-10-CM | POA: Diagnosis not present

## 2021-04-27 DIAGNOSIS — J9601 Acute respiratory failure with hypoxia: Secondary | ICD-10-CM | POA: Diagnosis present

## 2021-04-27 DIAGNOSIS — Z8249 Family history of ischemic heart disease and other diseases of the circulatory system: Secondary | ICD-10-CM | POA: Diagnosis not present

## 2021-04-27 DIAGNOSIS — R918 Other nonspecific abnormal finding of lung field: Secondary | ICD-10-CM | POA: Diagnosis not present

## 2021-04-27 DIAGNOSIS — R0602 Shortness of breath: Secondary | ICD-10-CM | POA: Diagnosis present

## 2021-04-27 DIAGNOSIS — D539 Nutritional anemia, unspecified: Secondary | ICD-10-CM | POA: Diagnosis present

## 2021-04-27 DIAGNOSIS — M797 Fibromyalgia: Secondary | ICD-10-CM | POA: Diagnosis present

## 2021-04-27 DIAGNOSIS — Z20822 Contact with and (suspected) exposure to covid-19: Secondary | ICD-10-CM | POA: Diagnosis present

## 2021-04-27 DIAGNOSIS — R778 Other specified abnormalities of plasma proteins: Secondary | ICD-10-CM | POA: Diagnosis not present

## 2021-04-27 DIAGNOSIS — Z823 Family history of stroke: Secondary | ICD-10-CM | POA: Diagnosis not present

## 2021-04-27 DIAGNOSIS — I700 Atherosclerosis of aorta: Secondary | ICD-10-CM | POA: Diagnosis not present

## 2021-04-27 DIAGNOSIS — Z79899 Other long term (current) drug therapy: Secondary | ICD-10-CM | POA: Diagnosis not present

## 2021-04-27 DIAGNOSIS — I5042 Chronic combined systolic (congestive) and diastolic (congestive) heart failure: Secondary | ICD-10-CM | POA: Diagnosis present

## 2021-04-27 DIAGNOSIS — Z955 Presence of coronary angioplasty implant and graft: Secondary | ICD-10-CM | POA: Diagnosis not present

## 2021-04-27 DIAGNOSIS — Z7982 Long term (current) use of aspirin: Secondary | ICD-10-CM | POA: Diagnosis not present

## 2021-04-27 DIAGNOSIS — Z96653 Presence of artificial knee joint, bilateral: Secondary | ICD-10-CM | POA: Diagnosis present

## 2021-04-27 DIAGNOSIS — Z981 Arthrodesis status: Secondary | ICD-10-CM | POA: Diagnosis not present

## 2021-04-27 DIAGNOSIS — R931 Abnormal findings on diagnostic imaging of heart and coronary circulation: Secondary | ICD-10-CM | POA: Diagnosis not present

## 2021-04-27 DIAGNOSIS — I130 Hypertensive heart and chronic kidney disease with heart failure and stage 1 through stage 4 chronic kidney disease, or unspecified chronic kidney disease: Secondary | ICD-10-CM | POA: Diagnosis present

## 2021-04-27 LAB — ECHOCARDIOGRAM COMPLETE
AR max vel: 2.26 cm2
AV Peak grad: 7.4 mmHg
Ao pk vel: 1.36 m/s
Area-P 1/2: 2.39 cm2
Calc EF: 52.1 %
Height: 64 in
MV VTI: 2.04 cm2
P 1/2 time: 342 msec
S' Lateral: 2.3 cm
Single Plane A2C EF: 54.7 %
Single Plane A4C EF: 51 %
Weight: 2352.75 oz

## 2021-04-27 LAB — GLUCOSE, CAPILLARY
Glucose-Capillary: 262 mg/dL — ABNORMAL HIGH (ref 70–99)
Glucose-Capillary: 258 mg/dL — ABNORMAL HIGH (ref 70–99)
Glucose-Capillary: 219 mg/dL — ABNORMAL HIGH (ref 70–99)
Glucose-Capillary: 257 mg/dL — ABNORMAL HIGH (ref 70–99)

## 2021-04-27 LAB — RESP PANEL BY RT-PCR (FLU A&B, COVID) ARPGX2
Influenza A by PCR: NEGATIVE
Influenza B by PCR: NEGATIVE
SARS Coronavirus 2 by RT PCR: NEGATIVE

## 2021-04-27 MED ORDER — FUROSEMIDE 10 MG/ML IJ SOLN
20.0000 mg | Freq: Once | INTRAMUSCULAR | Status: AC
  Administered 2021-04-27: 20 mg via INTRAVENOUS
  Filled 2021-04-27: qty 2

## 2021-04-27 MED ORDER — CYANOCOBALAMIN 1000 MCG/ML IJ SOLN
1000.0000 ug | Freq: Every day | INTRAMUSCULAR | Status: DC
Start: 2021-04-27 — End: 2021-05-01
  Administered 2021-04-27 – 2021-05-01 (×5): 1000 ug via INTRAMUSCULAR
  Filled 2021-04-27 (×5): qty 1

## 2021-04-27 MED ORDER — ENOXAPARIN SODIUM 30 MG/0.3ML IJ SOSY
30.0000 mg | PREFILLED_SYRINGE | Freq: Every day | INTRAMUSCULAR | Status: DC
  Administered 2021-04-27: 30 mg via SUBCUTANEOUS
  Filled 2021-04-27: qty 0.3

## 2021-04-27 MED ORDER — TECHNETIUM TO 99M ALBUMIN AGGREGATED
4.4000 | Freq: Once | INTRAVENOUS | Status: AC | PRN
  Administered 2021-04-27: 4.4 via INTRAVENOUS

## 2021-04-27 NOTE — TOC Initial Note (Signed)
Transition of Care (TOC) - Initial/Assessment Note  ? ? ?Patient Details  ?Name: Karen [name] ?MRN: [id number] ?Date of Birth: 04/16/1928 ? ?Transition of Care [facility]) CM/SW Contact:    ?Dessa Phi, RN ?Phone Number: ?04/27/2021, 1:19 PM ? ?Clinical Narrative: spoke to son Real Cons has already provided dme in past;noted may need 02-monitor son has no prference for dme;Used [facility] inpast HHPT-Steven not sure if needed again but prefer [facility] of ordered.Family has own transport home                ? ? ?Expected Discharge Plan: Home/Self Care ?Barriers to Discharge: Continued Medical Work up ? ? ?Patient Goals and CMS Choice ?Patient states their goals for this hospitalization and ongoing recovery are:: Home ?CMS Medicare.gov Compare Post Acute Care list provided to:: Patient Represenative (must comment) (Steven(son) [phone]) ?Choice offered to / list presented to : Adult Children ? ?Expected Discharge Plan and Services ?Expected Discharge Plan: Home/Self Care ?  ?Discharge Planning Services: CM Consult ?Post Acute Care Choice: Durable Medical Equipment ?Living arrangements for the past 2 months: [place] ?                ?  ?  ?  ?  ?  ?  ?  ?  ?  ?  ? ?Prior Living Arrangements/Services ?Living arrangements for the past 2 months: [place] ?Lives with:: Adult Children ?Patient language and need for interpreter reviewed:: Yes ?Do you feel safe going back to the place where you live?: Yes      ?Need for Family Participation in Patient Care: Yes (Comment) ?Care giver support system in place?: Yes (comment) ?Current home services: DME, Home PT (rw,cane;[facility] HHPT) ?Criminal Activity/Legal Involvement Pertinent to Current Situation/Hospitalization: No - Comment as needed ? ?Activities of Daily Living ?Home Assistive Devices/Equipment: Gilford Rile (specify type) ?ADL Screening (condition at time of admission) ?Patient's cognitive ability adequate to safely complete daily activities?:  Yes ?Is the patient deaf or have difficulty hearing?: Yes ?Does the patient have difficulty seeing, even when wearing glasses/contacts?: No ?Does the patient have difficulty concentrating, remembering, or making decisions?: No ?Patient able to express need for assistance with ADLs?: Yes ?Does the patient have difficulty dressing or bathing?: No ?Independently performs ADLs?: Yes (appropriate for developmental age) ?Does the patient have difficulty walking or climbing stairs?: Yes ?Weakness of Legs: Both ?Weakness of Arms/Hands: Both ? ?Permission Sought/Granted ?Permission sought to share information with : Case Manager ?Permission granted to share information with : Yes, Verbal Permission Granted ? Share Information with NAME: Case Manager ?   ? Permission granted to share info w Relationship: [phone] ?   ? ?Emotional Assessment ?Appearance:: Appears stated age ?Attitude/Demeanor/Rapport: Gracious ?Affect (typically observed): Accepting ?Orientation: : Oriented to Self, Oriented to Place, Oriented to  Time ?Alcohol / Substance Use: Not Applicable ?Psych Involvement: No (comment) ? ?Admission diagnosis:  Shortness of breath [R06.02] ?SOB (shortness of breath) [R06.02] ?Elevated troponin [R77.8] ?Acute hypoxemic respiratory failure ([place]) [J96.01] ?Patient Active Problem List  ? Diagnosis Date Noted  ? Acute hypoxemic respiratory failure ([place]) 04/27/2021  ? SOB (shortness of breath) 04/26/2021  ? Elevated troponin 04/26/2021  ? Uncontrolled type 2 diabetes mellitus with hyperglycemia ([place]) 04/26/2021  ? CKD (chronic kidney disease) stage 3, GFR 30-59 ml/min (H[facility]) 04/26/2021  ? Macrocytic anemia 04/26/2021  ? S/P total hip arthroplasty 04/08/2021  ? Closed dislocation of left hip, initial encounter ([place]) 04/08/2021  ? OA (osteoarthritis) of hip 05/14/2020  ?  Primary osteoarthritis of right hip 05/14/2020  ? Bilateral leg weakness 07/15/2015  ? Angina pectoris ([place]) 10/11/2013  ? Dyspnea on exertion  10/09/2013  ? Cardiomyopathy, mixed hypertensive and ischemic etiology 05/27/2013  ? Hyperlipidemia 05/27/2013  ? CAD (coronary artery disease) 05/27/2013  ? Chronic combined systolic and diastolic congestive heart failure ([place]) 04/12/2013  ? Aneurysm of thoracic aorta 01/19/2013  ? Altered mental status 05/16/2012  ? Hallucinations 05/16/2012  ? Hypokalemia 05/16/2012  ? Diabetes mellitus ([place]) 05/16/2012  ? Essential hypertension 05/16/2012  ? ?PCP:  [name], DO ?Pharmacy:   ?[facility] [place], [address] AT [address] ?[place] ?[place] [address] ?Phone: [phone] Fax: [phone] ? ? ? ? ?Social Determinants of Health (SDOH) Interventions ?  ? ?Readmission Risk Interventions ?No flowsheet data found. ? ? ?

## 2021-04-27 NOTE — Progress Notes (Addendum)
[facility] ? ?PROGRESS NOTE ? ?Karen [name] PJA:[id number] DOB: 23-Sep-1928 DOA: 04/25/2021 ?PCP: Karen Morning, DO ? ? ?Brief HPI:   ?88 year old female with history of CAD s/p stenting, CHF, hypertension, CKD stage III, diabetes mellitus presented with shortness of breath.  Patient had recent left hip surgery for hip fracture and sees Karen [name].  She recently had Karen [name] and had fluid drained from the left buttock which was bloody.  There was a concern for PE, patient also had swelling of the left lower extremity.  CTA could not be obtained due to low GFR.  VQ scan ordered. ?  ?Subjective  ? ?Patient seen and examined, still has dyspnea on exertion.  Requiring 2 L pulled off oxygen.  VQ scan is currently pending. ? ? Assessment/Plan:  ? ? ? ?Dyspnea on exertion ?-Improved, patient requiring 2 L/min of oxygen via nasal cannula ?-VQ scan is still pending ?-Troponin was elevated at 180, now downtrending to 151 ?-Venous duplex of lower extremities is negative for DVT ?  ?Recent hip surgery ?-Patient has area of induration in the left buttock ?-Nontender to palpation ?-X-ray of left hip was unremarkable except for soft tissue swelling ?  ?CAD s/p stent placement ?-Denies chest pain ?-Continue aspirin, statin ?-Patient's troponin was elevated on admission at 192, now downtrending to 180, 151 ?-Called and discussed with cardiology Karen. Sallyanne [name], he recommends obtaining echocardiogram.  No indication for urgent intervention.  EKG is unremarkable. ?  ?History of hypertension ?-Antihypertensive medications on hold ?-Blood pressure is stable ?  ?Microcytic anemia ?-Hemoglobin is 10.4 ?-At baseline ?  ?Diabetes mellitus type 2 ?-Continue patient on Lantus ?-Continue sliding scale insulin NovoLog ?  ?CKD stage III  ?-Creatinine 1.60 ?-At baseline ? ?Macrocytic anemia ?-MCV is 104.2 ?-B12 174 ?-We will start B12 injection 1000 mcg IV daily , while patient is in the hospital ? ? ? ?Medications ? ?  ? aspirin  325 mg  Oral Q breakfast  ? atorvastatin  40 mg Oral QHS  ? insulin aspart  0-9 Units Subcutaneous TID WC  ? insulin glargine-yfgn  5 Units Subcutaneous Daily  ? mirtazapine  15 mg Oral QHS  ? QUEtiapine  200 mg Oral QHS  ? ? ? Data Reviewed:  ? ?CBG: ? ?Recent Labs  ?Lab 04/26/21 ?3419 04/26/21 ?1125 04/26/21 ?1627 04/26/21 ?2013 04/27/21 ?0734  ?GLUCAP 241* 210* 223* 176* 262*  ? ? ?SpO2: 100 % ?O2 Flow Rate (L/min): 2 L/min  ? ? ?Vitals:  ? 04/26/21 1017 04/26/21 1414 04/26/21 2014 04/27/21 0529  ?BP: 139/70 127/69 125/71 124/73  ?Pulse: 97 96 97 99  ?Resp: '16 16 20 18  '$ ?Temp: 98 ?F (36.7 ?C) 98.9 ?F (37.2 ?C) 97.8 ?F (36.6 ?C) 97.8 ?F (36.6 ?C)  ?TempSrc: Oral Oral Oral Oral  ?SpO2: 100% 100% 100% 100%  ?Weight:      ?Height:      ? ? ? ? ?Data Reviewed: ? ?Basic Metabolic Panel: ?Recent Labs  ?Lab 04/25/21 ?2257 04/26/21 ?0313  ?NA 133* 132*  ?K 4.7 4.9  ?CL 101 104  ?CO2 20* 17*  ?GLUCOSE 357* 318*  ?BUN 39* 36*  ?CREATININE 1.75* 1.60*  ?CALCIUM 9.1 8.5*  ? ? ?CBC: ?Recent Labs  ?Lab 04/25/21 ?2257 04/26/21 ?0313  ?WBC 9.8 7.6  ?NEUTROABS 8.0*  --   ?HGB 11.2* 10.4*  ?HCT 34.2* 32.0*  ?MCV 103.3* 104.2*  ?PLT 262 252  ? ? ?LFT ?Recent Labs  ?Lab 04/25/21 ?2257  ?AST 24  ?  ALT 19  ?ALKPHOS 74  ?BILITOT 0.4  ?PROT 6.8  ?ALBUMIN 3.3*  ? ?  ?Antibiotics: ?Anti-infectives (From admission, onward)  ? ? None  ? ?  ? ? ? ?DVT prophylaxis: Lovenox ? ?Code Status: Full code ? ?Family Communication: No family at bedside ? ? ?CONSULTS  ? ? ?Objective  ? ? ?Physical Examination: ? ? ?General-appears in no acute distress ?Heart-S1-S2, regular, no murmur auscultated ?Lungs-bibasilar crackles auscultated ?Abdomen-soft, nontender, no organomegaly ?Extremities-no edema in the lower extremities ?Neuro-alert, oriented x3, no focal deficit noted ? ?Status is: Inpatient: Acute hypoxemic respiratory failure ? ? ? ?  ? ? ? ?Karen [name] ?  ?[facility] ?If 7PM-7AM, please contact night-coverage at [email or web address], ?Office   [phone] ? ? ?04/27/2021, 11:50 AM  LOS: 0 days  ? ? ? ? ? ? ? ? ? ? ?  ?

## 2021-04-28 DIAGNOSIS — I5042 Chronic combined systolic (congestive) and diastolic (congestive) heart failure: Secondary | ICD-10-CM

## 2021-04-28 DIAGNOSIS — N1831 Chronic kidney disease, stage 3a: Secondary | ICD-10-CM

## 2021-04-28 DIAGNOSIS — R0602 Shortness of breath: Secondary | ICD-10-CM | POA: Diagnosis not present

## 2021-04-28 DIAGNOSIS — I5032 Chronic diastolic (congestive) heart failure: Secondary | ICD-10-CM

## 2021-04-28 DIAGNOSIS — R931 Abnormal findings on diagnostic imaging of heart and coronary circulation: Secondary | ICD-10-CM

## 2021-04-28 DIAGNOSIS — R7989 Other specified abnormal findings of blood chemistry: Secondary | ICD-10-CM

## 2021-04-28 DIAGNOSIS — N1832 Chronic kidney disease, stage 3b: Secondary | ICD-10-CM

## 2021-04-28 DIAGNOSIS — I2510 Atherosclerotic heart disease of native coronary artery without angina pectoris: Secondary | ICD-10-CM | POA: Diagnosis not present

## 2021-04-28 LAB — GLUCOSE, CAPILLARY
Glucose-Capillary: 272 mg/dL — ABNORMAL HIGH (ref 70–99)
Glucose-Capillary: 277 mg/dL — ABNORMAL HIGH (ref 70–99)
Glucose-Capillary: 165 mg/dL — ABNORMAL HIGH (ref 70–99)
Glucose-Capillary: 152 mg/dL — ABNORMAL HIGH (ref 70–99)

## 2021-04-28 MED ORDER — METOPROLOL SUCCINATE ER 50 MG PO TB24
50.0000 mg | ORAL_TABLET | Freq: Every day | ORAL | Status: DC
  Administered 2021-04-28 – 2021-05-01 (×4): 50 mg via ORAL
  Filled 2021-04-28 (×4): qty 1

## 2021-04-28 MED ORDER — AMOXICILLIN-POT CLAVULANATE 500-125 MG PO TABS
1.0000 | ORAL_TABLET | Freq: Two times a day (BID) | ORAL | Status: DC
  Administered 2021-04-28 – 2021-05-01 (×6): 500 mg via ORAL
  Filled 2021-04-28 (×7): qty 1

## 2021-04-28 MED ORDER — INSULIN GLARGINE-YFGN 100 UNIT/ML ~~LOC~~ SOLN
5.0000 [IU] | Freq: Two times a day (BID) | SUBCUTANEOUS | Status: DC
  Administered 2021-04-28 – 2021-04-29 (×3): 5 [IU] via SUBCUTANEOUS
  Filled 2021-04-28 (×4): qty 0.05

## 2021-04-28 MED ORDER — AMOXICILLIN-POT CLAVULANATE 500-125 MG PO TABS
1.0000 | ORAL_TABLET | Freq: Two times a day (BID) | ORAL | Status: DC
Start: 2021-04-28 — End: 2021-04-28
  Filled 2021-04-28: qty 1

## 2021-04-28 MED ORDER — INSULIN ASPART 100 UNIT/ML IJ SOLN
0.0000 [IU] | Freq: Three times a day (TID) | INTRAMUSCULAR | Status: DC
  Administered 2021-04-28: 3 [IU] via SUBCUTANEOUS
  Administered 2021-04-28: 8 [IU] via SUBCUTANEOUS

## 2021-04-28 MED ORDER — FUROSEMIDE 10 MG/ML IJ SOLN
20.0000 mg | Freq: Once | INTRAMUSCULAR | Status: AC
Start: 2021-04-28 — End: 2021-04-28
  Administered 2021-04-28: 20 mg via INTRAVENOUS
  Filled 2021-04-28: qty 2

## 2021-04-28 MED ORDER — SENNOSIDES-DOCUSATE SODIUM 8.6-50 MG PO TABS
1.0000 | ORAL_TABLET | Freq: Every evening | ORAL | Status: DC | PRN
  Administered 2021-04-29 – 2021-04-30 (×2): 1 via ORAL
  Filled 2021-04-28 (×3): qty 1

## 2021-04-28 NOTE — Progress Notes (Signed)
PHARMACY NOTE:  ANTIMICROBIAL RENAL DOSAGE ADJUSTMENT ? ?Current antimicrobial regimen includes a mismatch between antimicrobial dosage and estimated renal function.  As per policy approved by the Pharmacy & Therapeutics and Medical Executive Committees, the antimicrobial dosage will be adjusted accordingly. ? ?Current antimicrobial dosage:  augmentin 875 mg q12h ? ?Indication:  left perianal infection ? ?Renal Function: ? ?Estimated Creatinine Clearance: 22 mL/min (A) (by C-G formula based on SCr of 1.6 mg/dL (H)). ?'[]'$      On intermittent HD, scheduled: ?'[]'$      On CRRT ?   ?Antimicrobial dosage has been changed to:  augmentin 500 mg q12h for crcl <30 ? ? ? ?Thank you for allowing pharmacy to be a part of this patient's care. ? Lynelle Doctor, RPH ?04/28/2021 4:53 PM ?

## 2021-04-28 NOTE — Consult Note (Addendum)
?Cardiology Consultation:  ? ?Patient ID: Karen [name] ?MRN: [id number]; DOB: 05/01/1928 ? ?Admit date: 04/25/2021 ?Date of Consult: 04/28/2021 ? ?PCP:  [name], DO ?  ?[facility] Prov[facility] ?Cardiologist:  Sanda Klein, MD   { ?  ?Patient Profile:  ? ?Karen [name] is a 88 y.o. female with a hx of coronary artery disease, chronic diastolic heart failure, hypertension, DM and an ascending aortic aneurysm (stable at 4.8cm on 07/2020) who is being seen 04/28/2021 for the evaluation of Elevated troponin and abnormal echo at the request of Dr. Darrick Meigs. ? ?She had a LHC in 04/2013 with 30-40% dLM stenosis, 40% pLAD stenosis, 90% m-dLAD stenosis, 70-90% m-dLCx stenosis, 40-50% mRcA stenosis, and 20-80% PDA stenosis with EF 50%. She subsequently underwent PCI/DES to LAD and LCx 10/2013.  ? ?Echo 04/2020 with LVEF of 60-65%, moderate mitral stenosis, RVSP 28.4 mm Hg.  ? ?History of Present Illness:  ? ?Karen [name] underwent left total hip arthoplasty revision 3/2 for left prosthetic hip dislocation. Discharged home on 3/4 on ASA as DVT prophylaxis. Complicated by hematoma and need to drain on 3/14.  ? ?She presented 3/18 with acute on shortness of breath. No chest pain or palpitations. Unable to lay flat. There was concern for PE but CTA done due to elevated Scr and GFR of 27. No PE on VQ scan.  She reported 2 months hx of DOE but no chest pain. Not similar to prior angina.  ? ?Troponin 146>>151 on admit.  ?Vita B12 174 ?Hgb A1c 9.1 ?Scr 1.75>>1.6 (no check in past 2 days)  - seems baseline in 1.7 range this year ? ?Echo yesterday showed mildly reduce LVEF at 50-55% with regional WM abnormality. No evidence of mitral stenosis. Cardiology is asked for further evaluation.  ? ?Past Medical History:  ?Diagnosis Date  ? Arthritis   ? CHF (congestive heart failure) ([place])   ? Constipation   ? Coronary artery disease 04/2013  ? Three-vessel disease, initial medical therapy then stenting of the LAD and CFX 10/2013  ?  Diabetes mellitus   ? TYPE 2  ? Fibromyalgia   ? H/O: gout   ? Heart murmur   ? Hx: UTI (urinary tract infection)   ? Hypertension   ? ? ?Past Surgical History:  ?Procedure Laterality Date  ? APPENDECTOMY  1951  ? [place]  ? CARDIAC CATHETERIZATION  04/2013  ? Left main 30-40%, prox LAD 40%, mid/distal LAD 90%, CFX 70%, OM 3 90%, RCA 40-50%, PDA 20-80%, EF 50%   ? CHOLECYSTECTOMY  1951  ? Closed reduction of Left Total Hip  2008  ? x 3  ? CORONARY STENT PLACEMENT  10/11/2013  ? 2.25 x 20 mm Promus DES to mid/distal LAD & 3.5 x 12 mm Promus DES Mid CX         DR [place]  ? [place]  ? JOINT REPLACEMENT  left hip-2007, left knee-2000, right knee-1997  ? Left Hip, Bilateral Knee replacements  ? KNEE SURGERY  1997/2000  ? LEFT HEART CATHETERIZATION WITH CORONARY ANGIOGRAM N/A 04/25/2013  ? Procedure: LEFT HEART CATHETERIZATION WITH CORONARY ANGIOGRAM;  Surgeon: Sanda Klein, MD;  Location[facility] CATH LAB;  Service: Cardiovascular;  Laterality: N/A;  ? PERCUTANEOUS CORONARY STENT INTERVENTION (PCI-S) N/A 10/11/2013  ? Procedure: PERCUTANEOUS CORONARY STENT INTERVENTION (PCI-S);  Surgeon: Peter M [place]ique, MD;  Location: [facility] CATH LAB;  Service: Cardiovascular;  Laterality: N/A;  ? REPLACEMENT TOTAL HIP W/  RESURFACING IMPLANTS  1601,0932  ?  Right Foot Surgery  2004  ? x 2  ? Salivary Gland Stone Extraction    ? SPINAL FUSION  2006  ? TOTAL HIP ARTHROPLASTY Right 05/14/2020  ? Procedure: TOTAL HIP ARTHROPLASTY ANTERIOR APPROACH;  Surgeon: Gaynelle Arabian, MD;  Location: [facility] ORS;  Service: Orthopedics;  Laterality: Right;  175mn  ? TOTAL HIP REVISION Left 04/08/2021  ? Procedure: TOTAL HIP REVISION;  Surgeon: AGaynelle Arabian MD;  Location: [facility] ORS;  Service: Orthopedics;  Laterality: Left;  ?  ? ?Inpatient Medications: ?Scheduled Meds: ? aspirin  325 mg Oral Q breakfast  ? atorvastatin  40 mg Oral QHS  ? cyanocobalamin  1,000 mcg Intramuscular Daily  ? insulin aspart  0-15 Units Subcutaneous TID WC  ? insulin  glargine-yfgn  5 Units Subcutaneous Daily  ? mirtazapine  15 mg Oral QHS  ? QUEtiapine  200 mg Oral QHS  ? ?Continuous Infusions: ? ?PRN Meds: ?acetaminophen **OR** acetaminophen, hydrALAZINE, HYDROcodone-acetaminophen, lip balm, senna-docusate ? ?Allergies:    ?Allergies  ?Allergen Reactions  ? Lisinopril Cough  ?  Ace inhibitor  ? Pneumococcal Vaccines Swelling and Other (See Comments)  ?  Very bad pain and swelling in the arm of the shot, needed 2 cortisone shots.  ? Antihistamines, Diphenhydramine-Type Other (See Comments)  ? Carvedilol Other (See Comments)  ?  Weakness, dizziness, upset stomach, trembling   ? Codeine Nausea And Vomiting  ? Morphine And Related Nausea And Vomiting  ? Ultram [Tramadol Hcl] Itching  ? ? ?Social History:   ?Social History  ? ?Socioeconomic History  ? Marital status: Widowed  ?  Spouse name: Not on file  ? Number of children: Not on file  ? Years of education: Not on file  ? Highest education level: Not on file  ?Occupational History  ? Not on file  ?Tobacco Use  ? Smoking status: Never  ? Smokeless tobacco: Never  ?Vaping Use  ? Vaping Use: Never used  ?Substance and Sexual Activity  ? Alcohol use: No  ? Drug use: No  ? Sexual activity: Never  ?Other Topics Concern  ? Not on file  ?Social History Narrative  ? Not on file  ? ?Social Determinants of Health  ? ?Financial Resource Strain: Not on file  ?Food Insecurity: Not on file  ?Transportation Needs: Not on file  ?Physical Activity: Not on file  ?Stress: Not on file  ?Social Connections: Not on file  ?Intimate Partner Violence: Not on file  ?  ?Family History:   ? ?Family History  ?Problem Relation Age of Onset  ? Cancer Mother   ? Heart attack Father   ? Stroke Father   ? Heart attack Brother   ? Cancer Brother   ?  ? ?ROS:  ?Please see the history of present illness.  ?All other ROS reviewed and negative.    ? ?Physical Exam/Data:  ? ?Vitals:  ? 04/27/21 0529 04/27/21 1209 04/27/21 2032 04/28/21 0432  ?BP: 124/73 124/77 (!)  151/70 114/64  ?Pulse: 99 (!) 105 92 (!) 109  ?Resp: '18 18 20 18  '$ ?Temp: 97.8 ?F (36.6 ?C) 98.3 ?F (36.8 ?C) 98.8 ?F (37.1 ?C) 97.6 ?F (36.4 ?C)  ?TempSrc: Oral  Oral Oral  ?SpO2: 100% 100% 100% 93%  ?Weight:      ?Height:      ? ? ?Intake/Output Summary (Last 24 hours) at 04/28/2021 1111 ?Last data filed at 04/27/2021 2200 ?Gross per 24 hour  ?Intake 600 ml  ?Output --  ?Net 600 ml  ? ?  Last 3 Weights 04/26/2021 04/25/2021 04/08/2021  ?Weight (lbs) 147 lb 0.8 oz 139 lb 12.4 oz 140 lb  ?Weight (kg) 66.7 kg 63.4 kg 63.504 kg  ?   ?Body mass index is 25.24 kg/m?.  ?General:  Well nourished, well developed, in no acute distress ?HEENT: normal ?Neck: no JVD ?Vascular: No carotid bruits; Distal pulses 2+ bilaterally ?Cardiac:  normal S1, S2; RRR; Soft murmur  ?Lungs:  clear to auscultation bilaterally, no wheezing, rhonchi or rales  ?Abd: soft, nontender, no hepatomegaly  ?Ext: trace LE edema ?Musculoskeletal:  No deformities, BUE and BLE strength normal and equal ?Skin: warm and dry  ?Neuro:  CNs 2-12 intact, no focal abnormalities noted ?Psych:  Normal affect  ? ?EKG:  The EKG was personally reviewed and demonstrates:  SR at 93 bpm ?Telemetry:  Telemetry was personally reviewed and demonstrates:  Sinus rhythm/tachycardia 90-100s ? ?Relevant CV Studies: ? ?Echo 04/11/2020 ?1. Left ventricular ejection fraction, by estimation, is 60 to 65%. The  ?left ventricle has normal function. The left ventricle has no regional  ?wall motion abnormalities. There is severe asymmetric left ventricular  ?hypertrophy of the basal-septal  ?segment. Left ventricular diastolic parameters are indeterminate.  ? 2. Right ventricular systolic function is normal. The right ventricular  ?size is normal. There is normal pulmonary artery systolic pressure. The  ?estimated right ventricular systolic pressure is 16.1 mmHg.  ? 3. The mitral valve is abnormal. Trivial mitral valve regurgitation.  ?Moderate mitral stenosis. Severe mitral annular calcification.  MG 6mHg at  ?HR 82 bpm, MVA 1.1 cm^2 by continuity equation  ? 4. The aortic valve is tricuspid. Aortic valve regurgitation is mild.  ?Mild to moderate aortic valve sclerosis/calcification is present, without  ?any

## 2021-04-28 NOTE — Evaluation (Signed)
Physical Therapy Evaluation ?Patient Details ?Name: Karen [name] ?MRN: [id number] ?DOB: 1928/05/13 ?Today's Date: 04/28/2021 ? ?History of Present Illness ? 88 yo female admitted with dyspnea, CHF, increased troponin. Hx of L hip reduction with acetabular revision 04/2021, CHF, CAD, fibromyalgia, R THA, TKA, falls, CKD, gout  ?Clinical Impression ? On eval, pt was Supv level for mobility in room. She walked to and from the bathroom with a RW. O2 92% on RA during session. Will plan to follow and progress activity as tolerated. PT recommendation is for HHPT f/u.    ?   ? ?Recommendations for follow up therapy are one component of a multi-disciplinary discharge planning process, led by the attending physician.  Recommendations may be updated based on patient status, additional functional criteria and insurance authorization. ? ?Follow Up Recommendations Home health PT ? ?  ?Assistance Recommended at Discharge Intermittent Supervision/Assistance  ?Patient can return home with the following ? A little help with walking and/or transfers;A little help with bathing/dressing/bathroom;Help with stairs or ramp for entrance;Assist for transportation;Assistance with cooking/housework ? ?  ?Equipment Recommendations None recommended by PT  ?Recommendations for Other Services ?    ?  ?Functional Status Assessment Patient has had a recent decline in their functional status and demonstrates the ability to make significant improvements in function in a reasonable and predictable amount of time.  ? ?  ?Precautions / Restrictions Precautions ?Precautions: Posterior Hip;Fall ?Precaution Comments: posterior hip precuations 04/08/21 after hip revision ?Restrictions ?Weight Bearing Restrictions: No ?LLE Weight Bearing: Weight bearing as tolerated  ? ?  ? ?Mobility ? Bed Mobility ?  ?  ?  ?  ?  ?  ?  ?General bed mobility comments: oob in recliner ?  ? ?Transfers ?Overall transfer level: Needs assistance ?Equipment used: Rolling walker (2  wheels) ?Transfers: Sit to/from Stand ?Sit to Stand: Supervision ?  ?  ?  ?  ?  ?  ?  ? ?Ambulation/Gait ?Ambulation/Gait assistance: Supervision ?Gait Distance (Feet): 15 Feet (x2) ?Assistive device: Rolling walker (2 wheels) ?Gait Pattern/deviations: Step-through pattern, Decreased stride length, Antalgic ?  ?  ?  ?General Gait Details: Gait mildly antalgic 2* L hip soreness. Pt denied dizziness. O2 92% on RA. She tolerated distance well. ? ?Stairs ?  ?  ?  ?  ?  ? ?Wheelchair Mobility ?  ? ?Modified Rankin (Stroke Patients Only) ?  ? ?  ? ?Balance Overall balance assessment: Needs assistance, History of Falls ?  ?  ?  ?  ?Standing balance support: During functional activity, Bilateral upper extremity supported, Reliant on assistive device for balance ?Standing balance-Leahy Scale: Fair ?  ?  ?  ?  ?  ?  ?  ?  ?  ?  ?  ?  ?   ? ? ? ?Pertinent Vitals/Pain Pain Assessment ?Pain Assessment: Faces ?Faces Pain Scale: Hurts little more ?Pain Location: L hip ?Pain Descriptors / Indicators: Discomfort, Sore ?Pain Intervention(s): Limited activity within patient's tolerance, Monitored during session, Repositioned  ? ? ?Home Living Family/patient expects to be discharged to:: Private residence ?Living Arrangements: Children ?Available Help at Discharge: Family;Available PRN/intermittently ?Type of Home: House ?Home Access: Stairs to enter ?Entrance Stairs-Rails: Left ?Entrance Stairs-Number of Steps: 4 ?  ?Home Layout: One level ?Home Equipment: Rolling Walker (2 wheels);BSC/3in1;Cane - single point ?Additional Comments: pt lives with adult son and otehr son flies  in from [place] to stay for 1  weeks.  ?  ?Prior Function Prior Level of Function : Independent/Modified  Independent ?  ?  ?  ?  ?  ?  ?Mobility Comments: uses a RW , does not drive ?  ?  ? ? ?Hand Dominance  ?   ? ?  ?Extremity/Trunk Assessment  ? Upper Extremity Assessment ?Upper Extremity Assessment: Overall WFL for tasks assessed ?  ? ?Lower Extremity  Assessment ?Lower Extremity Assessment: Generalized weakness ?  ? ?Cervical / Trunk Assessment ?Cervical / Trunk Assessment: Normal  ?Communication  ? Communication: HOH  ?Cognition Arousal/Alertness: Awake/alert ?Behavior During Therapy: [facility] for tasks assessed/performed ?Overall Cognitive Status: Within Functional Limits for tasks assessed ?  ?  ?  ?  ?  ?  ?  ?  ?  ?  ?  ?  ?  ?  ?  ?  ?  ?  ?  ? ?  ?General Comments   ? ?  ?Exercises    ? ?Assessment/Plan  ?  ?PT Assessment Patient needs continued PT services  ?PT Problem List Decreased strength;Decreased mobility;Decreased safety awareness;Decreased activity tolerance;Decreased knowledge of use of DME;Pain ? ?   ?  ?PT Treatment Interventions DME instruction;Gait training;Therapeutic exercise;Balance training;Functional mobility training;Therapeutic activities;Patient/family education   ? ?PT Goals (Current goals can be found in the Care Plan section)  ?Acute Rehab PT Goals ?Patient Stated Goal: regain PLOF/independence. ?PT Goal Formulation: With patient/family ?Time For Goal Achievement: 05/12/21 ?Potential to Achieve Goals: Good ? ?  ?Frequency Min 3X/week ?  ? ? ?Co-evaluation   ?  ?  ?  ?  ? ? ?  ?AM-PAC PT "6 Clicks" Mobility  ?Outcome Measure Help needed turning from your back to your side while in a flat bed without using bedrails?: A Little ?Help needed moving from lying on your back to sitting on the side of a flat bed without using bedrails?: A Little ?Help needed moving to and from a bed to a chair (including a wheelchair)?: A Little ?Help needed standing up from a chair using your arms (e.g., wheelchair or bedside chair)?: A Little ?Help needed to walk in hospital room?: A Little ?Help needed climbing 3-5 steps with a railing? : A Little ?6 Click Score: 18 ? ?  ?End of Session Equipment Utilized During Treatment: Gait belt ?Activity Tolerance: Patient limited by fatigue ?Patient left: in chair;with call bell/phone within reach;with family/visitor  present ?  ?PT Visit Diagnosis: Unsteadiness on feet (R26.81);Muscle weakness (generalized) (M62.81);Difficulty in walking, not elsewhere classified (R26.2);History of falling (Z91.81);Pain ?Pain - Right/Left: Left ?Pain - part of body: Hip ?  ? ?Time: 3235-5732 ?PT Time Calculation (min) (ACUTE ONLY): 22 min ? ? ?Charges:   PT Evaluation ?$PT Eval Moderate Complexity: 1 Mod ?  ?  ?   ? ? ? ? ?Malicia Blasdel P, PT ?Acute Rehabilitation  ?Office: [phone] ?Pager: [phone] ? ?  ? ?

## 2021-04-28 NOTE — Progress Notes (Signed)
RN told me that patient complains of pain around her anus when she had bowel movement.  Examined patient has large area of induration with tenderness palpation in the left perianal area.  Called general surgery, Dr. Leighton Ruff.  She recommends starting antibiotics and they will see patient in a.m.  We will start patient on Augmentin 1 tablet p.o. twice daily. ?

## 2021-04-28 NOTE — Progress Notes (Signed)
Inpatient Diabetes Program Recommendations ? ?AACE/ADA: New Consensus Statement on Inpatient Glycemic Control  ?Target Ranges:  Prepandial:   less than 140 mg/dL ?     Peak postprandial:   less than 180 mg/dL (1-2 hours) ?     Critically ill patients:  140 - 180 mg/dL  ? ? Latest Reference Range & Units 04/28/21 07:39 04/28/21 11:23  ?Glucose-Capillary 70 - 99 mg/dL 272 (H) 277 (H)  ? ?Review of Glycemic Control ? ?Diabetes history: DM2 ?Outpatient Diabetes medications: Glipizide 10 mg daily, Metformin 1000 mg BID ?Current orders for Inpatient glycemic control: Semglee 5 units daily, Novolog 0-15 units TID with meals ? ?Inpatient Diabetes Program Recommendations:   ? ?Insulin: Please consider increasing Semglee to 8 units daily, adding Novolog 0-5 units QHS, and ordering Novolog 3 units TID with meals for meal coverage if patient eats at least 50% of meals. ? ?Thanks, ?Barnie Alderman, RN, MSN, CDE ?Diabetes Coordinator ?Inpatient Diabetes Program ?[phone] (Team Pager from 8am to 5pm) ? ? ?

## 2021-04-28 NOTE — Progress Notes (Signed)
[facility] ? ?PROGRESS NOTE ? ?Karen [name] GYJ:[id number] DOB: 03-15-1928 DOA: 04/25/2021 ?PCP: [name], DO ? ? ?Brief HPI:   ?88 year old female with history of CAD s/p stenting, CHF, hypertension, CKD stage III, diabetes mellitus presented with shortness of breath.  Patient had recent left hip surgery for hip fracture and sees Dr. Reynaldo Minium.  She recently had Dr Maureen Ralphs and had fluid drained from the left buttock which was bloody.  There was a concern for PE, patient also had swelling of the left lower extremity.  CTA could not be obtained due to low GFR.  VQ scan ordered. ?  ?Subjective  ? ?Patient seen and examined, breathing has improved after IV Lasix was given yesterday.  She is no longer requiring oxygen.  Now has worsening swelling of left hip. ? ? Assessment/Plan:  ? ? ? ?Dyspnea on exertion ?-Improved with IV Lasix, initially she was requiring 2 L/min of oxygen via nasal cannula ?-VQ scan is negative ?-Troponin was elevated at 180, now downtrending to 151 ?-Echocardiogram shows distal septal hypokinesis, new wall motion abnormality ?-We will consult cardiology ?-Venous duplex of lower extremities is negative for DVT ?  ?Recent hip surgery ?-Patient has area of induration in the left buttock ?-Nontender to palpation ?-X-ray of left hip was unremarkable except for soft tissue swelling ?-Called and discussed with Dr. Maureen Ralphs, he will see patient tomorrow ?  ?CAD s/p stent placement ?-Denies chest pain ?-Continue aspirin, statin ?-Patient's troponin was elevated on admission at 192, now downtrending to 180, 151 ?-Called and discussed with cardiology Dr. Sallyanne Kuster, he recommends obtaining echocardiogram.  No indication for urgent intervention.  EKG is unremarkable. ?-Echocardiogram shows new wall motion abnormality ?-Cardiology consulted ?  ?History of hypertension ?-Antihypertensive medications on hold ?-Blood pressure is stable ?  ?Microcytic anemia ?-Hemoglobin is 10.4 ?-At baseline ?  ?Diabetes  mellitus type 2 ?-Continue patient on Lantus ?-Continue sliding scale insulin NovoLog ?-CBG has been elevated ?-Change Lantus to 5 units subcu twice daily ?  ?CKD stage III  ?-Creatinine 1.60 ?-At baseline ? ?Macrocytic anemia ?-MCV is 104.2 ?-B12 174 ?-Patient started on  B12 injection 1000 mcg IV daily , while patient is in the hospital ? ? ? ?Medications ? ?  ? aspirin  325 mg Oral Q breakfast  ? atorvastatin  40 mg Oral QHS  ? cyanocobalamin  1,000 mcg Intramuscular Daily  ? insulin aspart  0-15 Units Subcutaneous TID WC  ? insulin glargine-yfgn  5 Units Subcutaneous Daily  ? metoprolol succinate  50 mg Oral Daily  ? mirtazapine  15 mg Oral QHS  ? QUEtiapine  200 mg Oral QHS  ? ? ? Data Reviewed:  ? ?CBG: ? ?Recent Labs  ?Lab 04/27/21 ?1201 04/27/21 ?1613 04/27/21 ?2029 04/28/21 ?0739 04/28/21 ?1123  ?GLUCAP 258* 219* 257* 272* 277*  ? ? ?SpO2: 100 % ?O2 Flow Rate (L/min): 2 L/min  ? ? ?Vitals:  ? 04/27/21 1209 04/27/21 2032 04/28/21 0432 04/28/21 1232  ?BP: 124/77 (!) 151/70 114/64 (!) 150/76  ?Pulse: (!) 105 92 (!) 109 97  ?Resp: '18 20 18 16  '$ ?Temp: 98.3 ?F (36.8 ?C) 98.8 ?F (37.1 ?C) 97.6 ?F (36.4 ?C) (!) 97.5 ?F (36.4 ?C)  ?TempSrc:  Oral Oral   ?SpO2: 100% 100% 93% 100%  ?Weight:      ?Height:      ? ? ? ? ?Data Reviewed: ? ?Basic Metabolic Panel: ?Recent Labs  ?Lab 04/25/21 ?2257 04/26/21 ?0313  ?NA 133* 132*  ?K 4.7  4.9  ?CL 101 104  ?CO2 20* 17*  ?GLUCOSE 357* 318*  ?BUN 39* 36*  ?CREATININE 1.75* 1.60*  ?CALCIUM 9.1 8.5*  ? ? ?CBC: ?Recent Labs  ?Lab 04/25/21 ?2257 04/26/21 ?0313  ?WBC 9.8 7.6  ?NEUTROABS 8.0*  --   ?HGB 11.2* 10.4*  ?HCT 34.2* 32.0*  ?MCV 103.3* 104.2*  ?PLT 262 252  ? ? ?LFT ?Recent Labs  ?Lab 04/25/21 ?2257  ?AST 24  ?ALT 19  ?ALKPHOS 74  ?BILITOT 0.4  ?PROT 6.8  ?ALBUMIN 3.3*  ? ?  ?Antibiotics: ?Anti-infectives (From admission, onward)  ? ? None  ? ?  ? ? ? ?DVT prophylaxis: Lovenox ? ?Code Status: Full code ? ?Family Communication: No family at bedside ? ? ?CONSULTS   ? ? ?Objective  ? ? ?Physical Examination: ? ?General-appears in no acute distress ?Heart-S1-S2, regular, no murmur auscultated ?Lungs-clear to auscultation bilaterally, no wheezing or crackles auscultated ?Abdomen-soft, nontender, no organomegaly ?Extremities-no edema in the lower extremities ?Left buttock-edematous with mild induration to palpation ?Neuro-alert, oriented x3, no focal deficit noted ? ?Status is: Inpatient: Acute hypoxemic respiratory failure ? ? ? ?  ? ? ?[name] ?  ?[facility] ?If 7PM-7AM, please contact night-coverage at [email or web address], ?Office  [phone] ? ? ?04/28/2021, 3:56 PM  LOS: 1 day  ? ? ? ? ? ? ? ? ? ? ?  ?

## 2021-04-29 DIAGNOSIS — D539 Nutritional anemia, unspecified: Secondary | ICD-10-CM

## 2021-04-29 DIAGNOSIS — R778 Other specified abnormalities of plasma proteins: Secondary | ICD-10-CM | POA: Diagnosis not present

## 2021-04-29 DIAGNOSIS — R0602 Shortness of breath: Secondary | ICD-10-CM | POA: Diagnosis not present

## 2021-04-29 DIAGNOSIS — J9601 Acute respiratory failure with hypoxia: Secondary | ICD-10-CM | POA: Diagnosis not present

## 2021-04-29 DIAGNOSIS — N1831 Chronic kidney disease, stage 3a: Secondary | ICD-10-CM | POA: Diagnosis not present

## 2021-04-29 LAB — CBC WITH DIFFERENTIAL/PLATELET
Abs Immature Granulocytes: 0.03 10*3/uL (ref 0.00–0.07)
Basophils Absolute: 0 10*3/uL (ref 0.0–0.1)
Basophils Relative: 1 %
Eosinophils Absolute: 0.2 10*3/uL (ref 0.0–0.5)
Eosinophils Relative: 3 %
HCT: 31.4 % — ABNORMAL LOW (ref 36.0–46.0)
Hemoglobin: 10.5 g/dL — ABNORMAL LOW (ref 12.0–15.0)
Immature Granulocytes: 0 %
Lymphocytes Relative: 12 %
Lymphs Abs: 0.9 10*3/uL (ref 0.7–4.0)
MCH: 34.1 pg — ABNORMAL HIGH (ref 26.0–34.0)
MCHC: 33.4 g/dL (ref 30.0–36.0)
MCV: 101.9 fL — ABNORMAL HIGH (ref 80.0–100.0)
Monocytes Absolute: 0.8 10*3/uL (ref 0.1–1.0)
Monocytes Relative: 10 %
Neutro Abs: 5.8 10*3/uL (ref 1.7–7.7)
Neutrophils Relative %: 74 %
Platelets: 262 10*3/uL (ref 150–400)
RBC: 3.08 MIL/uL — ABNORMAL LOW (ref 3.87–5.11)
RDW: 15.2 % (ref 11.5–15.5)
WBC: 7.8 10*3/uL (ref 4.0–10.5)
nRBC: 0 % (ref 0.0–0.2)

## 2021-04-29 LAB — GLUCOSE, CAPILLARY
Glucose-Capillary: 165 mg/dL — ABNORMAL HIGH (ref 70–99)
Glucose-Capillary: 282 mg/dL — ABNORMAL HIGH (ref 70–99)
Glucose-Capillary: 241 mg/dL — ABNORMAL HIGH (ref 70–99)
Glucose-Capillary: 245 mg/dL — ABNORMAL HIGH (ref 70–99)

## 2021-04-29 MED ORDER — NITROGLYCERIN 0.4 MG SL SUBL: 0.4000 mg | SUBLINGUAL_TABLET | SUBLINGUAL | Status: DC | PRN

## 2021-04-29 MED ORDER — ASPIRIN 81 MG PO CHEW
81.0000 mg | CHEWABLE_TABLET | Freq: Every day | ORAL | Status: DC
  Administered 2021-04-30 – 2021-05-01 (×2): 81 mg via ORAL
  Filled 2021-04-29 (×3): qty 1

## 2021-04-29 MED ORDER — INSULIN ASPART 100 UNIT/ML IJ SOLN
0.0000 [IU] | Freq: Three times a day (TID) | INTRAMUSCULAR | Status: DC
  Administered 2021-04-29: 5 [IU] via SUBCUTANEOUS
  Administered 2021-04-29: 3 [IU] via SUBCUTANEOUS
  Administered 2021-04-30 (×2): 5 [IU] via SUBCUTANEOUS
  Administered 2021-05-01 (×2): 2 [IU] via SUBCUTANEOUS

## 2021-04-29 MED ORDER — INSULIN ASPART 100 UNIT/ML IJ SOLN
0.0000 [IU] | Freq: Every day | INTRAMUSCULAR | Status: DC
  Administered 2021-04-29: 2 [IU] via SUBCUTANEOUS

## 2021-04-29 MED ORDER — INSULIN ASPART 100 UNIT/ML IJ SOLN
2.0000 [IU] | Freq: Three times a day (TID) | INTRAMUSCULAR | Status: DC
  Administered 2021-04-29 – 2021-04-30 (×3): 2 [IU] via SUBCUTANEOUS

## 2021-04-29 MED ORDER — BUPIVACAINE-EPINEPHRINE 0.5% -1:200000 IJ SOLN
10.0000 mL | Freq: Once | INTRAMUSCULAR | Status: DC
  Filled 2021-04-29: qty 10

## 2021-04-29 NOTE — Progress Notes (Signed)
[facility] ?PROGRESS NOTE ? ? ? ?Progress Note  ?Karen [name]  URK:[id number] DOB: Jan 06, 1929 DOA: 04/25/2021 ?PCP: [name], DO  ? ? ? ?Brief Narrative:  ? ?Karen [name] is an 88 y.o. female past medical history of CAD status post stenting, chronic diastolic heart failure, chronic kidney stage IIIb, diabetes mellitus, status post l left total hip arthroplasty revision on 04/09/2021, discharged home on aspirin for DVT prophylaxis complicated by hematoma needing drain on 04/21/2021 presents with shortness of breath unable to lay flat there is concern for an acute pulmonary emboli but CTA not done due to renal dysfunction, VQ scan showed no PE, on admission cardiac biomarkers were slightly elevated 2D echo was done that showed an EF of 50% with regional wall motion abnormalities no aortic stenosis and cardiology was consulted for evaluation. ? ?Assessment/Plan:  ? ?Elevated troponin/abnormal echo and shortness of breath: ?Cardiology was consulted recommended aspirin 325 due to recent surgery and plan for aspirin 81 mg when she has completed her DVT prophylaxis. ?They discussed with the family the need for ischemic evaluation, but the patient is planned for intervention, this may warrant delaying cardiac catheterization so she is not committed to dual antiplatelet therapy prior to procedure. ?Orthopedic surgery recommended to let this rest for a week or 2 to see if it starts to resolve. She will see me in the office on 3/28. ?Continue statins and resume beta-blockers. ?If the patient defers cardiac catheterization and would prefer medical management they recommended aspirin and Plavix 75 mg for medical management of her likely NSTEMI, her episode of shortness of breath in the setting of new regional wall motion abnormality certainly speaks for possible an ischemic event but at her age and medical management may be a reasonable strategy. ?Appreciate cardiology's assistance. ?She relate her dyspnea is  improved compared to admission she was on IV Lasix, which has now been held. Positive about 2 L ? ?Recent hip surgery with a new left hip seroma/hematoma: ?Patient has an area of induration in the left buttock nontender to palp palpation. ?X-ray of the hip was unremarkable except for soft tissue swelling. ?Discussed the case with Dr. Wynelle Link that evaluated the patient and relates there is no sign of infection and her hemoglobin has remained stable and recommended conservative management and let this rest for 2 weeks and see if it starts to resolve, to follow-up with him at the office on 05/05/2021. ? ?New pain around her anus when she has a bowel movement: ?General surgery was consulted she was started empirically on IV antibiotics. ?Tmax of 97.8, CBC with differential is pending. ? ?Essential hypertension ?Continue to hold antihypertensive medication blood pressure stable. ? ?Macrocytic anemia: ?No signs of overt bleeding her hemoglobin is stable. ?MCV 104 B12 174 continue B12 repletion. ? ?Diabetes mellitus type 2: ?Long-acting insulin plus sliding scale blood glucose fairly controlled. ? ?Chronic kidney stage IIIb: ?Creatinine at baseline 1.6 continue to follow. ? ? ?DVT prophylaxis: Lovenox ?Family Communication:son ?Status is: Inpatient ?Remains inpatient appropriate because: Shortness of breath ? ? ? ?Code Status:  ? ?  ?Code Status Orders  ?(From admission, onward)  ?  ? ? ?  ? ?  Start     Ordered  ? 04/26/21 0247  Full code  Continuous       ? 04/26/21 0248  ? ?  ?  ? ?  ? ?Code Status History   ? ? Date Active Date Inactive Code Status Order ID Comments User Context  ?  04/08/2021 2147 04/11/2021 1743 Full Code [id number]  Gaynelle Arabian, MD Inpatient  ? 05/14/2020 1635 05/15/2020 2054 Full Code [id number]  Derl Barrow, PA Inpatient  ? 10/11/2013 1528 10/12/2013 1416 Full Code [id number]  [place], Peter M, MD Inpatient  ? 04/25/2013 1050 04/25/2013 1840 Full Code [id number]  Sanda Klein, MD Inpatient  ? 04/25/2013  1050 04/25/2013 1050 Full Code [id number]  Croitoru, Dani Gobble, MD Inpatient  ? 05/16/2012 0955 05/16/2012 2119 Full Code [id number]  Leota Jacobsen, MD ED  ? 10/20/2011 0344 10/20/2011 1838 Full Code [id number]  Wandra Arthurs, MD ED  ? ?  ? ? ? ? ?IV Access:  ? ?Peripheral IV ? ? ?Procedures and diagnostic studies:  ? ?DG Chest 2 View ? ?Result Date: 04/27/2021 ?CLINICAL DATA:  A 88 year old female presents following lung scan. EXAM: CHEST - 2 VIEW COMPARISON:  April 25, 2021. FINDINGS: There is ectasia of the thoracic aorta which is similar to prior study. Signs of calcified aortic atherosclerosis also similar. EKG leads project over the chest. Lungs are clear and moderately hyperinflated. No visible pneumothorax. No sign of pleural effusion. IMPRESSION: Ectatic thoracic aorta with calcified aortic atherosclerosis, no radiographic evidence of acute cardiopulmonary disease. Electronically Signed   By: Zetta Bills M.D.   On: 04/27/2021 13:17  ? ?NM Pulmonary Perfusion ? ?Result Date: 04/27/2021 ?CLINICAL DATA:  Dyspnea on exertion. Pulmonary embolism suspected, high probability. EXAM: NUCLEAR MEDICINE PERFUSION LUNG SCAN TECHNIQUE: Perfusion images were obtained in multiple projections after intravenous injection of radiopharmaceutical. Ventilation scans intentionally deferred if perfusion scan and chest x-ray adequate for interpretation during COVID 19 epidemic. RADIOPHARMACEUTICALS:  4.4 mCi Tc-47mMAA IV COMPARISON:  Chest radiograph 04/27/2021 FINDINGS: No significant peripheral or wedge-shaped perfusion abnormalities in the lungs. Ventilation images were not obtained. IMPRESSION: No significant perfusion abnormality. No evidence to suggest pulmonary embolism. Electronically Signed   By: AMarkus DaftM.D.   On: 04/27/2021 11:57  ? ?ECHOCARDIOGRAM COMPLETE ? ?Result Date: 04/27/2021 ?   ECHOCARDIOGRAM REPORT   Patient Name:   Karen CHILTONDate of Exam: 04/27/2021 Medical Rec #:  [id number]        Height:       64.0 in  Accession #:    [id number]       Weight:       147.0 lb Date of Birth:  [birth year]        BSA:          1.717 m? Patient Age:    89years          BP:           124/73 mmHg Patient Gender: F                 HR:           95 bpm. Exam Location:  Inpatient Procedure: 2D Echo, Cardiac Doppler and Color Doppler Indications:    Dyspnea  History:        Patient has prior history of Echocardiogram examinations. CHF,                 CAD; Risk Factors:Hypertension and Diabetes.  Sonographer:    TJyl HeinzReferring Phys: 4[place] 1. Distal septal hypokinesis . Left ventricular ejection fraction, by estimation, is 50 to 55%. The left ventricle has low normal function. The left ventricle demonstrates regional wall motion abnormalities (see scoring diagram/findings for description). There is mild asymmetric left ventricular hypertrophy of  the basal and septal segments. Left ventricular diastolic parameters are indeterminate.  2. Right ventricular systolic function is normal. The right ventricular size is normal.  3. Left atrial size was mildly dilated.  4. The mitral valve is degenerative. Trivial mitral valve regurgitation. No evidence of mitral stenosis. Moderate mitral annular calcification.  5. The aortic valve is tricuspid. There is moderate calcification of the aortic valve. There is moderate thickening of the aortic valve. Aortic valve regurgitation is trivial. Aortic valve sclerosis/calcification is present, without any evidence of aortic stenosis.  6. The inferior vena cava is normal in size with greater than 50% respiratory variability, suggesting right atrial pressure of 3 mmHg. FINDINGS  Left Ventricle: Distal septal hypokinesis. Left ventricular ejection fraction, by estimation, is 50 to 55%. The left ventricle has low normal function. The left ventricle demonstrates regional wall motion abnormalities. The left ventricular internal cavity size was normal in size. There is mild asymmetric  left ventricular hypertrophy of the basal and septal segments. Left ventricular diastolic parameters are indeterminate. Right Ventricle: The right ventricular size is normal. No increase in right ventricular wall

## 2021-04-29 NOTE — Progress Notes (Signed)
? ?Progress Note ? ?Patient Name: Karen [name] ?Date of Encounter: 04/29/2021 ? ?Primary Cardiologist: Sanda Klein, MD  ? ?Subjective  ? ?Not feeling great, just had I&D of rectal abscess. ? ?Inpatient Medications  ?  ?Scheduled Meds: ? amoxicillin-clavulanate  1 tablet Oral Q12H  ? aspirin  81 mg Oral Daily  ? atorvastatin  40 mg Oral QHS  ? bupivacaine-EPINEPHrine  10 mL Infiltration Once  ? cyanocobalamin  1,000 mcg Intramuscular Daily  ? insulin aspart  0-5 Units Subcutaneous QHS  ? insulin aspart  0-9 Units Subcutaneous TID WC  ? insulin aspart  2 Units Subcutaneous TID WC  ? insulin glargine-yfgn  5 Units Subcutaneous BID  ? metoprolol succinate  50 mg Oral Daily  ? mirtazapine  15 mg Oral QHS  ? QUEtiapine  200 mg Oral QHS  ? ?Continuous Infusions: ? ?PRN Meds: ?acetaminophen **OR** acetaminophen, hydrALAZINE, HYDROcodone-acetaminophen, lip balm, nitroGLYCERIN, senna-docusate  ? ?Vital Signs  ?  ?Vitals:  ? 04/28/21 2021 04/29/21 0403 04/29/21 1054 04/29/21 1345  ?BP: 122/70 117/66 (!) 145/64 120/68  ?Pulse: 83 86 87 89  ?Resp: '16 18  16  '$ ?Temp: 97.9 ?F (36.6 ?C) (!) 97.5 ?F (36.4 ?C)  98.2 ?F (36.8 ?C)  ?TempSrc: Oral Oral  Oral  ?SpO2: 100% 100%  98%  ?Weight:      ?Height:      ? ? ?Intake/Output Summary (Last 24 hours) at 04/29/2021 1854 ?Last data filed at 04/28/2021 2200 ?Gross per 24 hour  ?Intake 120 ml  ?Output --  ?Net 120 ml  ? ?Filed Weights  ? 04/25/21 2152 04/26/21 0244  ?Weight: 63.4 kg 66.7 kg  ? ? ?Telemetry  ?  ?SR - Personally Reviewed ? ?ECG  ?  ?No new - Personally Reviewed ? ?Physical Exam  ? ?GEN: No acute distress.   ?Neck: No JVD ?Cardiac: regular rhythm, normal rate, no murmurs, rubs, or gallops.  ?Respiratory: Clear to auscultation bilaterally. ?GI: Soft, nontender, non-distended  ?MS: No edema; No deformity. ?Neuro:  Nonfocal  ?Psych: Normal affect  ? ?Labs  ?  ?Chemistry ?Recent Labs  ?Lab 04/25/21 ?2257 04/26/21 ?0313  ?NA 133* 132*  ?K 4.7 4.9  ?CL 101 104  ?CO2 20* 17*   ?GLUCOSE 357* 318*  ?BUN 39* 36*  ?CREATININE 1.75* 1.60*  ?CALCIUM 9.1 8.5*  ?PROT 6.8  --   ?ALBUMIN 3.3*  --   ?AST 24  --   ?ALT 19  --   ?ALKPHOS 74  --   ?BILITOT 0.4  --   ?GFRNONAA 27* 30*  ?ANIONGAP 12 11  ?  ? ?Hematology ?Recent Labs  ?Lab 04/25/21 ?2257 04/26/21 ?5916 04/26/21 ?3846 04/29/21 ?6599  ?WBC 9.8 7.6  --  7.8  ?RBC 3.31* 3.07* 3.11* 3.08*  ?HGB 11.2* 10.4*  --  10.5*  ?HCT 34.2* 32.0*  --  31.4*  ?MCV 103.3* 104.2*  --  101.9*  ?MCH 33.8 33.9  --  34.1*  ?MCHC 32.7 32.5  --  33.4  ?RDW 14.8 14.7  --  15.2  ?PLT 262 252  --  262  ? ? ?Cardiac EnzymesNo results for input(s): TROPONINI in the last 168 hours. No results for input(s): TROPIPOC in the last 168 hours.  ? ?BNP ?Recent Labs  ?Lab 04/25/21 ?2257  ?BNP 162.2*  ?  ? ?DDimer No results for input(s): DDIMER in the last 168 hours.  ? ?Radiology  ?  ?No results found. ? ? ?Cardiac Studies  ?Echo  ?1. Distal  septal hypokinesis . Left ventricular ejection fraction, by  ?estimation, is 50 to 55%. The left ventricle has low normal function. The  ?left ventricle demonstrates regional wall motion abnormalities (see  ?scoring diagram/findings for  ?description). There is mild asymmetric left ventricular hypertrophy of the  ?basal and septal segments. Left ventricular diastolic parameters are  ?indeterminate.  ? 2. Right ventricular systolic function is normal. The right ventricular  ?size is normal.  ? 3. Left atrial size was mildly dilated.  ? 4. The mitral valve is degenerative. Trivial mitral valve regurgitation.  ?No evidence of mitral stenosis. Moderate mitral annular calcification.  ? 5. The aortic valve is tricuspid. There is moderate calcification of the  ?aortic valve. There is moderate thickening of the aortic valve. Aortic  ?valve regurgitation is trivial. Aortic valve sclerosis/calcification is  ?present, without any evidence of  ?aortic stenosis.  ? 6. The inferior vena cava is normal in size with greater than 50%  ?respiratory  variability, suggesting right atrial pressure of 3 mmHg.  ? ?Patient Profile  ?   ?88 y.o. female coronary artery disease, chronic diastolic heart failure, hypertension, DM and an ascending aortic aneurysm (stable at 4.8cm on 07/2020) who is being seen 04/28/2021 for the evaluation of Elevated troponin and abnormal echo. ? ?Assessment & Plan  ? ?Principal Problem: ?  SOB (shortness of breath) ?Active Problems: ?  Essential hypertension ?  Chronic combined systolic and diastolic congestive heart failure ([place]) ?  CAD (coronary artery disease) ?  Elevated troponin ?  Uncontrolled type 2 diabetes mellitus with hyperglycemia ([place]) ?  CKD (chronic kidney disease) stage 3, GFR 30-59 ml/min ([facility]) ?  Macrocytic anemia ?  Acute hypoxemic respiratory failure ([place]) ?  ?Episodic dyspnea with troponin elevation and new RWMA on echo ?- would consider cardiac catheterization however patient feels she has been through a lot lately with hip surgery and now I&D of perirectal abscess. She and her son both feel that a conservative approach is what they would prefer, and a trial of going home and regaining conditioning prior to consider cardiac catheterization.  ?- She feels she is bleeding from I&D site. When this resolves, would consider aspirin 81 mg daily and plavix 75 mg daily for medical management of NSTEMI. This should be continued without interruption but would be easier to hold if needed for future procedures. Will reassess tomorrow and plan for timing of medical therapy.  ?- I do feel that her episodic dyspnea and new wall motion abnormality are likely related. She may elect to have an outpatient cath. We will arrange close follow up with cardiology to reassess quickly after discharge.  ?- continue statin and BB. ? ?   ? ?For questions or updates, please contact [place] ?Please consult [email or web address] for contact info under  ? ?  ?   ?Signed, ?Elouise Munroe, MD  ?04/29/2021, 6:54 PM   ? ?

## 2021-04-29 NOTE — Procedures (Signed)
Incision and Drainage Procedure Note ? ?Pre-operative Diagnosis: Right Perirectal abscess ? ?Post-operative Diagnosis: same ? ?Indications: 88 year old female w/ hx of diarrhea 4-5 days ago and then a lump began to evolve on her right buttock near her anus. She was found to have a right perirectal abscess ? ?Anesthesia: 0.5% bupivacaine with epi ? ?Procedure Details  ?The procedure, risks and complications have been discussed in detail with the patient, and the patient has signed consent to the procedure. I performed the procedure with my attending present.  ? ?The skin was sterilely prepped and draped over the affected area in the usual fashion. ?After adequate local anesthesia, an I&D with an #11 blade was performed on the right buttock. Incision was made into a cruciate incision and 2 corners were cut. Purulent drainage: present. Hemostats were used to break up loculations. The patient was observed until stable. A dressing was applied over the wound.  ? ?Findings: ?Right perirectal abscess  ? ?EBL: minimal ? ?Drains/Packing: None ? ?Condition: Tolerated procedure well ? ?Complications: ?none. ? ?She will start sitz baths TID and follow up with Dr.Thomas in the office in ~3 weeks. We will see prn moving forward. Would recommend 5 additional days of Augmentin.  ? ?Jillyn Ledger, PA-C ?Lead Surgery  ?04/29/21, 12:59 PM ? ?

## 2021-04-29 NOTE — Progress Notes (Signed)
Subjective: ?     I was asked to see [name] due to a recurrent hematoma/seroma in her left hip area. She is a few weeks post-op from a left acetabular revision due to injuries sustained in a fall. She had a large hip hematoma just from the fall itself. She was seen in the office last week and had the hematoma aspirated and it recurred in 2 days. She is in the hospital now for evaluation of shortness of breath. I was called to examine her left hip. She says it is uncomfortable but not causing significant pain. She has not had any redness or drainage from the hip ? ?Objective: ?Vital signs in last 24 hours: ?Temp:  [97.5 ?F (36.4 ?C)-97.9 ?F (36.6 ?C)] 97.5 ?F (36.4 ?C) (03/22 0403) ?Pulse Rate:  [83-97] 86 (03/22 0403) ?Resp:  [16-18] 18 (03/22 0403) ?BP: (117-150)/(66-76) 117/66 (03/22 0403) ?SpO2:  [100 %] 100 % (03/22 0403) ? ?Intake/Output from previous day: ?03/21 0701 - 03/22 0700 ?In: 960 [P.O.:960] ?Out: -  ?Intake/Output this shift: ?Total I/O ?In: 120 [P.O.:120] ?Out: -  ? ?No results for input(s): HGB in the last 72 hours. ?No results for input(s): WBC, RBC, HCT, PLT in the last 72 hours. ?No results for input(s): NA, K, CL, CO2, BUN, CREATININE, GLUCOSE, CALCIUM in the last 72 hours. ?No results for input(s): LABPT, INR in the last 72 hours. ? ?WD female alert and oriented in NAD ?Left hip shows a moderate sized subcutaneous fluid collection with no surrounding warmth or erythema and no drainage. She has bruising in the area ?It is smaller than it was last week when seen in the office ? ? ? ?Assessment/Plan: ?Left hip seroma/hematoma- There are no signs of infection and her hemoglobin on admission was 10 which has not dropped since discharge from the hospital, thus she is not actively bleeding. I hav told her that recurrent aspirations lead to recurrent swelling and we need to let this rest for a week or 2 to see if it starts to resolve. She will see me in the office on 3/28. She will call if it  worsens before then ? ? ? ? ?Pilar Plate Diron Haddon ?04/29/2021, 6:34 AM  ? ? ? ? ?

## 2021-04-29 NOTE — Consult Note (Signed)
CC: rectal pain  Requesting provider: Dr David Stall  HPI: Karen [name] is an 88 y.o. female who is here s/p fall at home after hip surgery earlier this month.  Being treated for COPD exacerbation and possible mild MI.  She reports a recent h/o diarrhea (~4-5 days ago) and then a "lump" that began to evolve.  Not particularly tender.  Diarrhea is better now.  Past Medical History:  Diagnosis Date   Arthritis    CHF (congestive heart failure) (HCC)    Constipation    Coronary artery disease 04/2013   Three-vessel disease, initial medical therapy then stenting of the LAD and CFX 10/2013   Diabetes mellitus    TYPE 2   Fibromyalgia    H/O: gout    Heart murmur    Hx: UTI (urinary tract infection)    Hypertension     Past Surgical History:  Procedure Laterality Date   APPENDECTOMY  1951   BACK SURGERY  1972   CARDIAC CATHETERIZATION  04/2013   Left main 30-40%, prox LAD 40%, mid/distal LAD 90%, CFX 70%, OM 3 90%, RCA 40-50%, PDA 20-80%, EF 50%    CHOLECYSTECTOMY  1951   Closed reduction of Left Total Hip  2008   x 3   CORONARY STENT PLACEMENT  10/11/2013   2.25 x 20 mm Promus DES to mid/distal LAD & 3.5 x 12 mm Promus DES Mid CX         DR [place]   HERNIA REPAIR  1963   JOINT REPLACEMENT  left hip-2007, left knee-2000, right knee-1997   Left Hip, Bilateral Knee replacements   KNEE SURGERY  1997/2000   LEFT HEART CATHETERIZATION WITH CORONARY ANGIOGRAM N/A 04/25/2013   Procedure: LEFT HEART CATHETERIZATION WITH CORONARY ANGIOGRAM;  Surgeon: Thurmon Fair, MD;  Location: [facility] CATH LAB;  Service: Cardiovascular;  Laterality: N/A;   PERCUTANEOUS CORONARY STENT INTERVENTION (PCI-S) N/A 10/11/2013   Procedure: PERCUTANEOUS CORONARY STENT INTERVENTION (PCI-S);  Surgeon: Peter M [place], MD;  Location: [facility] CATH LAB;  Service: Cardiovascular;  Laterality: N/A;   REPLACEMENT TOTAL HIP W/  RESURFACING IMPLANTS  [id number]   Right Foot Surgery  2004   x 2   Salivary Gland Stone  Extraction     SPINAL FUSION  2006   TOTAL HIP ARTHROPLASTY Right 05/14/2020   Procedure: TOTAL HIP ARTHROPLASTY ANTERIOR APPROACH;  Surgeon: Ollen Gross, MD;  Location: [facility] ORS;  Service: Orthopedics;  Laterality: Right;    TOTAL HIP REVISION Left 04/08/2021   Procedure: TOTAL HIP REVISION;  Surgeon: Ollen Gross, MD;  Location: [facility] ORS;  Service: Orthopedics;  Laterality: Left;    Family History  Problem Relation Age of Onset   Cancer Mother    Heart attack Father    Stroke Father    Heart attack Brother    Cancer Brother     Social:  reports that she has never smoked. She has never used smokeless tobacco. She reports that she does not drink alcohol and does not use drugs.  Allergies:  Allergies  Allergen Reactions   Lisinopril Cough    Ace inhibitor   Pneumococcal Vaccines Swelling and Other (See Comments)    Very bad pain and swelling in the arm of the shot, needed 2 cortisone shots.   Antihistamines, Diphenhydramine-Type Other (See Comments)   Carvedilol Other (See Comments)    Weakness, dizziness, upset stomach, trembling    Codeine Nausea And Vomiting   Morphine And Related Nausea And Vomiting  Ultram [Tramadol Hcl] Itching    Medications: I have reviewed the patient's current medications.  Results for orders placed or performed during the hospital encounter of 04/25/21 (from the past 48 hour(s))  Glucose, capillary     Status: Abnormal   Collection Time: 04/27/21 12:01 PM  Result Value Ref Range   Glucose-Capillary 258 (H) 70 - 99 mg/dL    Comment: Glucose reference range applies only to samples taken after fasting for at least 8 hours.  Resp Panel by RT-PCR (Flu A&B, Covid) Nasopharyngeal Swab     Status: None   Collection Time: 04/27/21  3:36 PM   Specimen: Nasopharyngeal Swab; Nasopharyngeal(NP) swabs in vial transport medium  Result Value Ref Range   SARS Coronavirus 2 by RT PCR NEGATIVE NEGATIVE    Comment: (NOTE) SARS-CoV-2 target nucleic acids are  NOT DETECTED.  The SARS-CoV-2 RNA is generally detectable in upper respiratory specimens during the acute phase of infection. The lowest concentration of SARS-CoV-2 viral copies this assay can detect is 138 copies/mL. A negative result does not preclude SARS-Cov-2 infection and should not be used as the sole basis for treatment or other patient management decisions. A negative result may occur with  improper specimen collection/handling, submission of specimen other than nasopharyngeal swab, presence of viral mutation(s) within the areas targeted by this assay, and inadequate number of viral copies(<138 copies/mL). A negative result must be combined with clinical observations, patient history, and epidemiological information. The expected result is Negative.  Fact Sheet for Patients:  BloggerCourse.com  Fact Sheet for Healthcare Providers:  SeriousBroker.it  This test is no t yet approved or cleared by the [place] FDA and  has been authorized for detection and/or diagnosis of SARS-CoV-2 by FDA under an Emergency Use Authorization (EUA). This EUA will remain  in effect (meaning this test can be used) for the duration of the COVID-19 declaration under Section 564(b)(1) of the Act, 21 U.S.C.section 360bbb-3(b)(1), unless the authorization is terminated  or revoked sooner.       Influenza A by PCR NEGATIVE NEGATIVE   Influenza B by PCR NEGATIVE NEGATIVE    Comment: (NOTE) The Xpert Xpress SARS-CoV-2/FLU/RSV plus assay is intended as an aid in the diagnosis of influenza from Nasopharyngeal swab specimens and should not be used as a sole basis for treatment. Nasal washings and aspirates are unacceptable for Xpert Xpress SARS-CoV-2/FLU/RSV testing.  Fact Sheet for Patients: BloggerCourse.com  Fact Sheet for Healthcare Providers: SeriousBroker.it  This test is not yet approved  or cleared by the [place] FDA and has been authorized for detection and/or diagnosis of SARS-CoV-2 by FDA under an Emergency Use Authorization (EUA). This EUA will remain in effect (meaning this test can be used) for the duration of the COVID-19 declaration under Section 564(b)(1) of the Act, 21 U.S.C. section 360bbb-3(b)(1), unless the authorization is terminated or revoked.  Performed at [facility], [address] [place], [place] [address]   Glucose, capillary     Status: Abnormal   Collection Time: 04/27/21  4:13 PM  Result Value Ref Range   Glucose-Capillary 219 (H) 70 - 99 mg/dL    Comment: Glucose reference range applies only to samples taken after fasting for at least 8 hours.  Glucose, capillary     Status: Abnormal   Collection Time: 04/27/21  8:29 PM  Result Value Ref Range   Glucose-Capillary 257 (H) 70 - 99 mg/dL    Comment: Glucose reference range applies only to samples taken after fasting  for at least 8 hours.  Glucose, capillary     Status: Abnormal   Collection Time: 04/28/21  7:39 AM  Result Value Ref Range   Glucose-Capillary 272 (H) 70 - 99 mg/dL    Comment: Glucose reference range applies only to samples taken after fasting for at least 8 hours.  Glucose, capillary     Status: Abnormal   Collection Time: 04/28/21 11:23 AM  Result Value Ref Range   Glucose-Capillary 277 (H) 70 - 99 mg/dL    Comment: Glucose reference range applies only to samples taken after fasting for at least 8 hours.  Glucose, capillary     Status: Abnormal   Collection Time: 04/28/21  4:19 PM  Result Value Ref Range   Glucose-Capillary 165 (H) 70 - 99 mg/dL    Comment: Glucose reference range applies only to samples taken after fasting for at least 8 hours.  Glucose, capillary     Status: Abnormal   Collection Time: 04/28/21  8:18 PM  Result Value Ref Range   Glucose-Capillary 152 (H) 70 - 99 mg/dL    Comment: Glucose reference range applies only to samples  taken after fasting for at least 8 hours.  Glucose, capillary     Status: Abnormal   Collection Time: 04/29/21  7:45 AM  Result Value Ref Range   Glucose-Capillary 165 (H) 70 - 99 mg/dL    Comment: Glucose reference range applies only to samples taken after fasting for at least 8 hours.    DG Chest 2 View  Result Date: 04/27/2021 CLINICAL DATA:  A 88 year old female presents following lung scan. EXAM: CHEST - 2 VIEW COMPARISON:  April 25, 2021. FINDINGS: There is ectasia of the thoracic aorta which is similar to prior study. Signs of calcified aortic atherosclerosis also similar. EKG leads project over the chest. Lungs are clear and moderately hyperinflated. No visible pneumothorax. No sign of pleural effusion. IMPRESSION: Ectatic thoracic aorta with calcified aortic atherosclerosis, no radiographic evidence of acute cardiopulmonary disease. Electronically Signed   By: Donzetta Kohut M.D.   On: 04/27/2021 13:17   NM Pulmonary Perfusion  Result Date: 04/27/2021 CLINICAL DATA:  Dyspnea on exertion. Pulmonary embolism suspected, high probability. EXAM: NUCLEAR MEDICINE PERFUSION LUNG SCAN TECHNIQUE: Perfusion images were obtained in multiple projections after intravenous injection of radiopharmaceutical. Ventilation scans intentionally deferred if perfusion scan and chest x-ray adequate for interpretation during COVID 19 epidemic. RADIOPHARMACEUTICALS:  4.4 mCi Tc-24m MAA IV COMPARISON:  Chest radiograph 04/27/2021 FINDINGS: No significant peripheral or wedge-shaped perfusion abnormalities in the lungs. Ventilation images were not obtained. IMPRESSION: No significant perfusion abnormality. No evidence to suggest pulmonary embolism. Electronically Signed   By: Richarda Overlie M.D.   On: 04/27/2021 11:57   ECHOCARDIOGRAM COMPLETE  Result Date: 04/27/2021    ECHOCARDIOGRAM REPORT   Patient Name:   Karen [name] Date of Exam: 04/27/2021 Medical Rec #:  [id number]         Height:       64.0 in Accession #:     [id number]        Weight:       147.0 lb Date of Birth:  1929-01-26         BSA:          1.717 m Patient Age:    89 years          BP:           124/73 mmHg Patient Gender: F  HR:           95 bpm. Exam Location:  Inpatient Procedure: 2D Echo, Cardiac Doppler and Color Doppler Indications:    Dyspnea  History:        Patient has prior history of Echocardiogram examinations. CHF,                 CAD; Risk Factors:Hypertension and Diabetes.  Sonographer:    Cleatis Polka Referring Phys: Gomez Cleverly LAMA IMPRESSIONS  1. Distal septal hypokinesis . Left ventricular ejection fraction, by estimation, is 50 to 55%. The left ventricle has low normal function. The left ventricle demonstrates regional wall motion abnormalities (see scoring diagram/findings for description). There is mild asymmetric left ventricular hypertrophy of the basal and septal segments. Left ventricular diastolic parameters are indeterminate.  2. Right ventricular systolic function is normal. The right ventricular size is normal.  3. Left atrial size was mildly dilated.  4. The mitral valve is degenerative. Trivial mitral valve regurgitation. No evidence of mitral stenosis. Moderate mitral annular calcification.  5. The aortic valve is tricuspid. There is moderate calcification of the aortic valve. There is moderate thickening of the aortic valve. Aortic valve regurgitation is trivial. Aortic valve sclerosis/calcification is present, without any evidence of aortic stenosis.  6. The inferior vena cava is normal in size with greater than 50% respiratory variability, suggesting right atrial pressure of 3 mmHg. FINDINGS  Left Ventricle: Distal septal hypokinesis. Left ventricular ejection fraction, by estimation, is 50 to 55%. The left ventricle has low normal function. The left ventricle demonstrates regional wall motion abnormalities. The left ventricular internal cavity size was normal in size. There is mild asymmetric left ventricular  hypertrophy of the basal and septal segments. Left ventricular diastolic parameters are indeterminate. Right Ventricle: The right ventricular size is normal. No increase in right ventricular wall thickness. Right ventricular systolic function is normal. Left Atrium: Left atrial size was mildly dilated. Right Atrium: Right atrial size was normal in size. Pericardium: There is no evidence of pericardial effusion. Mitral Valve: The mitral valve is degenerative in appearance. There is moderate thickening of the mitral valve leaflet(s). There is moderate calcification of the mitral valve leaflet(s). Moderate mitral annular calcification. Trivial mitral valve regurgitation. No evidence of mitral valve stenosis. MV peak gradient, 13.4 mmHg. The mean mitral valve gradient is 5.0 mmHg. Tricuspid Valve: The tricuspid valve is normal in structure. Tricuspid valve regurgitation is mild . No evidence of tricuspid stenosis. Aortic Valve: The aortic valve is tricuspid. There is moderate calcification of the aortic valve. There is moderate thickening of the aortic valve. Aortic valve regurgitation is trivial. Aortic regurgitation PHT measures 342 msec. Aortic valve sclerosis/calcification is present, without any evidence of aortic stenosis. Aortic valve peak gradient measures 7.4 mmHg. Pulmonic Valve: The pulmonic valve was normal in structure. Pulmonic valve regurgitation is not visualized. No evidence of pulmonic stenosis. Aorta: The aortic root is normal in size and structure. Venous: The inferior vena cava is normal in size with greater than 50% respiratory variability, suggesting right atrial pressure of 3 mmHg. IAS/Shunts: No atrial level shunt detected by color flow Doppler.  LEFT VENTRICLE PLAX 2D LVIDd:         4.10 cm     Diastology LVIDs:         2.30 cm     LV e' medial:    4.57 cm/s LV PW:         1.10 cm     LV E/e' medial:  15.7 LV IVS:        1.30 cm     LV e' lateral:   5.44 cm/s LVOT diam:     2.00 cm     LV E/e'  lateral: 13.2 LV SV:         57 LV SV Index:   33 LVOT Area:     3.14 cm  LV Volumes (MOD) LV vol d, MOD A2C: 88.5 ml LV vol d, MOD A4C: 92.3 ml LV vol s, MOD A2C: 40.1 ml LV vol s, MOD A4C: 45.2 ml LV SV MOD A2C:     48.4 ml LV SV MOD A4C:     92.3 ml LV SV MOD BP:      47.6 ml RIGHT VENTRICLE             IVC RV Basal diam:  2.60 cm     IVC diam: 1.20 cm RV Mid diam:    2.40 cm RV S prime:     10.60 cm/s TAPSE (M-mode): 2.1 cm LEFT ATRIUM             Index        RIGHT ATRIUM          Index LA diam:        3.80 cm 2.21 cm/m   RA Area:     9.31 cm LA Vol (A2C):   45.2 ml 26.33 ml/m  RA Volume:   14.90 ml 8.68 ml/m LA Vol (A4C):   48.4 ml 28.19 ml/m LA Biplane Vol: 46.6 ml 27.15 ml/m  AORTIC VALVE AV Area (Vmax): 2.26 cm AV Vmax:        136.00 cm/s AV Peak Grad:   7.4 mmHg LVOT Vmax:      97.90 cm/s LVOT Vmean:     72.600 cm/s LVOT VTI:       0.180 m AI PHT:         342 msec  AORTA Ao Root diam: 3.30 cm Ao Asc diam:  3.60 cm MITRAL VALVE                TRICUSPID VALVE MV Area (PHT): 2.39 cm     TR Peak grad:   27.5 mmHg MV Area VTI:   2.04 cm     TR Vmax:        262.00 cm/s MV Peak grad:  13.4 mmHg MV Mean grad:  5.0 mmHg     SHUNTS MV Vmax:       1.83 m/s     Systemic VTI:  0.18 m MV Vmean:      100.3 cm/s   Systemic Diam: 2.00 cm MV Decel Time: 317 msec MV E velocity: 71.60 cm/s MV A velocity: 148.00 cm/s MV E/A ratio:  0.48 Charlton Haws MD Electronically signed by Charlton Haws MD Signature Date/Time: 04/27/2021/2:09:51 PM    Final     ROS - all of the below systems have been reviewed with the patient and positives are indicated with bold text General: chills, fever or night sweats Eyes: blurry vision or double vision ENT: epistaxis or sore throat Hematologic/Lymphatic: bleeding problems, blood clots or swollen lymph nodes Endocrine: temperature intolerance or unexpected weight changes Breast: new or changing breast lumps or nipple discharge Resp: cough, shortness of breath, or wheezing CV: chest  pain or dyspnea on exertion GI: as per HPI GU: dysuria, trouble voiding, or hematuria Neuro: TIA or stroke symptoms    PE Blood pressure 117/66, pulse 86, temperature (!) 97.5  F (36.4 C), temperature source Oral, resp. rate 18, height 5\' 4"  (1.626 m), weight 66.7 kg, last menstrual period 05/10/1998, SpO2 100 %. Constitutional: NAD; conversant; no deformities GI: Abd soft Rectal: ~1cm fluctuance in R lateral perianal space MSK: Normal range of motion of extremities; no clubbing/cyanosis Psychiatric: Appropriate affect; alert and oriented x3  Results for orders placed or performed during the hospital encounter of 04/25/21 (from the past 48 hour(s))  Glucose, capillary     Status: Abnormal   Collection Time: 04/27/21 12:01 PM  Result Value Ref Range   Glucose-Capillary 258 (H) 70 - 99 mg/dL    Comment: Glucose reference range applies only to samples taken after fasting for at least 8 hours.  Resp Panel by RT-PCR (Flu A&B, Covid) Nasopharyngeal Swab     Status: None   Collection Time: 04/27/21  3:36 PM   Specimen: Nasopharyngeal Swab; Nasopharyngeal(NP) swabs in vial transport medium  Result Value Ref Range   SARS Coronavirus 2 by RT PCR NEGATIVE NEGATIVE    Comment: (NOTE) SARS-CoV-2 target nucleic acids are NOT DETECTED.  The SARS-CoV-2 RNA is generally detectable in upper respiratory specimens during the acute phase of infection. The lowest concentration of SARS-CoV-2 viral copies this assay can detect is 138 copies/mL. A negative result does not preclude SARS-Cov-2 infection and should not be used as the sole basis for treatment or other patient management decisions. A negative result may occur with  improper specimen collection/handling, submission of specimen other than nasopharyngeal swab, presence of viral mutation(s) within the areas targeted by this assay, and inadequate number of viral copies(<138 copies/mL). A negative result must be combined with clinical  observations, patient history, and epidemiological information. The expected result is Negative.  Fact Sheet for Patients:  BloggerCourse.com  Fact Sheet for Healthcare Providers:  SeriousBroker.it  This test is no t yet approved or cleared by the [place] FDA and  has been authorized for detection and/or diagnosis of SARS-CoV-2 by FDA under an Emergency Use Authorization (EUA). This EUA will remain  in effect (meaning this test can be used) for the duration of the COVID-19 declaration under Section 564(b)(1) of the Act, 21 U.S.C.section 360bbb-3(b)(1), unless the authorization is terminated  or revoked sooner.       Influenza A by PCR NEGATIVE NEGATIVE   Influenza B by PCR NEGATIVE NEGATIVE    Comment: (NOTE) The Xpert Xpress SARS-CoV-2/FLU/RSV plus assay is intended as an aid in the diagnosis of influenza from Nasopharyngeal swab specimens and should not be used as a sole basis for treatment. Nasal washings and aspirates are unacceptable for Xpert Xpress SARS-CoV-2/FLU/RSV testing.  Fact Sheet for Patients: BloggerCourse.com  Fact Sheet for Healthcare Providers: SeriousBroker.it  This test is not yet approved or cleared by the [place] FDA and has been authorized for detection and/or diagnosis of SARS-CoV-2 by FDA under an Emergency Use Authorization (EUA). This EUA will remain in effect (meaning this test can be used) for the duration of the COVID-19 declaration under Section 564(b)(1) of the Act, 21 U.S.C. section 360bbb-3(b)(1), unless the authorization is terminated or revoked.  Performed at [facility], [address], [place], [place] [address]   Glucose, capillary     Status: Abnormal   Collection Time: 04/27/21  4:13 PM  Result Value Ref Range   Glucose-Capillary 219 (H) 70 - 99 mg/dL    Comment: Glucose reference range applies only  to samples taken after fasting for at least 8 hours.  Glucose, capillary  Status: Abnormal   Collection Time: 04/27/21  8:29 PM  Result Value Ref Range   Glucose-Capillary 257 (H) 70 - 99 mg/dL    Comment: Glucose reference range applies only to samples taken after fasting for at least 8 hours.  Glucose, capillary     Status: Abnormal   Collection Time: 04/28/21  7:39 AM  Result Value Ref Range   Glucose-Capillary 272 (H) 70 - 99 mg/dL    Comment: Glucose reference range applies only to samples taken after fasting for at least 8 hours.  Glucose, capillary     Status: Abnormal   Collection Time: 04/28/21 11:23 AM  Result Value Ref Range   Glucose-Capillary 277 (H) 70 - 99 mg/dL    Comment: Glucose reference range applies only to samples taken after fasting for at least 8 hours.  Glucose, capillary     Status: Abnormal   Collection Time: 04/28/21  4:19 PM  Result Value Ref Range   Glucose-Capillary 165 (H) 70 - 99 mg/dL    Comment: Glucose reference range applies only to samples taken after fasting for at least 8 hours.  Glucose, capillary     Status: Abnormal   Collection Time: 04/28/21  8:18 PM  Result Value Ref Range   Glucose-Capillary 152 (H) 70 - 99 mg/dL    Comment: Glucose reference range applies only to samples taken after fasting for at least 8 hours.  Glucose, capillary     Status: Abnormal   Collection Time: 04/29/21  7:45 AM  Result Value Ref Range   Glucose-Capillary 165 (H) 70 - 99 mg/dL    Comment: Glucose reference range applies only to samples taken after fasting for at least 8 hours.    DG Chest 2 View  Result Date: 04/27/2021 CLINICAL DATA:  A 88 year old female presents following lung scan. EXAM: CHEST - 2 VIEW COMPARISON:  April 25, 2021. FINDINGS: There is ectasia of the thoracic aorta which is similar to prior study. Signs of calcified aortic atherosclerosis also similar. EKG leads project over the chest. Lungs are clear and moderately hyperinflated. No  visible pneumothorax. No sign of pleural effusion. IMPRESSION: Ectatic thoracic aorta with calcified aortic atherosclerosis, no radiographic evidence of acute cardiopulmonary disease. Electronically Signed   By: Donzetta Kohut M.D.   On: 04/27/2021 13:17   NM Pulmonary Perfusion  Result Date: 04/27/2021 CLINICAL DATA:  Dyspnea on exertion. Pulmonary embolism suspected, high probability. EXAM: NUCLEAR MEDICINE PERFUSION LUNG SCAN TECHNIQUE: Perfusion images were obtained in multiple projections after intravenous injection of radiopharmaceutical. Ventilation scans intentionally deferred if perfusion scan and chest x-ray adequate for interpretation during COVID 19 epidemic. RADIOPHARMACEUTICALS:  4.4 mCi Tc-20m MAA IV COMPARISON:  Chest radiograph 04/27/2021 FINDINGS: No significant peripheral or wedge-shaped perfusion abnormalities in the lungs. Ventilation images were not obtained. IMPRESSION: No significant perfusion abnormality. No evidence to suggest pulmonary embolism. Electronically Signed   By: Richarda Overlie M.D.   On: 04/27/2021 11:57   ECHOCARDIOGRAM COMPLETE  Result Date: 04/27/2021    ECHOCARDIOGRAM REPORT   Patient Name:   Karen [name] Date of Exam: 04/27/2021 Medical Rec #:  [id number]         Height:       64.0 in Accession #:    [id number]        Weight:       147.0 lb Date of Birth:  1928/05/24         BSA:          1.717 m Patient Age:  89 years          BP:           124/73 mmHg Patient Gender: F                 HR:           95 bpm. Exam Location:  Inpatient Procedure: 2D Echo, Cardiac Doppler and Color Doppler Indications:    Dyspnea  History:        Patient has prior history of Echocardiogram examinations. CHF,                 CAD; Risk Factors:Hypertension and Diabetes.  Sonographer:    Cleatis Polka Referring Phys: Gomez Cleverly LAMA IMPRESSIONS  1. Distal septal hypokinesis . Left ventricular ejection fraction, by estimation, is 50 to 55%. The left ventricle has low normal function. The  left ventricle demonstrates regional wall motion abnormalities (see scoring diagram/findings for description). There is mild asymmetric left ventricular hypertrophy of the basal and septal segments. Left ventricular diastolic parameters are indeterminate.  2. Right ventricular systolic function is normal. The right ventricular size is normal.  3. Left atrial size was mildly dilated.  4. The mitral valve is degenerative. Trivial mitral valve regurgitation. No evidence of mitral stenosis. Moderate mitral annular calcification.  5. The aortic valve is tricuspid. There is moderate calcification of the aortic valve. There is moderate thickening of the aortic valve. Aortic valve regurgitation is trivial. Aortic valve sclerosis/calcification is present, without any evidence of aortic stenosis.  6. The inferior vena cava is normal in size with greater than 50% respiratory variability, suggesting right atrial pressure of 3 mmHg. FINDINGS  Left Ventricle: Distal septal hypokinesis. Left ventricular ejection fraction, by estimation, is 50 to 55%. The left ventricle has low normal function. The left ventricle demonstrates regional wall motion abnormalities. The left ventricular internal cavity size was normal in size. There is mild asymmetric left ventricular hypertrophy of the basal and septal segments. Left ventricular diastolic parameters are indeterminate. Right Ventricle: The right ventricular size is normal. No increase in right ventricular wall thickness. Right ventricular systolic function is normal. Left Atrium: Left atrial size was mildly dilated. Right Atrium: Right atrial size was normal in size. Pericardium: There is no evidence of pericardial effusion. Mitral Valve: The mitral valve is degenerative in appearance. There is moderate thickening of the mitral valve leaflet(s). There is moderate calcification of the mitral valve leaflet(s). Moderate mitral annular calcification. Trivial mitral valve regurgitation. No  evidence of mitral valve stenosis. MV peak gradient, 13.4 mmHg. The mean mitral valve gradient is 5.0 mmHg. Tricuspid Valve: The tricuspid valve is normal in structure. Tricuspid valve regurgitation is mild . No evidence of tricuspid stenosis. Aortic Valve: The aortic valve is tricuspid. There is moderate calcification of the aortic valve. There is moderate thickening of the aortic valve. Aortic valve regurgitation is trivial. Aortic regurgitation PHT measures 342 msec. Aortic valve sclerosis/calcification is present, without any evidence of aortic stenosis. Aortic valve peak gradient measures 7.4 mmHg. Pulmonic Valve: The pulmonic valve was normal in structure. Pulmonic valve regurgitation is not visualized. No evidence of pulmonic stenosis. Aorta: The aortic root is normal in size and structure. Venous: The inferior vena cava is normal in size with greater than 50% respiratory variability, suggesting right atrial pressure of 3 mmHg. IAS/Shunts: No atrial level shunt detected by color flow Doppler.  LEFT VENTRICLE PLAX 2D LVIDd:         4.10 cm  Diastology LVIDs:         2.30 cm     LV e' medial:    4.57 cm/s LV PW:         1.10 cm     LV E/e' medial:  15.7 LV IVS:        1.30 cm     LV e' lateral:   5.44 cm/s LVOT diam:     2.00 cm     LV E/e' lateral: 13.2 LV SV:         57 LV SV Index:   33 LVOT Area:     3.14 cm  LV Volumes (MOD) LV vol d, MOD A2C: 88.5 ml LV vol d, MOD A4C: 92.3 ml LV vol s, MOD A2C: 40.1 ml LV vol s, MOD A4C: 45.2 ml LV SV MOD A2C:     48.4 ml LV SV MOD A4C:     92.3 ml LV SV MOD BP:      47.6 ml RIGHT VENTRICLE             IVC RV Basal diam:  2.60 cm     IVC diam: 1.20 cm RV Mid diam:    2.40 cm RV S prime:     10.60 cm/s TAPSE (M-mode): 2.1 cm LEFT ATRIUM             Index        RIGHT ATRIUM          Index LA diam:        3.80 cm 2.21 cm/m   RA Area:     9.31 cm LA Vol (A2C):   45.2 ml 26.33 ml/m  RA Volume:   14.90 ml 8.68 ml/m LA Vol (A4C):   48.4 ml 28.19 ml/m LA Biplane Vol:  46.6 ml 27.15 ml/m  AORTIC VALVE AV Area (Vmax): 2.26 cm AV Vmax:        136.00 cm/s AV Peak Grad:   7.4 mmHg LVOT Vmax:      97.90 cm/s LVOT Vmean:     72.600 cm/s LVOT VTI:       0.180 m AI PHT:         342 msec  AORTA Ao Root diam: 3.30 cm Ao Asc diam:  3.60 cm MITRAL VALVE                TRICUSPID VALVE MV Area (PHT): 2.39 cm     TR Peak grad:   27.5 mmHg MV Area VTI:   2.04 cm     TR Vmax:        262.00 cm/s MV Peak grad:  13.4 mmHg MV Mean grad:  5.0 mmHg     SHUNTS MV Vmax:       1.83 m/s     Systemic VTI:  0.18 m MV Vmean:      100.3 cm/s   Systemic Diam: 2.00 cm MV Decel Time: 317 msec MV E velocity: 71.60 cm/s MV A velocity: 148.00 cm/s MV E/A ratio:  0.48 Charlton Haws MD Electronically signed by Charlton Haws MD Signature Date/Time: 04/27/2021/2:09:51 PM    Final      A/P: Karen [name] is an 88 y.o. female with what appears to be a perirectal abscess.  I recommend bedside I&D under local anesthesia.  Pt agreeable to this.  Will complete later today.  Pt is on daily aspirin, so she is at a slightly higher risk of bleeding during and after the procedure.  Vanita Panda, MD  Colorectal and General Surgery [facility]  Total time of evaluation, examination, counseling and implementing medical decisions was 40 mins.

## 2021-04-30 DIAGNOSIS — I5042 Chronic combined systolic (congestive) and diastolic (congestive) heart failure: Secondary | ICD-10-CM | POA: Diagnosis not present

## 2021-04-30 DIAGNOSIS — I10 Essential (primary) hypertension: Secondary | ICD-10-CM | POA: Diagnosis not present

## 2021-04-30 DIAGNOSIS — N1831 Chronic kidney disease, stage 3a: Secondary | ICD-10-CM | POA: Diagnosis not present

## 2021-04-30 DIAGNOSIS — J9601 Acute respiratory failure with hypoxia: Secondary | ICD-10-CM | POA: Diagnosis not present

## 2021-04-30 LAB — GLUCOSE, CAPILLARY
Glucose-Capillary: 261 mg/dL — ABNORMAL HIGH (ref 70–99)
Glucose-Capillary: 257 mg/dL — ABNORMAL HIGH (ref 70–99)
Glucose-Capillary: 79 mg/dL (ref 70–99)
Glucose-Capillary: 193 mg/dL — ABNORMAL HIGH (ref 70–99)

## 2021-04-30 MED ORDER — CLOPIDOGREL BISULFATE 75 MG PO TABS
75.0000 mg | ORAL_TABLET | Freq: Every day | ORAL | Status: DC
  Administered 2021-04-30 – 2021-05-01 (×2): 75 mg via ORAL
  Filled 2021-04-30 (×2): qty 1

## 2021-04-30 MED ORDER — INSULIN ASPART 100 UNIT/ML IJ SOLN
4.0000 [IU] | Freq: Three times a day (TID) | INTRAMUSCULAR | Status: DC
  Administered 2021-04-30 – 2021-05-01 (×3): 4 [IU] via SUBCUTANEOUS

## 2021-04-30 MED ORDER — INSULIN GLARGINE-YFGN 100 UNIT/ML ~~LOC~~ SOLN
10.0000 [IU] | Freq: Two times a day (BID) | SUBCUTANEOUS | Status: DC
  Administered 2021-04-30 – 2021-05-01 (×3): 10 [IU] via SUBCUTANEOUS
  Filled 2021-04-30 (×4): qty 0.1

## 2021-04-30 NOTE — Plan of Care (Signed)
Karen [name] has been up to the [facility] and sitting up in the chair several times today.   She has had 2 sitz baths for 15 minutes each.  Karen [name] ambulated in the hall with PT today.  She has denied the need for any pain today. Karen [name] does report some DOE intermittently.  ?Problem: Health Behavior/Discharge Planning: ?Goal: Ability to manage health-related needs will improve ?Outcome: Progressing ?  ?Problem: Clinical Measurements: ?Goal: Ability to maintain clinical measurements within normal limits will improve ?Outcome: Progressing ?Goal: Will remain free from infection ?Outcome: Progressing ?Note: PO augmentin  ?Goal: Diagnostic test results will improve ?Outcome: Progressing ?Goal: Respiratory complications will improve ?Outcome: Progressing ?Note: Still with intermittent DOE ?  ?Problem: Activity: ?Goal: Risk for activity intolerance will decrease ?Outcome: Progressing ?  ?Problem: Elimination: ?Goal: Will not experience complications related to bowel motility ?Outcome: Progressing ?  ?Problem: Pain Managment: ?Goal: General experience of comfort will improve ?Outcome: Progressing ?  ?Problem: Safety: ?Goal: Ability to remain free from injury will improve ?Outcome: Progressing ?  ?Problem: Skin Integrity: ?Goal: Risk for impaired skin integrity will decrease ?Outcome: Progressing ?  ?Problem: Education: ?Goal: Knowledge of General Education information will improve ?Description: Including pain rating scale, medication(s)/side effects and non-pharmacologic comfort measures ?Outcome: Progressing ?  ?Problem: Health Behavior/Discharge Planning: ?Goal: Ability to manage health-related needs will improve ?Outcome: Progressing ?  ?Problem: Clinical Measurements: ?Goal: Ability to maintain clinical measurements within normal limits will improve ?Outcome: Progressing ?Goal: Will remain free from infection ?Outcome: Progressing ?Goal: Diagnostic test results will improve ?Outcome: Progressing ?Goal: Respiratory complications will  improve ?Outcome: Progressing ?Goal: Cardiovascular complication will be avoided ?Outcome: Progressing ?  ?Problem: Activity: ?Goal: Risk for activity intolerance will decrease ?Outcome: Progressing ?  ?Problem: Elimination: ?Goal: Will not experience complications related to bowel motility ?Outcome: Progressing ?  ?Problem: Pain Managment: ?Goal: General experience of comfort will improve ?Outcome: Progressing ?  ?Problem: Safety: ?Goal: Ability to remain free from injury will improve ?Outcome: Progressing ?  ?

## 2021-04-30 NOTE — TOC Progression Note (Signed)
Transition of Care (TOC) - Progression Note  ? ? ?Patient Details  ?Name: Karen [name] ?MRN: [id number] ?Date of Birth: 07/30/1928 ? ?Transition of Care (TOC) CM/SW Contact  ?Tawanna Cooler, RN ?Phone Number: ?04/30/2021, 12:19 PM ? ?Clinical Narrative:    ? ?PT recommends home health PT.  Per prior TOC note, son prefers [place].  Contacted [facility] and referral accepted.   ?  ?  ?HH Arranged: PT ?[place] Agency: [place] ?Date HH Agency Contacted: 04/30/21 ?Time [place]: 1219 ?Representative spoke with at [place]: Tommi Rumps ? ? ?

## 2021-04-30 NOTE — Progress Notes (Signed)
[facility] ?PROGRESS NOTE ? ? ? ?Progress Note  ?Karen [name]  OEV:[id number] DOB: 02-21-1928 DOA: 04/25/2021 ?PCP: [name], DO  ? ? ? ?Brief Narrative:  ? ?Karen [name] is an 88 y.o. female past medical history of CAD status post stenting, chronic diastolic heart failure, chronic kidney stage IIIb, diabetes mellitus, status post l left total hip arthroplasty revision on 04/09/2021, discharged home on aspirin for DVT prophylaxis complicated by hematoma needing drain on 04/21/2021 presents with shortness of breath unable to lay flat there is concern for an acute pulmonary emboli but CTA not done due to renal dysfunction, VQ scan showed no PE, on admission cardiac biomarkers were slightly elevated 2D echo was done that showed an EF of 50% with regional wall motion abnormalities no aortic stenosis and cardiology was consulted for evaluation. ? ?Assessment/Plan:  ? ?Elevated troponin/abnormal echo and shortness of breath: ?Cardiology recommended planned for intervention as an outpatient. ?Orthopedic surgery recommended to let this rest for a week or 2 to see if it starts to resolve. She will see me in the office on 3/28. ?Continue statins and resume beta-blockers. ?Cardiology to dictate when to start aspirin and Plavix the patient is willing to start aspirin and Plavix in-house. ?Nitro sublingual as needed. ? ?Recent hip surgery with a new left hip seroma/hematoma: ?Patient has an area of induration in the left buttock nontender to palp palpation. ?X-ray of the hip was unremarkable except for soft tissue swelling. ?Discussed the case with Dr. Wynelle Link that evaluated the patient and relates there is no sign of infection and her hemoglobin has remained stable and recommended conservative management and let this rest for 2 weeks and see if it starts to resolve, to follow-up with him at the office on 05/05/2021. ? ?Perirectal abscess: ?Started empirically on oral Augmentin has remained afebrile. ?Tmax of  98.2, surgery was consulted she is status post I&D, surgery recommended sitz bath continue Augmentin for 5 days follow-up in the office. ?No signs of bleeding. ? ?Essential hypertension ?Currently on beta-blocker. ? ?Macrocytic anemia: ?No signs of overt bleeding her hemoglobin is stable. ?MCV 104 B12 174 continue B12 repletion. ? ?Diabetes mellitus type 2: ?Long-acting insulin plus sliding scale blood glucose fairly controlled. ? ?Chronic kidney stage IIIb: ?Creatinine at baseline 1.6 continue to follow. ? ? ?DVT prophylaxis: Lovenox ?Family Communication:son ?Status is: Inpatient ?Remains inpatient appropriate because: Shortness of breath ? ? ? ?Code Status:  ? ?  ?Code Status Orders  ?(From admission, onward)  ?  ? ? ?  ? ?  Start     Ordered  ? 04/26/21 0247  Full code  Continuous       ? 04/26/21 0248  ? ?  ?  ? ?  ? ?Code Status History   ? ? Date Active Date Inactive Code Status Order ID Comments User Context  ? 04/08/2021 2147 04/11/2021 1743 Full Code [id number]  Gaynelle Arabian, MD Inpatient  ? 05/14/2020 1635 05/15/2020 2054 Full Code [id number]  Derl Barrow, PA Inpatient  ? 10/11/2013 1528 10/12/2013 1416 Full Code [id number]  [place], Peter M, MD Inpatient  ? 04/25/2013 1050 04/25/2013 1840 Full Code [id number]  Sanda Klein, MD Inpatient  ? 04/25/2013 1050 04/25/2013 1050 Full Code [id number]  [place], Dani Gobble, MD Inpatient  ? 05/16/2012 0955 05/16/2012 2119 Full Code [id number]  Leota Jacobsen, MD ED  ? 10/20/2011 0344 10/20/2011 1838 Full Code [id number]  Wandra Arthurs, MD ED  ? ?  ? ? ? ? ?IV  Access:  ? ?Peripheral IV ? ? ?Procedures and diagnostic studies:  ? ?No results found. ? ? ?Medical Consultants:  ? ?None. ? ? ?Subjective:  ? ? ?Karen [name] had an episode of shortness of breath yesterday in the afternoon otherwise this morning she feels great. ? ?Objective:  ? ? ?Vitals:  ? 04/29/21 1054 04/29/21 1345 04/29/21 2008 04/30/21 0448  ?BP: (!) 145/64 120/68 (!) 143/73 (!) 143/64  ?Pulse: 87 89 87 88  ?Resp:  '16  16 16  '$ ?Temp:  98.2 ?F (36.8 ?C) 98.2 ?F (36.8 ?C) 97.7 ?F (36.5 ?C)  ?TempSrc:  Oral Oral Oral  ?SpO2:  98% 100% 98%  ?Weight:      ?Height:      ? ?SpO2: 98 % ?O2 Flow Rate (L/min): 2 L/min ? ? ?Intake/Output Summary (Last 24 hours) at 04/30/2021 0830 ?Last data filed at 04/30/2021 4801 ?Gross per 24 hour  ?Intake --  ?Output 200 ml  ?Net -200 ml  ? ? ?Filed Weights  ? 04/25/21 2152 04/26/21 0244  ?Weight: 63.4 kg 66.7 kg  ? ? ?Exam: ?General exam: In no acute distress. ?Respiratory system: Good air movement and clear to auscultation. ?Cardiovascular system: S1 & S2 heard, RRR. No JVD. ?Gastrointestinal system: Abdomen is nondistended, soft and nontender.  ?Extremities: No pedal edema. ?Skin: No rashes, lesions or ulcers ?Psychiatry: Judgement and insight appear normal. Mood & affect appropriate. ? ? ?Data Reviewed:  ? ? ?Labs: ?Basic Metabolic Panel: ?Recent Labs  ?Lab 04/25/21 ?2257 04/26/21 ?0313  ?NA 133* 132*  ?K 4.7 4.9  ?CL 101 104  ?CO2 20* 17*  ?GLUCOSE 357* 318*  ?BUN 39* 36*  ?CREATININE 1.75* 1.60*  ?CALCIUM 9.1 8.5*  ? ? ?GFR ?Estimated Creatinine Clearance: 22 mL/min (A) (by C-G formula based on SCr of 1.6 mg/dL (H)). ?Liver Function Tests: ?Recent Labs  ?Lab 04/25/21 ?2257  ?AST 24  ?ALT 19  ?ALKPHOS 74  ?BILITOT 0.4  ?PROT 6.8  ?ALBUMIN 3.3*  ? ? ?No results for input(s): LIPASE, AMYLASE in the last 168 hours. ?No results for input(s): AMMONIA in the last 168 hours. ?Coagulation profile ?No results for input(s): INR, PROTIME in the last 168 hours. ?COVID-19 Labs ? ?No results for input(s): DDIMER, FERRITIN, LDH, CRP in the last 72 hours. ? ?Lab Results  ?Component Value Date  ? [place] NEGATIVE 04/27/2021  ? Karen [name] NEGATIVE 04/08/2021  ? Karen [name] NEGATIVE 05/12/2020  ? ? ?CBC: ?Recent Labs  ?Lab 04/25/21 ?2257 04/26/21 ?6553 04/29/21 ?7482  ?WBC 9.8 7.6 7.8  ?NEUTROABS 8.0*  --  5.8  ?HGB 11.2* 10.4* 10.5*  ?HCT 34.2* 32.0* 31.4*  ?MCV 103.3* 104.2* 101.9*  ?PLT 262 252 262  ? ? ?Cardiac  Enzymes: ?No results for input(s): CKTOTAL, CKMB, CKMBINDEX, TROPONINI in the last 168 hours. ?BNP (last 3 results) ?No results for input(s): PROBNP in the last 8760 hours. ?CBG: ?Recent Labs  ?Lab 04/29/21 ?0745 04/29/21 ?1138 04/29/21 ?1616 04/29/21 ?2006 04/30/21 ?0720  ?GLUCAP 165* 282* 241* 245* 261*  ? ? ?D-Dimer: ?No results for input(s): DDIMER in the last 72 hours. ?Hgb A1c: ?No results for input(s): HGBA1C in the last 72 hours. ?Lipid Profile: ?No results for input(s): CHOL, HDL, LDLCALC, TRIG, CHOLHDL, LDLDIRECT in the last 72 hours. ?Thyroid function studies: ?No results for input(s): TSH, T4TOTAL, T3FREE, THYROIDAB in the last 72 hours. ? ?Invalid input(s): FREET3 ?Anemia work up: ?No results for input(s): VITAMINB12, FOLATE, FERRITIN, TIBC, IRON, RETICCTPCT in the last 72 hours. ?Sepsis Labs: ?Recent  Labs  ?Lab 04/25/21 ?2257 04/26/21 ?3748 04/29/21 ?2707  ?WBC 9.8 7.6 7.8  ? ? ?Microbiology ?Recent Results (from the past 240 hour(s))  ?Resp Panel by RT-PCR (Flu A&B, Covid) Nasopharyngeal Swab     Status: None  ? Collection Time: 04/27/21  3:36 PM  ? Specimen: Nasopharyngeal Swab; Nasopharyngeal(NP) swabs in vial transport medium  ?Result Value Ref Range Status  ? SARS Coronavirus 2 by RT PCR NEGATIVE NEGATIVE Final  ?  Comment: (NOTE) ?SARS-CoV-2 target nucleic acids are NOT DETECTED. ? ?The SARS-CoV-2 RNA is generally detectable in upper respiratory ?specimens during the acute phase of infection. The lowest ?concentration of SARS-CoV-2 viral copies this assay can detect is ?138 copies/mL. A negative result does not preclude SARS-Cov-2 ?infection and should not be used as the sole basis for treatment or ?other patient management decisions. A negative result may occur with  ?improper specimen collection/handling, submission of specimen other ?than nasopharyngeal swab, presence of viral mutation(s) within the ?areas targeted by this assay, and inadequate number of viral ?copies(<138 copies/mL). A  negative result must be combined with ?clinical observations, patient history, and epidemiological ?information. The expected result is Negative. ? ?Fact Sheet for Patients:  ?AppsDaily.co.uk

## 2021-04-30 NOTE — Progress Notes (Signed)
SATURATION QUALIFICATIONS: (This note is used to comply with regulatory documentation for home oxygen)  Patient Saturations on Room Air at Rest = 100%  Patient Saturations on Room Air while Ambulating = 93%   

## 2021-04-30 NOTE — Progress Notes (Signed)
Physical Therapy Treatment ?Patient Details ?Name: LAKENYA RIENDEAU ?MRN: [id number] ?DOB: 21-Nov-1928 ?Today's Date: 04/30/2021 ? ? ?History of Present Illness 88 yo female admitted with dyspnea, CHF, increased troponin. Hx of L hip reduction with acetabular revision 04/2021, CHF, CAD, fibromyalgia, R THA, TKA, falls, CKD, gout ? ?  ?PT Comments  ? ? Pt is progressing toward acute PT goals this session with progression to stair training and increased ambulation distance. Pt ambulated ~143f with multiple standing rest breaks, MIN guard progressing to supervision, no LOB observed. Pt performed stair negotiation with MIN guard and intermittent cues for sequencing and safe hand placement. Pt will benefit from continued skilled PT to increase independence and maximize safety with mobility.  ?   ?Recommendations for follow up therapy are one component of a multi-disciplinary discharge planning process, led by the attending physician.  Recommendations may be updated based on patient status, additional functional criteria and insurance authorization. ? ?Follow Up Recommendations ? Home health PT ?  ?  ?Assistance Recommended at Discharge Intermittent Supervision/Assistance  ?Patient can return home with the following A little help with walking and/or transfers;A little help with bathing/dressing/bathroom;Help with stairs or ramp for entrance;Assist for transportation;Assistance with cooking/housework ?  ?Equipment Recommendations ? None recommended by PT  ?  ?Recommendations for Other Services   ? ? ?  ?Precautions / Restrictions Precautions ?Precautions: Posterior Hip;Fall ?Precaution Comments: posterior hip precuations 04/08/21 after hip revision ?Restrictions ?Weight Bearing Restrictions: No ?LLE Weight Bearing: Weight bearing as tolerated  ?  ? ?Mobility ? Bed Mobility ?Overal bed mobility: Modified Independent ?Bed Mobility: Supine to Sit ?  ?  ?Supine to sit: Modified independent (Device/Increase time) ?  ?  ?General bed  mobility comments: increased time ?  ? ?Transfers ?Overall transfer level: Needs assistance ?Equipment used: Rolling walker (2 wheels) ?Transfers: Sit to/from Stand ?Sit to Stand: Supervision ?  ?  ?  ?  ?  ?General transfer comment: Pt able to verbalize posterior hip precautions ?  ? ?Ambulation/Gait ?Ambulation/Gait assistance: Supervision ?Gait Distance (Feet): 140 Feet ?Assistive device: Rolling walker (2 wheels) ?Gait Pattern/deviations: Step-through pattern, Decreased stride length, Antalgic ?Gait velocity: decr ?  ?  ?General Gait Details: x3 brief standing rest breaks during due to SOB/fatigue. Gait mildly antalgic due to history of L hip surgery. Pt denied dizziness. O2 99-100% on RA. ? ? ?Stairs ?Stairs: Yes ?Stairs assistance: Min guard ?Stair Management: One rail Left, Forwards ?Number of Stairs: 4 ?General stair comments: B UEs used on rail when ascending, pt with  good recall "up with the good, down with the bad". HHA when descending stairs with rail on opposite side, lives with son has other son coming into town who can assist with stair negotiation. ? ? ?Wheelchair Mobility ?  ? ?Modified Rankin (Stroke Patients Only) ?  ? ? ?  ?Balance Overall balance assessment: Needs assistance, History of Falls ?Sitting-balance support: Feet supported ?Sitting balance-Leahy Scale: Fair ?  ?  ?Standing balance support: During functional activity, Bilateral upper extremity supported ?Standing balance-Leahy Scale: Fair ?  ?  ?  ?  ?  ?  ?  ?  ?  ?  ?  ?  ?  ? ?  ?Cognition Arousal/Alertness: Awake/alert ?[facility]: [facility] ?Overall Cognitive Status: Within Functional Limits for tasks assessed ?  ?  ?  ?  ?  ?  ?  ?  ?  ?  ?  ?  ?  ?  ?  ?  ?  ?  ?  ? ?  ?  Exercises   ? ?  ?General Comments   ?  ?  ? ?Pertinent Vitals/Pain Pain Assessment ?Pain Assessment: No/denies pain  ? ? ?Home Living   ?  ?  ?  ?  ?  ?  ?  ?  ?  ?   ?  ?Prior Function    ?  ?  ?   ? ?PT Goals (current goals  can now be found in the care plan section) Acute Rehab PT Goals ?Patient Stated Goal: regain PLOF/independence. ?PT Goal Formulation: With patient/family ?Time For Goal Achievement: 05/12/21 ?Potential to Achieve Goals: Good ?Progress towards PT goals: Progressing toward goals ? ?  ?Frequency ? ? ? Min 3X/week ? ? ? ?  ?PT Plan Current plan remains appropriate  ? ? ?Co-evaluation   ?  ?  ?  ?  ? ?  ?AM-PAC PT "6 Clicks" Mobility   ?Outcome Measure ? Help needed turning from your back to your side while in a flat bed without using bedrails?: A Little ?Help needed moving from lying on your back to sitting on the side of a flat bed without using bedrails?: A Little ?Help needed moving to and from a bed to a chair (including a wheelchair)?: A Little ?Help needed standing up from a chair using your arms (e.g., wheelchair or bedside chair)?: A Little ?Help needed to walk in hospital room?: A Little ?Help needed climbing 3-5 steps with a railing? : A Little ?6 Click Score: 18 ? ?  ?End of Session Equipment Utilized During Treatment: Gait belt ?Activity Tolerance: Patient tolerated treatment well ?Patient left: in chair;with call bell/phone within reach ?Nurse Communication: Mobility status ?PT Visit Diagnosis: Unsteadiness on feet (R26.81);Muscle weakness (generalized) (M62.81);History of falling (Z91.81) ?  ? ? ?Time: 8563-1497 ?PT Time Calculation (min) (ACUTE ONLY): 29 min ? ?Charges:  $Therapeutic Activity: 23-37 mins          ?          ? ?Festus Barren., PT, DPT  ?Acute Rehabilitation Services  ?Office [phone] ? ? ?04/30/2021, 4:14 PM ? ?

## 2021-04-30 NOTE — Progress Notes (Addendum)
? ?Progress Note ? ?Patient Name: Karen [name] ?Date of Encounter: 04/30/2021 ? ?[facility] Cardiologist: Sanda Klein, MD  ? ?Subjective  ? ?Denies any CP. Single episode of SOB yesterday lasting 30 secs.  ? ?Inpatient Medications  ?  ?Scheduled Meds: ? amoxicillin-clavulanate  1 tablet Oral Q12H  ? aspirin  81 mg Oral Daily  ? atorvastatin  40 mg Oral QHS  ? bupivacaine-EPINEPHrine  10 mL Infiltration Once  ? cyanocobalamin  1,000 mcg Intramuscular Daily  ? insulin aspart  0-5 Units Subcutaneous QHS  ? insulin aspart  0-9 Units Subcutaneous TID WC  ? insulin aspart  4 Units Subcutaneous TID WC  ? insulin glargine-yfgn  10 Units Subcutaneous BID  ? metoprolol succinate  50 mg Oral Daily  ? mirtazapine  15 mg Oral QHS  ? QUEtiapine  200 mg Oral QHS  ? ?Continuous Infusions: ? ?PRN Meds: ?acetaminophen **OR** acetaminophen, hydrALAZINE, HYDROcodone-acetaminophen, lip balm, nitroGLYCERIN, senna-docusate  ? ?Vital Signs  ?  ?Vitals:  ? 04/29/21 2008 04/30/21 0448 04/30/21 1023 04/30/21 1358  ?BP: (!) 143/73 (!) 143/64 122/63 111/68  ?Pulse: 87 88 88 89  ?Resp: '16 16  16  '$ ?Temp: 98.2 ?F (36.8 ?C) 97.7 ?F (36.5 ?C)  97.8 ?F (36.6 ?C)  ?TempSrc: Oral Oral  Oral  ?SpO2: 100% 98%  100%  ?Weight:      ?Height:      ? ? ?Intake/Output Summary (Last 24 hours) at 04/30/2021 1501 ?Last data filed at 04/30/2021 1428 ?Gross per 24 hour  ?Intake 600 ml  ?Output 200 ml  ?Net 400 ml  ? ? ?  04/26/2021  ?  2:44 AM 04/25/2021  ?  9:52 PM 04/08/2021  ?  4:26 PM  ?Last 3 Weights  ?Weight (lbs) 147 lb 0.8 oz 139 lb 12.4 oz 140 lb  ?Weight (kg) 66.7 kg 63.4 kg 63.504 kg  ?   ? ?Telemetry  ?  ?NSR without significant ventricular ectopy - Personally Reviewed ? ?ECG  ?  ?NSR without significant ST-T wave changes - Personally Reviewed ? ?Physical Exam  ? ?GEN: No acute distress.   ?Neck: No JVD ?Cardiac: RRR, no murmurs, rubs, or gallops.  ?Respiratory: Clear to auscultation bilaterally. ?GI: Soft, nontender, non-distended  ?MS: No edema;  No deformity. ?Neuro:  Nonfocal  ?Psych: Normal affect  ? ?Labs  ?  ?High Sensitivity Troponin:   ?Recent Labs  ?Lab 04/25/21 ?2257 04/26/21 ?0056 04/26/21 ?3817 04/26/21 ?0500  ?TROPONINIHS 192* 180* 146* 151*  ?   ?Chemistry ?Recent Labs  ?Lab 04/25/21 ?2257 04/26/21 ?0313  ?NA 133* 132*  ?K 4.7 4.9  ?CL 101 104  ?CO2 20* 17*  ?GLUCOSE 357* 318*  ?BUN 39* 36*  ?CREATININE 1.75* 1.60*  ?CALCIUM 9.1 8.5*  ?PROT 6.8  --   ?ALBUMIN 3.3*  --   ?AST 24  --   ?ALT 19  --   ?ALKPHOS 74  --   ?BILITOT 0.4  --   ?GFRNONAA 27* 30*  ?ANIONGAP 12 11  ?  ?Lipids No results for input(s): CHOL, TRIG, HDL, LABVLDL, LDLCALC, CHOLHDL in the last 168 hours.  ?Hematology ?Recent Labs  ?Lab 04/25/21 ?2257 04/26/21 ?7116 04/26/21 ?5790 04/29/21 ?3833  ?WBC 9.8 7.6  --  7.8  ?RBC 3.31* 3.07* 3.11* 3.08*  ?HGB 11.2* 10.4*  --  10.5*  ?HCT 34.2* 32.0*  --  31.4*  ?MCV 103.3* 104.2*  --  101.9*  ?MCH 33.8 33.9  --  34.1*  ?MCHC 32.7 32.5  --  33.4  ?RDW 14.8 14.7  --  15.2  ?PLT 262 252  --  262  ? ?Thyroid No results for input(s): TSH, FREET4 in the last 168 hours.  ?BNP ?Recent Labs  ?Lab 04/25/21 ?2257  ?BNP 162.2*  ?  ?DDimer No results for input(s): DDIMER in the last 168 hours.  ? ?Radiology  ?  ?No results found. ? ?Cardiac Studies  ? ?Echo 04/27/2021 ? 1. Distal septal hypokinesis . Left ventricular ejection fraction, by  ?estimation, is 50 to 55%. The left ventricle has low normal function. The  ?left ventricle demonstrates regional wall motion abnormalities (see  ?scoring diagram/findings for  ?description). There is mild asymmetric left ventricular hypertrophy of the  ?basal and septal segments. Left ventricular diastolic parameters are  ?indeterminate.  ? 2. Right ventricular systolic function is normal. The right ventricular  ?size is normal.  ? 3. Left atrial size was mildly dilated.  ? 4. The mitral valve is degenerative. Trivial mitral valve regurgitation.  ?No evidence of mitral stenosis. Moderate mitral annular  calcification.  ? 5. The aortic valve is tricuspid. There is moderate calcification of the  ?aortic valve. There is moderate thickening of the aortic valve. Aortic  ?valve regurgitation is trivial. Aortic valve sclerosis/calcification is  ?present, without any evidence of  ?aortic stenosis.  ? 6. The inferior vena cava is normal in size with greater than 50%  ?respiratory variability, suggesting right atrial pressure of 3 mmHg.  ? ?Patient Profile  ?   ?88 y.o. female with PMH of CAD, chronic diastolic CHF, HTN, DM II and ascending aortic aneurysm who presented with SOB and found to have elevated troponin and abnormal echo with RWMA. She recently underwent L total hip surgery complicated by post op hematoma that required drainage on 3/14. VQ scan showed no PE. Echo showed EF 50% with RWMA.  ? ?Assessment & Plan  ?  ?Episodic SOB with elevated troponin ? - Serial trop 192 --> 180 --> 146 --> 151. New WMA on echo ?-  Dr. Margaretann Loveless previously discussed cardiac catheterization with the patient, however given the recently bleeding from I&D site and recent L total hip repair with subsequent hematoma. Patient prefers to delay the procedure as outpatient ? - continue ASA, lipitor, and Toprol XL. Start on plavix 75 mg daily prior to discharge.  ? ?Abnormal echocardiogram ? - Echo showed distal septal hypokinesis.  ? ?Recent hip surgery ? ?Perirectal abscess: underwent I&D. On Abx ? ?HTN ? ?DM II ? ?CKD stage III ? ?   ? ?For questions or updates, please contact [place] ?Please consult [email or web address] for contact info under  ? ?  ?   ?Signed, ?Almyra Deforest, [place]  ?04/30/2021, 3:01 PM    ? ?Patient seen and examined with Almyra Deforest PA.  Agree as above, with the following exceptions and changes as noted below. Feeling well today, no bleeding. Gen: NAD, CV: RRR, no murmurs, Lungs: clear, Abd: soft, Extrem: Warm, well perfused, no edema, Neuro/Psych: alert and oriented x 3, normal mood and affect. All available labs, radiology testing,  previous records reviewed. Patient and son have decided in shared decision making to defer cath at this time. I have advised medical management of NSTEMI with asa 81 mg daily and clopidogrel 75 mg daily. She continues to have mild exertional shortness of breath but she and her son would like a trial of increased conditioning prior to pursuing cath. Continue remainder of medical therapy as above. Follow up will be arranged.  Cardiology to sign off.  ? ?Elouise Munroe, MD ?04/30/21 7:53 PM ? ?

## 2021-05-01 DIAGNOSIS — R0602 Shortness of breath: Secondary | ICD-10-CM | POA: Diagnosis not present

## 2021-05-01 DIAGNOSIS — R778 Other specified abnormalities of plasma proteins: Secondary | ICD-10-CM | POA: Diagnosis not present

## 2021-05-01 DIAGNOSIS — N1831 Chronic kidney disease, stage 3a: Secondary | ICD-10-CM | POA: Diagnosis not present

## 2021-05-01 DIAGNOSIS — I5042 Chronic combined systolic (congestive) and diastolic (congestive) heart failure: Secondary | ICD-10-CM | POA: Diagnosis not present

## 2021-05-01 LAB — GLUCOSE, CAPILLARY
Glucose-Capillary: 155 mg/dL — ABNORMAL HIGH (ref 70–99)
Glucose-Capillary: 172 mg/dL — ABNORMAL HIGH (ref 70–99)

## 2021-05-01 MED ORDER — ASPIRIN 81 MG PO CHEW: 81.0000 mg | CHEWABLE_TABLET | Freq: Every day | ORAL | Status: DC

## 2021-05-01 MED ORDER — CLOPIDOGREL BISULFATE 75 MG PO TABS: 75.0000 mg | ORAL_TABLET | Freq: Every day | ORAL | 3 refills | Status: DC

## 2021-05-01 MED ORDER — AMOXICILLIN-POT CLAVULANATE 500-125 MG PO TABS
1.0000 | ORAL_TABLET | Freq: Two times a day (BID) | ORAL | 0 refills | Status: AC
Start: 2021-05-01 — End: 2021-05-06

## 2021-05-01 MED ORDER — NITROGLYCERIN 0.4 MG SL SUBL: 0.4000 mg | SUBLINGUAL_TABLET | SUBLINGUAL | 12 refills | Status: DC | PRN

## 2021-05-01 NOTE — TOC Transition Note (Signed)
Transition of Care (TOC) - CM/SW Discharge Note ? ? ?Patient Details  ?Name: TAIS KOESTNER ?MRN: [id number] ?Date of Birth: [birth year] ? ?Transition of Care (TOC) CM/SW Contact:  ?Ross Ludwig, LCSW ?Phone Number: ?05/01/2021, 1:16 PM ? ? ?Clinical Narrative:    ? ?Patient will be going home with home health PT and OT through [facility]. CSW signing off please reconsult with any other social work needs, home health agency has been notified of planned discharge. ? ? ? ?Final next level of care: [place] ?Barriers to Discharge: Barriers Resolved ? ? ?Patient Goals and CMS Choice ?Patient states their goals for this hospitalization and ongoing recovery are:: To return back home with home health. ?CMS Medicare.gov Compare Post Acute Care list provided to:: Patient Represenative (must comment) ?Choice offered to / list presented to : Adult Children ? ?Discharge Placement ?  ?           ?  ?  ?  ?  ? ?Discharge Plan and Services ?  ?Discharge Planning Services: CM Consult ?Post Acute Care Choice: Durable Medical Equipment          ?  ?  ?  ?  ?  ?HH Arranged: PT, OT ?[place] Agency: [place] ?Date HH Agency Contacted: 05/01/21 ?Time [place]: [id number] ?Representative spoke with at [place]: Tommi Rumps ? ?Social Determinants of Health (SDOH) Interventions ?  ? ? ?Readmission Risk Interventions ?   ? View : No data to display.  ?  ?  ?  ? ? ? ? ? ?

## 2021-05-01 NOTE — Plan of Care (Signed)
  Problem: Clinical Measurements: Goal: Respiratory complications will improve Outcome: Adequate for Discharge   

## 2021-05-01 NOTE — Discharge Summary (Addendum)
Physician Discharge Summary  ?Karen [name] ZOX:[id number] DOB: [birth year] DOA: 04/25/2021 ? ?PCP: [name], DO ? ?Admit date: 04/25/2021 ?Discharge date: 05/01/2021 ? ?Admitted From: Home ?Disposition:  Home ? ?Recommendations for Outpatient Follow-up:  ?Follow up with PCP in 1-2 weeks ?Please obtain BMP/CBC in one week ?Follow up with cards in 2-4 weeks outpatient cath if patient changes her mind. ? ? ?Home Health:Yes ?Equipment/Devices:None ?Discharge Condition:Stable ?CODE STATUS:Full ?Diet recommendation: Heart Healthy ? ?Brief/Interim Summary: ? 88 y.o. female past medical history of CAD status post stenting, chronic diastolic heart failure, chronic kidney stage IIIb, diabetes mellitus, status post l left total hip arthroplasty revision on 04/09/2021, discharged home on aspirin for DVT prophylaxis complicated by hematoma needing drain on 04/21/2021 presents with shortness of breath unable to lay flat there is concern for an acute pulmonary emboli but CTA not done due to renal dysfunction, VQ scan showed no PE, on admission cardiac biomarkers were slightly elevated 2D echo was done that showed an EF of 50% with regional wall motion abnormalities no aortic stenosis and cardiology was consulted for evaluation. ? ?Discharge Diagnoses:  ?Principal Problem: ?  SOB (shortness of breath) ?Active Problems: ?  Essential hypertension ?  Chronic combined systolic and diastolic congestive heart failure ([place]) ?  CAD (coronary artery disease) ?  Elevated troponin ?  Uncontrolled type 2 diabetes mellitus with hyperglycemia ([place]) ?  CKD (chronic kidney disease) stage 3, GFR 30-59 ml/min (H[facility]) ?  Macrocytic anemia ?  Acute hypoxemic respiratory failure ([place]) ? ?Elevated troponin/abnormal echo and shortness of breath: ?Cardiac enzymes were cycled which remained basically flat. ?2D echo showed an EF of 60% with regional wall motion abnormalities no aortic stenosis ?Cardiology was consulted recommended conservative management and  they recommended to continue statins, beta-blocker, aspirin, Plavix and nitroglycerin as needed. ?She had no episodes of shortness of breath 48 hours prior to discharge. ?It was discussed with the family and patient the need for the intervention he elected to conservative management to see how she did at home and after her hip is reevaluated they will discuss with cardiology whether to proceed with cardiac cath as an outpatient. ? ?Recent hip surgery with new left hip seroma/hematoma: ?Patient has an area of induration in the left buttock nontender to palpation x-ray was unremarkable. ?Orthopedic surgery was consulted and relates there is no signs of infection and her hemoglobin is stable they relate to the and the patient agreed to continue conservative management with rest for 2 weeks and see if it starts to resolve they have a follow-up appointment with orthopedics on 05/05/2021. ? ?Perirectal abscess: ?Start empirically on oral Augmentin she remained afebrile general surgery was consulted to perform I&D at bedside. ?They recommended sitz bath and continue Augmentin for a total of 7 days follow-up with them as an outpatient no signs of overt bleeding. ? ?Essential hypertension: ?No change made to his medication. ? ?Macrocytic anemia: ?B12 was 174 she was started on B12 repletion. ? ?Diabetes mellitus type 2: ?Continue current regimen no changes made. ? ?Chronic kidney disease stage IIIb: ?Creatinine remained at baseline. ? ?Discharge Instructions ? ?Discharge Instructions   ? ? Diet - low sodium heart healthy   Complete by: As directed ?  ? Increase activity slowly   Complete by: As directed ?  ? No wound care   Complete by: As directed ?  ? ?  ? ?Allergies as of 05/01/2021   ? ?   Reactions  ? Lisinopril Cough  ? Ace inhibitor  ?  Pneumococcal Vaccines Swelling, Other (See Comments)  ? Very bad pain and swelling in the arm of the shot, needed 2 cortisone shots.  ? Antihistamines, Diphenhydramine-type Other (See  Comments)  ? Carvedilol Other (See Comments)  ? Weakness, dizziness, upset stomach, trembling   ? Codeine Nausea And Vomiting  ? Morphine And Related Nausea And Vomiting  ? Ultram [tramadol Hcl] Itching  ? ?  ? ?  ?Medication List  ?  ? ?STOP taking these medications   ? ?amLODipine 5 MG tablet ?Commonly known as: NORVASC ?  ?aspirin 325 MG EC tablet ?Replaced by: aspirin 81 MG chewable tablet ?  ?meloxicam 7.5 MG tablet ?Commonly known as: MOBIC ?  ? ?  ? ?TAKE these medications   ? ?acetaminophen 650 MG CR tablet ?Commonly known as: TYLENOL ?Take 650 mg by mouth at bedtime as needed (arthritis pain). ?  ?amoxicillin-clavulanate 500-125 MG tablet ?Commonly known as: AUGMENTIN ?Take 1 tablet (500 mg total) by mouth every 12 (twelve) hours for 5 days. ?  ?aspirin 81 MG chewable tablet ?Chew 1 tablet (81 mg total) by mouth daily. ?Replaces: aspirin 325 MG EC tablet ?  ?atorvastatin 40 MG tablet ?Commonly known as: LIPITOR ?Take 40 mg by mouth at bedtime. ?  ?Calcium Carbonate-Vitamin D 600-400 MG-UNIT tablet ?Take 1 tablet by mouth every morning. ?  ?clopidogrel 75 MG tablet ?Commonly known as: PLAVIX ?Take 1 tablet (75 mg total) by mouth daily. ?  ?Co Q 10 100 MG Caps ?Take 300 mg by mouth every morning. ?  ?furosemide 20 MG tablet ?Commonly known as: LASIX ?Take 1 tablet (20 mg total) by mouth daily. ?  ?glipiZIDE 5 MG 24 hr tablet ?Commonly known as: GLUCOTROL XL ?Take 10 mg by mouth every morning. ?  ?HYDROcodone-acetaminophen 5-325 MG tablet ?Commonly known as: NORCO/VICODIN ?Take 1-2 tablets by mouth every 6 (six) hours as needed for severe pain (pain score 4-6). ?What changed:  ?how much to take ?when to take this ?  ?Magnesium 250 MG Tabs ?Take 250 mg by mouth every morning. ?  ?metFORMIN 1000 MG tablet ?Commonly known as: GLUCOPHAGE ?Take 1 tablet (1,000 mg total) by mouth 2 (two) times daily with a meal. Hold for 2 days, restart on 10/14/2013 ?What changed:  ?when to take this ?additional instructions ?   ?metoprolol succinate 50 MG 24 hr tablet ?Commonly known as: TOPROL-XL ?Take 50 mg by mouth every morning. Take with or immediately following a meal. ?  ?mirtazapine 15 MG tablet ?Commonly known as: REMERON ?Take 15 mg by mouth at bedtime. ?  ?multivitamin with minerals Tabs tablet ?Take 1 tablet by mouth every morning. ?  ?nitroGLYCERIN 0.4 MG SL tablet ?Commonly known as: NITROSTAT ?Place 1 tablet (0.4 mg total) under the tongue every 5 (five) minutes as needed for chest pain (or SOB). ?  ?potassium chloride SA 20 MEQ tablet ?Commonly known as: KLOR-CON M ?Take 1 tablet (20 mEq total) by mouth 2 (two) times daily. ?What changed: when to take this ?  ?QUEtiapine 200 MG tablet ?Commonly known as: SEROQUEL ?Take 200 mg by mouth at bedtime. ?  ?Systane Ultra 0.4-0.3 % Soln ?Generic drug: Polyethyl Glycol-Propyl Glycol ?Place 1 drop into both eyes daily as needed (dry eyes). ?  ?valsartan 320 MG tablet ?Commonly known as: DIOVAN ?Take 1 tablet (320 mg total) by mouth daily. ?What changed: when to take this ?  ? ?  ? ? Follow-up Information   ? ? Leighton Ruff, MD Follow up on 05/26/2021.   ?  Specialties: General Surgery, Colon and Rectal Surgery ?Why: 1020am. Please arrive 30 minutes prior to your appointment for paperwork. Please bring a copy of your photo ID and insurance card. ?Contact information: ?[address] ?[address] ?[place] [phone] ?[phone] ? ? ?  ?  ? ?  ?  ? ?  ? ?Allergies  ?Allergen Reactions  ? Lisinopril Cough  ?  Ace inhibitor  ? Pneumococcal Vaccines Swelling and Other (See Comments)  ?  Very bad pain and swelling in the arm of the shot, needed 2 cortisone shots.  ? Antihistamines, Diphenhydramine-Type Other (See Comments)  ? Carvedilol Other (See Comments)  ?  Weakness, dizziness, upset stomach, trembling   ? Codeine Nausea And Vomiting  ? Morphine And Related Nausea And Vomiting  ? Ultram [Tramadol Hcl] Itching  ? ? ?Consultations: ?Cardiology ?Orthopedic  surgery ? ? ?Procedures/Studies: ?DG Chest 2 View ? ?Result Date: 04/27/2021 ?CLINICAL DATA:  A 88 year old female presents following lung scan. EXAM: CHEST - 2 VIEW COMPARISON:  April 25, 2021. FINDINGS: There is ectasia of the thoracic a

## 2021-05-01 NOTE — Progress Notes (Signed)
D/C instructions reviewed w/ pt and son. Both verbalize understanding and all questions answered. Pt son in possession of d/c packet and all personal belongings. Awaiting arrival of another son around 81 for ride home.  ?

## 2021-05-05 DIAGNOSIS — Z5189 Encounter for other specified aftercare: Secondary | ICD-10-CM | POA: Diagnosis not present

## 2021-05-09 NOTE — Progress Notes (Signed)
?Cardiology Office Note:   ? ?Date:  05/15/2021  ? ?ID:  Karen [name], DOB 10-Nov-1928, MRN [id number] ? ?PCP:  [name], DO  ?Cardiologist:  Sanda Klein, MD  ?Electrophysiologist:  None  ? ?Referring MD: [name], DO  ? ?Chief Complaint: hospital follow-up of NSTEMI ? ?History of Present Illness:   ? ?Karen [name] is a 88 y.o. female with a history of CAD s/p DES to LAD and LCX in 05/2704, chronic diastolic CHF, ascending aortic aneurysm, hypertension, type 2 diabetes, fibromyalgia, and arthritis who is followed by Dr. Sallyanne Kuster and presents today for hospital follow-up for NSTEMI. ? ?Patient has a history of CAD with LHC in 04/2013 showing 30-40% stenosis of distal left main, 40% stenosis of proximal LAD followed by 90% stenosis of mid to distal LAD, 70-90% stenosis of mid to distal LCx, 40-50% of mid RCA, and 20-80% stenosis of PDA. She underwent successful PCI with DES to LAD and LCx lesions. Echo prior to cath in 03/2013 showed mildly reduced LVEF of 45-50% with sepal and apical hypokinesis and LV gram on cath showed LVEF of 50%. Repeat Echo in 04/2020 showed improvement of LVEF to 60-65% with normal wall motion and severe LVH of the basal-septal segment as well as moderate mitral stenosis and mild AI. Patient was last seen by Dr. Sallyanne Kuster in 07/2020 at which time she was doing well from a cardiac standpoint. She had routine CTA later that month for routine monitoring of thoracic aortic aneurysm which showed a stable 4.8 cm ascending thoracic aortic aneurysm and a stable 4.3 cm aneurysm dilation of the distal descending thoracic aorta. ? ?Since last visit, patient has had 2 recent hospitalizations.  She was admitted from 04/08/2021 to 04/11/2021 for a left hip dislocation following a mechanical fall where she slipped in the shower.  She was seen by Ortho and ultimately underwent a left total hip revision on 04/09/2021.  She initially did well postoperatively and was discharged on aspirin for DVT  prophylaxis but developed a hematoma with this requiring drainage on 04/21/2021.  She was then readmitted from 04/25/2021 to 05/01/2021 after presenting with shortness of breath and inability to lay flat.  There was concern for PE given recent lower extremity surgery but CTA was not done due to underlying renal function.  However, VQ scan showed no PE.  Patient ultimately reported a 51-monthhistory of dyspnea on exertion but denied any chest pain.  High-sensitivity troponin was mildly elevated but flat peaking at 192.  BNP was mildly elevated at 162.  Chest x-ray showed no signs of edema.  Echo showed LVEF of 50-55% with distal septal hypokinesis, mild asymmetric LVH of the basal and septal segments and indeterminate diastolic parameters.  Of note and no mitral stenosis was noted on this Echo.  She was given a dose of IV Lasix with some improvement in her breathing.  Given new dyspnea on exertion and new wall motion abnormality in distribution of prior stent, there was concern for ischemia.  Cardiac catheterization was discussed but decision was made to defer this and see outpatient does with increased conditioning at home.  She was discharged on DAPT with aspirin and Plavix for medical management of NSTEMI. ? ?Patient presents today for follow-up. Here with son.  Patient denies any chest pain but  reports persistent shortness of breath since being discharged from the hospital.  She has dyspnea with minimal activity such as walking to the bathroom or the kitchen, showering/bathing, and even washing her hands  at the sink.  This is new for her.  She was not having this prior to her most recent hospitalization.  She does state that stopping to take big deep breaths will sometimes help but she also has intermittent "attacks" where she becomes acutely short of breath with increased respiratory rate.  This normally occurs with activity.  She tried taking sublingual nitroglycerin for this but it seemed to make the attacks  worse. No history of COPD or asthma. She describes some orthopnea and states when she first lays down at night she has to sleep on an incline but then after about 2 hours she can go back to 1 pillow.  No PND.  She does think the shortness of breath has been getting better the last couple days and she says that these attacks have been occurring less frequently.  She has had some lower extremity edema that is worse on her left leg after recent hip surgery but it has been getting better.  She continues to have problems with recurrent hematoma of the left hip.  She has had to have it drained twice and she is going back to Dr. Wynelle Link soon where she may have to have it drained again.  She denies any significant palpitations, lightheadedness, dizziness, syncope but does state she is having a lot of weakness.  She has also had a lot of diarrhea since discharge.  She states she is normally okay in the morning but will have diarrhea in the afternoon.  She has had some irritation because of these frequent bowel movements and has noticed a couple of spots of blood a few times but otherwise no abnormal bleeding in urine or stools. Diarrhea seems to be slowly improving as well. ? ?Patient states she is going back to living alone on Monday and was wondering if she could have a home health aide come out a couple times a week to help her with things like showering and meals.  I am okay with this and will place referral. ? ?Past Medical History:  ?Diagnosis Date  ? Arthritis   ? CHF (congestive heart failure) ([place])   ? Constipation   ? Coronary artery disease 04/2013  ? Three-vessel disease, initial medical therapy then stenting of the LAD and CFX 10/2013  ? Diabetes mellitus   ? TYPE 2  ? Fibromyalgia   ? H/O: gout   ? Heart murmur   ? Hx: UTI (urinary tract infection)   ? Hypertension   ? ? ?Past Surgical History:  ?Procedure Laterality Date  ? APPENDECTOMY  1951  ? [place]  ? CARDIAC CATHETERIZATION  04/2013  ? Left  main 30-40%, prox LAD 40%, mid/distal LAD 90%, CFX 70%, OM 3 90%, RCA 40-50%, PDA 20-80%, EF 50%   ? CHOLECYSTECTOMY  1951  ? Closed reduction of Left Total Hip  2008  ? x 3  ? CORONARY STENT PLACEMENT  10/11/2013  ? 2.25 x 20 mm Promus DES to mid/distal LAD & 3.5 x 12 mm Promus DES Mid CX         DR [place]  ? [place]  ? JOINT REPLACEMENT  left hip-2007, left knee-2000, right knee-1997  ? Left Hip, Bilateral Knee replacements  ? KNEE SURGERY  1997/2000  ? LEFT HEART CATHETERIZATION WITH CORONARY ANGIOGRAM N/A 04/25/2013  ? Procedure: LEFT HEART CATHETERIZATION WITH CORONARY ANGIOGRAM;  Surgeon: Sanda Klein, MD;  Location: [facility] C[facility] LAB;  Service: Cardiovascular;  Laterality: N/A;  ? PERCUTANEOUS  CORONARY STENT INTERVENTION (PCI-S) N/A 10/11/2013  ? Procedure: PERCUTANEOUS CORONARY STENT INTERVENTION (PCI-S);  Surgeon: Peter M Martinique, MD;  [facility];  Service: Cardiovascular;  Laterality: N/A;  ? REPLACEMENT TOTAL HIP W/  RESURFACING IMPLANTS  [id number]  ? Right Foot Surgery  2004  ? x 2  ? Salivary Gland Stone Extraction    ? SPINAL FUSION  2006  ? TOTAL HIP ARTHROPLASTY Right 05/14/2020  ? Procedure: TOTAL HIP ARTHROPLASTY ANTERIOR APPROACH;  Surgeon: Gaynelle Arabian, MD;  Location: [facility] ORS;  Service: Orthopedics;  Laterality: Right;  176mn  ? TOTAL HIP REVISION Left 04/08/2021  ? Procedure: TOTAL HIP REVISION;  Surgeon: AGaynelle Arabian MD;  Location: [facility] ORS;  Service: Orthopedics;  Laterality: Left;  ? ? ?Current Medications: ?Current Meds  ?Medication Sig  ? acetaminophen (TYLENOL) 650 MG CR tablet Take 650 mg by mouth at bedtime as needed (arthritis pain).  ? aspirin 81 MG chewable tablet Chew 1 tablet (81 mg total) by mouth daily.  ? atorvastatin (LIPITOR) 40 MG tablet Take 40 mg by mouth at bedtime.  ? Calcium Carbonate-Vitamin D 600-400 MG-UNIT tablet Take 1 tablet by mouth every morning.  ? clopidogrel (PLAVIX) 75 MG tablet Take 1 tablet (75 mg total) by mouth daily.  ? Coenzyme Q10 (CO Q  10) 100 MG CAPS Take 300 mg by mouth every morning.  ? furosemide (LASIX) 20 MG tablet Take 1 tablet (20 mg total) by mouth daily.  ? glipiZIDE (GLUCOTROL XL) 5 MG 24 hr tablet Take 10 mg by mouth every morning.  ?

## 2021-05-15 ENCOUNTER — Ambulatory Visit (INDEPENDENT_AMBULATORY_CARE_PROVIDER_SITE_OTHER): Payer: Medicare Other | Admitting: Student

## 2021-05-15 ENCOUNTER — Encounter: Payer: Self-pay | Admitting: Student

## 2021-05-15 VITALS — BP 116/72 | HR 88 | Ht 64.0 in | Wt 142.8 lb

## 2021-05-15 DIAGNOSIS — I5032 Chronic diastolic (congestive) heart failure: Secondary | ICD-10-CM | POA: Diagnosis not present

## 2021-05-15 DIAGNOSIS — N183 Chronic kidney disease, stage 3 unspecified: Secondary | ICD-10-CM | POA: Diagnosis not present

## 2021-05-15 DIAGNOSIS — I10 Essential (primary) hypertension: Secondary | ICD-10-CM | POA: Diagnosis not present

## 2021-05-15 DIAGNOSIS — I2510 Atherosclerotic heart disease of native coronary artery without angina pectoris: Secondary | ICD-10-CM

## 2021-05-15 DIAGNOSIS — D649 Anemia, unspecified: Secondary | ICD-10-CM | POA: Diagnosis not present

## 2021-05-15 DIAGNOSIS — I252 Old myocardial infarction: Secondary | ICD-10-CM | POA: Diagnosis not present

## 2021-05-15 DIAGNOSIS — R0600 Dyspnea, unspecified: Secondary | ICD-10-CM

## 2021-05-15 DIAGNOSIS — E785 Hyperlipidemia, unspecified: Secondary | ICD-10-CM

## 2021-05-15 DIAGNOSIS — I7121 Aneurysm of the ascending aorta, without rupture: Secondary | ICD-10-CM | POA: Diagnosis not present

## 2021-05-15 DIAGNOSIS — E118 Type 2 diabetes mellitus with unspecified complications: Secondary | ICD-10-CM

## 2021-05-15 NOTE — Patient Instructions (Addendum)
Medication Instructions:  ?Your physician recommends that you continue on your current medications as directed. Please refer to the Current Medication list given to you today. ? ?*If you need a refill on your cardiac medications before your next appointment, please call your pharmacy* ? ?Lab Work: ?Your physician recommends that you return for lab work TODAY :  ?BMET ?BNP ?CBC ? ?If you have labs (blood work) drawn today and your tests are completely normal, you will receive your results only by: ?MyChart Message (if you have MyChart) OR ?A paper copy in the mail ?If you have any lab test that is abnormal or we need to change your treatment, we will call you to review the results. ? ?Testing/Procedures: ?NONE ordered at this time of appointment  ? ?Follow-Up: ?At [facility], you and your health needs are our priority.  As part of our continuing mission to provide you with exceptional heart care, we have created designated Provider Care Teams.  These Care Teams include your primary Cardiologist (physician) and Advanced Practice Providers (APPs -  Physician Assistants and Nurse Practitioners) who all work together to provide you with the care you need, when you need it. ? ?Your next appointment:   ?1 month(s) ? ?The format for your next appointment:   ?In Person ? ?Provider:   ?Sande Rives, PA-C      ? ?Other Instructions ? ? ?

## 2021-05-15 NOTE — Addendum Note (Signed)
Addended by: Jacqulynn Cadet on: 05/15/2021 05:15 PM ? ? Modules accepted: Orders ? ?

## 2021-05-16 NOTE — Progress Notes (Signed)
Thank you for the thoughtful note, Callie. ?Not so concerned about bleeding with the cath itself as the possibility of complications while on DAPT for stents afterwards. ?I recommend starting with a Lexiscan Myoview, while we also wait to see if she improves from the deconditioning related to her orthopedic problems. ?Thanks again for seeing her ?

## 2021-05-18 ENCOUNTER — Telehealth: Payer: Self-pay

## 2021-05-18 LAB — CBC
Hematocrit: 32.3 % — ABNORMAL LOW (ref 34.0–46.6)
Hemoglobin: 10.7 g/dL — ABNORMAL LOW (ref 11.1–15.9)
MCH: 33.9 pg — ABNORMAL HIGH (ref 26.6–33.0)
MCHC: 33.1 g/dL (ref 31.5–35.7)
MCV: 102 fL — ABNORMAL HIGH (ref 79–97)
Platelets: 224 10*3/uL (ref 150–450)
RBC: 3.16 x10E6/uL — ABNORMAL LOW (ref 3.77–5.28)
RDW: 13.6 % (ref 11.7–15.4)
WBC: 8 10*3/uL (ref 3.4–10.8)

## 2021-05-18 LAB — BRAIN NATRIURETIC PEPTIDE: BNP: 201.2 pg/mL — ABNORMAL HIGH (ref 0.0–100.0)

## 2021-05-18 LAB — BASIC METABOLIC PANEL
BUN/Creatinine Ratio: 18 (ref 12–28)
BUN: 31 mg/dL (ref 10–36)
CO2: 19 mmol/L — ABNORMAL LOW (ref 20–29)
Calcium: 9.6 mg/dL (ref 8.7–10.3)
Chloride: 102 mmol/L (ref 96–106)
Creatinine, Ser: 1.68 mg/dL — ABNORMAL HIGH (ref 0.57–1.00)
Glucose: 244 mg/dL — ABNORMAL HIGH (ref 70–99)
Potassium: 5.1 mmol/L (ref 3.5–5.2)
Sodium: 137 mmol/L (ref 134–144)
eGFR: 29 mL/min/{1.73_m2} — ABNORMAL LOW (ref >59)

## 2021-05-18 NOTE — Telephone Encounter (Signed)
Karen [name], [place]; Karen [name]; Karen [name], [place]; [place], Erik Obey, LPN; Darreld Mclean, PA-C; 1 other ?Good morning Terrah,  ?We had already left for the day on Friday when this message was sent. Sorry for the delay.  ? ?I checked the patient's Medicare and she is already on service with [facility]. This was ordered through them upon her hospital discharge on 3/24. The order for aide will need to go to them as well. Thanks.  ? ?Angie   ?  ?   ?Previous Messages ? ?----- Message -----  ?From: Jacqulynn Cadet, [place]  ?Sent: 05/15/2021   5:18 PM EDT  ?To: Karen [name], Abelina Bachelor, *  ?Subject: Home health referral                          ? ?Good afternoon,  ? ?Sande Rives, PA-C saw this patient today and order for a Home Health referral for an aide to come to patient's home to assist with showers and meals.  ? ?Thanks,  ? ?Terrah   ?-------------------------------- ?Spoke with [facility] Clarise Cruz) ?She states that per the note in her computer pt declined services stating that her MD told her to stop [facility] PT d/t her CHF on 05-08-21. We think that she is unaware that PT/OT/CNA services is what she needs per Callie's progress noted dated 05-15-21. "Patient requested a home health aide due to the fact that she is going to go back to living alone next week. Her weakness from surgery and profound shortness of breath have made it difficult for her to do certain activities such as bath and shower. She was hoping a home health aide could come by a couple of times a week to help her with this. I think this is very reasonable so I will place referral." ?Tried to call pt, Man answered cell # and states that pt does not have power and he would call back. ? ?

## 2021-05-18 NOTE — Telephone Encounter (Signed)
Patient's son is returning call. 

## 2021-05-18 NOTE — Telephone Encounter (Signed)
Returned call to patient's son.Stated he was calling to get lab results done 4/7.Advised results not available.I will send message to [facility] PA. ?

## 2021-05-19 DIAGNOSIS — Z96641 Presence of right artificial hip joint: Secondary | ICD-10-CM | POA: Diagnosis not present

## 2021-05-19 DIAGNOSIS — Z96642 Presence of left artificial hip joint: Secondary | ICD-10-CM | POA: Diagnosis not present

## 2021-05-19 DIAGNOSIS — Z471 Aftercare following joint replacement surgery: Secondary | ICD-10-CM | POA: Diagnosis not present

## 2021-05-19 DIAGNOSIS — Z96643 Presence of artificial hip joint, bilateral: Secondary | ICD-10-CM | POA: Diagnosis not present

## 2021-05-20 ENCOUNTER — Telehealth: Payer: Self-pay | Admitting: Cardiovascular Disease

## 2021-05-20 DIAGNOSIS — R0609 Other forms of dyspnea: Secondary | ICD-10-CM

## 2021-05-20 DIAGNOSIS — I2510 Atherosclerotic heart disease of native coronary artery without angina pectoris: Secondary | ICD-10-CM

## 2021-05-20 NOTE — Telephone Encounter (Signed)
Patient calling in bout her lab results. Please advise ?

## 2021-05-20 NOTE — Telephone Encounter (Signed)
Contacted patient, aware of lab results- patient states that she will increase lasix for 3 days. Patient also aware you may be contacting her to discuss cardiac PET. I will send message to [facility], patient aware of the stress test need but did advise she would like to discuss further about this. She may like to wait until she is seen 05/03, but I advised Callie would review if this is okay or not.  ? ?Thanks!  ? ? ? ?

## 2021-05-21 NOTE — Telephone Encounter (Signed)
Called patient earlier this evening to discuss Dr. Sallyanne Kuster recommendation for Myoview. The risks [chest pain, shortness of breath, cardiac arrhythmias, dizziness, blood pressure fluctuations, myocardial infarction, stroke/transient ischemic attack, nausea, vomiting, allergic reaction, radiation exposure, metallic taste sensation and life-threatening complications (estimated to be 1 in 10,000)], benefits (risk stratification, diagnosing coronary artery disease, treatment guidance) and alternatives of a nuclear stress test were discussed in detail with Karen [name] and she agrees to proceed. Will order new PET Myoview.  ? ?Also asked patient to keep [place] updated with how her breathing does with increased dose of Lasix. ? ?Sharyn Lull, can you please help me get a PET Myoview ordered? I will go ahead and put in the attestation order. Thank you so much! ? ?Darreld Mclean, PA-C ?05/21/2021 11:34 PM ? ? ? ?

## 2021-05-22 ENCOUNTER — Other Ambulatory Visit: Payer: Self-pay

## 2021-05-22 DIAGNOSIS — I5042 Chronic combined systolic (congestive) and diastolic (congestive) heart failure: Secondary | ICD-10-CM

## 2021-05-22 DIAGNOSIS — R0600 Dyspnea, unspecified: Secondary | ICD-10-CM

## 2021-05-26 DIAGNOSIS — K610 Anal abscess: Secondary | ICD-10-CM | POA: Diagnosis not present

## 2021-05-26 NOTE — Telephone Encounter (Signed)
See other message

## 2021-06-01 NOTE — Telephone Encounter (Signed)
Sent staff message to call pt and schedule ?

## 2021-06-03 ENCOUNTER — Other Ambulatory Visit: Payer: Self-pay | Admitting: Physician Assistant

## 2021-06-03 DIAGNOSIS — M25511 Pain in right shoulder: Secondary | ICD-10-CM

## 2021-06-03 DIAGNOSIS — M19011 Primary osteoarthritis, right shoulder: Secondary | ICD-10-CM | POA: Diagnosis not present

## 2021-06-03 DIAGNOSIS — M75121 Complete rotator cuff tear or rupture of right shoulder, not specified as traumatic: Secondary | ICD-10-CM | POA: Diagnosis not present

## 2021-06-07 NOTE — Progress Notes (Signed)
?Cardiology Office Note:   ? ?Date:  06/11/2021  ? ?ID:  Karen [name], DOB 05-05-1928, MRN [id number] ? ?PCP:  [name], DO  ?Cardiologist:  Sanda Klein, MD  ?Electrophysiologist:  None  ? ?Referring MD: [name], DO  ? ?Chief Complaint: follow-up on dyspnea on exertion ? ?History of Present Illness:   ? ?Karen [name] is a 88 y.o. female with a history of CAD s/p DES to LAD and LCX in 02/270, chronic diastolic CHF, ascending aortic aneurysm, hypertension, type 2 diabetes, fibromyalgia, and arthritis who is followed by Dr. Sallyanne Kuster and presents today for hospital follow-up on dyspnea on exertion. ?  ?Patient has a history of CAD with LHC in 04/2013 showing 30-40% stenosis of distal left main, 40% stenosis of proximal LAD followed by 90% stenosis of mid to distal LAD, 70-90% stenosis of mid to distal LCx, 40-50% of mid RCA, and 20-80% stenosis of PDA. She underwent successful PCI with DES to LAD and LCx lesions. Echo prior to cath in 03/2013 showed mildly reduced LVEF of 45-50% with sepal and apical hypokinesis and LV gram on cath showed LVEF of 50%. Repeat Echo in 04/2020 showed improvement of LVEF to 60-65% with normal wall motion and severe LVH of the basal-septal segment as well as moderate mitral stenosis and mild AI. Routine CTA  in 07/2020 for routine monitoring of thoracic aortic aneurysm which showed a stable 4.8 cm ascending thoracic aortic aneurysm and a stable 4.3 cm aneurysm dilation of the distal descending thoracic aorta. ?  ?Patient has had 2 recent hospitalizations.  She was admitted from 04/08/2021 to 04/11/2021 for a left hip dislocation following a mechanical fall where she slipped in the shower.  She was seen by Ortho and ultimately underwent a left total hip revision on 04/09/2021.  She initially did well postoperatively and was discharged on aspirin for DVT prophylaxis but developed a hematoma with this requiring drainage on 04/21/2021.  She was then readmitted from 04/25/2021 to  05/01/2021 after presenting with shortness of breath and inability to lay flat.  There was concern for PE given recent lower extremity surgery but CTA was not done due to underlying renal function.  However, VQ scan showed no PE.  Patient ultimately reported a 64-monthhistory of dyspnea on exertion but denied any chest pain.  High-sensitivity troponin was mildly elevated but flat peaking at 192.  BNP was mildly elevated at 162.  Chest x-ray showed no signs of edema.  Echo showed LVEF of 50-55% with distal septal hypokinesis, mild asymmetric LVH of the basal and septal segments and indeterminate diastolic parameters.  Of note and no mitral stenosis was noted on this Echo.  She was given a dose of IV Lasix with some improvement in her breathing.  Given new dyspnea on exertion and new wall motion abnormality in distribution of prior stent, there was concern for ischemia.  Cardiac catheterization was discussed but decision was made to defer this and see outpatient does with increased conditioning at home.  She was discharged on DAPT with aspirin and Plavix for medical management of NSTEMI. ? ?She was seen by me on 05/15/2021 for follow-up at which time she reported persistent shortness of breath with minimal activity such as walking to the bathroom or kitchen, showering/bathing, and even washing her hands at the sink. BNP came back mildly elevated so Lasix was increased for 3 days. There was again concern that this could be an anginal equivalent. I discussed with Dr. CSallyanne Kusterand there was  concern about cardiac catheterization and need for potential PCI which would require uninterrupted DAPT given recurrent problems with hip hematoma. Therefore, decision was made to proceed with [facility] first. This has not been done yet. ? ?Patient presents today for follow-up. Here with son. She states her breathing initially improved after last visit. She went 11 days without having any episodes of shortness of breath. However,  she has had multiple brief episodes of shortness of breath since last night. These occur with minimal activity. She took 2 doses of sublingual Nitroglycerin during one of  these episodes earlier today with no real improvement in her breathing. She had one of these episodes during our visit today after standing up briefly. She became tachycardic and mildly tachycardic and had to take deep breaths and then breathing returned to normal after a couple of minutes. O2 sat was 99% during this. Son thinks some of these episodes are related to when she is stressed, frustrated, or anxious. She denies any shortness of breath at rest. She does report having some trouble laying down flat at night initially and having to sleep on an incline; however, this resolves after about 1 hour and she is able to lay flat. This is unchanged from last visit. No PND. She has some lower extremity edema of her left leg but much improved. She denies any chest pain, palpitations, lightheadedness/dizziness, or syncope. Of note, she continues to have problems with recurrent hematoma of left hip after recent surgery. She had it drained again yesterday but states it re-accumulated but later yesterday evening and it is even larger than it previously was. This area is painful. She is also having problems with her right arm and states she is going to need to have her right shoulder replaced sometime next month. ?  ?Past Medical History:  ?Diagnosis Date  ? Arthritis   ? Ascending aortic aneurysm ([place])   ? Chronic diastolic CHF (congestive heart failure) ([place])   ? Constipation   ? Coronary artery disease 04/2013  ? Three-vessel disease, initial medical therapy then stenting of the LAD and CFX 10/2013  ? Diabetes mellitus   ? TYPE 2  ? Fibromyalgia   ? H/O: gout   ? Heart murmur   ? Hx: UTI (urinary tract infection)   ? Hypertension   ? ? ?Past Surgical History:  ?Procedure Laterality Date  ? APPENDECTOMY  1951  ? [place]  ? CARDIAC  CATHETERIZATION  04/2013  ? Left main 30-40%, prox LAD 40%, mid/distal LAD 90%, CFX 70%, OM 3 90%, RCA 40-50%, PDA 20-80%, EF 50%   ? CHOLECYSTECTOMY  1951  ? Closed reduction of Left Total Hip  2008  ? x 3  ? CORONARY STENT PLACEMENT  10/11/2013  ? 2.25 x 20 mm Promus DES to mid/distal LAD & 3.5 x 12 mm Promus DES Mid CX         DR [place]ique  ? [place]  ? JOINT REPLACEMENT  left hip-2007, left knee-2000, right knee-1997  ? Left Hip, Bilateral Knee replacements  ? KNEE SURGERY  1997/2000  ? LEFT HEART CATHETERIZATION WITH CORONARY ANGIOGRAM N/A 04/25/2013  ? Procedure: LEFT HEART CATHETERIZATION WITH CORONARY ANGIOGRAM;  Surgeon: Sanda Klein, MD;  Location[facility] C[facility] LAB;  Service: Cardiovascular;  Laterality: N/A;  ? PERCUTANEOUS CORONARY STENT INTERVENTION (PCI-S) N/A 10/11/2013  ? Procedure: PERCUTANEOUS CORONARY STENT INTERVENTION (PCI-S);  Surgeon: Peter M Martinique, MD;  Location: [facility] C[facility] LAB;  Service: Cardiovascular;  Laterality:  N/A;  ? REPLACEMENT TOTAL HIP W/  RESURFACING IMPLANTS  [id number]  ? Right Foot Surgery  2004  ? x 2  ? Salivary Gland Stone Extraction    ? SPINAL FUSION  2006  ? TOTAL HIP ARTHROPLASTY Right 05/14/2020  ? Procedure: TOTAL HIP ARTHROPLASTY ANTERIOR APPROACH;  Surgeon: Gaynelle Arabian, MD;  Location: [facility] ORS;  Service: Orthopedics;  Laterality: Right;  185mn  ? TOTAL HIP REVISION Left 04/08/2021  ? Procedure: TOTAL HIP REVISION;  Surgeon: AGaynelle Arabian MD;  Location: [facility] ORS;  Service: Orthopedics;  Laterality: Left;  ? ? ?Current Medications: ?Current Meds  ?Medication Sig  ? acetaminophen (TYLENOL) 650 MG CR tablet Take 650 mg by mouth at bedtime as needed (arthritis pain).  ? aspirin 81 MG chewable tablet Chew 1 tablet (81 mg total) by mouth daily.  ? atorvastatin (LIPITOR) 40 MG tablet Take 40 mg by mouth at bedtime.  ? Calcium Carbonate-Vitamin D 600-400 MG-UNIT tablet Take 1 tablet by mouth every morning.  ? clopidogrel (PLAVIX) 75 MG tablet Take 1 tablet (75 mg total) by  mouth daily.  ? Coenzyme Q10 (CO Q 10) 100 MG CAPS Take 300 mg by mouth every morning.  ? furosemide (LASIX) 20 MG tablet Take 1 tablet (20 mg total) by mouth daily.  ? glipiZIDE (GLUCOTROL XL) 5 MG 24 hr tablet Take 10 mg

## 2021-06-09 ENCOUNTER — Encounter: Payer: Self-pay | Admitting: Student

## 2021-06-09 DIAGNOSIS — M25552 Pain in left hip: Secondary | ICD-10-CM | POA: Diagnosis not present

## 2021-06-09 DIAGNOSIS — Z5189 Encounter for other specified aftercare: Secondary | ICD-10-CM | POA: Diagnosis not present

## 2021-06-10 ENCOUNTER — Ambulatory Visit (INDEPENDENT_AMBULATORY_CARE_PROVIDER_SITE_OTHER): Payer: Medicare Other | Admitting: Student

## 2021-06-10 ENCOUNTER — Encounter: Payer: Self-pay | Admitting: Student

## 2021-06-10 VITALS — BP 122/82 | HR 96 | Ht 64.0 in | Wt 137.0 lb

## 2021-06-10 DIAGNOSIS — E785 Hyperlipidemia, unspecified: Secondary | ICD-10-CM | POA: Diagnosis not present

## 2021-06-10 DIAGNOSIS — R0600 Dyspnea, unspecified: Secondary | ICD-10-CM

## 2021-06-10 DIAGNOSIS — E118 Type 2 diabetes mellitus with unspecified complications: Secondary | ICD-10-CM

## 2021-06-10 DIAGNOSIS — N1832 Chronic kidney disease, stage 3b: Secondary | ICD-10-CM

## 2021-06-10 DIAGNOSIS — I252 Old myocardial infarction: Secondary | ICD-10-CM | POA: Diagnosis not present

## 2021-06-10 DIAGNOSIS — I5032 Chronic diastolic (congestive) heart failure: Secondary | ICD-10-CM | POA: Diagnosis not present

## 2021-06-10 DIAGNOSIS — I7121 Aneurysm of the ascending aorta, without rupture: Secondary | ICD-10-CM | POA: Diagnosis not present

## 2021-06-10 DIAGNOSIS — I2510 Atherosclerotic heart disease of native coronary artery without angina pectoris: Secondary | ICD-10-CM | POA: Diagnosis not present

## 2021-06-10 DIAGNOSIS — I10 Essential (primary) hypertension: Secondary | ICD-10-CM | POA: Diagnosis not present

## 2021-06-10 MED ORDER — METOPROLOL SUCCINATE ER 100 MG PO TB24: 100.0000 mg | ORAL_TABLET | Freq: Every morning | ORAL | 3 refills | Status: DC

## 2021-06-10 NOTE — Patient Instructions (Addendum)
Medication Instructions:  ?Increase metoprolol succinate to 100 mg daily ?*If you need a refill on your cardiac medications before your next appointment, please call your pharmacy* ? ?Follow-Up: ?At [facility], you and your health needs are our priority.  As part of our continuing mission to provide you with exceptional heart care, we have created designated Provider Care Teams.  These Care Teams include your primary Cardiologist (physician) and Advanced Practice Providers (APPs -  Physician Assistants and Nurse Practitioners) who all work together to provide you with the care you need, when you need it. ? ?We recommend signing up for the patient portal called "MyChart".  Sign up information is provided on this After Visit Summary.  MyChart is used to connect with patients for Virtual Visits (Telemedicine).  Patients are able to view lab/test results, encounter notes, upcoming appointments, etc.  Non-urgent messages can be sent to your provider as well.   ?To learn more about what you can do with MyChart, go to [email or web address].   ? ?Your next appointment:   ?Keep your August appointment with Dr.Croitoru ? ? ? ? ?

## 2021-06-11 ENCOUNTER — Encounter: Payer: Self-pay | Admitting: Student

## 2021-06-11 ENCOUNTER — Telehealth: Payer: Self-pay

## 2021-06-11 NOTE — Telephone Encounter (Signed)
Pre-op covering team, I saw this patient in clinic yesterday. She is going to need a stress test before her surgery. This is currently scheduled for 06/30/2021. Can you please notify requesting office of this? I will go ahead and remove this from pre-op pool. I will follow-up on the stress test results and complete pre-op evaluation after this. ? ?Dr. Sallyanne Kuster, can you please go ahead and comment on how long Plavix can be held? This is the patient I talked with you about yesterday. She  has known CAD with prior DES to LAD and LCx in 2015.  She was recently admitted with shortness of breath in 04/2021 after recent hip surgery and ruled in for a NSTEMI.  Echo showed LVEF of 50-55% with distal septal hypokinesis.  Cardiac catheterization was discussed but patient and son elected to defer cath at that time and to see how she would do with increased conditioning at home. She was started on DAPT at that time with Aspirin and Plavix. She has continued to have dyspnea so we are getting a cardiac PET CT MPI. ? ?Thank you so much! ?Brittlyn Cloe ?

## 2021-06-11 NOTE — Telephone Encounter (Signed)
Left message with surgery scheduler informing her of Callie's recommendations.  ? ?Advised a call back with any questions or concerns. ?

## 2021-06-11 NOTE — Telephone Encounter (Signed)
? ?  Pre-operative Risk Assessment  ?  ?Patient Name: Karen [name]  ?DOB: [birth year] ?MRN: [id number]  ? ?  ? ?Request for Surgical Clearance   ? ?Procedure:   Reverse Total Shoulder ? ?Date of Surgery:  Clearance TBD                              ?   ?Surgeon:  Dr Esmond Plants ?Surgeon's Group or Practice Name:  [facility] ?Phone number:  [phone] ?Fax number:  [phone] ?  ?Type of Clearance Requested:   ?- Medical  ?- Pharmacy:  Hold Clopidogrel (Plavix)   ?  ?Type of Anesthesia:   None Listed ?  ?Additional requests/questions:   n/a ? ?Signed, ?Ulice Brilliant T   ?06/11/2021, 3:31 PM  ?

## 2021-06-16 DIAGNOSIS — I130 Hypertensive heart and chronic kidney disease with heart failure and stage 1 through stage 4 chronic kidney disease, or unspecified chronic kidney disease: Secondary | ICD-10-CM | POA: Diagnosis not present

## 2021-06-16 DIAGNOSIS — E7800 Pure hypercholesterolemia, unspecified: Secondary | ICD-10-CM | POA: Diagnosis not present

## 2021-06-16 DIAGNOSIS — N183 Chronic kidney disease, stage 3 unspecified: Secondary | ICD-10-CM | POA: Diagnosis not present

## 2021-06-16 DIAGNOSIS — E1122 Type 2 diabetes mellitus with diabetic chronic kidney disease: Secondary | ICD-10-CM | POA: Diagnosis not present

## 2021-06-23 DIAGNOSIS — G4700 Insomnia, unspecified: Secondary | ICD-10-CM | POA: Diagnosis not present

## 2021-06-23 DIAGNOSIS — E1122 Type 2 diabetes mellitus with diabetic chronic kidney disease: Secondary | ICD-10-CM | POA: Diagnosis not present

## 2021-06-23 DIAGNOSIS — D649 Anemia, unspecified: Secondary | ICD-10-CM | POA: Diagnosis not present

## 2021-06-23 DIAGNOSIS — I130 Hypertensive heart and chronic kidney disease with heart failure and stage 1 through stage 4 chronic kidney disease, or unspecified chronic kidney disease: Secondary | ICD-10-CM | POA: Diagnosis not present

## 2021-06-23 DIAGNOSIS — E7800 Pure hypercholesterolemia, unspecified: Secondary | ICD-10-CM | POA: Diagnosis not present

## 2021-06-23 DIAGNOSIS — N184 Chronic kidney disease, stage 4 (severe): Secondary | ICD-10-CM | POA: Diagnosis not present

## 2021-06-25 ENCOUNTER — Ambulatory Visit
Admission: RE | Admit: 2021-06-25 | Discharge: 2021-06-25 | Disposition: A | Discharge status: Home / Self Care | Payer: Medicare Other | Source: Ambulatory Visit | Attending: Physician Assistant | Admitting: Physician Assistant

## 2021-06-25 DIAGNOSIS — M25511 Pain in right shoulder: Secondary | ICD-10-CM | POA: Diagnosis not present

## 2021-06-25 NOTE — Telephone Encounter (Signed)
PET scheduled 5-23

## 2021-06-26 ENCOUNTER — Telehealth (HOSPITAL_COMMUNITY): Payer: Self-pay | Admitting: Emergency Medicine

## 2021-06-26 NOTE — Telephone Encounter (Signed)
Reaching out to patient to offer assistance regarding upcoming cardiac imaging study; pt verbalizes understanding of appt date/time, parking situation and where to check in, pre-test NPO status and medications ordered, and verified current allergies; name and call back number provided for further questions should they arise Zonia Caplin RN Navigator Cardiac Imaging [facility] [phone] office [phone] cell 

## 2021-06-30 ENCOUNTER — Encounter (HOSPITAL_COMMUNITY)
Admission: RE | Admit: 2021-06-30 | Discharge: 2021-06-30 | Disposition: A | Discharge status: Home / Self Care | Payer: Medicare Other | Source: Ambulatory Visit | Attending: Student | Admitting: Student

## 2021-06-30 DIAGNOSIS — R0600 Dyspnea, unspecified: Secondary | ICD-10-CM | POA: Diagnosis not present

## 2021-06-30 DIAGNOSIS — I712 Thoracic aortic aneurysm, without rupture, unspecified: Secondary | ICD-10-CM | POA: Diagnosis not present

## 2021-06-30 DIAGNOSIS — I5042 Chronic combined systolic (congestive) and diastolic (congestive) heart failure: Secondary | ICD-10-CM | POA: Diagnosis not present

## 2021-06-30 LAB — NM PET CT CARDIAC PERFUSION MULTI W/ABSOLUTE BLOODFLOW
MBFR: 1.22
Nuc Rest EF: 68 %
Nuc Stress EF: 66 %
Peak HR: 101 {beats}/min
Rest HR: 85 {beats}/min
Rest MBF: 1.5 ml/g/min
Rest Nuclear Isotope Dose: 16.2 mCi
Rest perfusion cavity size (mL): 75 mL
ST Depression (mm): 0 mm
Stress MBF: 1.83 ml/g/min
Stress Nuclear Isotope Dose: 16.3 mCi
Stress perfusion cavity size (mL): 92 mL
TID: 1.39

## 2021-06-30 MED ORDER — RUBIDIUM RB82 GENERATOR (RUBYFILL): 16030.0000 | PACK | Freq: Once | INTRAVENOUS | Status: DC

## 2021-06-30 MED ORDER — RUBIDIUM RB82 GENERATOR (RUBYFILL)
16.3000 | PACK | Freq: Once | INTRAVENOUS | Status: AC
  Administered 2021-06-30: 16.3 via INTRAVENOUS

## 2021-06-30 MED ORDER — RUBIDIUM RB82 GENERATOR (RUBYFILL)
16.1600 | PACK | Freq: Once | INTRAVENOUS | Status: DC
Start: 2021-06-30 — End: 2021-06-30

## 2021-06-30 MED ORDER — REGADENOSON 0.4 MG/5ML IV SOLN
INTRAVENOUS | Status: AC
  Administered 2021-06-30: 0.4 mg via INTRAVENOUS
  Filled 2021-06-30: qty 5

## 2021-06-30 MED ORDER — RUBIDIUM RB82 GENERATOR (RUBYFILL)
16.1600 | PACK | Freq: Once | INTRAVENOUS | Status: AC
  Administered 2021-06-30: 16.16 via INTRAVENOUS

## 2021-07-03 ENCOUNTER — Encounter: Payer: Self-pay | Admitting: Cardiovascular Disease

## 2021-07-03 ENCOUNTER — Ambulatory Visit (INDEPENDENT_AMBULATORY_CARE_PROVIDER_SITE_OTHER): Payer: Medicare Other | Admitting: Cardiovascular Disease

## 2021-07-03 VITALS — BP 142/76 | HR 79 | Ht 64.0 in | Wt 135.0 lb

## 2021-07-03 DIAGNOSIS — M19011 Primary osteoarthritis, right shoulder: Secondary | ICD-10-CM | POA: Diagnosis not present

## 2021-07-03 DIAGNOSIS — E785 Hyperlipidemia, unspecified: Secondary | ICD-10-CM | POA: Diagnosis not present

## 2021-07-03 DIAGNOSIS — I700 Atherosclerosis of aorta: Secondary | ICD-10-CM

## 2021-07-03 DIAGNOSIS — I5032 Chronic diastolic (congestive) heart failure: Secondary | ICD-10-CM | POA: Diagnosis not present

## 2021-07-03 DIAGNOSIS — E1159 Type 2 diabetes mellitus with other circulatory complications: Secondary | ICD-10-CM

## 2021-07-03 DIAGNOSIS — I2510 Atherosclerotic heart disease of native coronary artery without angina pectoris: Secondary | ICD-10-CM

## 2021-07-03 DIAGNOSIS — I10 Essential (primary) hypertension: Secondary | ICD-10-CM

## 2021-07-03 DIAGNOSIS — I7121 Aneurysm of the ascending aorta, without rupture: Secondary | ICD-10-CM

## 2021-07-03 NOTE — Patient Instructions (Addendum)
Medication Instructions:   No changes *If you need a refill on your cardiac medications before your next appointment, please call your pharmacy*   Lab Work:  Not needed   Testing/Procedures:  Not needed  Follow-Up: At [facility], you and your health needs are our priority.  As part of our continuing mission to provide you with exceptional heart care, we have created designated Provider Care Teams.  These Care Teams include your primary Cardiologist (physician) and Advanced Practice Providers (APPs -  Physician Assistants and Nurse Practitioners) who all work together to provide you with the care you need, when you need it.     Your next appointment:   Keep appointment for Aug 2023  The format for your next appointment:   In Person  Provider:   Sanda Klein, MD

## 2021-07-03 NOTE — Progress Notes (Unsigned)
Patient ID: Karen [name], female   DOB: [birth year], 88 y.o.   MRN: [id number]    Cardiology Office Note    Date:  07/04/2021   ID:  Karen [name], DOB 1928/03/15, MRN [id number]  PCP:  Karen [name]  Cardiologist:   Karen Klein, [name]   Chief Complaint  Patient presents with   Congestive Heart Failure   Coronary Artery Disease    F/U PET scan    History of Present Illness:  Karen [name] is a 88 y.o. female with coronary artery disease (2015 cath with 30-40% distal left main stenosis, 40% proximal AV stenosis, 90% mid and distal LAD stenosis, 70-90% mid-distal left circumflex stenosis, 40-50% mid RCA stenosis; status post PCI-DES to LAD and circumflex), history of chronic diastolic heart failure (improved after treatment of coronary disease), hypertension and an ascending aortic aneurysm last measured  (June 2019) at 4.7 cm in maximum diameter.  Challenging few months.  She was hospitalized in early March for failure of her left hip prosthesis leading to dislocation and a fall.  She underwent total hip revision on 04/09/2021 but has developed a hematoma that has recurred after being drained 3 times.  This is painful and prevents her from laying on her left side.  She has severe arthritis in her right shoulder which makes it hard for her to lie on her right side.  She has had such severe pain in the right shoulder that she is considering right shoulder surgery.  She tells me that she has decided to put this off since they are planning to move to a new home and she thinks it is "just too much".  She had a repeat hospitalization in late March with dyspnea and orthopnea.  VQ scan was negative for pulmonary embolism.  High-sensitivity troponin was mildly elevated, peaking at 192.  Echo showed LVEF decreased to 50-55% with distal septal hypokinesis and she improved with IV diuretics.  Cardiac catheterization was considered, but the decision was made to defer this because of her renal  dysfunction.  She was placed on dual antiplatelet therapy with aspirin and clopidogrel.    We have subsequently obtained a PET scan performed on 06/30/2021.  This does not show any regional perfusion deficits, but does show low myocardial blood flow reserve and overall diminished maximum myocardial blood flow with stress (only 1.8 mL/g/minute).  This raises a concern for multivessel coronary artery disease or left main coronary disease.  It should be noted that EF is better, 66% at rest, 68% at stress.  Her breathing has definitely improved although it is not back to her previous baseline.  She is much less active due to her orthopedic problems.  She denies exertional chest pain (she has never actually had angina, presentation was with dyspnea).  NYHA functional class III dyspnea on exertion.  Limited more by shoulder and hip problems than by breathing   She did very poorly with attempted transition from metoprolol to carvedilol, which caused diarrhea tremulousness and what sounds like rebound hypertension and rebound arrhythmia.   Glycemic control is excellent with a hemoglobin A1c of 6.5% and her most recent LDL cholesterol was acceptable at 74.  Renal parameters are slightly abnormal with a BUN of 42 and creatinine of 1.33.  She has stopped taking meloxicam and other NSAIDs.  Potassium was normal at 4.3.  Her TSH was mildly elevated at 6.7 but she has no symptoms for thyroidism.  On October 11 2013 she had 2.25 x 20  mm Promus to 90% L,AD- mid to distal and 3.5 x 12 mm Promus to 80% LCx-mid lesions. She had presented with profound fatigue and exertional dyspnea, but without angina. Symptoms did not resolve despite aggressive antiHTNive and antianginal pharmacological therapy. LVEF was 50%. Symptoms improved rapidly after placement of the stents. She has treated HTN and hyperlipidemia and mild DM.   Past Medical History:  Diagnosis Date   Arthritis    Ascending aortic aneurysm (HCC)    Chronic  diastolic CHF (congestive heart failure) (HCC)    Constipation    Coronary artery disease 04/2013   Three-vessel disease, initial medical therapy then stenting of the LAD and CFX 10/2013   Diabetes mellitus    TYPE 2   Fibromyalgia    H/O: gout    Heart murmur    Hx: UTI (urinary tract infection)    Hypertension     Past Surgical History:  Procedure Laterality Date   [place]  04/2013   Left main 30-40%, prox LAD 40%, mid/distal LAD 90%, CFX 70%, OM 3 90%, RCA 40-50%, PDA 20-80%, EF 50%    CHOLECYSTECTOMY  1951   Closed reduction of Left Total Hip  2008   x 3   CORONARY STENT PLACEMENT  10/11/2013   2.25 x 20 mm Promus DES to mid/distal LAD & 3.5 x 12 mm Promus DES Mid CX         Karen [name]   HERNIA REPAIR  1963   JOINT REPLACEMENT  left hip-2007, left knee-2000, right knee-1997   Left Hip, Bilateral Knee replacements   KNEE SURGERY  1997/2000   LEFT HEART CATHETERIZATION WITH CORONARY ANGIOGRAM N/A 04/25/2013   Procedure: LEFT HEART CATHETERIZATION WITH CORONARY ANGIOGRAM;  Surgeon: Karen Klein, [name];  Location: [facility] CATH LAB;  Service: Cardiovascular;  Laterality: N/A;   PERCUTANEOUS CORONARY STENT INTERVENTION (PCI-S) N/A 10/11/2013   Procedure: PERCUTANEOUS CORONARY STENT INTERVENTION (PCI-S);  Surgeon: Karen M Martinique, [name];  Location: [facility] CATH LAB;  Service: Cardiovascular;  Laterality: N/A;   REPLACEMENT TOTAL HIP W/  RESURFACING IMPLANTS  [id number],[id number]   Right Foot Surgery  2004   x 2   Salivary Gland Stone Extraction     SPINAL FUSION  2006   TOTAL HIP ARTHROPLASTY Right 05/14/2020   Procedure: TOTAL HIP ARTHROPLASTY ANTERIOR APPROACH;  Surgeon: Karen Arabian, [name];  Location: [facility] ORS;  Service: Orthopedics;  Laterality: Right;  117mn   TOTAL HIP REVISION Left 04/08/2021   Procedure: TOTAL HIP REVISION;  Surgeon: Karen Arabian [name];  Location: [facility] ORS;  Service: Orthopedics;  Laterality: Left;    Current  Medications: Outpatient Medications Prior to Visit  Medication Sig Dispense Refill   acetaminophen (TYLENOL) 650 MG CR tablet Take 650 mg by mouth at bedtime as needed (arthritis pain).     aspirin 81 MG chewable tablet Chew 1 tablet (81 mg total) by mouth daily.     atorvastatin (LIPITOR) 40 MG tablet Take 40 mg by mouth at bedtime.     Calcium Carbonate-Vitamin D 600-400 MG-UNIT tablet Take 1 tablet by mouth every [name].     clopidogrel (PLAVIX) 75 MG tablet Take 1 tablet (75 mg total) by mouth daily. 30 tablet 3   Coenzyme Q10 (CO Q 10) 100 MG CAPS Take 300 mg by mouth every [name].     docusate sodium (COLACE) 100 MG capsule Take 100 mg by mouth as needed.     furosemide (LASIX) 20  MG tablet Take 1 tablet (20 mg total) by mouth daily. 30 tablet 5   glipiZIDE (GLUCOTROL) 10 MG tablet Take 10 mg by mouth 2 (two) times daily.     Magnesium 250 MG TABS Take 250 mg by mouth every [name].     metFORMIN (GLUCOPHAGE) 1000 MG tablet Take 1 tablet (1,000 mg total) by mouth 2 (two) times daily with a meal. Hold for 2 days, restart on 10/14/2013 (Patient taking differently: Take 1,000 mg by mouth 2 (two) times daily after a meal.) 60 tablet 0   methocarbamol (ROBAXIN) 500 MG tablet methocarbamol 500 mg tablet  TAKE 1 TABLET BY MOUTH FOUR TIMES DAILY AS DIRECTED     metoprolol succinate (TOPROL-XL) 100 MG 24 hr tablet Take 1 tablet (100 mg total) by mouth every [name]. Take with or immediately following a meal. 90 tablet 3   mirtazapine (REMERON) 15 MG tablet Take 15 mg by mouth at bedtime.     Multiple Vitamin (MULTIVITAMIN WITH MINERALS) TABS tablet Take 1 tablet by mouth every [name].     Polyethyl Glycol-Propyl Glycol (SYSTANE ULTRA) 0.4-0.3 % SOLN Place 1 drop into both eyes daily as needed (dry eyes).     potassium chloride SA (K-DUR,KLOR-CON) 20 MEQ tablet Take 1 tablet (20 mEq total) by mouth 2 (two) times daily. (Patient taking differently: Take 20 mEq by mouth 2 (two) times daily after a  meal.) 180 tablet 3   QUEtiapine (SEROQUEL) 200 MG tablet Take 200 mg by mouth at bedtime.     valsartan (DIOVAN) 320 MG tablet Take 1 tablet (320 mg total) by mouth daily. (Patient taking differently: Take 320 mg by mouth every [name].) 90 tablet 3   glipiZIDE (GLUCOTROL XL) 5 MG 24 hr tablet Take 10 mg by mouth every [name]. '10mg'$  in the [name] '10mg'$  at night (Patient not taking: Reported on 07/03/2021)     nitroGLYCERIN (NITROSTAT) 0.4 MG SL tablet Place 1 tablet (0.4 mg total) under the tongue every 5 (five) minutes as needed for chest pain (or SOB). (Patient not taking: Reported on 07/03/2021) 15 tablet 12   No facility-administered medications prior to visit.     Allergies:   Lisinopril; Pneumococcal vaccines; Antihistamines, diphenhydramine-type; Carvedilol; Codeine; Morphine and related; and Ultram [tramadol hcl]   Social History   Socioeconomic History   Marital status: Widowed    Spouse name: Not on file   Number of children: Not on file   Years of education: Not on file   Highest education level: Not on file  Occupational History   Not on file  Tobacco Use   Smoking status: Never   Smokeless tobacco: Never  Vaping Use   Vaping Use: Never used  Substance and Sexual Activity   Alcohol use: No   Drug use: No   Sexual activity: Never  Other Topics Concern   Not on file  Social History Narrative   Not on file   Social Determinants of Health   Financial Resource Strain: Not on file  Food Insecurity: Not on file  Transportation Needs: Not on file  Physical Activity: Not on file  Stress: Not on file  Social Connections: Not on file     Family History:  The patient's family history includes Cancer in her brother and mother; Heart attack in her brother and father; Stroke in her father.   ROS:   Please see the history of present illness.    All other systems are reviewed and are negative.   PHYSICAL EXAM:  VS:  BP (!) 142/76 (BP Location: Left Arm, Patient  Position: Sitting, Cuff Size: Normal)   Pulse 79   Ht '5\' 4"'$  (1.626 m)   Wt 135 lb (61.2 kg)   LMP 05/10/1998   SpO2 98%   BMI 23.17 kg/m       General: Alert, oriented x3, no distress, appears a little more frail than I remember her Head: no evidence of trauma, PERRL, EOMI, no exophtalmos or lid lag, no myxedema, no xanthelasma; normal ears, nose and oropharynx Neck: normal jugular venous pulsations and no hepatojugular reflux; brisk carotid pulses without delay and no carotid bruits Chest: clear to auscultation, no signs of consolidation by percussion or palpation, normal fremitus, symmetrical and full respiratory excursions Cardiovascular: normal position and quality of the apical impulse, regular rhythm, normal first and second heart sounds, no murmurs, rubs or gallops Abdomen: no tenderness or distention, no masses by palpation, no abnormal pulsatility or arterial bruits, normal bowel sounds, no hepatosplenomegaly Extremities: no clubbing, cyanosis or edema; 2+ radial, ulnar and brachial pulses bilaterally; 2+ right femoral, posterior tibial and dorsalis pedis pulses; 2+ left femoral, posterior tibial and dorsalis pedis pulses; no subclavian or femoral bruits.  Limited mobility left shoulder Neurological: grossly nonfocal Psych: Normal mood and affect     Wt Readings from Last 3 Encounters:  07/03/21 135 lb (61.2 kg)  06/10/21 137 lb (62.1 kg)  05/15/21 142 lb 12.8 oz (64.8 kg)      Studies/Labs Reviewed:   EKG:  EKG is not ordered today.  Most recent tracing from 04/25/2021 is personally reviewed and shows normal sinus rhythm, nonspecific minor IVCD with QRS duration 150 ms, no ischemic repolarization abnormalities  Echocardiogram 04/27/2021   1. Distal septal hypokinesis . Left ventricular ejection fraction, by  estimation, is 50 to 55%. The left ventricle has low normal function. The  left ventricle demonstrates regional wall motion abnormalities (see  scoring  diagram/findings for  description). There is mild asymmetric left ventricular hypertrophy of the  basal and septal segments. Left ventricular diastolic parameters are  indeterminate.   2. Right ventricular systolic function is normal. The right ventricular  size is normal.   3. Left atrial size was mildly dilated.   4. The mitral valve is degenerative. Trivial mitral valve regurgitation.  No evidence of mitral stenosis. Moderate mitral annular calcification.   5. The aortic valve is tricuspid. There is moderate calcification of the  aortic valve. There is moderate thickening of the aortic valve. Aortic  valve regurgitation is trivial. Aortic valve sclerosis/calcification is  present, without any evidence of  aortic stenosis.   6. The inferior vena cava is normal in size with greater than 50%  respiratory variability, suggesting right atrial pressure of 3 mmHg.  Recent Labs: 06/12/2019 Hemoglobin 12.9, creatinine 1.4 (GFR 37), potassium 4.6, normal liver function tests, Hemoglobin A1c 8.8%, Total cholesterol 137, triglycerides 116, HDL 48, LDL 68  07/01/2020 Hemoglobin 11.1, TSH 6.7, BUN 42, creatinine 1.33, potassium 4.3 Hemoglobin A1c 7.3% Total cholesterol 153, HDL 57, LDL 74, triglycerides 111     Latest Ref Rng & Units 05/15/2021    3:52 PM 04/26/2021    3:13 AM 04/25/2021   10:57 PM  BMP  Glucose 70 - 99 mg/dL 244   318   357    BUN 10 - 36 mg/dL 31   36   39    Creatinine 0.57 - 1.00 mg/dL 1.68   1.60   1.75    BUN/Creat Ratio 12 -  28 18      Sodium 134 - 144 mmol/L 137   132   133    Potassium 3.5 - 5.2 mmol/L 5.1   4.9   4.7    Chloride 96 - 106 mmol/L 102   104   101    CO2 20 - 29 mmol/L '19   17   20    '$ Calcium 8.7 - 10.3 mg/dL 9.6   8.5   9.1      06/16/2021 Potassium 4.8, creatinine 1.65, glucose 280, normal liver function tests Hemoglobin 10.3, MCV 107, hemoglobin A1c 8.1%  Lipid Panel  No results found for: CHOL, TRIG, HDL, CHOLHDL, VLDL, LDLCALC, LDLDIRECT,  LABVLDL  06/16/2021 Cholesterol 146, triglycerides 166, HDL 47, LDL 71  ASSESSMENT:    1. Chronic diastolic CHF (congestive heart failure) ([place])   2. Coronary artery disease involving native coronary artery of native heart without angina pectoris   3. Aneurysm of ascending aorta without rupture ([place])   4. Atherosclerosis of aorta ([place])   5. Essential hypertension   6. Hyperlipidemia LDL goal <70   7. Type 2 diabetes mellitus with other circulatory complication, without long-term current use of insulin ([place])   8. Arthrosis of shoulder, right        PLAN:  In order of problems listed above:  CHF: Currently functional limitations are mostly due to orthopedic issues, but she does have some exertional dyspnea, which was the way she presented with multivessel CAD in 2015. CAD: Even when she had severe LAD and left circumflex coronary stenosis she never had angina, but had shortness of breath.  The current PET findings are concerning for recurrent multivessel CAD, although no distinct single area of abnormal perfusion is seen.  We discussed cardiac catheterization and the potential for complications.  She had serious groin bleeding with her previous catheterization (unable to access the coronaries via radial approach) and has advanced chronic kidney disease with an estimated GFR around 25-30.  If we [name] perform angiography, it is possible that we will find anatomy that is not amenable to percutaneous revascularization.  She is not a candidate for surgical bypass.  I think it would be wise to perform at least a diagnostic coronary angiogram before she decides to proceed with other surgery such as shoulder surgery.  She understands that if we [name] need to perform angioplasty-stents for new CAD, this can delay her shoulder surgery for a substantial period of time. TAAA: Asymptomatic.  This has been asymptomatic and stable in size.  Most recently estimated at 4.8 cm on 08/05/2020.  She has substantial  aortic atherosclerosis throughout the entire thoracic aorta.  The distal descending thoracic aorta is also aneurysmal with a maximum diameter 4.3 cm HTN: Adequate control.  Continue current medications.  Prefer use of beta-blockers due to CAD and aortic aneurysm, but note that she was severely intolerant of carvedilol. HLP: All lipid parameters are very close to target range. DM: Borderline adequate glycemic control. Shoulder arthritis: This is a low risk procedure, but I am concerned because of her age, the complications she has had with the recent hip procedure and the possibility of severe underlying CAD.  At this point she has decided that she is going to defer the surgery and at least until after she moves into her new home.  The shoulder pain appears to be better.  We will defer the decision on performing cardiac catheterization until her next appointment.    Medication Adjustments/Labs and Tests Ordered: Current  medicines are reviewed at length with the patient today.  Concerns regarding medicines are outlined above.  Medication changes, Labs and Tests ordered today are listed in the Patient Instructions below. Patient Instructions  Medication Instructions:   No changes *If you need a refill on your cardiac medications before your next appointment, please call your pharmacy*   Lab Work:  Not needed   Testing/Procedures:  Not needed  Follow-Up: At [facility], you and your health needs are our priority.  As part of our continuing mission to provide you with exceptional heart care, we have created designated Provider Care Teams.  These Care Teams include your primary Cardiologist (physician) and Advanced Practice Providers (APPs -  Physician Assistants and Nurse Practitioners) who all work together to provide you with the care you need, when you need it.     Your next appointment:   Keep appointment for Aug 2023  The format for your next appointment:   In  Person  Provider:   Sanda Klein, [name]      Signed, Karen Klein, [name]  07/04/2021 3:21 PM    [facility] [address], [place], [place]  [address] Phone: [phone]; Fax: [phone]

## 2021-08-21 ENCOUNTER — Telehealth: Payer: Self-pay

## 2021-08-21 NOTE — Telephone Encounter (Signed)
   Pre-operative Risk Assessment    Patient Name: Karen [name]  DOB: 06/22/1928 MRN: [id number]      Request for Surgical Clearance    Procedure:   Reverse Total Shoulder  Date of Surgery:  Clearance TBD                                 Surgeon:  Dr. Esmond Plants Surgeon's Group or Practice Name:  [facility] Phone number:  [phone] Fax number:  [phone]   Type of Clearance Requested:   - Medical  - Pharmacy:  Hold Aspirin and Clopidogrel (Plavix)     Type of Anesthesia:  Not Indicated   Additional requests/questions:   None  Signed, Nakina Spatz   08/21/2021, 12:32 PM

## 2021-08-22 NOTE — Telephone Encounter (Signed)
Unfortunately, not much has changed since that statement. If she wants shoulder surgery, would do a heart cath (diagnostic only due to CKD) first.

## 2021-08-24 NOTE — Telephone Encounter (Signed)
Patient will need at least diagnostic cath prior to surgical clearance. Has follow up with Dr. Sallyanne Kuster next week. Can move appointment with APP if there is urgency.   I will route this recommendation to the requesting party via Epic fax function and remove from pre-op pool.  Please call with questions.  [place], [place] 08/24/2021, 1:08 PM

## 2021-10-06 ENCOUNTER — Ambulatory Visit: Payer: Medicare Other | Attending: Cardiovascular Disease | Admitting: Cardiovascular Disease

## 2021-10-06 ENCOUNTER — Encounter: Payer: Self-pay | Admitting: Cardiovascular Disease

## 2021-10-06 VITALS — BP 192/91 | HR 78 | Ht 64.0 in | Wt 123.4 lb

## 2021-10-06 DIAGNOSIS — E785 Hyperlipidemia, unspecified: Secondary | ICD-10-CM | POA: Diagnosis not present

## 2021-10-06 DIAGNOSIS — I700 Atherosclerosis of aorta: Secondary | ICD-10-CM | POA: Insufficient documentation

## 2021-10-06 DIAGNOSIS — I10 Essential (primary) hypertension: Secondary | ICD-10-CM | POA: Insufficient documentation

## 2021-10-06 DIAGNOSIS — I5032 Chronic diastolic (congestive) heart failure: Secondary | ICD-10-CM | POA: Diagnosis not present

## 2021-10-06 DIAGNOSIS — E1159 Type 2 diabetes mellitus with other circulatory complications: Secondary | ICD-10-CM | POA: Diagnosis not present

## 2021-10-06 DIAGNOSIS — I2510 Atherosclerotic heart disease of native coronary artery without angina pectoris: Secondary | ICD-10-CM | POA: Diagnosis not present

## 2021-10-06 DIAGNOSIS — N1832 Chronic kidney disease, stage 3b: Secondary | ICD-10-CM | POA: Diagnosis not present

## 2021-10-06 DIAGNOSIS — I7121 Aneurysm of the ascending aorta, without rupture: Secondary | ICD-10-CM | POA: Insufficient documentation

## 2021-10-06 NOTE — Progress Notes (Signed)
Patient ID: Karen [name], female   DOB: 1928-12-12, 88 y.o.   MRN: [id number]    Cardiology Office Note    Date:  10/09/2021   ID:  Karen [name], DOB 01-Aug-1928, MRN [id number]  PCP:  [name], DO  Cardiologist:   Sanda Klein, MD   Chief Complaint  Patient presents with   Coronary Artery Disease    History of Present Illness:  Karen [name] is a 88 y.o. female with coronary artery disease (2015 cath with 30-40% distal left main stenosis, 40% proximal AV stenosis, 90% mid and distal LAD stenosis, 70-90% mid-distal left circumflex stenosis, 40-50% mid RCA stenosis; status post PCI-DES to LAD and circumflex), history of chronic diastolic heart failure (improved after treatment of coronary disease), hypertension and an ascending aortic aneurysm last measured  (June 2019) at 4.7 cm in maximum diameter.  She is feeling much better than at her last appointment.  She does not believe she is experienced any dyspnea in the last 4 months.  Her right shoulder feels much better and she is now able to lift it beyond the horizontal without pain.  Her left hip seroma has finally stopped reaccumulating.  Karen [name] and her son are moving to a new house and they are busy with all the changes.  On October 11 2013 she had 2.25 x 20 mm Promus to 90% L,AD- mid to distal and 3.5 x 12 mm Promus to 80% LCx-mid lesions. She had presented with profound fatigue and exertional dyspnea, but without angina. Symptoms did not resolve despite aggressive antiHTNive and antianginal pharmacological therapy. LVEF was 50%. Symptoms improved rapidly after placement of the stents. She has treated HTN and hyperlipidemia and mild DM.   She had a repeat hospitalization in late March 2023 with dyspnea and orthopnea.  VQ scan was negative for pulmonary embolism.  High-sensitivity troponin was mildly elevated, peaking at 192.  Echo showed LVEF decreased to 50-55% with distal septal hypokinesis and she improved with IV  diuretics.  Cardiac catheterization was considered, but the decision was made to defer this because of her renal dysfunction.  She was placed on dual antiplatelet therapy with aspirin and clopidogrel.    We have subsequently obtained a PET scan performed on 06/30/2021.  This does not show any regional perfusion deficits, but does show low myocardial blood flow reserve and overall diminished maximum myocardial blood flow with stress (only 1.8 mL/g/minute).  This raises a concern for multivessel coronary artery disease or left main coronary disease.  It should be noted that EF is better, 66% at rest, 68% at stress.  She did very poorly with attempted transition from metoprolol to carvedilol, which caused diarrhea tremulousness and what sounds like rebound hypertension and rebound arrhythmia.   Glycemic control has deteriorated with a recent hemoglobin A1c of 8.1%, but she believes this is better over the last several weeks.  All her lipid parameters remain in target range.  The most recent creatinine was actually little worse at 1.65.  Potassium level was normal.  She is no longer taking any NSAIDs.  She has lost weight.     Past Medical History:  Diagnosis Date   Arthritis    Ascending aortic aneurysm (HCC)    Chronic diastolic CHF (congestive heart failure) (HCC)    Constipation    Coronary artery disease 04/2013   Three-vessel disease, initial medical therapy then stenting of the LAD and CFX 10/2013   Diabetes mellitus    TYPE 2   Fibromyalgia  H/O: gout    Heart murmur    Hx: UTI (urinary tract infection)    Hypertension     Past Surgical History:  Procedure Laterality Date   [place]val CATHETERIZATION  04/2013   Left main 30-40%, prox LAD 40%, mid/distal LAD 90%, CFX 70%, OM 3 90%, RCA 40-50%, PDA 20-80%, EF 50%    CHOLECYSTECTOMY  1951   Closed reduction of Left Total Hip  2008   x 3   CORONARY STENT PLACEMENT  10/11/2013   2.25 x 20  mm Promus DES to mid/distal LAD & 3.5 x 12 mm Promus DES Mid CX         DR Martinique   HERNIA REPAIR  1963   JOINT REPLACEMENT  left hip-2007, left knee-2000, right knee-1997   Left Hip, Bilateral Knee replacements   KNEE SURGERY  1997/2000   LEFT HEART CATHETERIZATION WITH CORONARY ANGIOGRAM N/A 04/25/2013   Procedure: LEFT HEART CATHETERIZATION WITH CORONARY ANGIOGRAM;  Surgeon: Sanda Klein, MD;  Location: [facility] CATH LAB;  Service: Cardiovascular;  Laterality: N/A;   PERCUTANEOUS CORONARY STENT INTERVENTION (PCI-S) N/A 10/11/2013   Procedure: PERCUTANEOUS CORONARY STENT INTERVENTION (PCI-S);  Surgeon: Peter M Martinique, MD;  Location: [facility] CATH LAB;  Service: Cardiovascular;  Laterality: N/A;   REPLACEMENT TOTAL HIP W/  RESURFACING IMPLANTS  1027,2536   Right Foot Surgery  2004   x 2   Salivary Gland Stone Extraction     SPINAL FUSION  2006   TOTAL HIP ARTHROPLASTY Right 05/14/2020   Procedure: TOTAL HIP ARTHROPLASTY ANTERIOR APPROACH;  Surgeon: Gaynelle Arabian, MD;  Location: [facility] OR[facility];  Service: Orthopedics;  Laterality: Right;  150mn   TOTAL HIP REVISION Left 04/08/2021   Procedure: TOTAL HIP REVISION;  Surgeon: AGaynelle Arabian MD;  Location: [facility] OR[facility];  Service: Orthopedics;  Laterality: Left;    Current Medications: Outpatient Medications Prior to Visit  Medication Sig Dispense Refill   acetaminophen (TYLENOL) 650 MG CR tablet Take 650 mg by mouth at bedtime as needed (arthritis pain).     aspirin 81 MG chewable tablet Chew 1 tablet (81 mg total) by mouth daily.     atorvastatin (LIPITOR) 40 MG tablet Take 40 mg by mouth at bedtime.     Calcium Carbonate-Vitamin D 600-400 MG-UNIT tablet Take 1 tablet by mouth every morning.     clopidogrel (PLAVIX) 75 MG tablet Take 1 tablet (75 mg total) by mouth daily. 30 tablet 3   Coenzyme Q10 (CO Q 10) 100 MG CAPS Take 300 mg by mouth every morning.     furosemide (LASIX) 20 MG tablet Take 1 tablet (20 mg total) by mouth daily. 30 tablet 5   glipiZIDE  (GLUCOTROL) 10 MG tablet Take 10 mg by mouth 2 (two) times daily.     Magnesium 250 MG TABS Take 250 mg by mouth every morning.     metFORMIN (GLUCOPHAGE) 1000 MG tablet Take 1 tablet (1,000 mg total) by mouth 2 (two) times daily with a meal. Hold for 2 days, restart on 10/14/2013 (Patient taking differently: Take 1,000 mg by mouth 2 (two) times daily with a meal.) 60 tablet 0   methocarbamol (ROBAXIN) 500 MG tablet methocarbamol 500 mg tablet  TAKE 1 TABLET BY MOUTH FOUR TIMES DAILY AS DIRECTED     metoprolol succinate (TOPROL-XL) 100 MG 24 hr tablet Take 1 tablet (100 mg total) by mouth every morning. Take with or immediately following a meal. 90 tablet  3   mirtazapine (REMERON) 15 MG tablet Take 15 mg by mouth at bedtime.     Multiple Vitamin (MULTIVITAMIN WITH MINERALS) TABS tablet Take 1 tablet by mouth every morning.     potassium chloride SA (K-DUR,KLOR-CON) 20 MEQ tablet Take 1 tablet (20 mEq total) by mouth 2 (two) times daily. (Patient taking differently: Take 20 mEq by mouth 2 (two) times daily after a meal.) 180 tablet 3   QUEtiapine (SEROQUEL) 200 MG tablet Take 200 mg by mouth at bedtime.     valsartan (DIOVAN) 320 MG tablet Take 1 tablet (320 mg total) by mouth daily. (Patient taking differently: Take 320 mg by mouth every morning.) 90 tablet 3   docusate sodium (COLACE) 100 MG capsule Take 100 mg by mouth as needed. (Patient not taking: Reported on 10/06/2021)     nitroGLYCERIN (NITROSTAT) 0.4 MG SL tablet Place 1 tablet (0.4 mg total) under the tongue every 5 (five) minutes as needed for chest pain (or SOB). (Patient not taking: Reported on 10/06/2021) 15 tablet 12   Polyethyl Glycol-Propyl Glycol (SYSTANE ULTRA) 0.4-0.3 % SOLN Place 1 drop into both eyes daily as needed (dry eyes). (Patient not taking: Reported on 10/06/2021)     No facility-administered medications prior to visit.     Allergies:   Lisinopril; Pneumococcal vaccines; Antihistamines, diphenhydramine-type; Carvedilol;  Codeine; Morphine and related; and Ultram [tramadol hcl]   Social History   Socioeconomic History   Marital status: Widowed    Spouse name: Not on file   Number of children: Not on file   Years of education: Not on file   Highest education level: Not on file  Occupational History   Not on file  Tobacco Use   Smoking status: Never   Smokeless tobacco: Never  Vaping Use   Vaping Use: Never used  Substance and Sexual Activity   Alcohol use: No   Drug use: No   Sexual activity: Never  Other Topics Concern   Not on file  Social History Narrative   Not on file   Social Determinants of Health   Financial Resource Strain: Not on file  Food Insecurity: Not on file  Transportation Needs: Not on file  Physical Activity: Not on file  Stress: Not on file  Social Connections: Not on file     Family History:  The patient's family history includes Cancer in her brother and mother; Heart attack in her brother and father; Stroke in her father.   ROS:   Please see the history of present illness.    All other systems are reviewed and are negative.   PHYSICAL EXAM:   VS:  BP (!) 192/91 (BP Location: Left Arm, Patient Position: Sitting, Cuff Size: Small)   Pulse 78   Ht '5\' 4"'$  (1.626 m)   Wt 123 lb 6.4 oz (56 kg)   LMP 05/10/1998   SpO2 97%   BMI 21.18 kg/m     Repeat BP 180/92   General: Alert, oriented x3, no distress, appears well Head: no evidence of trauma, PERRL, EOMI, no exophtalmos or lid lag, no myxedema, no xanthelasma; normal ears, nose and oropharynx Neck: normal jugular venous pulsations and no hepatojugular reflux; brisk carotid pulses without delay and no carotid bruits Chest: clear to auscultation, no signs of consolidation by percussion or palpation, normal fremitus, symmetrical and full respiratory excursions Cardiovascular: normal position and quality of the apical impulse, regular rhythm, normal first and second heart sounds, no murmurs, rubs or  gallops Abdomen: no  tenderness or distention, no masses by palpation, no abnormal pulsatility or arterial bruits, normal bowel sounds, no hepatosplenomegaly Extremities: no clubbing, cyanosis or edema; 2+ radial, ulnar and brachial pulses bilaterally; 2+ right femoral, posterior tibial and dorsalis pedis pulses; 2+ left femoral, posterior tibial and dorsalis pedis pulses; no subclavian or femoral bruits Neurological: grossly nonfocal Psych: Normal mood and affect      Wt Readings from Last 3 Encounters:  10/06/21 123 lb 6.4 oz (56 kg)  07/03/21 135 lb (61.2 kg)  06/10/21 137 lb (62.1 kg)      Studies/Labs Reviewed:   EKG:  EKG is not ordered today.  Most recent tracing from 04/25/2021 is personally reviewed and shows normal sinus rhythm, nonspecific minor IVCD with QRS duration 150 ms, no ischemic repolarization abnormalities  PET scan 06/30/2021   LV perfusion is normal. There is no evidence of ischemia. There is no evidence of infarction. TID was present (1.39) and confirmed visually. EF dropped with stress (68%->66%). Myocardial blood flow reserve abnormal (1.22) but this was influenced by high rest flow due to elevated blood pressure (1.50 ml/min/g). Regardless, stress myocardial blood flow abnormal (1.8 ml/min/g). Overall, findings are concerning for left main or 3-vessel CAD due to drop in EF and TID despite normal perfusion imaging. MBF abnormal as well. Would consider invasive angiography for clarification.   Rest left ventricular function is normal. Rest EF: 68 %. Stress left ventricular function is normal. Stress EF: 66 %. End diastolic cavity size is normal.   Myocardial blood flow was computed to be 1.39m/g/min at rest and 1.880mg/min at stress. Global myocardial blood flow reserve was 1.22 and was abnormal.   Coronary calcium assessment not performed due to prior revascularization.   The study is normal. The study is high risk.   Electronically signed by WeEleonore ChiquitoMD      Echocardiogram 04/27/2021   1. Distal septal hypokinesis . Left ventricular ejection fraction, by  estimation, is 50 to 55%. The left ventricle has low normal function. The left ventricle demonstrates regional wall motion abnormalities (see scoring diagram/findings for  description). There is mild asymmetric left ventricular hypertrophy of the basal and septal segments. Left ventricular diastolic parameters are indeterminate.   2. Right ventricular systolic function is normal. The right ventricular size is normal.   3. Left atrial size was mildly dilated.   4. The mitral valve is degenerative. Trivial mitral valve regurgitation. No evidence of mitral stenosis. Moderate mitral annular calcification.   5. The aortic valve is tricuspid. There is moderate calcification of the aortic valve. There is moderate thickening of the aortic valve. Aortic valve regurgitation is trivial. Aortic valve sclerosis/calcification is present, without any evidence of  aortic stenosis.   6. The inferior vena cava is normal in size with greater than 50%  respiratory variability, suggesting right atrial pressure of 3 mmHg.    Recent Labs: 06/12/2019 Hemoglobin 12.9, creatinine 1.4 (GFR 37), potassium 4.6, normal liver function tests, Hemoglobin A1c 8.8%, Total cholesterol 137, triglycerides 116, HDL 48, LDL 68  07/01/2020 Hemoglobin 11.1, TSH 6.7, BUN 42, creatinine 1.33, potassium 4.3 Hemoglobin A1c 7.3% Total cholesterol 153, HDL 57, LDL 74, triglycerides 111     Latest Ref Rng & Units 05/15/2021    3:52 PM 04/26/2021    3:13 AM 04/25/2021   10:57 PM  BMP  Glucose 70 - 99 mg/dL 244  318  357   BUN 10 - 36 mg/dL 31  36  39   Creatinine 0.57 - 1.00  mg/dL 1.68  1.60  1.75   BUN/Creat Ratio 12 - 28 18     Sodium 134 - 144 mmol/L 137  132  133   Potassium 3.5 - 5.2 mmol/L 5.1  4.9  4.7   Chloride 96 - 106 mmol/L 102  104  101   CO2 20 - 29 mmol/L '19  17  20   '$ Calcium 8.7 - 10.3 mg/dL 9.6  8.5  9.1      06/16/2021 Potassium 4.8, creatinine 1.65, glucose 280, normal liver function tests Hemoglobin 10.3, MCV 107, hemoglobin A1c 8.1%  Lipid Panel  No results found for: "CHOL", "TRIG", "HDL", "CHOLHDL", "VLDL", "LDLCALC", "LDLDIRECT", "LABVLDL"  06/16/2021 Cholesterol 146, triglycerides 166, HDL 47, LDL 71  ASSESSMENT:    1. Chronic diastolic CHF (congestive heart failure) ([place])   2. Coronary artery disease involving native coronary artery of native heart without angina pectoris   3. Aneurysm of ascending aorta without rupture ([place])   4. Atherosclerosis of aorta ([place])   5. Essential hypertension   6. Hyperlipidemia LDL goal <70   7. Type 2 diabetes mellitus with other circulatory complication, without long-term current use of insulin ([place])   8. Stage 3b chronic kidney disease ([place])         PLAN:  In order of problems listed above:  CHF: She feels that her breathing has improved substantially.  No limitations at this time.  No clinical findings to suggest hypervolemia.  It is interesting to note that she weighs about 10 pounds less than she did at her last appointment, taking a tiny dose of loop diuretic.  Consider adding SGLT2 inhibitor, although I remain uncertain that her shortness of breath was related to diastolic heart failure. CAD: It is possible that her problems with shortness of breath for an anginal equivalent.  Even when she had severe LAD and left circumflex coronary stenosis she never had angina, but had shortness of breath.  The current PET findings are concerning for recurrent multivessel CAD, although no distinct single area of abnormal perfusion is seen.  Unlikely that she would have anatomy amenable to percutaneous revascularization and she is not a candidate for surgical bypass.  Previous cardiac catheterization could not be performed via the radial artery she had a serious groin hemorrhage with her previous procedure.  In addition she has significant renal  dysfunction.  I would encourage medical therapy. TAAA: Asymptomatic and stable in size on serial studies most recently estimated at 4.8 cm on 08/05/2020.  She has substantial aortic atherosclerosis throughout the entire thoracic aorta.  The distal descending thoracic aorta is also aneurysmal with a maximum diameter 4.3 cm.  Follow-up study should be performed without contrast due to renal dysfunction. HTN: Today her blood pressure is quite high, but this is an isolated finding.  She is not taking NSAIDs and has taken her usual medication.  Would like to avoid thiazide diuretics due to renal dysfunction.  She is on maximum dose of ARB.  Prefer use of beta-blockers due to CAD and aortic aneurysm, but note that she was severely intolerant of carvedilol.  Asked her to keep her attention to her blood pressure at home and we might start amlodipine. HLP: On statin, with all lipid parameters at target range or close to that.  Prefer LDL less than 70. DM: Borderline adequate glycemic control.  She has lost some weight since her last hemoglobin A1c was checked.  Follow-up testing may show better values.  If additional medications are necessary, prefer  the use of SGLT2 inhibitors, also indicated for diastolic heart failure and renal function protection. CKD 3B: Recent deterioration in creatinine level.  Current GFR around 35, down from 40-45 in 2021.  However, no real change in creatinine over the last 12 months. Shoulder arthritis: Clinically improved.  No longer considering surgery.  Elevated blood pressure noted today, but was an isolated finding.  She will check her blood pressure at home and we will add more medications if this is a consistent finding.  Medication Adjustments/Labs and Tests Ordered: Current medicines are reviewed at length with the patient today.  Concerns regarding medicines are outlined above.  Medication changes, Labs and Tests ordered today are listed in the Patient Instructions  below. Patient Instructions  Medication Instructions:  The current medical regimen is effective;  continue present plan and medications.  *If you need a refill on your cardiac medications before your next appointment, please call your pharmacy*   Follow-Up: At [facility], you and your health needs are our priority.  As part of our continuing mission to provide you with exceptional heart care, we have created designated Provider Care Teams.  These Care Teams include your primary Cardiologist (physician) and Advanced Practice Providers (APPs -  Physician Assistants and Nurse Practitioners) who all work together to provide you with the care you need, when you need it.  We recommend signing up for the patient portal called "MyChart".  Sign up information is provided on this After Visit Summary.  MyChart is used to connect with patients for Virtual Visits (Telemedicine).  Patients are able to view lab/test results, encounter notes, upcoming appointments, etc.  Non-urgent messages can be sent to your provider as well.   To learn more about what you can do with MyChart, go to [email or web address].    Your next appointment:   6 month(s)  The format for your next appointment:   In Person  Provider:   Sanda Klein, MD    Please send blood pressure readings via mychart.        Signed, Sanda Klein, MD  10/09/2021 9:16 AM    [facility] [address], [place], [place]  [address] Phone: [phone]; Fax: [phone]

## 2021-10-06 NOTE — Patient Instructions (Signed)
Medication Instructions:  The current medical regimen is effective;  continue present plan and medications.  *If you need a refill on your cardiac medications before your next appointment, please call your pharmacy*   Follow-Up: At [facility], you and your health needs are our priority.  As part of our continuing mission to provide you with exceptional heart care, we have created designated Provider Care Teams.  These Care Teams include your primary Cardiologist (physician) and Advanced Practice Providers (APPs -  Physician Assistants and Nurse Practitioners) who all work together to provide you with the care you need, when you need it.  We recommend signing up for the patient portal called "MyChart".  Sign up information is provided on this After Visit Summary.  MyChart is used to connect with patients for Virtual Visits (Telemedicine).  Patients are able to view lab/test results, encounter notes, upcoming appointments, etc.  Non-urgent messages can be sent to your provider as well.   To learn more about what you can do with MyChart, go to [email or web address].    Your next appointment:   6 month(s)  The format for your next appointment:   In Person  Provider:   Sanda Klein, MD    Please send blood pressure readings via mychart.

## 2021-10-07 ENCOUNTER — Telehealth: Payer: Self-pay | Admitting: Cardiovascular Disease

## 2021-10-07 NOTE — Telephone Encounter (Signed)
Left message over concern for blood pressure reading sent in. Recommended patient call [phone] and ask for cariologist on call.

## 2021-10-07 NOTE — Telephone Encounter (Signed)
Pt c/o BP issue: STAT if pt c/o blurred vision, one-sided weakness or slurred speech  1. What are your last 5 BP readings? 159/102 4 pm today  2. Are you having any other symptoms (ex. Dizziness, headache, blurred vision, passed out)? no  3. What is your BP issue? Patient's son calling with BP reading for today. He says they were told to call in with the patient's BP readings. He says he will call back tomorrow.

## 2021-10-08 NOTE — Telephone Encounter (Signed)
Pt called back stating she took her medication after eating at 7 am and she took her bp at 8:30am

## 2021-10-08 NOTE — Telephone Encounter (Signed)
Patient stated she took her BP 1 1/2 hours after medication this AM. 155/92, P 81. She denies chest pain, dizziness, or headache. Her son will upload BP readings for this week on MyChart. She stated she was more active on Wednesday and became weak, but she is better today. She is having blood work done tomorrow and has an appointment with PCP on 9/8. The will fax bloodd work results to Dr. Sallyanne Kuster.

## 2021-10-08 NOTE — Telephone Encounter (Signed)
Left message that BP reading of 155/92, P 81 was received. Asked if BP is taken 2 hours after BP meds.

## 2021-10-08 NOTE — Telephone Encounter (Signed)
Pt called back stating to call on home # [phone] And sons number # [phone]  But call her on home #

## 2021-10-08 NOTE — Telephone Encounter (Signed)
   Pt is calling back, she said she checked her BP this morning around 8:30 am her BP 155/92 and HR 81. She said, no symptoms

## 2021-10-09 ENCOUNTER — Encounter: Payer: Self-pay | Admitting: Cardiovascular Disease

## 2021-10-09 DIAGNOSIS — E1122 Type 2 diabetes mellitus with diabetic chronic kidney disease: Secondary | ICD-10-CM | POA: Diagnosis not present

## 2021-10-09 DIAGNOSIS — N184 Chronic kidney disease, stage 4 (severe): Secondary | ICD-10-CM | POA: Diagnosis not present

## 2021-10-09 DIAGNOSIS — I130 Hypertensive heart and chronic kidney disease with heart failure and stage 1 through stage 4 chronic kidney disease, or unspecified chronic kidney disease: Secondary | ICD-10-CM | POA: Diagnosis not present

## 2021-10-09 DIAGNOSIS — D649 Anemia, unspecified: Secondary | ICD-10-CM | POA: Diagnosis not present

## 2021-10-09 MED ORDER — AMLODIPINE BESYLATE 2.5 MG PO TABS: 2.5000 mg | ORAL_TABLET | Freq: Every day | ORAL | 3 refills | Status: DC

## 2021-10-09 NOTE — Telephone Encounter (Signed)
Please start amlodipine 2.5 mg daily. Continue the valsartan and the metoprolol. Please call us back w BP readings next week. Have not received the labs yet.

## 2021-10-09 NOTE — Telephone Encounter (Signed)
See MyChart message

## 2021-10-15 DIAGNOSIS — I2510 Atherosclerotic heart disease of native coronary artery without angina pectoris: Secondary | ICD-10-CM | POA: Diagnosis not present

## 2021-10-15 DIAGNOSIS — N1832 Chronic kidney disease, stage 3b: Secondary | ICD-10-CM | POA: Diagnosis not present

## 2021-10-15 DIAGNOSIS — I5022 Chronic systolic (congestive) heart failure: Secondary | ICD-10-CM | POA: Diagnosis not present

## 2021-10-15 DIAGNOSIS — I130 Hypertensive heart and chronic kidney disease with heart failure and stage 1 through stage 4 chronic kidney disease, or unspecified chronic kidney disease: Secondary | ICD-10-CM | POA: Diagnosis not present

## 2021-10-15 DIAGNOSIS — E1122 Type 2 diabetes mellitus with diabetic chronic kidney disease: Secondary | ICD-10-CM | POA: Diagnosis not present

## 2021-10-15 DIAGNOSIS — I7121 Aneurysm of the ascending aorta, without rupture: Secondary | ICD-10-CM | POA: Diagnosis not present

## 2021-10-15 DIAGNOSIS — E1159 Type 2 diabetes mellitus with other circulatory complications: Secondary | ICD-10-CM | POA: Diagnosis not present

## 2021-11-30 ENCOUNTER — Telehealth: Payer: Self-pay

## 2021-11-30 NOTE — Patient Outreach (Signed)
  Care Coordination   Initial Visit Note   11/30/2021 Name: MYLO DRISKILL MRN: [id number] DOB: 1928-03-18  FAITHE ARIOLA is a 88 y.o. year old female who sees [name], [place] for primary care. I spoke with  Chilton Greathouse by phone today.  Offered [facility] services  What matters to the patients health and wellness today?  none    Goals Addressed             This Visit's Progress    COMPLETED: Care Coordination Activities-No follow up required       Care Coordination Interventions: Offered [facility] care Management services for diabetes management.  A1c 8.1 earlier this year. Patient declined services as she has gotten her A1c down now to 7.1.           SDOH assessments and interventions completed:  Yes     Care Coordination Interventions Activated:  Yes   Care Coordination Interventions:  Yes, provided   Follow up plan: No further intervention required.   Encounter Outcome:  Pt. Visit Completed    Jone Baseman, RN, MSN [facility] Care Management Coordinator Direct Line [phone]

## 2021-11-30 NOTE — Patient Instructions (Signed)
Visit Information  Thank you for taking time to visit with me today. Please don't hesitate to contact me if I can be of assistance to you.   Following are the goals we discussed today:   Goals Addressed             This Visit's Progress    COMPLETED: Care Coordination Activities-No follow up required       Care Coordination Interventions: Offered [facility] care Management services for diabetes management.  A1c 8.1 earlier this year. Patient declined services as she has gotten her A1c down now to 7.1.           If you are experiencing a Mental Health or [place] or need someone to talk to, please call the Suicide and Crisis Lifeline: [phone]   Patient verbalizes understanding of instructions and care plan provided today and agrees to view in [place]. Active MyChart status and patient understanding of how to access instructions and care plan via MyChart confirmed with patient.     No further follow up required: patient declined  Jone Baseman, RN, MSN [facility] Care Management Coordinator Direct Line [phone]

## 2022-04-13 NOTE — Progress Notes (Addendum)
Cardiology Office Note:    Date:  04/27/2022   ID:  Karen [name], DOB 1929/02/02, MRN [id number]  PCP:  [name], DO  Cardiologist:  Sanda Klein, MD  Electrophysiologist:  None   Referring MD: [name], DO   Chief Complaint: follow-up of CAD  History of Present Illness:    Karen [name] is a 89 y.o. female with a history of CAD s/p DES to LAD and LCX in 08/3218, chronic diastolic CHF, ascending aortic aneurysm, hypertension, type 2 diabetes, fibromyalgia, and arthritis who is followed by Dr. Sallyanne Kuster and presents today for follow-up of CAD.   Patient has a history of CAD with LHC in 04/2013 showing 30-40% stenosis of distal left main, 40% stenosis of proximal LAD followed by 90% stenosis of mid to distal LAD, 70-90% stenosis of mid to distal LCx, 40-50% of mid RCA, and 20-80% stenosis of PDA. She underwent successful PCI with DES to LAD and LCx lesions. Echo prior to cath in 03/2013 showed mildly reduced LVEF of 45-50% with sepal and apical hypokinesis and LV gram on cath showed LVEF of 50%. Repeat Echo in 04/2020 showed improvement of LVEF to 60-65% with normal wall motion and severe LVH of the basal-septal segment as well as moderate mitral stenosis and mild AI. Routine CTA  in 07/2020 for routine monitoring of thoracic aortic aneurysm which showed a stable 4.8 cm ascending thoracic aortic aneurysm and a stable 4.3 cm aneurysm dilation of the distal descending thoracic aorta.   She was admitted from 04/08/2021 to 04/11/2021 for a left hip dislocation following a mechanical fall where she slipped in the shower.  She was seen by Ortho and ultimately underwent a left total hip revision on 04/09/2021.  She initially did well postoperatively and was discharged on aspirin for DVT prophylaxis but developed a hematoma with this requiring drainage on 04/21/2021.    She was then readmitted from 04/25/2021 to 05/01/2021 after presenting with shortness of breath and inability to lay flat.  There  was concern for PE given recent lower extremity surgery but CTA was not done due to underlying renal function.  However, VQ scan showed no PE.  Patient ultimately reported a 22-month history of dyspnea on exertion but denied any chest pain.  High-sensitivity troponin was mildly elevated but flat peaking at 192.  BNP was mildly elevated at 162.  Chest x-ray showed no signs of edema.  Echo showed LVEF of 50-55% with distal septal hypokinesis, mild asymmetric LVH of the basal and septal segments and indeterminate diastolic parameters. Of note, no mitral stenosis was noted on this Echo.  She was given a dose of IV Lasix with some improvement in her breathing.  Given new dyspnea on exertion and new wall motion abnormality in distribution of prior stent, there was concern for ischemia.  Cardiac catheterization was discussed but decision was made to defer this and see outpatient does with increased conditioning at home.  She was discharged on DAPT with aspirin and Plavix for medical management of NSTEMI. A cardiac PET was ordered as an outpatient for further evaluation of her dyspnea given problems with recurrent hematoma from left total hip revision. This was performed in 06/2021 and was abnormal. It showed no evidence of ischemia but myocardial blood flow reserve was abnormal at 1.22. This was felt to be influenced by high rest flow due to elevated BP. Regardless, stress myocardial blood flow was abnormal at 1.8 ml/min/g. Overall findings were concerning for left main or 3 vessel  CAD due to drop in EF and TID despite normal perfusion imaging.   She was seen by Dr. Sallyanne Kuster in 06/2021 and cardiac PET results were discussed. Cardiac catheterization was recommended before shoulder surgery but patient declined an was planning to wait on shoulder surgery. She was last seen by Dr. Sallyanne Kuster in 09/2021 at which time she was doing much better. Her dyspnea had essentially resolved and her right shoulder was doing better. Her left  hip seroma had finally stopped re-accumulating as well. Her BP was markedly elevated in the office but this was an isolated finding. She was asked to monitor this closely at home. She was ultimately restarted on Amlodipine. Continue medical therapy was recommended for her CAD.   Patient presents today for follow-up. Here with son, Remo Lipps. She is doing very well and looks much better than when I last saw her. She denies any chest pain or shortness of breath. No orthopnea or PND. She has very mild ankle edema but no significant lower extremity swelling. No palpitations, lightheadedness, dizziness, or syncope. She had one brief episode of weakness when ambulating that resolved after taking a few deep breaths but this was an isolated episodes. She bruises easily on DAPT but no abnormal bleeding in urine or stools.   BP elevated in the office today. Initially 160100 and then 152/88 on my personal recheck at the end of visit. BP at home as been ranging from from 128/80 to 152/90 at home but systolic BP mostly >875.  Reviewed Labs from PCP's Office on 04/15/2022: - CBC: WBC 7.6, Hgb 11.8, Plts 158 - CMET: Na 136, K4.6, Glucose 212, BUN 49, Cr 1.88, Albumin 4.2, AST 18, ALT 14, Alk Phos 68, Total Bili 0.4 - Lipid panel: Total Cholesterol 125, Triglycerides 127, HDL 40, LDL 62 - Hemoglobin A1c: 8.9   Past Medical History:  Diagnosis Date   Arthritis    Ascending aortic aneurysm (HCC)    Chronic diastolic CHF (congestive heart failure) (HCC)    Constipation    Coronary artery disease 04/2013   Three-vessel disease, initial medical therapy then stenting of the LAD and CFX 10/2013   Diabetes mellitus    TYPE 2   Fibromyalgia    H/O: gout    Heart murmur    Hx: UTI (urinary tract infection)    Hypertension     Past Surgical History:  Procedure Laterality Date   [place] CATHETERIZATION  04/2013   Left main 30-40%, prox LAD 40%, mid/distal LAD 90%, CFX 70%,  OM 3 90%, RCA 40-50%, PDA 20-80%, EF 50%    CHOLECYSTECTOMY  1951   Closed reduction of Left Total Hip  2008   x 3   CORONARY STENT PLACEMENT  10/11/2013   2.25 x 20 mm Promus DES to mid/distal LAD & 3.5 x 12 mm Promus DES Mid CX         DR [place]   HERNIA REPAIR  1963   JOINT REPLACEMENT  left hip-2007, left knee-2000, right knee-1997   Left Hip, Bilateral Knee replacements   KNEE SURGERY  1997/2000   LEFT HEART CATHETERIZATION WITH CORONARY ANGIOGRAM N/A 04/25/2013   Procedure: LEFT HEART CATHETERIZATION WITH CORONARY ANGIOGRAM;  Surgeon: Sanda Klein, MD;  Location: [facility] CATH LAB;  Service: Cardiovascular;  Laterality: N/A;   PERCUTANEOUS CORONARY STENT INTERVENTION (PCI-S) N/A 10/11/2013   Procedure: PERCUTANEOUS CORONARY STENT INTERVENTION (PCI-S);  Surgeon: Peter M [place], MD;  Location: [facility] CATH LAB;  Service: Cardiovascular;  Laterality: N/A;   REPLACEMENT TOTAL HIP W/  RESURFACING IMPLANTS  0272,5366   Right Foot Surgery  2004   x 2   Salivary Gland Stone Extraction     SPINAL FUSION  2006   TOTAL HIP ARTHROPLASTY Right 05/14/2020   Procedure: TOTAL HIP ARTHROPLASTY ANTERIOR APPROACH;  Surgeon: Gaynelle Arabian, MD;  Location: [facility] ORS;  Service: Orthopedics;  Laterality: Right;  16min   TOTAL HIP REVISION Left 04/08/2021   Procedure: TOTAL HIP REVISION;  Surgeon: Gaynelle Arabian, MD;  Location: [facility] ORS;  Service: Orthopedics;  Laterality: Left;    Current Medications: Current Meds  Medication Sig   aspirin 81 MG chewable tablet Chew 1 tablet (81 mg total) by mouth daily.   atorvastatin (LIPITOR) 40 MG tablet Take 40 mg by mouth at bedtime.   Calcium Carbonate-Vitamin D 600-400 MG-UNIT tablet Take 1 tablet by mouth every morning.   clopidogrel (PLAVIX) 75 MG tablet Take 1 tablet (75 mg total) by mouth daily.   Coenzyme Q10 (CO Q 10) 100 MG CAPS Take 300 mg by mouth every morning.   docusate sodium (COLACE) 100 MG capsule Take 100 mg by mouth as needed.   furosemide (LASIX) 20 MG tablet  Take 1 tablet (20 mg total) by mouth daily.   glipiZIDE (GLUCOTROL) 10 MG tablet Take 10 mg by mouth 2 (two) times daily.   Magnesium 250 MG TABS Take 250 mg by mouth every morning.   metFORMIN (GLUCOPHAGE) 1000 MG tablet Take 1 tablet (1,000 mg total) by mouth 2 (two) times daily with a meal. Hold for 2 days, restart on 10/14/2013 (Patient taking differently: Take 1,000 mg by mouth 2 (two) times daily with a meal.)   methocarbamol (ROBAXIN) 500 MG tablet methocarbamol 500 mg tablet  TAKE 1 TABLET BY MOUTH FOUR TIMES DAILY AS DIRECTED   metoprolol succinate (TOPROL-XL) 100 MG 24 hr tablet Take 1 tablet (100 mg total) by mouth every morning. Take with or immediately following a meal.   mirtazapine (REMERON) 15 MG tablet Take 15 mg by mouth at bedtime.   Multiple Vitamin (MULTIVITAMIN WITH MINERALS) TABS tablet Take 1 tablet by mouth every morning.   nitroGLYCERIN (NITROSTAT) 0.4 MG SL tablet Place 1 tablet (0.4 mg total) under the tongue every 5 (five) minutes as needed for chest pain (or SOB).   Polyethyl Glycol-Propyl Glycol (SYSTANE ULTRA) 0.4-0.3 % SOLN Place 1 drop into both eyes daily as needed (dry eyes).   potassium chloride SA (K-DUR,KLOR-CON) 20 MEQ tablet Take 1 tablet (20 mEq total) by mouth 2 (two) times daily. (Patient taking differently: Take 20 mEq by mouth 2 (two) times daily after a meal.)   QUEtiapine (SEROQUEL) 200 MG tablet Take 200 mg by mouth at bedtime.   valsartan (DIOVAN) 320 MG tablet Take 1 tablet (320 mg total) by mouth daily. (Patient taking differently: Take 320 mg by mouth every morning.)   [DISCONTINUED] amLODipine (NORVASC) 2.5 MG tablet Take 1 tablet (2.5 mg total) by mouth daily.     Allergies:   Lisinopril; Pneumococcal vaccines; Antihistamines, diphenhydramine-type; Carvedilol; Codeine; Morphine and related; and Ultram [tramadol hcl]   Social History   Socioeconomic History   Marital status: Widowed    Spouse name: Not on file   Number of children: Not on  file   Years of education: Not on file   Highest education level: Not on file  Occupational History   Not on file  Tobacco Use   Smoking status: Never  Smokeless tobacco: Never  Vaping Use   Vaping Use: Never used  Substance and Sexual Activity   Alcohol use: No   Drug use: No   Sexual activity: Never  Other Topics Concern   Not on file  Social History Narrative   Not on file   Social Determinants of Health   Financial Resource Strain: Not on file  Food Insecurity: Not on file  Transportation Needs: No Transportation Needs (11/30/2021)   PRAPARE - Hydrologist (Medical): No    Lack of Transportation (Non-Medical): No  Physical Activity: Not on file  Stress: Not on file  Social Connections: Not on file     Family History: The patient's family history includes Cancer in her brother and mother; Heart attack in her brother and father; Stroke in her father.  ROS:   Please see the history of present illness.     EKGs/Labs/Other Studies Reviewed:    The following studies were reviewed:  Chest CTA 08/05/2020: Impression: 1. Stable 4.8 cm thoracic aortic aneurysm. Ascending thoracic aortic aneurysm. Recommend semi-annual imaging followup by CTA or MRA andreferral to cardiothoracic surgery if not already obtained. This recommendation follows 2010 ACCF/AHA/AATS/ACR/ASA/SCA/SCAI/SIR/STS/SVM Guidelines for the Diagnosis and Management of Patients With Thoracic Aortic Disease. Circulation. 2010; 121: O962-X528. 2. Stable 4.3 cm aneurysmal dilation of the distal descending thoracic aorta. 3. Aortic and coronary artery atherosclerotic calcifications. 4. Sequelae of chronic pancreatitis, unchanged compared to prior. _______________   Echocardiogram 04/27/2021: Impression: 1. Distal septal hypokinesis . Left ventricular ejection fraction, by  estimation, is 50 to 55%. The left ventricle has low normal function. The  left ventricle demonstrates  regional wall motion abnormalities (see  scoring diagram/findings for  description). There is mild asymmetric left ventricular hypertrophy of the  basal and septal segments. Left ventricular diastolic parameters are  indeterminate.   2. Right ventricular systolic function is normal. The right ventricular  size is normal.   3. Left atrial size was mildly dilated.   4. The mitral valve is degenerative. Trivial mitral valve regurgitation.  No evidence of mitral stenosis. Moderate mitral annular calcification.   5. The aortic valve is tricuspid. There is moderate calcification of the  aortic valve. There is moderate thickening of the aortic valve. Aortic  valve regurgitation is trivial. Aortic valve sclerosis/calcification is  present, without any evidence of  aortic stenosis.   6. The inferior vena cava is normal in size with greater than 50%  respiratory variability, suggesting right atrial pressure of 3 mmHg. _______________  Cardiac PET 06/30/2021:   LV perfusion is normal. There is no evidence of ischemia. There is no evidence of infarction. TID was present (1.39) and confirmed visually. EF dropped with stress (68%->66%). Myocardial blood flow reserve abnormal (1.22) but this was influenced by high rest flow due to elevated blood pressure (1.50 ml/min/g). Regardless, stress myocardial blood flow abnormal (1.8 ml/min/g). Overall, findings are concerning for left main or 3-vessel CAD due to drop in EF and TID despite normal perfusion imaging. MBF abnormal as well. Would consider invasive angiography for clarification.   Rest left ventricular function is normal. Rest EF: 68 %. Stress left ventricular function is normal. Stress EF: 66 %. End diastolic cavity size is normal.   Myocardial blood flow was computed to be 1.68ml/g/min at rest and 1.42ml/g/min at stress. Global myocardial blood flow reserve was 1.22 and was abnormal.   Coronary calcium assessment not performed due to prior  revascularization.   The study is normal.  The study is high risk.   Electronically signed by Eleonore Chiquito, MD   EKG:  EKG ordered today. EKG personally reviewed and demonstrates normal sinus rhythm, rate 78 bpm, with incomplete RBBB but no acute ST/T changes compared to prior tracing.  Recent Labs: 05/15/2021: BNP 201.2; BUN 31; Creatinine, Ser 1.68; Hemoglobin 10.7; Platelets 224; Potassium 5.1; Sodium 137  Recent Lipid Panel No results found for: "CHOL", "TRIG", "HDL", "CHOLHDL", "VLDL", "LDLCALC", "LDLDIRECT"  Physical Exam:    Vital Signs: BP (!) 152/88   Pulse 78   Ht 5\' 4"  (1.626 m)   Wt 126 lb 9.6 oz (57.4 kg)   LMP 05/10/1998   SpO2 98%   BMI 21.73 kg/m     Wt Readings from Last 3 Encounters:  04/27/22 126 lb 9.6 oz (57.4 kg)  10/06/21 123 lb 6.4 oz (56 kg)  07/03/21 135 lb (61.2 kg)     General: 89 y.o. thin Caucasian female in no acute distress. HEENT: Normocephalic and atraumatic. Sclera clear.  Neck: Supple.  No JVD. Heart: RRR. II-III/Vi systolic murmur. No gallops or rubs. Radial and distal pedal pulses 2+ and equal bilaterally. Lungs: No increased work of breathing. Clear to ausculation bilaterally. No wheezes, rhonchi, or rales.  Abdomen: Soft, non-distended, and non-tender to palpation.  Extremities: Mild ankle edema bilaterally.   Skin: Warm and dry. Neuro: Alert and oriented x3. No focal deficits. Psych: Normal affect. Responds appropriately.   Assessment:    1. Coronary artery disease involving native coronary artery of native heart without angina pectoris   2. Chronic diastolic CHF (congestive heart failure) ([place])   3. Aneurysm of ascending aorta without rupture ([place])   4. Primary hypertension   5. Hyperlipidemia, unspecified hyperlipidemia type   6. Type 2 diabetes mellitus with complication, without long-term current use of insulin (HCC)   7. CKD stage IIIb-IV     Plan:    CAD Patient has known CAD with prior DES to LAD and LCx in 2015. She  was admitted in 04/2021 with NSTEMI after presenting with dyspnea after recent hip surgery. Echo showed LVEF of 50-55% with distal septal hypokinesis.  Cardiac catheterization was discussed but patient and son elected to defer cath at that time and to see how she would do with increased conditioning at home. Outpatient cardiac PET in 06/2021 was abnormal and concerning for  left main or 3 vessel CAD due to drop in EF and TID despite normal perfusion imaging. Dyspnea improved and it was felt to be unlikely that she would have anatomy amenable to percutaneous revascularization and she would not be a candidate for surgery. She also had a serious groin hemorrhage during a prior cardiac catheterization and radial access was not able to be used. Given all of this as well as known CKD, decision was made to treat medically.   - She is doing well with no chest pain or dyspnea. - Currently on DAPT with Aspirin 81mg  daily and Plavix 75mg  daily. Continue for now. However, will discuss with Dr. Sallyanne Kuster on if Plavix can be stopped now that she is about 1 year out from being diagnosed with NSTEMI (looks like she was started on Plavix on 05/01/2021). - Continue beta-blocker and high-intensity statin.  Chronic Diastolic CHF Echo on 4/62/8638 showed LVEF of 50-55% with distal septal hypokinesis, mild asymmetric LVH of the basal and septal segments and indeterminate diastolic parameters. - Euvolemic on exam.  - Continue Lasix 20mg  daily.    Ascending Thoracic Aortic Aneurysm Most recent  CTA in 07/2020 showed stable 4.8 cm ascending thoracic aortic aneurysm and a stable 4.3 cm aneurysm dilation of the distal descending thoracic aorta. Both areas unchanged from 2021. Cardiac PET in 06/2021 showed similar finding of 4.8 cm thoracic aortic aneurysm.  - Discussed routine surveillance with patient. However, given advanced age, she would not want surgery and would not be a candidate for it either. After shared decision making, will  hold off on repeat imaging at this time.   Hypertension BP elevated. Initially 160/100 and then 152/88 on my personally recheck at the end of visit.  - Current medications: Amlodipine 2.5mg  daily, Valsartan 320mg  daily, and Toprol-XL 100mg  daily. Will increase Amlodipine to 5mg  daily (previously on this dose and tolerated it well). - Asked patient to keep BP/HR log for 2 weeks and then send this to [place].    Hyperlipidemia Recent lipid panel from 04/15/2022 (at PCP's office): Total Cholesterol 125, Triglycerides 127, HDL 40, LDL 62. - Continue Lipitor 40mg  daily.  - Labs followed by PCP.   Type 2 Diabetes Mellitus Hemoglobin A1c 8.9 on 04/15/2022 - On Metformin and Glipizide at home.  - Management per PCP.     CKD Stage III-IV Baseline creatinine around 1.7 - Most recent labs from PCP office on 04/15/2022 showed creatinine of 1.88. GFR 25.  Disposition: Follow up in 6 months.  ADDENDUM 05/12/2022: Spoke with Dr Sallyanne Kuster. Okay to stop Plavix now that we are >1 year out from NSTEMI. Will continue Aspirin 81mg  daily. Otherwise, continue plan as above. Will notify patient of this via MyChart and remove Plavix from medication list.     Medication Adjustments/Labs and Tests Ordered: Current medicines are reviewed at length with the patient today.  Concerns regarding medicines are outlined above.  Orders Placed This Encounter  Procedures   EKG 12-Lead   Meds ordered this encounter  Medications   amLODipine (NORVASC) 5 MG tablet    Sig: Take 1 tablet (5 mg total) by mouth daily.    Dispense:  90 tablet    Refill:  3    Dose change new Rx    Patient Instructions  Medication Instructions:   INCREASE Amlodipine to 5 mg daily   *If you need a refill on your cardiac medications before your next appointment, please call your pharmacy*  Lab Work: NONE ordered at this time of appointment   If you have labs (blood work) drawn today and your tests are completely normal, you will receive your  results only by: MyChart Message (if you have MyChart) OR A paper copy in the mail If you have any lab test that is abnormal or we need to change your treatment, we will call you to review the results.  Testing/Procedures: NONE ordered at this time of appointment   Follow-Up: At [facility], you and your health needs are our priority.  As part of our continuing mission to provide you with exceptional heart care, we have created designated Provider Care Teams.  These Care Teams include your primary Cardiologist (physician) and Advanced Practice Providers (APPs -  Physician Assistants and Nurse Practitioners) who all work together to provide you with the care you need, when you need it.   Your next appointment:   6 month(s)  Provider:   Sanda Klein, MD  or Sande Rives, PA-C        Other Instructions     Signed, Darreld Mclean, PA-C  04/27/2022 4:51 PM    [facility]

## 2022-04-15 DIAGNOSIS — N1832 Chronic kidney disease, stage 3b: Secondary | ICD-10-CM | POA: Diagnosis not present

## 2022-04-15 DIAGNOSIS — I5022 Chronic systolic (congestive) heart failure: Secondary | ICD-10-CM | POA: Diagnosis not present

## 2022-04-15 DIAGNOSIS — E1159 Type 2 diabetes mellitus with other circulatory complications: Secondary | ICD-10-CM | POA: Diagnosis not present

## 2022-04-15 DIAGNOSIS — I130 Hypertensive heart and chronic kidney disease with heart failure and stage 1 through stage 4 chronic kidney disease, or unspecified chronic kidney disease: Secondary | ICD-10-CM | POA: Diagnosis not present

## 2022-04-15 DIAGNOSIS — I2510 Atherosclerotic heart disease of native coronary artery without angina pectoris: Secondary | ICD-10-CM | POA: Diagnosis not present

## 2022-04-15 DIAGNOSIS — E1122 Type 2 diabetes mellitus with diabetic chronic kidney disease: Secondary | ICD-10-CM | POA: Diagnosis not present

## 2022-04-15 DIAGNOSIS — I7121 Aneurysm of the ascending aorta, without rupture: Secondary | ICD-10-CM | POA: Diagnosis not present

## 2022-04-16 LAB — LAB REPORT - SCANNED
A1c: 8.9
Creatinine, POC: 36.3 mg/dL
EGFR: 25
Microalb Creat Ratio: 770
Microalbumin, Urine: 279.4

## 2022-04-22 DIAGNOSIS — G4700 Insomnia, unspecified: Secondary | ICD-10-CM | POA: Diagnosis not present

## 2022-04-22 DIAGNOSIS — E039 Hypothyroidism, unspecified: Secondary | ICD-10-CM | POA: Diagnosis not present

## 2022-04-22 DIAGNOSIS — E559 Vitamin D deficiency, unspecified: Secondary | ICD-10-CM | POA: Diagnosis not present

## 2022-04-22 DIAGNOSIS — E1159 Type 2 diabetes mellitus with other circulatory complications: Secondary | ICD-10-CM | POA: Diagnosis not present

## 2022-04-22 DIAGNOSIS — M1990 Unspecified osteoarthritis, unspecified site: Secondary | ICD-10-CM | POA: Diagnosis not present

## 2022-04-22 DIAGNOSIS — D649 Anemia, unspecified: Secondary | ICD-10-CM | POA: Diagnosis not present

## 2022-04-22 DIAGNOSIS — Z0000 Encounter for general adult medical examination without abnormal findings: Secondary | ICD-10-CM | POA: Diagnosis not present

## 2022-04-22 DIAGNOSIS — E1122 Type 2 diabetes mellitus with diabetic chronic kidney disease: Secondary | ICD-10-CM | POA: Diagnosis not present

## 2022-04-22 DIAGNOSIS — E7800 Pure hypercholesterolemia, unspecified: Secondary | ICD-10-CM | POA: Diagnosis not present

## 2022-04-22 DIAGNOSIS — I10 Essential (primary) hypertension: Secondary | ICD-10-CM | POA: Diagnosis not present

## 2022-04-27 ENCOUNTER — Ambulatory Visit: Payer: Medicare Other | Attending: Student | Admitting: Student

## 2022-04-27 ENCOUNTER — Encounter: Payer: Self-pay | Admitting: Student

## 2022-04-27 VITALS — BP 152/88 | HR 78 | Ht 64.0 in | Wt 126.6 lb

## 2022-04-27 DIAGNOSIS — E118 Type 2 diabetes mellitus with unspecified complications: Secondary | ICD-10-CM | POA: Diagnosis not present

## 2022-04-27 DIAGNOSIS — I10 Essential (primary) hypertension: Secondary | ICD-10-CM | POA: Insufficient documentation

## 2022-04-27 DIAGNOSIS — E785 Hyperlipidemia, unspecified: Secondary | ICD-10-CM | POA: Diagnosis not present

## 2022-04-27 DIAGNOSIS — I7121 Aneurysm of the ascending aorta, without rupture: Secondary | ICD-10-CM

## 2022-04-27 DIAGNOSIS — I5032 Chronic diastolic (congestive) heart failure: Secondary | ICD-10-CM | POA: Insufficient documentation

## 2022-04-27 DIAGNOSIS — I2510 Atherosclerotic heart disease of native coronary artery without angina pectoris: Secondary | ICD-10-CM | POA: Diagnosis not present

## 2022-04-27 DIAGNOSIS — N1832 Chronic kidney disease, stage 3b: Secondary | ICD-10-CM | POA: Diagnosis not present

## 2022-04-27 MED ORDER — AMLODIPINE BESYLATE 5 MG PO TABS: 5.0000 mg | ORAL_TABLET | Freq: Every day | ORAL | 3 refills | Status: DC

## 2022-04-27 NOTE — Patient Instructions (Signed)
Medication Instructions:   INCREASE Amlodipine to 5 mg daily   *If you need a refill on your cardiac medications before your next appointment, please call your pharmacy*  Lab Work: NONE ordered at this time of appointment   If you have labs (blood work) drawn today and your tests are completely normal, you will receive your results only by: [place] (if you have MyChart) OR A paper copy in the mail If you have any lab test that is abnormal or we need to change your treatment, we will call you to review the results.  Testing/Procedures: NONE ordered at this time of appointment   Follow-Up: At [facility], you and your health needs are our priority.  As part of our continuing mission to provide you with exceptional heart care, we have created designated Provider Care Teams.  These Care Teams include your primary Cardiologist (physician) and Advanced Practice Providers (APPs -  Physician Assistants and Nurse Practitioners) who all work together to provide you with the care you need, when you need it.   Your next appointment:   6 month(s)  Provider:   Sanda Klein, MD  or Sande Rives, PA-C        Other Instructions

## 2022-05-12 NOTE — Addendum Note (Signed)
Addended by: Sande Rives on: 05/12/2022 10:18 AM   Modules accepted: Orders

## 2022-06-15 ENCOUNTER — Other Ambulatory Visit: Payer: Self-pay | Admitting: Student

## 2022-06-15 DIAGNOSIS — I10 Essential (primary) hypertension: Secondary | ICD-10-CM

## 2022-09-20 DIAGNOSIS — Z7984 Long term (current) use of oral hypoglycemic drugs: Secondary | ICD-10-CM | POA: Diagnosis not present

## 2022-09-20 DIAGNOSIS — H43813 Vitreous degeneration, bilateral: Secondary | ICD-10-CM | POA: Diagnosis not present

## 2022-09-20 DIAGNOSIS — E119 Type 2 diabetes mellitus without complications: Secondary | ICD-10-CM | POA: Diagnosis not present

## 2022-09-20 DIAGNOSIS — H353111 Nonexudative age-related macular degeneration, right eye, early dry stage: Secondary | ICD-10-CM | POA: Diagnosis not present

## 2022-09-20 DIAGNOSIS — H40003 Preglaucoma, unspecified, bilateral: Secondary | ICD-10-CM | POA: Diagnosis not present

## 2022-09-20 DIAGNOSIS — H26491 Other secondary cataract, right eye: Secondary | ICD-10-CM | POA: Diagnosis not present

## 2022-09-20 DIAGNOSIS — D3132 Benign neoplasm of left choroid: Secondary | ICD-10-CM | POA: Diagnosis not present

## 2022-09-20 DIAGNOSIS — H3554 Dystrophies primarily involving the retinal pigment epithelium: Secondary | ICD-10-CM | POA: Diagnosis not present

## 2022-09-20 DIAGNOSIS — H04123 Dry eye syndrome of bilateral lacrimal glands: Secondary | ICD-10-CM | POA: Diagnosis not present

## 2022-09-20 DIAGNOSIS — Z961 Presence of intraocular lens: Secondary | ICD-10-CM | POA: Diagnosis not present

## 2022-09-20 DIAGNOSIS — H353122 Nonexudative age-related macular degeneration, left eye, intermediate dry stage: Secondary | ICD-10-CM | POA: Diagnosis not present

## 2022-09-20 DIAGNOSIS — H52203 Unspecified astigmatism, bilateral: Secondary | ICD-10-CM | POA: Diagnosis not present

## 2022-10-05 DIAGNOSIS — H26491 Other secondary cataract, right eye: Secondary | ICD-10-CM | POA: Diagnosis not present

## 2022-10-19 DIAGNOSIS — I10 Essential (primary) hypertension: Secondary | ICD-10-CM | POA: Diagnosis not present

## 2022-10-19 DIAGNOSIS — E039 Hypothyroidism, unspecified: Secondary | ICD-10-CM | POA: Diagnosis not present

## 2022-10-19 DIAGNOSIS — D649 Anemia, unspecified: Secondary | ICD-10-CM | POA: Diagnosis not present

## 2022-10-19 DIAGNOSIS — E559 Vitamin D deficiency, unspecified: Secondary | ICD-10-CM | POA: Diagnosis not present

## 2022-10-19 DIAGNOSIS — E1122 Type 2 diabetes mellitus with diabetic chronic kidney disease: Secondary | ICD-10-CM | POA: Diagnosis not present

## 2022-10-20 ENCOUNTER — Encounter: Payer: Self-pay | Admitting: Cardiovascular Disease

## 2022-10-20 ENCOUNTER — Ambulatory Visit: Payer: Medicare Other | Attending: Cardiovascular Disease | Admitting: Cardiovascular Disease

## 2022-10-20 VITALS — BP 130/70 | HR 80 | Ht 64.0 in | Wt 135.6 lb

## 2022-10-20 DIAGNOSIS — I2510 Atherosclerotic heart disease of native coronary artery without angina pectoris: Secondary | ICD-10-CM | POA: Diagnosis present

## 2022-10-20 DIAGNOSIS — E118 Type 2 diabetes mellitus with unspecified complications: Secondary | ICD-10-CM | POA: Diagnosis not present

## 2022-10-20 DIAGNOSIS — I5032 Chronic diastolic (congestive) heart failure: Secondary | ICD-10-CM | POA: Insufficient documentation

## 2022-10-20 DIAGNOSIS — Z7984 Long term (current) use of oral hypoglycemic drugs: Secondary | ICD-10-CM | POA: Diagnosis not present

## 2022-10-20 DIAGNOSIS — I7121 Aneurysm of the ascending aorta, without rupture: Secondary | ICD-10-CM | POA: Insufficient documentation

## 2022-10-20 DIAGNOSIS — E7800 Pure hypercholesterolemia, unspecified: Secondary | ICD-10-CM | POA: Diagnosis not present

## 2022-10-20 DIAGNOSIS — I10 Essential (primary) hypertension: Secondary | ICD-10-CM | POA: Diagnosis present

## 2022-10-20 DIAGNOSIS — N1832 Chronic kidney disease, stage 3b: Secondary | ICD-10-CM | POA: Diagnosis not present

## 2022-10-20 MED ORDER — FUROSEMIDE 20 MG PO TABS: ORAL_TABLET | ORAL | Status: DC

## 2022-10-20 NOTE — Progress Notes (Signed)
Patient ID: Karen [name], female   DOB: [birth year], 89 y.o.   MRN: [id number]    Cardiology Office Note    Date:  10/24/2022   ID:  Karen [name], DOB 10-24-1928, MRN [id number]  PCP:  Irena Reichmann, DO  Cardiologist:   Thurmon Fair, MD   No chief complaint on file.   History of Present Illness:  Karen [name] is a 89 y.o. female with coronary artery disease (2015 cath with 30-40% distal left main stenosis, 40% proximal AV stenosis, 90% mid and distal LAD stenosis, 70-90% mid-distal left circumflex stenosis, 40-50% mid RCA stenosis; status post PCI-DES to LAD and circumflex), history of chronic diastolic heart failure (improved after treatment of coronary disease), hypertension and an ascending aortic aneurysm last measured  (June 2019) at 4.7 cm in maximum diameter.  She is doing quite well.  She has not had any chest pain in a long time.  She has not taken any nitroglycerin since May 2023.  She denies dyspnea with her usual activities.  Does not have orthopnea, PND, edema, palpitations, dizziness or syncope.  Metabolic parameters are mostly stable.  The exception is that her creatinine is 2.24, this is a slight increase.  Takes a very low-dose of furosemide 20 mg daily.  Electrolytes and liver function tests are normal.  Her most recent lipid profile showed an LDL cholesterol of 66.  Hemoglobin A1c was recently 7.9%.  Her TSH is mildly elevated at 6.4 with a normal free T4 of 0.97.  She has chronic mild microcytic anemia with a hemoglobin most recently 10.9  On October 11 2013 she had 2.25 x 20 mm Promus to 90% L,AD- mid to distal and 3.5 x 12 mm Promus to 80% LCx-mid lesions. She had presented with profound fatigue and exertional dyspnea, but without angina. Symptoms did not resolve despite aggressive antiHTNive and antianginal pharmacological therapy. LVEF was 50%. Symptoms improved rapidly after placement of the stents. She has treated HTN and hyperlipidemia and mild DM.   She  had a repeat hospitalization in late March 2023 with dyspnea and orthopnea.  VQ scan was negative for pulmonary embolism.  High-sensitivity troponin was mildly elevated, peaking at 192.  Echo showed LVEF decreased to 50-55% with distal septal hypokinesis and she improved with IV diuretics.  Cardiac catheterization was considered, but the decision was made to defer this because of her renal dysfunction.  She was placed on dual antiplatelet therapy with aspirin and clopidogrel.    We have subsequently obtained a PET scan performed on 06/30/2021.  This does not show any regional perfusion deficits, but does show low myocardial blood flow reserve and overall diminished maximum myocardial blood flow with stress (only 1.8 mL/g/minute).  This raises a concern for multivessel coronary artery disease or left main coronary disease.  It should be noted that EF is better, 66% at rest, 68% at stress.  She did very poorly with attempted transition from metoprolol to carvedilol, which caused diarrhea tremulousness and what sounds like rebound hypertension and rebound arrhythmia.       Past Medical History:  Diagnosis Date   Arthritis    Ascending aortic aneurysm (HCC)    Chronic diastolic CHF (congestive heart failure) (HCC)    Constipation    Coronary artery disease 04/2013   Three-vessel disease, initial medical therapy then stenting of the LAD and CFX 10/2013   Diabetes mellitus    TYPE 2   Fibromyalgia    H/O: gout    Heart murmur  Hx: UTI (urinary tract infection)    Hypertension     Past Surgical History:  Procedure Laterality Date   APPENDECTOMY  1951   BACK SURGERY  1972   CARDIAC CATHETERIZATION  04/2013   Left main 30-40%, prox LAD 40%, mid/distal LAD 90%, CFX 70%, OM 3 90%, RCA 40-50%, PDA 20-80%, EF 50%    CHOLECYSTECTOMY  1951   Closed reduction of Left Total Hip  2008   x 3   CORONARY STENT PLACEMENT  10/11/2013   2.25 x 20 mm Promus DES to mid/distal LAD & 3.5 x 12 mm Promus  DES Mid CX         DR [place]   HERNIA REPAIR  1963   JOINT REPLACEMENT  left hip-2007, left knee-2000, right knee-1997   Left Hip, Bilateral Knee replacements   KNEE SURGERY  1997/2000   LEFT HEART CATHETERIZATION WITH CORONARY ANGIOGRAM N/A 04/25/2013   Procedure: LEFT HEART CATHETERIZATION WITH CORONARY ANGIOGRAM;  Surgeon: Thurmon Fair, MD;  Location: [facility] CATH LAB;  Service: Cardiovascular;  Laterality: N/A;   PERCUTANEOUS CORONARY STENT INTERVENTION (PCI-S) N/A 10/11/2013   Procedure: PERCUTANEOUS CORONARY STENT INTERVENTION (PCI-S);  Surgeon: Peter M [place], MD;  Location: [facility] CATH LAB;  Service: Cardiovascular;  Laterality: N/A;   REPLACEMENT TOTAL HIP W/  RESURFACING IMPLANTS  [id number]   Right Foot Surgery  2004   x 2   Salivary Gland Stone Extraction     SPINAL FUSION  2006   TOTAL HIP ARTHROPLASTY Right 05/14/2020   Procedure: TOTAL HIP ARTHROPLASTY ANTERIOR APPROACH;  Surgeon: Ollen Gross, MD;  Location: [facility] ORS;  Service: Orthopedics;  Laterality: Right;    TOTAL HIP REVISION Left 04/08/2021   Procedure: TOTAL HIP REVISION;  Surgeon: Ollen Gross, MD;  Location: [facility] ORS;  Service: Orthopedics;  Laterality: Left;    Current Medications: Outpatient Medications Prior to Visit  Medication Sig Dispense Refill   acetaminophen (TYLENOL) 650 MG CR tablet Take 650 mg by mouth at bedtime as needed (arthritis pain).     amLODipine (NORVASC) 5 MG tablet Take 1 tablet (5 mg total) by mouth daily. 90 tablet 3   aspirin 81 MG chewable tablet Chew 1 tablet (81 mg total) by mouth daily.     atorvastatin (LIPITOR) 40 MG tablet Take 40 mg by mouth at bedtime.     Calcium Carbonate-Vitamin D 600-400 MG-UNIT tablet Take 1 tablet by mouth every morning.     Coenzyme Q10 (CO Q 10) 100 MG CAPS Take 300 mg by mouth every morning.     glipiZIDE (GLUCOTROL) 10 MG tablet Take 10 mg by mouth 2 (two) times daily.     Magnesium 250 MG TABS Take 250 mg by mouth every morning.     metFORMIN (GLUCOPHAGE)  1000 MG tablet Take 1 tablet (1,000 mg total) by mouth 2 (two) times daily with a meal. Hold for 2 days, restart on 10/14/2013 (Patient taking differently: Take 1,000 mg by mouth 2 (two) times daily with a meal.) 60 tablet 0   metoprolol succinate (TOPROL-XL) 100 MG 24 hr tablet TAKE 1 TABLET(100 MG) BY MOUTH EVERY MORNING WITH OR IMMEDIATELY FOLLOWING A MEAL 90 tablet 3   mirtazapine (REMERON) 15 MG tablet Take 15 mg by mouth at bedtime.     Multiple Vitamin (MULTIVITAMIN WITH MINERALS) TABS tablet Take 1 tablet by mouth every morning.     Multiple Vitamins-Minerals (PRESERVISION AREDS 2) CAPS Take 1 capsule by mouth 2 (two) times daily.  Polyvinyl Alcohol-Povidone (REFRESH OP) 1 drop by Used in bone cement route 3 (three) times daily.     potassium chloride SA (K-DUR,KLOR-CON) 20 MEQ tablet Take 1 tablet (20 mEq total) by mouth 2 (two) times daily. (Patient taking differently: Take 20 mEq by mouth 2 (two) times daily after a meal.) 180 tablet 3   QUEtiapine (SEROQUEL) 200 MG tablet Take 200 mg by mouth at bedtime.     valsartan (DIOVAN) 320 MG tablet Take 1 tablet (320 mg total) by mouth daily. (Patient taking differently: Take 320 mg by mouth every morning.) 90 tablet 3   furosemide (LASIX) 20 MG tablet Take 1 tablet (20 mg total) by mouth daily. 30 tablet 5   docusate sodium (COLACE) 100 MG capsule Take 100 mg by mouth as needed. (Patient not taking: Reported on 10/20/2022)     methocarbamol (ROBAXIN) 500 MG tablet methocarbamol 500 mg tablet  TAKE 1 TABLET BY MOUTH FOUR TIMES DAILY AS DIRECTED (Patient not taking: Reported on 10/20/2022)     nitroGLYCERIN (NITROSTAT) 0.4 MG SL tablet Place 1 tablet (0.4 mg total) under the tongue every 5 (five) minutes as needed for chest pain (or SOB). (Patient not taking: Reported on 10/20/2022) 15 tablet 12   Polyethyl Glycol-Propyl Glycol (SYSTANE ULTRA) 0.4-0.3 % SOLN Place 1 drop into both eyes daily as needed (dry eyes). (Patient not taking: Reported on  10/20/2022)     No facility-administered medications prior to visit.     Allergies:   Lisinopril; Pneumococcal vaccines; Antihistamines, diphenhydramine-type; Carvedilol; Codeine; Morphine and codeine; and Ultram [tramadol hcl]   Social History   Socioeconomic History   Marital status: Widowed    Spouse name: Not on file   Number of children: Not on file   Years of education: Not on file   Highest education level: Not on file  Occupational History   Not on file  Tobacco Use   Smoking status: Never   Smokeless tobacco: Never  Vaping Use   Vaping status: Never Used  Substance and Sexual Activity   Alcohol use: No   Drug use: No   Sexual activity: Never  Other Topics Concern   Not on file  Social History Narrative   Not on file   Social Determinants of Health   Financial Resource Strain: Not on file  Food Insecurity: Not on file  Transportation Needs: No Transportation Needs (11/30/2021)   PRAPARE - Administrator, Civil Service (Medical): No    Lack of Transportation (Non-Medical): No  Physical Activity: Not on file  Stress: Not on file  Social Connections: Not on file     Family History:  The patient's family history includes Cancer in her brother and mother; Heart attack in her brother and father; Stroke in her father.   ROS:   Please see the history of present illness.    All other systems are reviewed and are negative.   PHYSICAL EXAM:   VS:  BP 130/70   Pulse 80   Ht 5\' 4"  (1.626 m)   Wt 135 lb 9.6 oz (61.5 kg)   LMP 05/10/1998   SpO2 97%   BMI 23.28 kg/m     Repeat BP 180/92   General: Alert, oriented x3, no distress, appears well Head: no evidence of trauma, PERRL, EOMI, no exophtalmos or lid lag, no myxedema, no xanthelasma; normal ears, nose and oropharynx Neck: normal jugular venous pulsations and no hepatojugular reflux; brisk carotid pulses without delay and no carotid bruits Chest:  clear to auscultation, no signs of consolidation  by percussion or palpation, normal fremitus, symmetrical and full respiratory excursions Cardiovascular: normal position and quality of the apical impulse, regular rhythm, normal first and second heart sounds, no murmurs, rubs or gallops Abdomen: no tenderness or distention, no masses by palpation, no abnormal pulsatility or arterial bruits, normal bowel sounds, no hepatosplenomegaly Extremities: no clubbing, cyanosis or edema; 2+ radial, ulnar and brachial pulses bilaterally; 2+ right femoral, posterior tibial and dorsalis pedis pulses; 2+ left femoral, posterior tibial and dorsalis pedis pulses; no subclavian or femoral bruits Neurological: grossly nonfocal Psych: Normal mood and affect      Wt Readings from Last 3 Encounters:  10/20/22 135 lb 9.6 oz (61.5 kg)  04/27/22 126 lb 9.6 oz (57.4 kg)  10/06/21 123 lb 6.4 oz (56 kg)      Studies/Labs Reviewed:   EKG:  EKG is ordered today and shows normal sinus rhythm, normal tracing with exception of very mild IVCD (QRS duration 110 ms)  PET scan 06/30/2021   LV perfusion is normal. There is no evidence of ischemia. There is no evidence of infarction. TID was present (1.39) and confirmed visually. EF dropped with stress (68%->66%). Myocardial blood flow reserve abnormal (1.22) but this was influenced by high rest flow due to elevated blood pressure (1.50 ml/min/g). Regardless, stress myocardial blood flow abnormal (1.8 ml/min/g). Overall, findings are concerning for left main or 3-vessel CAD due to drop in EF and TID despite normal perfusion imaging. MBF abnormal as well. Would consider invasive angiography for clarification.   Rest left ventricular function is normal. Rest EF: 68 %. Stress left ventricular function is normal. Stress EF: 66 %. End diastolic cavity size is normal.   Myocardial blood flow was computed to be 1.27ml/g/min at rest and 1.70ml/g/min at stress. Global myocardial blood flow reserve was 1.22 and was abnormal.   Coronary  calcium assessment not performed due to prior revascularization.   The study is normal. The study is high risk.   Electronically signed by Lennie Odor, MD     Echocardiogram 04/27/2021   1. Distal septal hypokinesis . Left ventricular ejection fraction, by  estimation, is 50 to 55%. The left ventricle has low normal function. The left ventricle demonstrates regional wall motion abnormalities (see scoring diagram/findings for  description). There is mild asymmetric left ventricular hypertrophy of the basal and septal segments. Left ventricular diastolic parameters are indeterminate.   2. Right ventricular systolic function is normal. The right ventricular size is normal.   3. Left atrial size was mildly dilated.   4. The mitral valve is degenerative. Trivial mitral valve regurgitation. No evidence of mitral stenosis. Moderate mitral annular calcification.   5. The aortic valve is tricuspid. There is moderate calcification of the aortic valve. There is moderate thickening of the aortic valve. Aortic valve regurgitation is trivial. Aortic valve sclerosis/calcification is present, without any evidence of  aortic stenosis.   6. The inferior vena cava is normal in size with greater than 50%  respiratory variability, suggesting right atrial pressure of 3 mmHg.    Recent Labs:     Latest Ref Rng & Units 05/15/2021    3:52 PM 04/26/2021    3:13 AM 04/25/2021   10:57 PM  BMP  Glucose 70 - 99 mg/dL 409  811  914   BUN 10 - 36 mg/dL 31  36  39   Creatinine 0.57 - 1.00 mg/dL 7.82  9.56  2.13   BUN/Creat Ratio 12 -  28 18     Sodium 134 - 144 mmol/L 137  132  133   Potassium 3.5 - 5.2 mmol/L 5.1  4.9  4.7   Chloride 96 - 106 mmol/L 102  104  101   CO2 20 - 29 mmol/L 19  17  20    Calcium 8.7 - 10.3 mg/dL 9.6  8.5  9.1     40/98/1191 Potassium 4.8, creatinine 1.65, glucose 280, normal liver function tests Hemoglobin 10.3, MCV 107, hemoglobin A1c 8.1%  10/19/2022 Creatinine 2.24, potassium 4.8,  normal liver function test Hemoglobin A1c 7.9% TSH 6.4, free T40.97 Hemoglobin 10.9, MCV 105  Lipid Panel  No results found for: "CHOL", "TRIG", "HDL", "CHOLHDL", "VLDL", "LDLCALC", "LDLDIRECT", "LABVLDL"  06/16/2021 Cholesterol 146, triglycerides 166, HDL 47, LDL 71  10/19/2022 Cholesterol 128, triglycerides 103, HDL 43, LDL 66  ASSESSMENT:    1. Chronic diastolic CHF (congestive heart failure) (HCC)   2. Coronary artery disease involving native coronary artery of native heart without angina pectoris   3. Aneurysm of ascending aorta without rupture (HCC)   4. Essential hypertension   5. Hypercholesterolemia   6. Type 2 diabetes mellitus with complication, without long-term current use of insulin (HCC)   7. Stage 3b chronic kidney disease (HCC)         PLAN:  In order of problems listed above: CHF: NYHA functional class I, clinically euvolemic.  Avoid excessive diuretic use due to renal dysfunction.  Would like to reduce her diuretic to 10 mg on Mondays, Wednesdays and Fridays only. CAD: She has never had angina, presented with shortness of breath.  The current PET findings are concerning for recurrent multivessel CAD, although no distinct single area of abnormal perfusion is seen.  Unlikely that she would have anatomy amenable to percutaneous revascularization and she is not a candidate for surgical bypass.  Previous cardiac catheterization could not be performed via the radial artery and then she had a serious groin hemorrhage with her previous procedure.  In addition she has significant renal dysfunction.  Should exhaust attempts at medical therapy before we plan another coronary intervention. TAAA: Asymptomatic and stable in size on serial studies most recently estimated at 4.8 cm on 08/05/2020.  She has substantial aortic atherosclerosis throughout the entire thoracic aorta.  The distal descending thoracic aorta is also aneurysmal with a maximum diameter 4.3 cm.  She is now 89  years old and has worsened renal dysfunction.  It is unlikely she would be a good candidate for preventive surgery.  Performing routine imaging follow-up may cause more harm than benefit and I would recommend pursuing these only if she becomes symptomatic. HTN: Today her blood pressure is quite high, but this is an isolated finding.  She is not taking NSAIDs and has taken her usual medication.  Would like to avoid thiazide diuretics due to renal dysfunction.  She is on maximum dose of ARB.  Prefer use of beta-blockers due to CAD and aortic aneurysm, but note that she was severely intolerant of carvedilol.  Asked her to keep her attention to her blood pressure at home and we might start amlodipine. HLP: Lipid parameters are all at target on statin (LDL under 70).  Continue statin. DM: Improved control, now within target range (<8%) CKD 3B: Most recent creatinine has deteriorated to a level that would actually make her CKD stage IV.  Monitor closely and reduce the furosemide to 20 mg 3 days a week only.   Elevated blood pressure noted today, but  was an isolated finding.  She will check her blood pressure at home and we will add more medications if this is a consistent finding.  Medication Adjustments/Labs and Tests Ordered: Current medicines are reviewed at length with the patient today.  Concerns regarding medicines are outlined above.  Medication changes, Labs and Tests ordered today are listed in the Patient Instructions below. Patient Instructions  Medication Instructions:  Take 20 mg Lasix on Monday, Wednesday, and Friday *If you need a refill on your cardiac medications before your next appointment, please call your pharmacy*  Follow-Up: At [facility], you and your health needs are our priority.  As part of our continuing mission to provide you with exceptional heart care, we have created designated Provider Care Teams.  These Care Teams include your primary Cardiologist (physician)  and Advanced Practice Providers (APPs -  Physician Assistants and Nurse Practitioners) who all work together to provide you with the care you need, when you need it.  We recommend signing up for the patient portal called "MyChart".  Sign up information is provided on this After Visit Summary.  MyChart is used to connect with patients for Virtual Visits (Telemedicine).  Patients are able to view lab/test results, encounter notes, upcoming appointments, etc.  Non-urgent messages can be sent to your provider as well.   To learn more about what you can do with MyChart, go to [email or web address].    Your next appointment:   1 year(s)  Provider:   Thurmon Fair, MD       Signed, Thurmon Fair, MD  10/24/2022 4:30 PM    [facility] [address] [place], [place], [place]  [address] Phone: [phone]; Fax: [phone]

## 2022-10-20 NOTE — Patient Instructions (Signed)
Medication Instructions:  Take 20 mg Lasix on Monday, Wednesday, and Friday *If you need a refill on your cardiac medications before your next appointment, please call your pharmacy*  Follow-Up: At [facility], you and your health needs are our priority.  As part of our continuing mission to provide you with exceptional heart care, we have created designated Provider Care Teams.  These Care Teams include your primary Cardiologist (physician) and Advanced Practice Providers (APPs -  Physician Assistants and Nurse Practitioners) who all work together to provide you with the care you need, when you need it.  We recommend signing up for the patient portal called "MyChart".  Sign up information is provided on this After Visit Summary.  MyChart is used to connect with patients for Virtual Visits (Telemedicine).  Patients are able to view lab/test results, encounter notes, upcoming appointments, etc.  Non-urgent messages can be sent to your provider as well.   To learn more about what you can do with MyChart, go to [email or web address].    Your next appointment:   1 year(s)  Provider:   Thurmon Fair, MD

## 2022-10-26 DIAGNOSIS — G4700 Insomnia, unspecified: Secondary | ICD-10-CM | POA: Diagnosis not present

## 2022-10-26 DIAGNOSIS — Z23 Encounter for immunization: Secondary | ICD-10-CM | POA: Diagnosis not present

## 2022-10-26 DIAGNOSIS — E1122 Type 2 diabetes mellitus with diabetic chronic kidney disease: Secondary | ICD-10-CM | POA: Diagnosis not present

## 2022-10-26 DIAGNOSIS — I2510 Atherosclerotic heart disease of native coronary artery without angina pectoris: Secondary | ICD-10-CM | POA: Diagnosis not present

## 2022-10-26 DIAGNOSIS — E039 Hypothyroidism, unspecified: Secondary | ICD-10-CM | POA: Diagnosis not present

## 2022-10-26 DIAGNOSIS — N184 Chronic kidney disease, stage 4 (severe): Secondary | ICD-10-CM | POA: Diagnosis not present

## 2022-10-26 DIAGNOSIS — E7800 Pure hypercholesterolemia, unspecified: Secondary | ICD-10-CM | POA: Diagnosis not present

## 2022-10-26 DIAGNOSIS — M1990 Unspecified osteoarthritis, unspecified site: Secondary | ICD-10-CM | POA: Diagnosis not present

## 2022-10-26 DIAGNOSIS — I130 Hypertensive heart and chronic kidney disease with heart failure and stage 1 through stage 4 chronic kidney disease, or unspecified chronic kidney disease: Secondary | ICD-10-CM | POA: Diagnosis not present

## 2022-10-26 DIAGNOSIS — D649 Anemia, unspecified: Secondary | ICD-10-CM | POA: Diagnosis not present

## 2023-01-20 ENCOUNTER — Inpatient Hospital Stay (HOSPITAL_COMMUNITY)
Admission: EM | Admit: 2023-01-20 | Discharge: 2023-01-26 | DRG: 280 | Disposition: A | Discharge status: Hospice | Payer: Medicare Other | Attending: Internal Medicine | Admitting: Internal Medicine

## 2023-01-20 ENCOUNTER — Other Ambulatory Visit: Payer: Self-pay

## 2023-01-20 ENCOUNTER — Telehealth: Payer: Self-pay | Admitting: Cardiovascular Disease

## 2023-01-20 ENCOUNTER — Emergency Department (HOSPITAL_COMMUNITY): Payer: Medicare Other

## 2023-01-20 ENCOUNTER — Encounter (HOSPITAL_COMMUNITY): Payer: Self-pay

## 2023-01-20 DIAGNOSIS — Z6822 Body mass index (BMI) 22.0-22.9, adult: Secondary | ICD-10-CM

## 2023-01-20 DIAGNOSIS — E871 Hypo-osmolality and hyponatremia: Secondary | ICD-10-CM | POA: Insufficient documentation

## 2023-01-20 DIAGNOSIS — I5022 Chronic systolic (congestive) heart failure: Secondary | ICD-10-CM | POA: Insufficient documentation

## 2023-01-20 DIAGNOSIS — E875 Hyperkalemia: Secondary | ICD-10-CM | POA: Diagnosis present

## 2023-01-20 DIAGNOSIS — Z96653 Presence of artificial knee joint, bilateral: Secondary | ICD-10-CM | POA: Diagnosis present

## 2023-01-20 DIAGNOSIS — I7121 Aneurysm of the ascending aorta, without rupture: Secondary | ICD-10-CM | POA: Diagnosis present

## 2023-01-20 DIAGNOSIS — Z955 Presence of coronary angioplasty implant and graft: Secondary | ICD-10-CM | POA: Diagnosis not present

## 2023-01-20 DIAGNOSIS — E1122 Type 2 diabetes mellitus with diabetic chronic kidney disease: Secondary | ICD-10-CM | POA: Diagnosis present

## 2023-01-20 DIAGNOSIS — M797 Fibromyalgia: Secondary | ICD-10-CM | POA: Diagnosis present

## 2023-01-20 DIAGNOSIS — Z66 Do not resuscitate: Secondary | ICD-10-CM | POA: Diagnosis not present

## 2023-01-20 DIAGNOSIS — K219 Gastro-esophageal reflux disease without esophagitis: Secondary | ICD-10-CM | POA: Insufficient documentation

## 2023-01-20 DIAGNOSIS — E1165 Type 2 diabetes mellitus with hyperglycemia: Secondary | ICD-10-CM | POA: Diagnosis not present

## 2023-01-20 DIAGNOSIS — N184 Chronic kidney disease, stage 4 (severe): Secondary | ICD-10-CM | POA: Diagnosis not present

## 2023-01-20 DIAGNOSIS — Z885 Allergy status to narcotic agent status: Secondary | ICD-10-CM

## 2023-01-20 DIAGNOSIS — D696 Thrombocytopenia, unspecified: Secondary | ICD-10-CM | POA: Diagnosis present

## 2023-01-20 DIAGNOSIS — I5043 Acute on chronic combined systolic (congestive) and diastolic (congestive) heart failure: Secondary | ICD-10-CM | POA: Diagnosis not present

## 2023-01-20 DIAGNOSIS — R0602 Shortness of breath: Secondary | ICD-10-CM | POA: Diagnosis not present

## 2023-01-20 DIAGNOSIS — N179 Acute kidney failure, unspecified: Secondary | ICD-10-CM | POA: Diagnosis not present

## 2023-01-20 DIAGNOSIS — Z79899 Other long term (current) drug therapy: Secondary | ICD-10-CM

## 2023-01-20 DIAGNOSIS — I5033 Acute on chronic diastolic (congestive) heart failure: Secondary | ICD-10-CM | POA: Diagnosis not present

## 2023-01-20 DIAGNOSIS — I2510 Atherosclerotic heart disease of native coronary artery without angina pectoris: Secondary | ICD-10-CM | POA: Diagnosis present

## 2023-01-20 DIAGNOSIS — I509 Heart failure, unspecified: Secondary | ICD-10-CM | POA: Diagnosis not present

## 2023-01-20 DIAGNOSIS — G4700 Insomnia, unspecified: Secondary | ICD-10-CM | POA: Diagnosis present

## 2023-01-20 DIAGNOSIS — I959 Hypotension, unspecified: Secondary | ICD-10-CM | POA: Diagnosis not present

## 2023-01-20 DIAGNOSIS — Z794 Long term (current) use of insulin: Secondary | ICD-10-CM

## 2023-01-20 DIAGNOSIS — R001 Bradycardia, unspecified: Secondary | ICD-10-CM | POA: Diagnosis not present

## 2023-01-20 DIAGNOSIS — I25110 Atherosclerotic heart disease of native coronary artery with unstable angina pectoris: Secondary | ICD-10-CM | POA: Diagnosis not present

## 2023-01-20 DIAGNOSIS — D649 Anemia, unspecified: Secondary | ICD-10-CM | POA: Diagnosis not present

## 2023-01-20 DIAGNOSIS — Z888 Allergy status to other drugs, medicaments and biological substances status: Secondary | ICD-10-CM

## 2023-01-20 DIAGNOSIS — E876 Hypokalemia: Secondary | ICD-10-CM | POA: Diagnosis not present

## 2023-01-20 DIAGNOSIS — N281 Cyst of kidney, acquired: Secondary | ICD-10-CM | POA: Diagnosis not present

## 2023-01-20 DIAGNOSIS — Z7189 Other specified counseling: Secondary | ICD-10-CM | POA: Diagnosis not present

## 2023-01-20 DIAGNOSIS — I130 Hypertensive heart and chronic kidney disease with heart failure and stage 1 through stage 4 chronic kidney disease, or unspecified chronic kidney disease: Secondary | ICD-10-CM | POA: Diagnosis present

## 2023-01-20 DIAGNOSIS — E1169 Type 2 diabetes mellitus with other specified complication: Secondary | ICD-10-CM | POA: Diagnosis not present

## 2023-01-20 DIAGNOSIS — I252 Old myocardial infarction: Secondary | ICD-10-CM | POA: Diagnosis not present

## 2023-01-20 DIAGNOSIS — I700 Atherosclerosis of aorta: Secondary | ICD-10-CM | POA: Diagnosis not present

## 2023-01-20 DIAGNOSIS — M1990 Unspecified osteoarthritis, unspecified site: Secondary | ICD-10-CM | POA: Diagnosis not present

## 2023-01-20 DIAGNOSIS — Z7984 Long term (current) use of oral hypoglycemic drugs: Secondary | ICD-10-CM

## 2023-01-20 DIAGNOSIS — I214 Non-ST elevation (NSTEMI) myocardial infarction: Secondary | ICD-10-CM | POA: Diagnosis not present

## 2023-01-20 DIAGNOSIS — K220 Achalasia of cardia: Secondary | ICD-10-CM | POA: Diagnosis present

## 2023-01-20 DIAGNOSIS — Z8249 Family history of ischemic heart disease and other diseases of the circulatory system: Secondary | ICD-10-CM

## 2023-01-20 DIAGNOSIS — E785 Hyperlipidemia, unspecified: Secondary | ICD-10-CM | POA: Diagnosis not present

## 2023-01-20 DIAGNOSIS — R7989 Other specified abnormal findings of blood chemistry: Secondary | ICD-10-CM | POA: Diagnosis present

## 2023-01-20 DIAGNOSIS — R1310 Dysphagia, unspecified: Secondary | ICD-10-CM | POA: Diagnosis present

## 2023-01-20 DIAGNOSIS — I10 Essential (primary) hypertension: Secondary | ICD-10-CM | POA: Diagnosis present

## 2023-01-20 DIAGNOSIS — Z96643 Presence of artificial hip joint, bilateral: Secondary | ICD-10-CM | POA: Diagnosis present

## 2023-01-20 DIAGNOSIS — R54 Age-related physical debility: Secondary | ICD-10-CM | POA: Diagnosis present

## 2023-01-20 DIAGNOSIS — E119 Type 2 diabetes mellitus without complications: Secondary | ICD-10-CM

## 2023-01-20 DIAGNOSIS — D539 Nutritional anemia, unspecified: Secondary | ICD-10-CM | POA: Diagnosis present

## 2023-01-20 DIAGNOSIS — K5900 Constipation, unspecified: Secondary | ICD-10-CM | POA: Diagnosis present

## 2023-01-20 DIAGNOSIS — Z515 Encounter for palliative care: Secondary | ICD-10-CM | POA: Diagnosis not present

## 2023-01-20 DIAGNOSIS — Z981 Arthrodesis status: Secondary | ICD-10-CM

## 2023-01-20 DIAGNOSIS — Z823 Family history of stroke: Secondary | ICD-10-CM

## 2023-01-20 DIAGNOSIS — I447 Left bundle-branch block, unspecified: Secondary | ICD-10-CM | POA: Diagnosis present

## 2023-01-20 DIAGNOSIS — N170 Acute kidney failure with tubular necrosis: Secondary | ICD-10-CM | POA: Diagnosis not present

## 2023-01-20 DIAGNOSIS — Z7982 Long term (current) use of aspirin: Secondary | ICD-10-CM

## 2023-01-20 DIAGNOSIS — Z887 Allergy status to serum and vaccine status: Secondary | ICD-10-CM

## 2023-01-20 DIAGNOSIS — I110 Hypertensive heart disease with heart failure: Secondary | ICD-10-CM | POA: Diagnosis not present

## 2023-01-20 DIAGNOSIS — M109 Gout, unspecified: Secondary | ICD-10-CM | POA: Diagnosis present

## 2023-01-20 DIAGNOSIS — Z87440 Personal history of urinary (tract) infections: Secondary | ICD-10-CM

## 2023-01-20 DIAGNOSIS — I5032 Chronic diastolic (congestive) heart failure: Secondary | ICD-10-CM | POA: Diagnosis not present

## 2023-01-20 DIAGNOSIS — R778 Other specified abnormalities of plasma proteins: Secondary | ICD-10-CM | POA: Diagnosis not present

## 2023-01-20 DIAGNOSIS — I712 Thoracic aortic aneurysm, without rupture, unspecified: Secondary | ICD-10-CM | POA: Diagnosis not present

## 2023-01-20 LAB — CBC WITH DIFFERENTIAL/PLATELET
Abs Immature Granulocytes: 0.02 10*3/uL (ref 0.00–0.07)
Basophils Absolute: 0 10*3/uL (ref 0.0–0.1)
Basophils Relative: 0 %
Eosinophils Absolute: 0 10*3/uL (ref 0.0–0.5)
Eosinophils Relative: 0 %
HCT: 32.1 % — ABNORMAL LOW (ref 36.0–46.0)
Hemoglobin: 10.4 g/dL — ABNORMAL LOW (ref 12.0–15.0)
Immature Granulocytes: 0 %
Lymphocytes Relative: 10 %
Lymphs Abs: 0.8 10*3/uL (ref 0.7–4.0)
MCH: 35.5 pg — ABNORMAL HIGH (ref 26.0–34.0)
MCHC: 32.4 g/dL (ref 30.0–36.0)
MCV: 109.6 fL — ABNORMAL HIGH (ref 80.0–100.0)
Monocytes Absolute: 0.6 10*3/uL (ref 0.1–1.0)
Monocytes Relative: 7 %
Neutro Abs: 6.6 10*3/uL (ref 1.7–7.7)
Neutrophils Relative %: 83 %
Platelets: 115 10*3/uL — ABNORMAL LOW (ref 150–400)
RBC: 2.93 MIL/uL — ABNORMAL LOW (ref 3.87–5.11)
RDW: 13 % (ref 11.5–15.5)
WBC: 8.1 10*3/uL (ref 4.0–10.5)
nRBC: 0 % (ref 0.0–0.2)

## 2023-01-20 LAB — TROPONIN I (HIGH SENSITIVITY)
Troponin I (High Sensitivity): 481 ng/L (ref <18)
Troponin I (High Sensitivity): 838 ng/L (ref <18)

## 2023-01-20 LAB — BRAIN NATRIURETIC PEPTIDE: B Natriuretic Peptide: 1734.4 pg/mL — ABNORMAL HIGH (ref 0.0–100.0)

## 2023-01-20 LAB — BASIC METABOLIC PANEL
Anion gap: 14 (ref 5–15)
BUN: 67 mg/dL — ABNORMAL HIGH (ref 8–23)
CO2: 17 mmol/L — ABNORMAL LOW (ref 22–32)
Calcium: 9.8 mg/dL (ref 8.9–10.3)
Chloride: 101 mmol/L (ref 98–111)
Creatinine, Ser: 2.79 mg/dL — ABNORMAL HIGH (ref 0.44–1.00)
GFR, Estimated: 15 mL/min — ABNORMAL LOW (ref >60)
Glucose, Bld: 374 mg/dL — ABNORMAL HIGH (ref 70–99)
Potassium: 5.4 mmol/L — ABNORMAL HIGH (ref 3.5–5.1)
Sodium: 132 mmol/L — ABNORMAL LOW (ref 135–145)

## 2023-01-20 MED ORDER — INSULIN ASPART 100 UNIT/ML IJ SOLN
0.0000 [IU] | Freq: Every day | INTRAMUSCULAR | Status: DC
  Administered 2023-01-20 – 2023-01-24 (×3): 2 [IU] via SUBCUTANEOUS

## 2023-01-20 MED ORDER — INSULIN ASPART 100 UNIT/ML IJ SOLN
0.0000 [IU] | Freq: Three times a day (TID) | INTRAMUSCULAR | Status: DC
Start: 2023-01-21 — End: 2023-01-25
  Administered 2023-01-21 (×3): 3 [IU] via SUBCUTANEOUS
  Administered 2023-01-22: 1 [IU] via SUBCUTANEOUS
  Administered 2023-01-22: 2 [IU] via SUBCUTANEOUS
  Administered 2023-01-22 – 2023-01-23 (×2): 3 [IU] via SUBCUTANEOUS
  Administered 2023-01-23: 1 [IU] via SUBCUTANEOUS
  Administered 2023-01-23 – 2023-01-24 (×2): 2 [IU] via SUBCUTANEOUS
  Administered 2023-01-24 – 2023-01-25 (×3): 3 [IU] via SUBCUTANEOUS

## 2023-01-20 MED ORDER — MELATONIN 3 MG PO TABS
3.0000 mg | ORAL_TABLET | Freq: Every evening | ORAL | Status: DC | PRN
Start: 2023-01-20 — End: 2023-01-26
  Administered 2023-01-21 – 2023-01-23 (×2): 3 mg via ORAL
  Filled 2023-01-20 (×3): qty 1

## 2023-01-20 MED ORDER — ASPIRIN 81 MG PO CHEW
324.0000 mg | CHEWABLE_TABLET | Freq: Once | ORAL | Status: AC
  Administered 2023-01-20: 324 mg via ORAL
  Filled 2023-01-20: qty 4

## 2023-01-20 MED ORDER — ONDANSETRON HCL 4 MG/2ML IJ SOLN
4.0000 mg | Freq: Four times a day (QID) | INTRAMUSCULAR | Status: DC | PRN
  Administered 2023-01-26: 4 mg via INTRAVENOUS
  Filled 2023-01-20: qty 2

## 2023-01-20 MED ORDER — NITROGLYCERIN 0.4 MG SL SUBL
0.4000 mg | SUBLINGUAL_TABLET | SUBLINGUAL | Status: DC | PRN
Start: 2023-01-20 — End: 2023-01-26

## 2023-01-20 MED ORDER — FUROSEMIDE 10 MG/ML IJ SOLN
80.0000 mg | Freq: Once | INTRAMUSCULAR | Status: AC
  Administered 2023-01-20: 80 mg via INTRAVENOUS
  Filled 2023-01-20: qty 8

## 2023-01-20 MED ORDER — ACETAMINOPHEN 325 MG PO TABS: 650.0000 mg | ORAL_TABLET | Freq: Four times a day (QID) | ORAL | Status: DC | PRN

## 2023-01-20 MED ORDER — HEPARIN BOLUS VIA INFUSION
4000.0000 [IU] | Freq: Once | INTRAVENOUS | Status: AC
Start: 2023-01-20 — End: 2023-01-20
  Administered 2023-01-20: 4000 [IU] via INTRAVENOUS
  Filled 2023-01-20: qty 4000

## 2023-01-20 MED ORDER — HEPARIN (PORCINE) 25000 UT/250ML-% IV SOLN
800.0000 [IU]/h | INTRAVENOUS | Status: DC
  Administered 2023-01-20 – 2023-01-23 (×3): 800 [IU]/h via INTRAVENOUS
  Filled 2023-01-20 (×3): qty 250

## 2023-01-20 MED ORDER — SODIUM ZIRCONIUM CYCLOSILICATE 10 G PO PACK
10.0000 g | PACK | Freq: Once | ORAL | Status: AC
  Administered 2023-01-21: 10 g via ORAL
  Filled 2023-01-20 (×2): qty 1

## 2023-01-20 MED ORDER — ACETAMINOPHEN 650 MG RE SUPP: 650.0000 mg | Freq: Four times a day (QID) | RECTAL | Status: DC | PRN

## 2023-01-20 NOTE — ED Provider Triage Note (Signed)
Emergency Medicine Provider Triage Evaluation Note  Karen [name] , a 89 y.o. female  was evaluated in triage.  Pt complains of chest pain, shortness of breath since Saturday.  Patient states that this is worse when lying down but denies any leg swelling.  Patient states history of MI and is concerned she might having another 1.  Patient also states she has aneurysm of her thoracic aorta.  Patient not normally on oxygen at home.  Chest pain does not radiate.  Patient denies productive cough.  Review of Systems  Positive:  Negative:   Physical Exam  BP (!) 140/80 (BP Location: Left Arm)   Pulse 89   Temp (!) 97.4 F (36.3 C) (Oral)   Resp 19   LMP 05/10/1998   SpO2 100%  Gen:   Awake, no distress   Resp:  Normal effort  MSK:   Moves extremities without difficulty  Other:  On nasal cannula in the room which is up from baseline, no bilateral pitting edema, lungs clear to auscultation bilaterally  Medical Decision Making  Medically screening exam initiated at 1:55 PM.  Appropriate orders placed.  Karen [name] was informed that the remainder of the evaluation will be completed by another provider, this initial triage assessment does not replace that evaluation, and the importance of remaining in the ED until their evaluation is complete.  Workup initiated, patient stable at this time   Remi Deter 01/20/23 1357

## 2023-01-20 NOTE — Telephone Encounter (Signed)
Reviewed chart, suspect ED will recommend admission and Cardiology evaluation based on labs.

## 2023-01-20 NOTE — ED Provider Notes (Addendum)
[place] [facility]MENT AT [facility] Provider Note   CSN: [id number] Arrival date & time: 01/20/23  1323     History  Chief Complaint  Patient presents with   Shortness of Breath    Karen [name] is a 89 y.o. female history of CHF, STEMI March 2023 on baby aspirin daily, total hip replacement, CKD, diabetes, anemia presented for chest pain, shortness of breath, leg swelling has been present since Saturday.  Patient states symptoms are worse when lying down.  Patient denies productive cough, fevers.  Feels that this is different than her STEMI.   Home Medications Prior to Admission medications   Medication Sig Start Date End Date Taking? Authorizing Provider  acetaminophen (TYLENOL) 650 MG CR tablet Take 650 mg by mouth at bedtime as needed (arthritis pain).    [provider]  amLODipine (NORVASC) 5 MG tablet Take 1 tablet (5 mg total) by mouth daily. 04/27/22   Corrin Parker, PA-C  aspirin 81 MG chewable tablet Chew 1 tablet (81 mg total) by mouth daily. 05/01/21   Marinda Elk, MD  atorvastatin (LIPITOR) 40 MG tablet Take 40 mg by mouth at bedtime.    [provider]  Calcium Carbonate-Vitamin D 600-400 MG-UNIT tablet Take 1 tablet by mouth every morning.    [provider]  Coenzyme Q10 (CO Q 10) 100 MG CAPS Take 300 mg by mouth every morning.    [provider]  furosemide (LASIX) 20 MG tablet Take 20 mg every Monday, Wednesday, and Friday 10/20/22   Croitoru, Mihai, MD  glipiZIDE (GLUCOTROL) 10 MG tablet Take 10 mg by mouth 2 (two) times daily.    [provider]  Magnesium 250 MG TABS Take 250 mg by mouth every morning.    [provider]  metFORMIN (GLUCOPHAGE) 1000 MG tablet Take 1 tablet (1,000 mg total) by mouth 2 (two) times daily with a meal. Hold for 2 days, restart on 10/14/2013 Patient taking differently: Take 1,000 mg by mouth 2 (two) times daily with a meal. 10/12/13   Barrett,  Joline Salt, PA-C  metoprolol succinate (TOPROL-XL) 100 MG 24 hr tablet TAKE 1 TABLET(100 MG) BY MOUTH EVERY MORNING WITH OR IMMEDIATELY FOLLOWING A MEAL 06/15/22   Marjie Skiff E, PA-C  mirtazapine (REMERON) 15 MG tablet Take 15 mg by mouth at bedtime.    [provider]  Multiple Vitamin (MULTIVITAMIN WITH MINERALS) TABS tablet Take 1 tablet by mouth every morning.    [provider]  Multiple Vitamins-Minerals (PRESERVISION AREDS 2) CAPS Take 1 capsule by mouth 2 (two) times daily.    [provider]  nitroGLYCERIN (NITROSTAT) 0.4 MG SL tablet Place 1 tablet (0.4 mg total) under the tongue every 5 (five) minutes as needed for chest pain (or SOB). Patient not taking: Reported on 10/20/2022 05/01/21   Marinda Elk, MD  Polyvinyl Alcohol-Povidone (REFRESH OP) 1 drop by Used in bone cement route 3 (three) times daily.    [provider]  potassium chloride SA (K-DUR,KLOR-CON) 20 MEQ tablet Take 1 tablet (20 mEq total) by mouth 2 (two) times daily. Patient taking differently: Take 20 mEq by mouth 2 (two) times daily after a meal. 12/18/13   Rosalio Macadamia, NP  QUEtiapine (SEROQUEL) 200 MG tablet Take 200 mg by mouth at bedtime.    [provider]  valsartan (DIOVAN) 320 MG tablet Take 1 tablet (320 mg total) by mouth daily. Patient taking differently: Take 320 mg by mouth  every morning. 09/13/13   Croitoru, Mihai, MD      Allergies    Lisinopril; Pneumococcal vaccines; Antihistamines, diphenhydramine-type; Carvedilol; Codeine; Morphine and codeine; and Ultram [tramadol hcl]    Review of Systems   Review of Systems  Respiratory:  Positive for shortness of breath.     Physical Exam Updated Vital Signs BP (!) 140/80 (BP Location: Left Arm)   Pulse 89   Temp (!) 97.4 F (36.3 C) (Oral)   Resp 19   Ht 5\' 4"  (1.626 m)   Wt 59.9 kg   LMP 05/10/1998   SpO2 100%   BMI 22.66 kg/m  Physical Exam Constitutional:      General: She is not in  acute distress. Neck:     Comments: No JVD Cardiovascular:     Rate and Rhythm: Normal rate and regular rhythm.     Pulses: Normal pulses.     Heart sounds: Normal heart sounds.  Pulmonary:     Effort: Pulmonary effort is normal.     Breath sounds: Decreased breath sounds present.  Abdominal:     Palpations: Abdomen is soft.     Tenderness: There is no abdominal tenderness. There is no guarding or rebound.  Musculoskeletal:        General: Normal range of motion.     Cervical back: Normal range of motion.     Comments: Bilateral 2+ pitting edema  Skin:    General: Skin is warm and dry.     Capillary Refill: Capillary refill takes less than 2 seconds.  Neurological:     Mental Status: She is alert and oriented to person, place, and time.  Psychiatric:        Mood and Affect: Mood normal.     ED Results / Procedures / Treatments   Labs (all labs ordered are listed, but only abnormal results are displayed) Labs Reviewed  BRAIN NATRIURETIC PEPTIDE - Abnormal; Notable for the following components:      Result Value   B Natriuretic Peptide 1,734.4 (*)    All other components within normal limits  CBC WITH DIFFERENTIAL/PLATELET - Abnormal; Notable for the following components:   RBC 2.93 (*)    Hemoglobin 10.4 (*)    HCT 32.1 (*)    MCV 109.6 (*)    MCH 35.5 (*)    Platelets 115 (*)    All other components within normal limits  BASIC METABOLIC PANEL - Abnormal; Notable for the following components:   Sodium 132 (*)    Potassium 5.4 (*)    CO2 17 (*)    Glucose, Bld 374 (*)    BUN 67 (*)    Creatinine, Ser 2.79 (*)    GFR, Estimated 15 (*)    All other components within normal limits  TROPONIN I (HIGH SENSITIVITY) - Abnormal; Notable for the following components:   Troponin I (High Sensitivity) 481 (*)    All other components within normal limits  HEPARIN LEVEL (UNFRACTIONATED)  TROPONIN I (HIGH SENSITIVITY)    EKG None  Radiology DG Chest 2 View Result Date:  01/20/2023 CLINICAL DATA:  Shortness of breath EXAM: CHEST - 2 VIEW COMPARISON:  04/27/2021 FINDINGS: Heart size within normal limits. Known thoracic aortic aneurysm with extensive atherosclerotic calcification. Stable mediastinal contours. No focal airspace consolidation, pleural effusion, or pneumothorax. Multilevel degenerative spondylosis of the thoracic spine. Degenerative changes of the left shoulder. IMPRESSION: 1. No active cardiopulmonary disease. 2. Known thoracic aortic aneurysm. Electronically Signed   By: Janyth Pupa  Plundo D.O.   On: 01/20/2023 15:17    Procedures .Critical Care  Performed by: Netta Corrigan, PA-C Authorized by: Netta Corrigan, PA-C   Critical care provider statement:    Critical care time (minutes):  30   Critical care time was exclusive of:  Separately billable procedures and treating other patients   Critical care was necessary to treat or prevent imminent or life-threatening deterioration of the following conditions:  Cardiac failure and circulatory failure   Critical care was time spent personally by me on the following activities:  Blood draw for specimens, development of treatment plan with patient or surrogate, discussions with consultants, evaluation of patient's response to treatment, examination of patient, obtaining history from patient or surrogate, review of old charts, re-evaluation of patient's condition, pulse oximetry, ordering and review of radiographic studies, ordering and review of laboratory studies and ordering and performing treatments and interventions   I assumed direction of critical care for this patient from another provider in my specialty: no     Care discussed with: accepting provider at another facility     Care discussed with comment:  Cardiology on-call     Medications Ordered in ED Medications  nitroGLYCERIN (NITROSTAT) SL tablet 0.4 mg (has no administration in time range)  heparin bolus via infusion 4,000 Units (has no  administration in time range)  heparin ADULT infusion 100 units/mL (25000 units/239mL) (has no administration in time range)  aspirin chewable tablet 324 mg (324 mg Oral Given 01/20/23 1549)    ED Course/ Medical Decision Making/ A&P             HEART Score: 8                    Medical Decision Making Amount and/or Complexity of Data Reviewed Labs: ordered. Radiology: ordered.  Risk OTC drugs. Prescription drug management.   Hagan Cedar Haff 89 y.o. presented today for chest pain. Working DDx that I considered at this time includes, but not limited to, ACS, GERD, pulmonary embolism, community-acquired pneumonia, aortic dissection, pneumothorax, underlying bony abnormality, anemia, thyrotoxicosis, esophageal rupture, CHF exacerbation, valvular disorder, myocarditis, pericarditis, endocarditis, pericardial effusion/cardiac tamponade, pulmonary edema, gastritis/PUD, esophagitis.  R/o Dx: Still waiting on labs  Review of prior external notes: 10/20/2022 progress Notes  Unique Tests and My Interpretation:  EKG: Rate, rhythm, axis, intervals all examined and without medically relevant abnormality. ST segments without concerns for elevations Troponin: 481 CXR: No acute findings CBC: Unremarkable BMP: Potassium 5.4, creatinine 2.79, BUN 67, GFR 15, AKI present BNP: 1734.4  Social Determinants of Health: none  Discussion with Independent Historian:  Son  Discussion of Management of Tests:  Bjorn Pippin, MD Cardiology  Risk: High: hospitalization or escalation of hospital-level care  Risk Stratification Score: HEART Score: 8  Staffed with Zammit, MD  Plan: On exam patient was having active chest pain with stable vitals.  Troponin from triage was elevated at 481 and patient was having active chest pain.  Patient does look fluid overloaded as well which the BNP does confirm.  Patient also does have AKI as well.  EKG from triage reassuring however given the troponin and it being an  hour since last one a repeat EKG was ordered with some V1 ST elevation with no reciprocal changes or ST elevation contiguous leads.  I spoke to the cardiologist on-call who recommends ED to ED transfer for them to evaluate.  Dr[facility] Karene Fry at [facility] is accepting.  Secretary was notified of patient will be  transferred.  Patient given nitro sublingual for her chest pain along with full dose aspirin.  At time of transfer patient was stable.  This chart was dictated using voice recognition software.  Despite best efforts to proofread,  errors can occur which can change the documentation meaning.         Final Clinical Impression(s) / ED Diagnoses Final diagnoses:  AKI (acute kidney injury) (HCC)  Congestive heart failure, unspecified HF chronicity, unspecified heart failure type (HCC)  Elevated troponin    Rx / DC Orders ED Discharge Orders     None         Remi Deter 01/20/23 1635    Netta Corrigan, PA-C 01/20/23 1636    Bethann Berkshire, MD 01/24/23 1616

## 2023-01-20 NOTE — Telephone Encounter (Signed)
Patient in the ED at this time.

## 2023-01-20 NOTE — ED Notes (Signed)
[facility] called for transport. Attempted to call report to [facility] ED at this time. Unsuccessful.

## 2023-01-20 NOTE — Telephone Encounter (Signed)
Called and spoke to patient who has been having SOB and weakness since Saturday. She report the SOB is worse when walking and lying down. She has to sleep propped up on several pillows. She deny any other symptoms. Patient wanted to be seen but first available was 12/31. Advised her to reach out to PCP to see if they could see her today or tomorrow. If they can't see her advised to go to urgent care. Patient verbalized understanding and agree. She also report BS 130 Sat and 242 Sunday.

## 2023-01-20 NOTE — Consult Note (Addendum)
Cardiology Consultation   Patient ID: Karen [name] MRN: [id number]; DOB: Dec 02, 1928  Admit date: 01/20/2023 Date of Consult: 01/20/2023  PCP:  Karen Reichmann, DO   [facility] Providers Cardiologist:  Thurmon Fair, MD   Patient Profile:   Karen [name] is a 89 y.o. female with a hx of coronary artery disease (2015 cath with 30-40% distal left main stenosis, 40% proximal AV stenosis, 90% mid and distal LAD stenosis, 70-90% mid-distal left circumflex stenosis, 40-50% mid RCA stenosis; status post PCI-DES to LAD and circumflex), history of chronic diastolic heart failure (improved after treatment of coronary disease), hypertension and an ascending aortic aneurysm last measured (June 2019) at 4.7 cm in maximum diameter, CKD, DM who is being seen 01/20/2023 for the evaluation of chest pain with elevated troponin at the request of Evlyn Kanner PA-C.  History of Present Illness:   [name] presented to the ED today for evaluation of worsening shortness of breath, fatigue, chest pain. Symptoms began on Saturday and patient notices breathing worse with exertion or when supine, has needed to sleep with HOB elevated. ED labs notable for BNP 1734.4, HS troponin 481. Creatinine elevated to 2.79. Patient reported ongoing chest discomfort and per cardiology recommendations, ED to ED transfer arranged with patient to be transported to [facility].   Patient's cardiac history notable for extensive CAD. Patient's most recent ischemic evaluation took place in May of 2023 when she had cardiac PET stress test. This did not show any regional perfusion deficits, but did show low myocardial blood flow reserve and overall diminished maximum myocardial blood flow with stress (only 1.8 mL/g/minute). This raised a concern for multivessel coronary artery disease or left main coronary disease. Per subsequent cardiology notes, "unlikely that she would have anatomy amenable to percutaneous revascularization  and she is not a candidate for surgical bypass.  Previous cardiac catheterization could not be performed via the radial artery and then she had a serious groin hemorrhage with her previous procedure." Given this and renal dysfunction, medical management pursued.   She presented to [facility] ED today with progressive dyspnea on exertion and orthopnea.  She states that her blood glucose levels have been elevated in the mornings for the past week.  She also states increasing issues with indigestion citing multiple episodes of belching.  She states that on Saturday she had a sudden episode of dyspnea when she got up to walk to the bathroom.  The episode lasted 2 to 3 minutes.  She lives at home with her son and ambulates with a walker that has a seat.  She does take care of of ADLs but does not leave the house routinely.  She denies adamantly chest pain.  She states that she had progressive dyspnea on exertion and orthopnea.  She called her PCP office today and was directed to the ER.  When she arrived at [facility] ED, cardiology was contacted for ongoing chest pain and she was transferred to [facility] ED for further evaluation.   Past Medical History:  Diagnosis Date   Arthritis    Ascending aortic aneurysm (HCC)    Chronic diastolic CHF (congestive heart failure) (HCC)    Constipation    Coronary artery disease 04/2013   Three-vessel disease, initial medical therapy then stenting of the LAD and CFX 10/2013   Diabetes mellitus    TYPE 2   Fibromyalgia    H/O: gout    Heart murmur    Hx: UTI (urinary tract infection)    Hypertension  Past Surgical History:  Procedure Laterality Date   APPENDECTOMY  1951   BACK SURGERY  1972   CARDIAC CATHETERIZATION  04/2013   Left main 30-40%, prox LAD 40%, mid/distal LAD 90%, CFX 70%, OM 3 90%, RCA 40-50%, PDA 20-80%, EF 50%    CHOLECYSTECTOMY  1951   Closed reduction of Left Total Hip  2008   x 3   CORONARY STENT PLACEMENT  10/11/2013   2.25 x 20 mm Promus DES to  mid/distal LAD & 3.5 x 12 mm Promus DES Mid CX         DR [place]   HERNIA REPAIR  1963   JOINT REPLACEMENT  left hip-2007, left knee-2000, right knee-1997   Left Hip, Bilateral Knee replacements   KNEE SURGERY  1997/2000   LEFT HEART CATHETERIZATION WITH CORONARY ANGIOGRAM N/A 04/25/2013   Procedure: LEFT HEART CATHETERIZATION WITH CORONARY ANGIOGRAM;  Surgeon: Thurmon Fair, MD;  Location: [facility] CATH LAB;  Service: Cardiovascular;  Laterality: N/A;   PERCUTANEOUS CORONARY STENT INTERVENTION (PCI-S) N/A 10/11/2013   Procedure: PERCUTANEOUS CORONARY STENT INTERVENTION (PCI-S);  Surgeon: Peter M [place], MD;  Location: [facility] CATH LAB;  Service: Cardiovascular;  Laterality: N/A;   REPLACEMENT TOTAL HIP W/  RESURFACING IMPLANTS  [id number]   Right Foot Surgery  2004   x 2   Salivary Gland Stone Extraction     SPINAL FUSION  2006   TOTAL HIP ARTHROPLASTY Right 05/14/2020   Procedure: TOTAL HIP ARTHROPLASTY ANTERIOR APPROACH;  Surgeon: Ollen Gross, MD;  Location: [facility] ORS;  Service: Orthopedics;  Laterality: Right;    TOTAL HIP REVISION Left 04/08/2021   Procedure: TOTAL HIP REVISION;  Surgeon: Ollen Gross, MD;  Location: [facility] ORS;  Service: Orthopedics;  Laterality: Left;     Home Medications:  Prior to Admission medications   Medication Sig Start Date End Date Taking? Authorizing Provider  acetaminophen (TYLENOL) 650 MG CR tablet Take 650 mg by mouth at bedtime as needed (arthritis pain).    [provider]  amLODipine (NORVASC) 5 MG tablet Take 1 tablet (5 mg total) by mouth daily. 04/27/22   Corrin Parker, PA-C  aspirin 81 MG chewable tablet Chew 1 tablet (81 mg total) by mouth daily. 05/01/21   Marinda Elk, MD  atorvastatin (LIPITOR) 40 MG tablet Take 40 mg by mouth at bedtime.    [provider]  Calcium Carbonate-Vitamin D 600-400 MG-UNIT tablet Take 1 tablet by mouth every morning.    [provider]  Coenzyme Q10 (CO Q 10) 100 MG CAPS Take 300 mg by  mouth every morning.    [provider]  furosemide (LASIX) 20 MG tablet Take 20 mg every Monday, Wednesday, and Friday 10/20/22   Croitoru, Mihai, MD  glipiZIDE (GLUCOTROL) 10 MG tablet Take 10 mg by mouth 2 (two) times daily.    [provider]  Magnesium 250 MG TABS Take 250 mg by mouth every morning.    [provider]  metFORMIN (GLUCOPHAGE) 1000 MG tablet Take 1 tablet (1,000 mg total) by mouth 2 (two) times daily with a meal. Hold for 2 days, restart on 10/14/2013 Patient taking differently: Take 1,000 mg by mouth 2 (two) times daily with a meal. 10/12/13   Barrett, Joline Salt, PA-C  metoprolol succinate (TOPROL-XL) 100 MG 24 hr tablet TAKE 1 TABLET(100 MG) BY MOUTH EVERY MORNING WITH OR IMMEDIATELY FOLLOWING A MEAL 06/15/22   Marjie Skiff E, PA-C  mirtazapine (REMERON) 15 MG tablet Take 15 mg by  mouth at bedtime.    [provider]  Multiple Vitamin (MULTIVITAMIN WITH MINERALS) TABS tablet Take 1 tablet by mouth every morning.    [provider]  Multiple Vitamins-Minerals (PRESERVISION AREDS 2) CAPS Take 1 capsule by mouth 2 (two) times daily.    [provider]  nitroGLYCERIN (NITROSTAT) 0.4 MG SL tablet Place 1 tablet (0.4 mg total) under the tongue every 5 (five) minutes as needed for chest pain (or SOB). Patient not taking: Reported on 10/20/2022 05/01/21   Marinda Elk, MD  Polyvinyl Alcohol-Povidone (REFRESH OP) 1 drop by Used in bone cement route 3 (three) times daily.    [provider]  potassium chloride SA (K-DUR,KLOR-CON) 20 MEQ tablet Take 1 tablet (20 mEq total) by mouth 2 (two) times daily. Patient taking differently: Take 20 mEq by mouth 2 (two) times daily after a meal. 12/18/13   Rosalio Macadamia, NP  QUEtiapine (SEROQUEL) 200 MG tablet Take 200 mg by mouth at bedtime.    [provider]  valsartan (DIOVAN) 320 MG tablet Take 1 tablet (320 mg total) by mouth daily. Patient taking differently: Take  320 mg by mouth every morning. 09/13/13   Croitoru, Rachelle Hora, MD    Inpatient Medications: Scheduled Meds:   Continuous Infusions:  heparin 800 Units/hr (01/20/23 1717)   PRN Meds: nitroGLYCERIN  Allergies:    Allergies  Allergen Reactions   Lisinopril Cough    Ace inhibitor   Pneumococcal Vaccines Swelling and Other (See Comments)    Very bad pain and swelling in the arm of the shot, needed 2 cortisone shots.   Antihistamines, Diphenhydramine-Type Other (See Comments)   Carvedilol Other (See Comments)    Weakness, dizziness, upset stomach, trembling    Codeine Nausea And Vomiting   Morphine And Codeine Nausea And Vomiting   Ultram [Tramadol Hcl] Itching    Social History:   Social History   Socioeconomic History   Marital status: Widowed    Spouse name: Not on file   Number of children: Not on file   Years of education: Not on file   Highest education level: Not on file  Occupational History   Not on file  Tobacco Use   Smoking status: Never   Smokeless tobacco: Never  Vaping Use   Vaping status: Never Used  Substance and Sexual Activity   Alcohol use: No   Drug use: No   Sexual activity: Never  Other Topics Concern   Not on file  Social History Narrative   Not on file   Social Drivers of Health   Financial Resource Strain: Not on file  Food Insecurity: Not on file  Transportation Needs: No Transportation Needs (11/30/2021)   PRAPARE - Administrator, Civil Service (Medical): No    Lack of Transportation (Non-Medical): No  Physical Activity: Not on file  Stress: Not on file  Social Connections: Not on file  Intimate Partner Violence: Not on file    Family History:    Family History  Problem Relation Age of Onset   Cancer Mother    Heart attack Father    Stroke Father    Heart attack Brother    Cancer Brother      ROS:  Please see the history of present illness.   All other ROS reviewed and negative.     Physical Exam/Data:    Vitals:   01/20/23 1336 01/20/23 1548 01/20/23 1600 01/20/23 1652  BP: (!) 140/80  (!) 136/96 (!) 142/85  Pulse: 89  81 81  Resp: 19  15 (!) 21  Temp: (!) 97.4 F (36.3 C)  97.6 F (36.4 C) 98.8 F (37.1 C)  TempSrc: Oral  Oral Oral  SpO2: 100%  100% 100%  Weight:  59.9 kg    Height:  5\' 4"  (1.626 m)     No intake or output data in the 24 hours ending 01/20/23 1829    01/20/2023    3:48 PM 10/20/2022    2:53 PM 04/27/2022    3:23 PM  Last 3 Weights  Weight (lbs) 132 lb 135 lb 9.6 oz 126 lb 9.6 oz  Weight (kg) 59.875 kg 61.508 kg 57.425 kg     Body mass index is 22.66 kg/m.  General:  elderly female in NAD HEENT: normal Neck: no JVD Vascular: No carotid bruits; Distal pulses 2+ bilaterally Cardiac:  normal S1, S2; RRR; no murmur  Lungs:  clear to auscultation bilaterally, no wheezing, rhonchi or rales  Abd: soft, nontender, no hepatomegaly  Ext: no edema Musculoskeletal:  No deformities, BUE and BLE strength normal and equal Skin: warm and dry  Neuro:  CNs 2-12 intact, no focal abnormalities noted Psych:  Normal affect   EKG:  The EKG was personally reviewed and demonstrates:  sinus rhythm with incomplete RBBB. Borderline ST elevation in septal leads Telemetry:  Telemetry was personally reviewed and demonstrates:  sinus rhythm in the 70s  Relevant CV Studies:   06/30/21 PET Stress test    LV perfusion is normal. There is no evidence of ischemia. There is no evidence of infarction. TID was present (1.39) and confirmed visually. EF dropped with stress (68%->66%). Myocardial blood flow reserve abnormal (1.22) but this was influenced by high rest flow due to elevated blood pressure (1.50 ml/min/g). Regardless, stress myocardial blood flow abnormal (1.8 ml/min/g). Overall, findings are concerning for left main or 3-vessel CAD due to drop in EF and TID despite normal perfusion imaging. MBF abnormal as well. Would consider invasive angiography for clarification.   Rest left  ventricular function is normal. Rest EF: 68 %. Stress left ventricular function is normal. Stress EF: 66 %. End diastolic cavity size is normal.   Myocardial blood flow was computed to be 1.55ml/g/min at rest and 1.71ml/g/min at stress. Global myocardial blood flow reserve was 1.22 and was abnormal.   Coronary calcium assessment not performed due to prior revascularization.   The study is normal. The study is high risk.   Electronically signed by Lennie Odor, MD   CLINICAL DATA:  This over-read does not include interpretation of cardiac or coronary anatomy or pathology. The Cardiac PET CT interpretation by the cardiologist is attached.   COMPARISON:  CT angio chest 08/06/2018   FINDINGS: Vascular: Extensive aortic atherosclerosis. There is aneurysmal dilatation of the ascending thoracic aorta which appears unchanged from the previous exam measuring 4.8 cm, image 25/3.   Mediastinum/Nodes: No signs of mediastinal mass or adenopathy.   Lungs/Pleura: There is no pleural effusion, airspace consolidation or pneumothorax. Unchanged appearance of 2-3 mm nodules within the anterior left upper lobe, image 1/4 and image 3/4. No suspicious lung nodules identified.   Upper Abdomen: No acute abnormality   Musculoskeletal: Spondylosis identified throughout the thoracic spine.   IMPRESSION: 1. Stable 4.8 cm thoracic aortic aneurysm. Ascending thoracic aortic aneurysm. Recommend semi-annual imaging followup by CTA or MRA and referral to cardiothoracic surgery if not already obtained. This recommendation follows 2010 ACCF/AHA/AATS/ACR/ASA/SCA/SCAI/SIR/STS/SVM Guidelines for the diagnosis and Management of Patients With Thoracic Aortic  Disease. Circulation. 2010; 121: [id number] 2. Aortic aneurysm NOS (ICD10-I71.9). Aortic Atherosclerosis (ICD10-I70.0).  04/27/21 TTE  IMPRESSIONS     1. Distal septal hypokinesis . Left ventricular ejection fraction, by  estimation, is 50 to 55%. The left  ventricle has low normal function. The  left ventricle demonstrates regional wall motion abnormalities (see  scoring diagram/findings for  description). There is mild asymmetric left ventricular hypertrophy of the  basal and septal segments. Left ventricular diastolic parameters are  indeterminate.   2. Right ventricular systolic function is normal. The right ventricular  size is normal.   3. Left atrial size was mildly dilated.   4. The mitral valve is degenerative. Trivial mitral valve regurgitation.  No evidence of mitral stenosis. Moderate mitral annular calcification.   5. The aortic valve is tricuspid. There is moderate calcification of the  aortic valve. There is moderate thickening of the aortic valve. Aortic  valve regurgitation is trivial. Aortic valve sclerosis/calcification is  present, without any evidence of  aortic stenosis.   6. The inferior vena cava is normal in size with greater than 50%  respiratory variability, suggesting right atrial pressure of 3 mmHg.   Laboratory Data:  High Sensitivity Troponin:   Recent Labs  Lab 01/20/23 1400 01/20/23 1540  TROPONINIHS 481* 838*     Chemistry Recent Labs  Lab 01/20/23 1400  NA 132*  K 5.4*  CL 101  CO2 17*  GLUCOSE 374*  BUN 67*  CREATININE 2.79*  CALCIUM 9.8  GFRNONAA 15*  ANIONGAP 14    No results for input(s): "PROT", "ALBUMIN", "AST", "ALT", "ALKPHOS", "BILITOT" in the last 168 hours. Lipids No results for input(s): "CHOL", "TRIG", "HDL", "LABVLDL", "LDLCALC", "CHOLHDL" in the last 168 hours.  Hematology Recent Labs  Lab 01/20/23 1400  WBC 8.1  RBC 2.93*  HGB 10.4*  HCT 32.1*  MCV 109.6*  MCH 35.5*  MCHC 32.4  RDW 13.0  PLT 115*   Thyroid No results for input(s): "TSH", "FREET4" in the last 168 hours.  BNP Recent Labs  Lab 01/20/23 1400  BNP 1,734.4*    DDimer No results for input(s): "DDIMER" in the last 168 hours.   Radiology/Studies:  DG Chest 2 View Result Date:  01/20/2023 CLINICAL DATA:  Shortness of breath EXAM: CHEST - 2 VIEW COMPARISON:  04/27/2021 FINDINGS: Heart size within normal limits. Known thoracic aortic aneurysm with extensive atherosclerotic calcification. Stable mediastinal contours. No focal airspace consolidation, pleural effusion, or pneumothorax. Multilevel degenerative spondylosis of the thoracic spine. Degenerative changes of the left shoulder. IMPRESSION: 1. No active cardiopulmonary disease. 2. Known thoracic aortic aneurysm. Electronically Signed   By: Duanne Guess D.O.   On: 01/20/2023 15:17     Assessment and Plan:   CAD Elevated troponin -- hs troponin elevated 481 --> 838 -- she adamantly denies chest pain - EKG does show minimal ST elevation in V1-V2 with T wave inversions -- She has known CAD including left main and LAD disease with an abnormal PET stress test in 2023 - Medical therapy has been discussed previously - Given lack of chest pain, we will continue with 48 hours of heparin at this time - Troponin elevation likely related to a component of demand ischemia in the setting of suspected   Acute on chronic HFpEF - BNP > 1700 - CXR with enlarged aorta - exam is somewhat out of proportion to her symptoms and BNP - Given renal function, will attempt diuresis overnight with 1 dose of 80 mg IV Lasix -  BMP in the a.m. prior to further dosing of Lasix - Will need strict I's and O's to monitor diuresis   Acute on chronic renal insufficiency stage IIIb Hyperkalemia -- sCr 2.79, K 5.4 -- Baseline appears to be 1.7 - She does not currently follow with nephrology -- will collect BMP tomorrow morning   TAAA - Medical management, no plans for intervention - will control blood pressure   Hypertension - Given renal function, please hold Diovan tonight - May continue 5 mg amlodipine, can titrate this to 10 mg - If additional blood pressure management is needed, consider 25 mg hydralazine 3 times  daily   Complicated clinical scenario.  Elevated BNP along with dyspnea on exertion and worsening orthopnea indicate likely HFpEF exacerbation.  On exam, she is not grossly volume up.  The symptoms may be her anginal equivalent.  Elevated troponin likely due to known CAD, although no frank chest pain.  We will attempt diuresis tonight with 80 mg of IV Lasix x 1 dose and monitor diuresis.  Plan BMP in the morning to reevaluate clinically prior to scheduled diuresis.    Risk Assessment/Risk Scores:    New York Heart Association (NYHA) Functional Class NYHA Class III        For questions or updates, please contact [facility] Please consult [email or web address] for contact info under    Signed, Marcelino Duster, [place]  01/20/2023 6:29 PM  Personally seen and examined. Agree with above.  89 year old female with shortness of breath, anginal equivalent, with recent high risk PET stress CT compatible with multivessel disease or left main disease with troponin elevation in the 800 range consistent with type II myocardial infarction.   Very pleasant, conversant, son in room.  She is quite independent.  She also has a 4.8 cm ascending aortic aneurysm-nonsurgical  On exam pleasant, alert and orient x 3, no significant crackles or wheezes no significant edema appreciated regular rate and rhythm  Assessment and plan:  HFpEF -We will go ahead and administer IV Lasix 80 mg tonight.  Reassess in the morning.  Creatinine 2.6 elevated from prior.  Her symptoms of shortness of breath are likely her anginal equivalent.  Even though outwardly she does not appear to be volume overloaded certainly this could be the case.  Her Lasix dose was decreased recently due to rising creatinine.  Type II MI - Reasonable to continue IV heparin for 24 to 48 hours.  Doubt that this is plaque rupture but more a supply/demand mismatch in the setting of likely severe coronary artery disease.  No cardiac  catheterization.  This does increase her overall morbidity and mortality.  Thoracic aortic aneurysm - 4.7 to 4.8 cm.  Nonsurgical.  Medical management.  Blood pressure control.  We will continue to follow. Discussed case with her primary cardiologist.  Donato Schultz, MD

## 2023-01-20 NOTE — ED Provider Notes (Addendum)
  Physical Exam  BP (!) 142/85 (BP Location: Left Arm)   Pulse 81   Temp 98.8 F (37.1 C) (Oral)   Resp (!) 21   Ht 5\' 4"  (1.626 m)   Wt 59.9 kg   LMP 05/10/1998   SpO2 100%   BMI 22.66 kg/m   Physical Exam  Procedures  Procedures  ED Course / MDM    Medical Decision Making Amount and/or Complexity of Data Reviewed Labs: ordered. Radiology: ordered.  Risk OTC drugs. Prescription drug management.   Patient is transferred from [facility] for cardiology consultation with CHF and elevated troponin.  Patient has had aspirin and heparin.  Upon recheck, patient is alert and in no distress.  She denies any chest pain or shortness of breath at rest.  She reports that she has never had chest pain but gets significant exertional dyspnea.  I have reviewed with Dr. Rennis Golden cardiology, cardiology will consult and plan for medical admission.  Consult: [facility] hospitalist Dr. Dalene Carrow for admission.        Arby Barrette, MD 01/20/23 Malvin Johns    Arby Barrette, MD 01/20/23 2031

## 2023-01-20 NOTE — Telephone Encounter (Signed)
Pt c/o Shortness Of Breath: STAT if SOB developed within the last 24 hours or pt is noticeably SOB on the phone  1. Are you currently SOB (can you hear that pt is SOB on the phone)?  No   2. How long have you been experiencing SOB?  Since Saturday, 12/07  3. Are you SOB when sitting or when up moving around?  When up and moving around + laying down   4. Are you currently experiencing any other symptoms?  Weakness + elevated sugar level

## 2023-01-20 NOTE — ED Triage Notes (Signed)
Pt presents with c/o shortness of breath that has been going on since Saturday. Pt is visibly short of breath at this time. Pt denies any pain but reports that she is just having a hard time breathing. Pt reports a hx of MI in 2023 as well as a hx of CHF.

## 2023-01-20 NOTE — ED Triage Notes (Signed)
  Pt presents with c/o shortness of breath that has been going on since Saturday. Pt is visibly short of breath at this time. Pt denies any pain but reports that she is just having a hard time breathing. Pt reports a hx of MI in 2023 as well as a hx of CHF.

## 2023-01-20 NOTE — H&P (Signed)
History and Physical      Karen [name] ZOX:[id number] DOB: [birth year] DOA: 01/20/2023; DOS: 01/20/2023  PCP: Irena Reichmann, DO  Patient coming from: home   I have personally briefly reviewed patient's old medical records in [facility]  Chief Complaint: Shortness of breath  HPI: Karen [name] is a 89 y.o. female with medical history significant for chronic diastolic heart failure, type 2 diabetes mellitus, essential hypertension, CKD stage IV associated baseline creatinine 1.5-1.8, chronic macrocytic anemia with baseline hemoglobin range 10-12, who is admitted to [facility] on 01/20/2023 with acute on chronic diastolic heart failure after presenting from home to [facility] ED complaining of shortness of breath.   The patient initially presented to [facility] emergency department this evening complaining of 2 days of progressive shortness of breath associate with orthopnea, in the absence of any chest pain, palpitations, diaphoresis, nausea, vomiting.  Denies any associated subjective fever, chills, rigors, or generalized myalgias.   Medical history notable for chronic diastolic heart failure, with most recent echocardiogram in March 2023 notable for LVEF 50 to 55%, indeterminate diastolic parameters, normal right ventricular systolic function and trivial mitral gravitation.  On lisinopril 20 mg every 48 hours as an outpatient.  She has a history of coronary artery disease status post PCI with stent placement to the LAD and circumflex in September 2015.     [facility] ED Course:   Of note, with the patient initially presented to will as well as emergency room this evening, she subsequently underwent ED to ED transfer to [facility] to be evaluated by cardiology.  Vital signs in the ED were notable for the following: Afebrile; heart rates in the 70s to 80s; systolic blood pressures in the 120s to 140s; respiratory rate 14-21, oxygen saturation 99 to 100% on room air.  Labs  were notable for the following: BMP notable for the following: Sodium 132, potassium 5.4, bicarbonate 17, anion gap 14, creatinine 2.79 compared to most recent prior value of 1.68 on 05/15/2021, BUN to creatinine ratio 24, glucose 374.  BNP 1734 compared to most recent prior value 162 in March 2023.  High sensitive troponin I initially 41, with repeat value trending up to 838, compared to most recent prior value of 151 in March 2023.  CBC notable for white cell count 8100, hemoglobin 10.846 with macrocytic findings and relative to most recent prior hemoglobin value of 10.7 in April 2023.  Per my interpretation, EKG in ED demonstrated the following: Sinus rhythm with heart rate 81, normal intervals, nonspecific T wave inversions in leads I, aVL, V1, V2, which appear new relative to most recent prior EKG on 10/20/2022, also showing nonspecific less than 1 mm ST elevation in V1.  Imaging in the ED, per corresponding formal radiology read, was notable for the following: 2 view chest x-ray showed no evidence of acute cardiopulmonary process.  [facility] has seen patient and consulted, recommending a single dose of IV Lasix this evening, with close monitoring of ensuing renal function to guide additional IV diuresis.  They suspect elevated troponin as a consequence of type II supply/demand mismatch in the setting of acute on chronic diastolic heart failure, but also recommend 48 hours of heparin drip.   While in the ED, the following were administered: Full dose aspirin x 1, Lasix 80 mg IV x 1 dose, heparin bolus followed by initiation of heparin drip.  Subsequently, the patient was admitted for further evaluation management of suspected acute on chronic diastolic heart failure complicated  by acute kidney injury superimposed on CKD stage IV, elevated troponin, and mild hyperkalemia.     Review of Systems: As per HPI otherwise 10 point review of systems negative.   Past Medical History:  Diagnosis Date    Arthritis    Ascending aortic aneurysm (HCC)    Chronic diastolic CHF (congestive heart failure) (HCC)    Constipation    Coronary artery disease 04/2013   Three-vessel disease, initial medical therapy then stenting of the LAD and CFX 10/2013   Diabetes mellitus    TYPE 2   Fibromyalgia    H/O: gout    Heart murmur    Hx: UTI (urinary tract infection)    Hypertension     Past Surgical History:  Procedure Laterality Date   APPENDECTOMY  1951   BACK SURGERY  1972   CARDIAC CATHETERIZATION  04/2013   Left main 30-40%, prox LAD 40%, mid/distal LAD 90%, CFX 70%, OM 3 90%, RCA 40-50%, PDA 20-80%, EF 50%    CHOLECYSTECTOMY  1951   Closed reduction of Left Total Hip  2008   x 3   CORONARY STENT PLACEMENT  10/11/2013   2.25 x 20 mm Promus DES to mid/distal LAD & 3.5 x 12 mm Promus DES Mid CX         DR [place]   HERNIA REPAIR  1963   JOINT REPLACEMENT  left hip-2007, left knee-2000, right knee-1997   Left Hip, Bilateral Knee replacements   KNEE SURGERY  1997/2000   LEFT HEART CATHETERIZATION WITH CORONARY ANGIOGRAM N/A 04/25/2013   Procedure: LEFT HEART CATHETERIZATION WITH CORONARY ANGIOGRAM;  Surgeon: Thurmon Fair, MD;  Location: [facility] CATH LAB;  Service: Cardiovascular;  Laterality: N/A;   PERCUTANEOUS CORONARY STENT INTERVENTION (PCI-S) N/A 10/11/2013   Procedure: PERCUTANEOUS CORONARY STENT INTERVENTION (PCI-S);  Surgeon: Peter M [place], MD;  Location: [facility] CATH LAB;  Service: Cardiovascular;  Laterality: N/A;   REPLACEMENT TOTAL HIP W/  RESURFACING IMPLANTS  [id number]   Right Foot Surgery  2004   x 2   Salivary Gland Stone Extraction     SPINAL FUSION  2006   TOTAL HIP ARTHROPLASTY Right 05/14/2020   Procedure: TOTAL HIP ARTHROPLASTY ANTERIOR APPROACH;  Surgeon: Ollen Gross, MD;  Location: [facility] ORS;  Service: Orthopedics;  Laterality: Right;    TOTAL HIP REVISION Left 04/08/2021   Procedure: TOTAL HIP REVISION;  Surgeon: Ollen Gross, MD;  Location: [facility] ORS;  Service:  Orthopedics;  Laterality: Left;    Social History:  reports that she has never smoked. She has never used smokeless tobacco. She reports that she does not drink alcohol and does not use drugs.   Allergies  Allergen Reactions   Lisinopril Cough    Ace inhibitor   Pneumococcal Vaccines Swelling and Other (See Comments)    Very bad pain and swelling in the arm of the shot, needed 2 cortisone shots.   Antihistamines, Diphenhydramine-Type Other (See Comments)   Carvedilol Other (See Comments)    Weakness, dizziness, upset stomach, trembling    Codeine Nausea And Vomiting   Morphine And Codeine Nausea And Vomiting   Ultram [Tramadol Hcl] Itching    Family History  Problem Relation Age of Onset   Cancer Mother    Heart attack Father    Stroke Father    Heart attack Brother    Cancer Brother     Family history reviewed and not pertinent    Prior to Admission medications   Medication Sig Start Date End Date  Taking? Authorizing Provider  acetaminophen (TYLENOL) 650 MG CR tablet Take 650 mg by mouth at bedtime as needed (arthritis pain).    [provider]  amLODipine (NORVASC) 5 MG tablet Take 1 tablet (5 mg total) by mouth daily. 04/27/22   Corrin Parker, PA-C  aspirin 81 MG chewable tablet Chew 1 tablet (81 mg total) by mouth daily. 05/01/21   Marinda Elk, MD  atorvastatin (LIPITOR) 40 MG tablet Take 40 mg by mouth at bedtime.    [provider]  Calcium Carbonate-Vitamin D 600-400 MG-UNIT tablet Take 1 tablet by mouth every morning.    [provider]  Coenzyme Q10 (CO Q 10) 100 MG CAPS Take 300 mg by mouth every morning.    [provider]  furosemide (LASIX) 20 MG tablet Take 20 mg every Monday, Wednesday, and Friday 10/20/22   Croitoru, Mihai, MD  glipiZIDE (GLUCOTROL) 10 MG tablet Take 10 mg by mouth 2 (two) times daily.    [provider]  Magnesium 250 MG TABS Take 250 mg by mouth every morning.    [provider]  metFORMIN (GLUCOPHAGE) 1000 MG tablet Take 1 tablet (1,000 mg total) by mouth 2 (two) times daily with a meal. Hold for 2 days, restart on 10/14/2013 Patient taking differently: Take 1,000 mg by mouth 2 (two) times daily with a meal. 10/12/13   Barrett, Joline Salt, PA-C  metoprolol succinate (TOPROL-XL) 100 MG 24 hr tablet TAKE 1 TABLET(100 MG) BY MOUTH EVERY MORNING WITH OR IMMEDIATELY FOLLOWING A MEAL 06/15/22   Marjie Skiff E, PA-C  mirtazapine (REMERON) 15 MG tablet Take 15 mg by mouth at bedtime.    [provider]  Multiple Vitamin (MULTIVITAMIN WITH MINERALS) TABS tablet Take 1 tablet by mouth every morning.    [provider]  Multiple Vitamins-Minerals (PRESERVISION AREDS 2) CAPS Take 1 capsule by mouth 2 (two) times daily.    [provider]  nitroGLYCERIN (NITROSTAT) 0.4 MG SL tablet Place 1 tablet (0.4 mg total) under the tongue every 5 (five) minutes as needed for chest pain (or SOB). Patient not taking: Reported on 10/20/2022 05/01/21   Marinda Elk, MD  Polyvinyl Alcohol-Povidone (REFRESH OP) 1 drop by Used in bone cement route 3 (three) times daily.    [provider]  potassium chloride SA (K-DUR,KLOR-CON) 20 MEQ tablet Take 1 tablet (20 mEq total) by mouth 2 (two) times daily. Patient taking differently: Take 20 mEq by mouth 2 (two) times daily after a meal. 12/18/13   Rosalio Macadamia, NP  QUEtiapine (SEROQUEL) 200 MG tablet Take 200 mg by mouth at bedtime.    [provider]  valsartan (DIOVAN) 320 MG tablet Take 1 tablet (320 mg total) by mouth daily. Patient taking differently: Take 320 mg by mouth every morning. 09/13/13   Croitoru, Rachelle Hora, MD     Objective    Physical Exam: Vitals:   01/20/23 1548 01/20/23 1600 01/20/23 1652 01/20/23 1953  BP:  (!) 136/96 (!) 142/85 138/81  Pulse:  81 81 73  Resp:  15 (!) 21 17  Temp:  97.6 F (36.4 C) 98.8 F (37.1 C) 98.5 F (36.9 C)  TempSrc:  Oral Oral Oral  SpO2:  100% 100%  100%  Weight: 59.9 kg     Height: 5\' 4"  (1.626 m)       General: appears to be stated age; alert, oriented; mildly increased work of breathing noted Skin: warm, dry, no rash Head:  AT/Brady  Mouth:  Oral mucosa membranes appear moist, normal dentition Neck: supple; trachea midline Heart:  RRR; did not appreciate any M/R/G Lungs: CTAB, did not appreciate any wheezes, rales, or rhonchi Abdomen: + BS; soft, ND, NT Vascular: 2+ pedal pulses b/l; 2+ radial pulses b/l Extremities: no peripheral edema, no muscle wasting Neuro: strength and sensation intact in upper and lower extremities b/l    Labs on Admission: I have personally reviewed following labs and imaging studies  CBC: Recent Labs  Lab 01/20/23 1400  WBC 8.1  NEUTROABS 6.6  HGB 10.4*  HCT 32.1*  MCV 109.6*  PLT 115*   Basic Metabolic Panel: Recent Labs  Lab 01/20/23 1400  NA 132*  K 5.4*  CL 101  CO2 17*  GLUCOSE 374*  BUN 67*  CREATININE 2.79*  CALCIUM 9.8   GFR: Estimated Creatinine Clearance: 11.1 mL/min (A) (by C-G formula based on SCr of 2.79 mg/dL (H)). Liver Function Tests: No results for input(s): "AST", "ALT", "ALKPHOS", "BILITOT", "PROT", "ALBUMIN" in the last 168 hours. No results for input(s): "LIPASE", "AMYLASE" in the last 168 hours. No results for input(s): "AMMONIA" in the last 168 hours. Coagulation Profile: No results for input(s): "INR", "PROTIME" in the last 168 hours. Cardiac Enzymes: No results for input(s): "CKTOTAL", "CKMB", "CKMBINDEX", "TROPONINI" in the last 168 hours. BNP (last 3 results) No results for input(s): "PROBNP" in the last 8760 hours. HbA1C: No results for input(s): "HGBA1C" in the last 72 hours. CBG: No results for input(s): "GLUCAP" in the last 168 hours. Lipid Profile: No results for input(s): "CHOL", "HDL", "LDLCALC", "TRIG", "CHOLHDL", "LDLDIRECT" in the last 72 hours. Thyroid Function Tests: No results for input(s): "TSH", "T4TOTAL", "FREET4", "T3FREE",  "THYROIDAB" in the last 72 hours. Anemia Panel: No results for input(s): "VITAMINB12", "FOLATE", "FERRITIN", "TIBC", "IRON", "RETICCTPCT" in the last 72 hours. Urine analysis:    Component Value Date/Time   COLORURINE STRAW (A) 04/26/2021 0026   APPEARANCEUR CLEAR 04/26/2021 0026   LABSPEC 1.009 04/26/2021 0026   PHURINE 5.0 04/26/2021 0026   GLUCOSEU >=500 (A) 04/26/2021 0026   HGBUR NEGATIVE 04/26/2021 0026   BILIRUBINUR NEGATIVE 04/26/2021 0026   KETONESUR 5 (A) 04/26/2021 0026   PROTEINUR NEGATIVE 04/26/2021 0026   UROBILINOGEN 0.2 05/14/2012 0933   NITRITE NEGATIVE 04/26/2021 0026   LEUKOCYTESUR SMALL (A) 04/26/2021 0026    Radiological Exams on Admission: DG Chest 2 View Result Date: 01/20/2023 CLINICAL DATA:  Shortness of breath EXAM: CHEST - 2 VIEW COMPARISON:  04/27/2021 FINDINGS: Heart size within normal limits. Known thoracic aortic aneurysm with extensive atherosclerotic calcification. Stable mediastinal contours. No focal airspace consolidation, pleural effusion, or pneumothorax. Multilevel degenerative spondylosis of the thoracic spine. Degenerative changes of the left shoulder. IMPRESSION: 1. No active cardiopulmonary disease. 2. Known thoracic aortic aneurysm. Electronically Signed   By: Duanne Guess D.O.   On: 01/20/2023 15:17      Assessment/Plan   Principal Problem:   Acute on chronic diastolic heart failure (HCC) Active Problems:   DM2 (diabetes mellitus, type 2) (HCC)   Essential hypertension   Elevated troponin   Macrocytic anemia   Acute renal failure superimposed on stage 4 chronic kidney disease (HCC)   Hyperkalemia    #) Acute on chronic diastolic heart failure: dx of acute decompensation on the basis of presenting 2 days of progressive shortness of breath associate with orthopnea, interval increase in BNP from [phone], although it is noted that chest x-ray shows no evidence of acute cardiopulmonary process.. This is in the context  of a known  history of chronic diastolic heart failure, with most recent echocardiogram performed in March 2023, which was notable for LVEF 53 5%, indeterminate diastolic parameters, with additional results as conveyed above. Etiology leading to presenting acutely decompensated heart failure is not entirely clear at this time .   The patient does have a mildly elevated troponin, which, per cardiology consult, appears most consistent with type II supply/demand mismatch as consequence of acute on chronic heart failure exacerbation itself, this fluid detailed below, as opposed to representing ACS as a result of a type I process secondary to a acute plaque rupture.  Per formal cardiology consult, they recommend providing a single dose of IV Lasix this evening, with close monitoring of ensuing renal function in the context of AKI on CKD 4.  Of note, she has received this recommended single dose of IV Lasix in the ED this evening.  As the patient is already on beta-blocker at home, will continue this, but reduce dose by half in the setting of suspected acutely decompensated heart failure.   Plan: monitor strict I's & O's and daily weights. Monitor on telemetry. CMP in the morning, including for monitoring trend of potassium, bicarbonate, and renal function in response to interval diuresis efforts. Add-on serum magnesium level, and repeat this level in the AM. Close monitoring of ensuing blood pressure response to diuresis efforts, including to help guide need for improvement in afterload reduction in order to optimize cardiac output. Trend troponin.  Formal cardiology consult, as above.  Continue outpatient metoprolol succinate, but at reduced dose of 50 mg p.o. daily.  Echocardiogram ordered for the morning.  Holding home potassium supplementation in the setting of mild hyperkalemia.                    #) Elevated troponin: elevated initial troponin of 481, with repeat value trending up to 838.  Relative  to most recent prior troponin level of 151 in March 2023.   Per cardiology consultation, this elevation appears most consistent with a type II supply/demand mismatch as a result of presenting acute on chronic diastolic heart failure as opposed to representing a type I process due to acute plaque rupture. Additionally, relative decline in renal clearance of troponin in the setting of AKI is likely also contributing to presenting troponin elevation.   EKG shows nonspecific changes, without evidence of STEMI, and the patient denies any recent chest discomfort.  Presentation clinically less suggestive of acute PE.   The patient is already received a full dose aspirin, and cardiology recommends initiation of heparin drip.   Plan: repeat troponin in the AM. Monitor on telemetry. PRN EKG for development of chest pain. Check serum Mg level and repeat BMP in the morning.  Repeat CBC in the AM. Additional evaluation and management of presenting acute on chronic diastolic heart failure as well as AKI on CKD 4 as suspected driving force behind mildly elevated troponin, as above.  Formal cardiology consult, as above.  Heparin drip, as above.  Continue outpatient high intensity atorvastatin.  Continue outpatient metoprolol succinate, but at half dose in the setting of acute decompensated heart failure.  Hold home valsartan for now in the setting of AKI and hyperkalemia.  Echocardiogram in the morning.                #) Acute Kidney Injury superimposed on CKD stage IV: In the setting of history of CKD stage IV with baseline creatinine range 1.51.8, with most recent  prior creatinine data point noted to be 1.68 on 05/15/2021, presenting creatinine found to be 2.79.  Appears prerenal in nature, consistent with concomitant acute prerenal azotemia in the setting of diminished renal perfusion gradient as a result of presenting acute on chronic diastolic heart failure, with potential oncologic exacerbation in the  setting of outpatient valsartan.  Cardiology recommends a single dose of Lasix 80 mg IV, with close monitoring of serial renal function to help guide additional dosing of diuretic intervention.  Plan: monitor strict I's & O's and daily weights. Attempt to avoid nephrotoxic agents. Refrain from NSAIDs. Repeat CMP in the morning. Check serum magnesium level.  Check urinalysis with microscopy . add-on random urine sodium and random urine creatinine.  Single dose of IV Lasix, per cardiology recommendation, as above.  Hold home valsartan as well as scheduled potassium chloride                 #) Hyperkalemia: Mildly elevated potassium level of 5.4, with likely contributions from AKI superimposed on CKD 4, with pharmacologic exacerbation in the setting of outpatient valsartan as well as potassium chloride supplementation.  Without associated EKG changes.  Of note, the patient has received cardiology as recommended dose of Lasix 80 mg IV x 1, and will closely monitor for ensuing renal function trend as a result of this intervention.  Plan: Lokelma 10 g p.o. x 1 dose now.  Repeat BMP at 2300.  Hold her valsartan.  Hold home potassium chloride 20 mill colons p.o. twice daily.  Monitor on telemetry.  Add on serum magnesium level.  Further evaluation management of AKI on CKD 4, as above.                   #) Type 2 Diabetes Mellitus: documented history of such. Home insulin regimen: None. Home oral hypoglycemic agents: Metformin, glipizide. presenting blood sugar: 374, without evidence of anion gap metabolic acidosis. Most recent A1c noted to be 9.1% when checked in March 2023.  Plan: accuchecks QAC and HS with low dose SSI.  Hold home oral hypoglycemic agents for now.  Add on hemoglobin A1c level.                  #) Essential Hypertension: documented h/o such, with outpatient antihypertensive regimen including Norvasc, metoprolol succinate 100 mg p.o. daily, every  48 hour Lasix, valsartan..  SBP's in the ED today: 120s to 140s mmHg.   Plan: Close monitoring of subsequent BP via routine VS. continue outpatient beta-blocker, but reduce dose by half the setting of acutely calcium heart failure.  Hold him valsartan for now in setting of AKI and hyperkalemia.  Continue Norvasc.  IV Lasix, per cardiology recommendations as above.                  #) Chronic macrocytic anemia: Documented history of such, a/w with baseline hgb range 10-12, with presenting hgb consistent with this range, in the absence of any overt evidence of active bleed.     Plan: Repeat CBC in the morning.         DVT prophylaxis: SCD's   Code Status: Full code Family Communication: none Disposition Plan: Per Rounding Team Consults called: EDP has d/w on-call [facility] cardiology, who are formally consulting, as further detailed above;  Admission status: inpatient     I SPENT GREATER THAN 75  MINUTES IN CLINICAL CARE TIME/MEDICAL DECISION-MAKING IN COMPLETING THIS ADMISSION.      Angie Fava DO [facility]  From 7PM - 7AM   01/20/2023, 8:38 PM

## 2023-01-20 NOTE — Progress Notes (Signed)
PHARMACY - ANTICOAGULATION CONSULT NOTE  Pharmacy Consult for Heparin Indication: chest pain/ACS  Allergies  Allergen Reactions   Lisinopril Cough    Ace inhibitor   Pneumococcal Vaccines Swelling and Other (See Comments)    Very bad pain and swelling in the arm of the shot, needed 2 cortisone shots.   Antihistamines, Diphenhydramine-Type Other (See Comments)   Carvedilol Other (See Comments)    Weakness, dizziness, upset stomach, trembling    Codeine Nausea And Vomiting   Morphine And Codeine Nausea And Vomiting   Ultram [Tramadol Hcl] Itching    Patient Measurements: Height: 5\' 4"  (162.6 cm) Weight: 59.9 kg (132 lb) IBW/kg (Calculated) : 54.7 Heparin Dosing Weight: 59.9 kg  Vital Signs: Temp: 97.4 F (36.3 C) (12/12 1336) Temp Source: Oral (12/12 1336) BP: 140/80 (12/12 1336) Pulse Rate: 89 (12/12 1336)  Labs: Recent Labs    01/20/23 1400  HGB 10.4*  HCT 32.1*  PLT 115*  CREATININE 2.79*  TROPONINIHS 481*    Estimated Creatinine Clearance: 11.1 mL/min (A) (by C-G formula based on SCr of 2.79 mg/dL (H)).   Medical History: Past Medical History:  Diagnosis Date   Arthritis    Ascending aortic aneurysm (HCC)    Chronic diastolic CHF (congestive heart failure) (HCC)    Constipation    Coronary artery disease 04/2013   Three-vessel disease, initial medical therapy then stenting of the LAD and CFX 10/2013   Diabetes mellitus    TYPE 2   Fibromyalgia    H/O: gout    Heart murmur    Hx: UTI (urinary tract infection)    Hypertension     Assessment: Active Problem(s): SOB and weakness x several days esp when walking and lying down. Must sleep on several pillow.  - BNP 1734 and Troponin 481  AC/Heme: CP/ACS with elevated troponin. No anticoagulation PTA - Hgb 10.4 (at baseline). Plts 115 significantly lower than 2023.  Goal of Therapy:  Heparin level 0.3-0.7 units/ml Monitor platelets by anticoagulation protocol: Yes   Plan:  Heparin 4000 unit IV  bolus Heparin infusion 800 units/hr Check heparin level in 6-8h hrs Daily HL and CBC    Maelyn Berrey S. Merilynn Finland, PharmD, BCPS Clinical Staff Pharmacist [email or web address] Merilynn Finland, [employer] 01/20/2023,3:49 PM

## 2023-01-20 NOTE — ED Notes (Signed)
ED TO INPATIENT HANDOFF REPORT  ED Nurse Name and Phone #:  Dwain Sarna RN [phone]  S Name/Age/Gender Karen [name] 89 y.o. female Room/Bed: 005C/005C  Code Status   Code Status: Full Code  Home/SNF/Other Home Patient oriented to: self, place, time, and situation Is this baseline? Yes   Triage Complete: Triage complete  Chief Complaint Acute on chronic diastolic heart failure (HCC) [I50.33]  Triage Note Pt presents with c/o shortness of breath that has been going on since Saturday. Pt is visibly short of breath at this time. Pt denies any pain but reports that she is just having a hard time breathing. Pt reports a hx of MI in 2023 as well as a hx of CHF.    Pt presents with c/o shortness of breath that has been going on since Saturday. Pt is visibly short of breath at this time. Pt denies any pain but reports that she is just having a hard time breathing. Pt reports a hx of MI in 2023 as well as a hx of CHF.      Allergies Allergies  Allergen Reactions   Lisinopril Cough    Ace inhibitor   Pneumococcal Vaccines Swelling and Other (See Comments)    Very bad pain and swelling in the arm of the shot, needed 2 cortisone shots.   Antihistamines, Diphenhydramine-Type Other (See Comments)   Carvedilol Other (See Comments)    Weakness, dizziness, upset stomach, trembling    Codeine Nausea And Vomiting   Morphine And Codeine Nausea And Vomiting   Ultram [Tramadol Hcl] Itching    Level of Care/Admitting Diagnosis ED Disposition     ED Disposition  Admit   Condition  --   Comment  Hospital Area[facility] [[id number]]  Level of Care: Telemetry Cardiac [[id number]]  May admit patient to [facility] or [facility] if equivalent level of care is available:: No  Covid Evaluation: Asymptomatic - no recent exposure (last 10 days) testing not required  Diagnosis: Acute on chronic diastolic heart failure (HCC) [428.33.ICD-9-CM]  Admitting Physician: Angie Fava [[id number]]  Attending Physician: Angie Fava [[id number]]  Certification:: I certify this patient will need inpatient services for at least 2 midnights  Expected Medical Readiness: 01/22/2023          B Medical/Surgery History Past Medical History:  Diagnosis Date   Arthritis    Ascending aortic aneurysm (HCC)    Chronic diastolic CHF (congestive heart failure) (HCC)    Constipation    Coronary artery disease 04/2013   Three-vessel disease, initial medical therapy then stenting of the LAD and CFX 10/2013   Diabetes mellitus    TYPE 2   Fibromyalgia    H/O: gout    Heart murmur    Hx: UTI (urinary tract infection)    Hypertension    Past Surgical History:  Procedure Laterality Date   APPENDECTOMY  1951   BACK SURGERY  1972   CARDIAC CATHETERIZATION  04/2013   Left main 30-40%, prox LAD 40%, mid/distal LAD 90%, CFX 70%, OM 3 90%, RCA 40-50%, PDA 20-80%, EF 50%    CHOLECYSTECTOMY  1951   Closed reduction of Left Total Hip  2008   x 3   CORONARY STENT PLACEMENT  10/11/2013   2.25 x 20 mm Promus DES to mid/distal LAD & 3.5 x 12 mm Promus DES Mid CX         DR [place]   HERNIA REPAIR  1963   JOINT REPLACEMENT  left hip-2007, left knee-2000, right knee-1997   Left Hip, Bilateral Knee replacements   KNEE SURGERY  1997/2000   LEFT HEART CATHETERIZATION WITH CORONARY ANGIOGRAM N/A 04/25/2013   Procedure: LEFT HEART CATHETERIZATION WITH CORONARY ANGIOGRAM;  Surgeon: Thurmon Fair, MD;  Location: [facility] CATH LAB;  Service: Cardiovascular;  Laterality: N/A;   PERCUTANEOUS CORONARY STENT INTERVENTION (PCI-S) N/A 10/11/2013   Procedure: PERCUTANEOUS CORONARY STENT INTERVENTION (PCI-S);  Surgeon: Peter M [place], MD;  Location: [facility] CATH LAB;  Service: Cardiovascular;  Laterality: N/A;   REPLACEMENT TOTAL HIP W/  RESURFACING IMPLANTS  4010,2725   Right Foot Surgery  2004   x 2   Salivary Gland Stone Extraction     SPINAL FUSION  2006   TOTAL HIP ARTHROPLASTY Right 05/14/2020    Procedure: TOTAL HIP ARTHROPLASTY ANTERIOR APPROACH;  Surgeon: Ollen Gross, MD;  Location: [facility] ORS;  Service: Orthopedics;  Laterality: Right;    TOTAL HIP REVISION Left 04/08/2021   Procedure: TOTAL HIP REVISION;  Surgeon: Ollen Gross, MD;  Location: [facility] ORS;  Service: Orthopedics;  Laterality: Left;     A IV Location/Drains/Wounds Patient Lines/Drains/Airways Status     Active Line/Drains/Airways     Name Placement date Placement time Site Days   Peripheral IV 01/20/23 20 G Right Antecubital 01/20/23  1541  Antecubital  less than 1   Peripheral IV 01/20/23 20 G 1" Left Forearm 01/20/23  2027  Forearm  less than 1   Incision (Closed) 04/08/21 Hip Left 04/08/21  1931  -- 652            Intake/Output Last 24 hours No intake or output data in the 24 hours ending 01/20/23 2040  Labs/Imaging Results for orders placed or performed during the hospital encounter of 01/20/23 (from the past 48 hours)  Brain natriuretic peptide     Status: Abnormal   Collection Time: 01/20/23  2:00 PM  Result Value Ref Range   B Natriuretic Peptide 1,734.4 (H) 0.0 - 100.0 pg/mL    Comment: Performed at [facility], [address] [place], [place] [address]  Troponin I (High Sensitivity)     Status: Abnormal   Collection Time: 01/20/23  2:00 PM  Result Value Ref Range   Troponin I (High Sensitivity) 481 (HH) <18 ng/L    Comment: CRITICAL RESULT CALLED TO, READ BACK BY AND VERIFIED WITH C.GRANT, RN AT 1531 ON 12.12.24 BY N.THOMPSON (NOTE) Elevated high sensitivity troponin I (hsTnI) values and significant  changes across serial measurements may suggest ACS but many other  chronic and acute conditions are known to elevate hsTnI results.  Refer to the "Links" section for chest pain algorithms and additional  guidance. Performed at [facility], [address] [place], [place] [address]   CBC with Differential     Status: Abnormal   Collection Time:  01/20/23  2:00 PM  Result Value Ref Range   WBC 8.1 4.0 - 10.5 K/uL   RBC 2.93 (L) 3.87 - 5.11 MIL/uL   Hemoglobin 10.4 (L) 12.0 - 15.0 g/dL   HCT 25.9 (L) 56.3 - 87.5 %   MCV 109.6 (H) 80.0 - 100.0 fL   MCH 35.5 (H) 26.0 - 34.0 pg   MCHC 32.4 30.0 - 36.0 g/dL   RDW 64.3 32.9 - 51.8 %   Platelets 115 (L) 150 - 400 K/uL   nRBC 0.0 0.0 - 0.2 %   Neutrophils Relative % 83 %   Neutro Abs 6.6 1.7 - 7.7  K/uL   Lymphocytes Relative 10 %   Lymphs Abs 0.8 0.7 - 4.0 K/uL   Monocytes Relative 7 %   Monocytes Absolute 0.6 0.1 - 1.0 K/uL   Eosinophils Relative 0 %   Eosinophils Absolute 0.0 0.0 - 0.5 K/uL   Basophils Relative 0 %   Basophils Absolute 0.0 0.0 - 0.1 K/uL   Immature Granulocytes 0 %   Abs Immature Granulocytes 0.02 0.00 - 0.07 K/uL    Comment: Performed at [facility], [address] [place], [place] [address]  Basic metabolic panel     Status: Abnormal   Collection Time: 01/20/23  2:00 PM  Result Value Ref Range   Sodium 132 (L) 135 - 145 mmol/L   Potassium 5.4 (H) 3.5 - 5.1 mmol/L   Chloride 101 98 - 111 mmol/L   CO2 17 (L) 22 - 32 mmol/L   Glucose, Bld 374 (H) 70 - 99 mg/dL    Comment: Glucose reference range applies only to samples taken after fasting for at least 8 hours.   BUN 67 (H) 8 - 23 mg/dL   Creatinine, Ser 6.04 (H) 0.44 - 1.00 mg/dL   Calcium 9.8 8.9 - 54.0 mg/dL   GFR, Estimated 15 (L) >60 mL/min    Comment: (NOTE) Calculated using the CKD-EPI Creatinine Equation (2021)    Anion gap 14 5 - 15    Comment: Performed at [facility], [address] [place], [place] [address]  Troponin I (High Sensitivity)     Status: Abnormal   Collection Time: 01/20/23  3:40 PM  Result Value Ref Range   Troponin I (High Sensitivity) 838 (HH) <18 ng/L    Comment: DELTA CHECK NOTED CRITICAL VALUE NOTED. VALUE IS CONSISTENT WITH PREVIOUSLY REPORTED/CALLED VALUE PATIENT TRANSFERRED TO [place] PER LISA H. IN E.D. @1742  01/20/23  NT. (NOTE) Elevated high sensitivity troponin I (hsTnI) values and significant  changes across serial measurements may suggest ACS but many other  chronic and acute conditions are known to elevate hsTnI results.  Refer to the "Links" section for chest pain algorithms and additional  guidance. Performed at [facility], [address] [place], [place] [address]    DG Chest 2 View Result Date: 01/20/2023 CLINICAL DATA:  Shortness of breath EXAM: CHEST - 2 VIEW COMPARISON:  04/27/2021 FINDINGS: Heart size within normal limits. Known thoracic aortic aneurysm with extensive atherosclerotic calcification. Stable mediastinal contours. No focal airspace consolidation, pleural effusion, or pneumothorax. Multilevel degenerative spondylosis of the thoracic spine. Degenerative changes of the left shoulder. IMPRESSION: 1. No active cardiopulmonary disease. 2. Known thoracic aortic aneurysm. Electronically Signed   By: Duanne Guess D.O.   On: 01/20/2023 15:17    Pending Labs Unresulted Labs (From admission, onward)     Start     Ordered   01/21/23 0500  CBC  Daily,   R      01/20/23 1559   01/21/23 0500  Comprehensive metabolic panel  Tomorrow morning,   R        01/20/23 2037   01/21/23 0500  Magnesium  Tomorrow morning,   R        01/20/23 2037   01/20/23 2300  Basic metabolic panel  Once-Timed,   TIMED        01/20/23 2038   01/20/23 2037  Magnesium  Add-on,   AD        01/20/23 2037  Vitals/Pain Today's Vitals   01/20/23 1548 01/20/23 1600 01/20/23 1652 01/20/23 1953  BP:  (!) 136/96 (!) 142/85 138/81  Pulse:  81 81 73  Resp:  15 (!) 21 17  Temp:  97.6 F (36.4 C) 98.8 F (37.1 C) 98.5 F (36.9 C)  TempSrc:  Oral Oral Oral  SpO2:  100% 100% 100%  Weight: 59.9 kg     Height: 5\' 4"  (1.626 m)     PainSc:    0-No pain    Isolation Precautions No active isolations  Medications Medications  nitroGLYCERIN (NITROSTAT) SL tablet 0.4 mg (has no  administration in time range)  heparin ADULT infusion 100 units/mL (25000 units/225mL) (800 Units/hr Intravenous Rate/Dose Verify 01/20/23 1948)  acetaminophen (TYLENOL) tablet 650 mg (has no administration in time range)    Or  acetaminophen (TYLENOL) suppository 650 mg (has no administration in time range)  melatonin tablet 3 mg (has no administration in time range)  ondansetron (ZOFRAN) injection 4 mg (has no administration in time range)  sodium zirconium cyclosilicate (LOKELMA) packet 10 g (has no administration in time range)  aspirin chewable tablet 324 mg (324 mg Oral Given 01/20/23 1549)  heparin bolus via infusion 4,000 Units (4,000 Units Intravenous Bolus from Bag 01/20/23 1717)  furosemide (LASIX) injection 80 mg (80 mg Intravenous Given 01/20/23 2029)    Mobility walks with device     Focused Assessments Cardiac Assessment Handoff:  Cardiac Rhythm: Normal sinus rhythm Lab Results  Component Value Date   TROPONINI <0.30 05/14/2012   No results found for: "DDIMER" Does the Patient currently have chest pain? No    R Recommendations: See Admitting Provider Note  Report given to:   Additional Notes:  NA

## 2023-01-21 ENCOUNTER — Inpatient Hospital Stay (HOSPITAL_COMMUNITY): Payer: Medicare Other

## 2023-01-21 DIAGNOSIS — I5033 Acute on chronic diastolic (congestive) heart failure: Secondary | ICD-10-CM | POA: Diagnosis not present

## 2023-01-21 DIAGNOSIS — E875 Hyperkalemia: Secondary | ICD-10-CM | POA: Diagnosis present

## 2023-01-21 DIAGNOSIS — N179 Acute kidney failure, unspecified: Secondary | ICD-10-CM | POA: Diagnosis present

## 2023-01-21 LAB — COMPREHENSIVE METABOLIC PANEL
ALT: 16 U/L (ref 0–44)
AST: 20 U/L (ref 15–41)
Albumin: 3.2 g/dL — ABNORMAL LOW (ref 3.5–5.0)
Alkaline Phosphatase: 56 U/L (ref 38–126)
Anion gap: 12 (ref 5–15)
BUN: 68 mg/dL — ABNORMAL HIGH (ref 8–23)
CO2: 20 mmol/L — ABNORMAL LOW (ref 22–32)
Calcium: 9.4 mg/dL (ref 8.9–10.3)
Chloride: 102 mmol/L (ref 98–111)
Creatinine, Ser: 3.02 mg/dL — ABNORMAL HIGH (ref 0.44–1.00)
GFR, Estimated: 14 mL/min — ABNORMAL LOW (ref >60)
Glucose, Bld: 242 mg/dL — ABNORMAL HIGH (ref 70–99)
Potassium: 4 mmol/L (ref 3.5–5.1)
Sodium: 134 mmol/L — ABNORMAL LOW (ref 135–145)
Total Bilirubin: 0.5 mg/dL (ref <1.2)
Total Protein: 6.5 g/dL (ref 6.5–8.1)

## 2023-01-21 LAB — URINALYSIS, COMPLETE (UACMP) WITH MICROSCOPIC
Bacteria, UA: NONE SEEN
Bilirubin Urine: NEGATIVE
Glucose, UA: NEGATIVE mg/dL
Hgb urine dipstick: NEGATIVE
Ketones, ur: NEGATIVE mg/dL
Nitrite: NEGATIVE
Protein, ur: NEGATIVE mg/dL
Specific Gravity, Urine: 1.006 (ref 1.005–1.030)
pH: 5 (ref 5.0–8.0)

## 2023-01-21 LAB — CBC
HCT: 28.7 % — ABNORMAL LOW (ref 36.0–46.0)
Hemoglobin: 9.6 g/dL — ABNORMAL LOW (ref 12.0–15.0)
MCH: 34.8 pg — ABNORMAL HIGH (ref 26.0–34.0)
MCHC: 33.4 g/dL (ref 30.0–36.0)
MCV: 104 fL — ABNORMAL HIGH (ref 80.0–100.0)
Platelets: 137 10*3/uL — ABNORMAL LOW (ref 150–400)
RBC: 2.76 MIL/uL — ABNORMAL LOW (ref 3.87–5.11)
RDW: 13.2 % (ref 11.5–15.5)
WBC: 5.4 10*3/uL (ref 4.0–10.5)
nRBC: 0 % (ref 0.0–0.2)

## 2023-01-21 LAB — ECHOCARDIOGRAM COMPLETE
AR max vel: 1.9 cm2
AV Area VTI: 1.79 cm2
AV Area mean vel: 1.98 cm2
AV Mean grad: 5.2 mm[Hg]
AV Peak grad: 11.4 mm[Hg]
Ao pk vel: 1.68 m/s
Area-P 1/2: 5.66 cm2
Calc EF: 40.7 %
Height: 64 in
P 1/2 time: 361 ms
Radius: 0.5 cm
S' Lateral: 3.8 cm
Single Plane A2C EF: 43.5 %
Single Plane A4C EF: 36.6 %
Weight: 2216 [oz_av]

## 2023-01-21 LAB — HEMOGLOBIN A1C
Hgb A1c MFr Bld: 8.7 % — ABNORMAL HIGH (ref 4.8–5.6)
Mean Plasma Glucose: 202.99 mg/dL

## 2023-01-21 LAB — GLUCOSE, CAPILLARY
Glucose-Capillary: 240 mg/dL — ABNORMAL HIGH (ref 70–99)
Glucose-Capillary: 204 mg/dL — ABNORMAL HIGH (ref 70–99)
Glucose-Capillary: 240 mg/dL — ABNORMAL HIGH (ref 70–99)
Glucose-Capillary: 250 mg/dL — ABNORMAL HIGH (ref 70–99)
Glucose-Capillary: 196 mg/dL — ABNORMAL HIGH (ref 70–99)

## 2023-01-21 LAB — BASIC METABOLIC PANEL
Anion gap: 15 (ref 5–15)
BUN: 69 mg/dL — ABNORMAL HIGH (ref 8–23)
CO2: 19 mmol/L — ABNORMAL LOW (ref 22–32)
Calcium: 9.7 mg/dL (ref 8.9–10.3)
Chloride: 102 mmol/L (ref 98–111)
Creatinine, Ser: 2.59 mg/dL — ABNORMAL HIGH (ref 0.44–1.00)
GFR, Estimated: 17 mL/min — ABNORMAL LOW (ref >60)
Glucose, Bld: 231 mg/dL — ABNORMAL HIGH (ref 70–99)
Potassium: 4.6 mmol/L (ref 3.5–5.1)
Sodium: 136 mmol/L (ref 135–145)

## 2023-01-21 LAB — MAGNESIUM
Magnesium: 2.2 mg/dL (ref 1.7–2.4)
Magnesium: 2.3 mg/dL (ref 1.7–2.4)

## 2023-01-21 LAB — HEPARIN LEVEL (UNFRACTIONATED)
Heparin Unfractionated: 0.36 [IU]/mL (ref 0.30–0.70)
Heparin Unfractionated: 0.51 [IU]/mL (ref 0.30–0.70)

## 2023-01-21 LAB — SODIUM, URINE, RANDOM: Sodium, Ur: 62 mmol/L

## 2023-01-21 LAB — TROPONIN I (HIGH SENSITIVITY): Troponin I (High Sensitivity): 1134 ng/L (ref <18)

## 2023-01-21 LAB — CREATININE, URINE, RANDOM: Creatinine, Urine: 21 mg/dL

## 2023-01-21 MED ORDER — METOPROLOL SUCCINATE ER 50 MG PO TB24
50.0000 mg | ORAL_TABLET | Freq: Every day | ORAL | Status: DC
  Administered 2023-01-21 – 2023-01-25 (×5): 50 mg via ORAL
  Filled 2023-01-21 (×5): qty 1

## 2023-01-21 MED ORDER — INSULIN GLARGINE-YFGN 100 UNIT/ML ~~LOC~~ SOLN
6.0000 [IU] | Freq: Every day | SUBCUTANEOUS | Status: DC
  Administered 2023-01-21 – 2023-01-25 (×5): 6 [IU] via SUBCUTANEOUS
  Filled 2023-01-21 (×6): qty 0.06

## 2023-01-21 MED ORDER — DOCUSATE SODIUM 100 MG PO CAPS
100.0000 mg | ORAL_CAPSULE | Freq: Two times a day (BID) | ORAL | Status: DC
  Administered 2023-01-21 – 2023-01-26 (×11): 100 mg via ORAL
  Filled 2023-01-21 (×11): qty 1

## 2023-01-21 MED ORDER — PERFLUTREN LIPID MICROSPHERE
1.0000 mL | INTRAVENOUS | Status: AC | PRN
  Administered 2023-01-21: 2 mL via INTRAVENOUS

## 2023-01-21 MED ORDER — QUETIAPINE FUMARATE 50 MG PO TABS
200.0000 mg | ORAL_TABLET | Freq: Every day | ORAL | Status: DC
  Administered 2023-01-21 – 2023-01-25 (×5): 200 mg via ORAL
  Filled 2023-01-21 (×6): qty 4

## 2023-01-21 MED ORDER — AMLODIPINE BESYLATE 5 MG PO TABS
5.0000 mg | ORAL_TABLET | Freq: Every day | ORAL | Status: DC
  Administered 2023-01-21 – 2023-01-22 (×2): 5 mg via ORAL
  Filled 2023-01-21 (×2): qty 1

## 2023-01-21 MED ORDER — MIRTAZAPINE 15 MG PO TABS
15.0000 mg | ORAL_TABLET | Freq: Every day | ORAL | Status: DC
  Administered 2023-01-21 – 2023-01-25 (×5): 15 mg via ORAL
  Filled 2023-01-21 (×6): qty 1

## 2023-01-21 MED ORDER — POLYVINYL ALCOHOL 1.4 % OP SOLN: 1.0000 [drp] | OPHTHALMIC | Status: DC | PRN

## 2023-01-21 MED ORDER — ISOSORBIDE MONONITRATE ER 30 MG PO TB24
30.0000 mg | ORAL_TABLET | Freq: Every day | ORAL | Status: DC
  Administered 2023-01-21 – 2023-01-22 (×2): 30 mg via ORAL
  Filled 2023-01-21 (×2): qty 1

## 2023-01-21 MED ORDER — POLYETHYLENE GLYCOL 3350 17 G PO PACK
17.0000 g | PACK | Freq: Two times a day (BID) | ORAL | Status: DC
  Administered 2023-01-22 – 2023-01-24 (×5): 17 g via ORAL
  Filled 2023-01-21 (×9): qty 1

## 2023-01-21 MED ORDER — ASPIRIN 81 MG PO CHEW
81.0000 mg | CHEWABLE_TABLET | Freq: Every day | ORAL | Status: DC
  Administered 2023-01-21 – 2023-01-26 (×6): 81 mg via ORAL
  Filled 2023-01-21 (×6): qty 1

## 2023-01-21 MED ORDER — ATORVASTATIN CALCIUM 40 MG PO TABS
40.0000 mg | ORAL_TABLET | Freq: Every day | ORAL | Status: DC
  Administered 2023-01-21 – 2023-01-25 (×5): 40 mg via ORAL
  Filled 2023-01-21 (×5): qty 1

## 2023-01-21 NOTE — Progress Notes (Signed)
PROGRESS NOTE    Karen [name]  MVH:[id number] DOB: November 15, 1928 DOA: 01/20/2023 PCP: Irena Reichmann, DO  Chief Complaint  Patient presents with   Shortness of Breath    Brief Narrative:    Karen [name] is Karen [name] 89 y.o. female with medical history significant for chronic diastolic heart failure, type 2 diabetes mellitus, essential hypertension, CKD stage IV associated baseline creatinine 1.5-1.8, chronic macrocytic anemia with baseline hemoglobin range 10-12, who is admitted to [facility] on 01/20/2023 with acute on chronic diastolic heart failure after presenting from home to [facility] ED complaining of shortness of breath.  Now found to have NSTEMI, cardiology following.   Assessment & Plan:   Principal Problem:   Acute on chronic diastolic heart failure (HCC) Active Problems:   DM2 (diabetes mellitus, type 2) (HCC)   Essential hypertension   Elevated troponin   Macrocytic anemia   Acute renal failure superimposed on stage 4 chronic kidney disease (HCC)   Hyperkalemia  NSTEMI  Shortness of Breath  Coronary Artery Disease CT cardiac perfusion PET with findings concerning for L main or 3 vessel CAD due to drop in EF and TID despite normal perfusion imaging Troponin rising to 1,134 Appreciate cardiology recs -> heparin gtt, aspirin, lipitor, metoprolol.  We're starting imdur 30 mg daily.  Pending echo  AKI on CKD IV Baseline creatinine around 1.6-1.7 At presentation, creatinine 2.79 -> worsened to 3 after diuresis Renal US with chronic medical renal disease UA bland Will trend creatinine Hold home lasix and valstartan  Hypertension Amlodipine Holding lasix (3 times weekly) and valstartan  T2DM Glipizide and metformin on hold, SSI  Will add semglee  Dyslipidemia Lipitor  Insomnia Remeron, seroquel     DVT prophylaxis: heparin gtt Code Status: full Family Communication: son's at bedside Disposition:   Status is: Inpatient Remains inpatient appropriate  because: need for continued inpatient care   Consultants:  cardiology  Procedures:  Pending echo  Antimicrobials:  Anti-infectives (From admission, onward)    None       Subjective: No new complaints 2 sons at bedside  Objective: Vitals:   01/20/23 2250 01/21/23 0333 01/21/23 0727 01/21/23 0944  BP:  133/61 124/67 (!) 102/58  Pulse:  71  80  Resp:  18 16   Temp:  97.9 F (36.6 C) 98.4 F (36.9 C)   TempSrc:  Oral Oral   SpO2:  97%    Weight:  62.8 kg    Height: 5\' 4"  (1.626 m)       Intake/Output Summary (Last 24 hours) at 01/21/2023 1620 Last data filed at 01/21/2023 1322 Gross per 24 hour  Intake 120 ml  Output 1300 ml  Net -1180 ml   Filed Weights   01/20/23 1548 01/21/23 0333  Weight: 59.9 kg 62.8 kg    Examination:  General exam: sharp 89 yo in NAD  Respiratory system: Clear to auscultation. Respiratory effort normal. Cardiovascular system: RRR Gastrointestinal system: Abdomen is nondistended, soft and nontender.  Central nervous system: Alert and oriented. No focal neurological deficits. Extremities: no LEE   Data Reviewed: I have personally reviewed following labs and imaging studies  CBC: Recent Labs  Lab 01/20/23 1400 01/21/23 0442  WBC 8.1 5.4  NEUTROABS 6.6  --   HGB 10.4* 9.6*  HCT 32.1* 28.7*  MCV 109.6* 104.0*  PLT 115* 137*    Basic Metabolic Panel: Recent Labs  Lab 01/20/23 1400 01/20/23 2333 01/21/23 0442  NA 132* 136 134*  K 5.4* 4.6 4.0  CL 101 102 102  CO2 17* 19* 20*  GLUCOSE 374* 231* 242*  BUN 67* 69* 68*  CREATININE 2.79* 2.59* 3.02*  CALCIUM 9.8 9.7 9.4  MG  --  2.3 2.2    GFR: Estimated Creatinine Clearance: 10.3 mL/min (Karen [name]) (by C-G formula based on SCr of 3.02 mg/dL (H)).  Liver Function Tests: Recent Labs  Lab 01/21/23 0442  AST 20  ALT 16  ALKPHOS 56  BILITOT 0.5  PROT 6.5  ALBUMIN 3.2*    CBG: Recent Labs  Lab 01/20/23 2236 01/21/23 0734 01/21/23 1210  GLUCAP 240* 204* 240*      No results found for this or any previous visit (from the past 240 hours).       Radiology Studies: US RENAL Result Date: 01/21/2023 CLINICAL DATA:  [id number] AKI (acute kidney injury) (HCC) [id number] EXAM: RENAL / URINARY TRACT ULTRASOUND COMPLETE COMPARISON:  None Available. FINDINGS: Right Kidney: Renal measurements: 8.9 x 3.9 x 4.7 cm = volume: 84 mL. Increased renal cortical echogenicity. 1.3 cm simple cyst which requires no follow-up imaging. No solid mass, shadowing stone, or hydronephrosis visualized. Left Kidney: Renal measurements: 7.5 x 4.3 x 4.2 cm = volume: 72 mL. Increased renal cortical echogenicity. Several simple appearing left renal cysts, which require no follow-up imaging. No solid mass, shadowing stone, or hydronephrosis visualized. Bladder: Appears normal for degree of bladder distention. Other: None. IMPRESSION: 1. No hydronephrosis. 2. Increased renal cortical echogenicity bilaterally, suggesting chronic medical renal disease. Electronically Signed   By: Duanne Guess D.O.   On: 01/21/2023 13:26   DG Chest 2 View Result Date: 01/20/2023 CLINICAL DATA:  Shortness of breath EXAM: CHEST - 2 VIEW COMPARISON:  04/27/2021 FINDINGS: Heart size within normal limits. Known thoracic aortic aneurysm with extensive atherosclerotic calcification. Stable mediastinal contours. No focal airspace consolidation, pleural effusion, or pneumothorax. Multilevel degenerative spondylosis of the thoracic spine. Degenerative changes of the left shoulder. IMPRESSION: 1. No active cardiopulmonary disease. 2. Known thoracic aortic aneurysm. Electronically Signed   By: Duanne Guess D.O.   On: 01/20/2023 15:17        Scheduled Meds:  amLODipine  5 mg Oral Daily   aspirin  81 mg Oral Daily   atorvastatin  40 mg Oral QHS   docusate sodium  100 mg Oral BID   insulin aspart  0-5 Units Subcutaneous QHS   insulin aspart  0-9 Units Subcutaneous TID WC   isosorbide mononitrate  30 mg Oral Daily    metoprolol succinate  50 mg Oral Daily   mirtazapine  15 mg Oral QHS   polyethylene glycol  17 g Oral BID   QUEtiapine  200 mg Oral QHS   Continuous Infusions:  heparin 800 Units/hr (01/20/23 1948)     LOS: 1 day    Time spent: over 30 min     Lacretia Nicks, MD [facility]   To contact the attending provider between 7A-7P or the covering provider during after hours 7P-7A, please log into the web site [email or web address] and access using universal  password for that web site. If you do not have the password, please call the hospital operator.  01/21/2023, 4:20 PM

## 2023-01-21 NOTE — Plan of Care (Signed)

## 2023-01-21 NOTE — Progress Notes (Addendum)
Patient Name: Karen [name] Date of Encounter: 01/21/2023 [facility] Cardiologist: Thurmon Fair, MD   Interval Summary  .    Reports not being able to sleep at all.  She states that she did not get her Seroquel or Remeron yesterday which help her sleep.  States that shortness of breath has improved.  Vital Signs .    Vitals:   01/20/23 2100 01/20/23 2250 01/21/23 0333 01/21/23 0727  BP: 127/73  133/61 124/67  Pulse: 72  71   Resp: 14  18 16   Temp:   97.9 F (36.6 C) 98.4 F (36.9 C)  TempSrc:   Oral Oral  SpO2: 99%  97%   Weight:   62.8 kg   Height:  5\' 4"  (1.626 m)      Intake/Output Summary (Last 24 hours) at 01/21/2023 0821 Last data filed at 01/21/2023 0351 Gross per 24 hour  Intake 120 ml  Output 1000 ml  Net -880 ml      01/21/2023    3:33 AM 01/20/2023    3:48 PM 10/20/2022    2:53 PM  Last 3 Weights  Weight (lbs) 138 lb 8 oz 132 lb 135 lb 9.6 oz  Weight (kg) 62.823 kg 59.875 kg 61.508 kg      Telemetry/ECG    Sinus rhythm-70s personally Reviewed  CV Studies    Cardiac PET 06/30/2021 1. Stable 4.8 cm thoracic aortic aneurysm. Ascending thoracic aortic aneurysm. Recommend semi-annual imaging followup by CTA or MRA and referral to cardiothoracic surgery if not already obtained. This recommendation follows 2010 ACCF/AHA/AATS/ACR/ASA/SCA/SCAI/SIR/STS/SVM Guidelines for the diagnosis and Management of Patients With Thoracic Aortic Disease. Circulation. 2010; 121: Z610-R604 2. Aortic aneurysm NOS (ICD10-I71.9). Aortic Atherosclerosis (ICD10-I70.0).  Echocardiogram 04/30/2021 . Distal septal hypokinesis . Left ventricular ejection fraction, by  estimation, is 50 to 55%. The left ventricle has low normal function. The  left ventricle demonstrates regional wall motion abnormalities (see  scoring diagram/findings for  description). There is mild asymmetric left ventricular hypertrophy of the  basal and septal segments. Left ventricular  diastolic parameters are  indeterminate.   2. Right ventricular systolic function is normal. The right ventricular  size is normal.   3. Left atrial size was mildly dilated.   4. The mitral valve is degenerative. Trivial mitral valve regurgitation.  No evidence of mitral stenosis. Moderate mitral annular calcification.   5. The aortic valve is tricuspid. There is moderate calcification of the  aortic valve. There is moderate thickening of the aortic valve. Aortic  valve regurgitation is trivial. Aortic valve sclerosis/calcification is  present, without any evidence of  aortic stenosis.   6. The inferior vena cava is normal in size with greater than 50%  respiratory variability, suggesting right atrial pressure of 3 mmHg.     Physical Exam .   GEN: No acute distress.   Neck: + JVD Cardiac: RRR, no murmurs, rubs, or gallops.  Respiratory: Clear to auscultation bilaterally. GI: Soft, nontender, non-distended  MS: No edema  Patient Profile    Karen [name] is a 89 y.o. female has hx of   coronary artery disease (2015 cath with 30-40% distal left main stenosis, 40% proximal AV stenosis, 90% mid and distal LAD stenosis, 70-90% mid-distal left circumflex stenosis, 40-50% mid RCA stenosis; status post PCI-DES to LAD and circumflex), history of chronic diastolic heart failure (improved after treatment of coronary disease), hypertension and an ascending aortic aneurysm last measured (June 2019) at 4.7 cm in maximum diameter, CKD,  DM  and admitted on 01/20/2023 for the evaluation of NSTEMI  Assessment & Plan .     NSTEMI  CAD Difficult situation. She has had abnormal PET scan concerning for multivessel CAD.  Her anatomy not likely amenable to PCI and she is not a candidate for CABG.  Additionally previous catheterization was difficult given inadequate radial artery and significant groin hemorrhage.  Medical management has been recommended with strong reluctance to cath.  Her anginal  equivalent seems to be shortness of breath that she has never had chest pain.  Reports significant interference with ADLs with chest pain and fatigue.  EKG showed subtle ST elevation in V1 with t wave inversions.  Troponins have gone up to [phone].  Will continue with medical management Continue IV heparin for 48 hours, managed per pharmacy Will add back her aspirin, she is on atorvastatin 40 mg, Toprol-XL 50 mg.  Previously has not responded to nitroglycerin.  Could consider up titration of beta-blocker versus trialing Imdur, not sure how much this will help since her main complaints is fatigue/SOB.  Chronic HFpEF She was given 1 dose of IV Lasix 80 yesterday.  Does not look volume up, slightly dry and had significant increase in creatinine 2.59-3.02.  I think her shortness of breath is more representative of her CAD rather than volume.  Only -1L. Hold diuresis for now. With her underlying renal disease.  Valsartan may not be great.  Continue to hold.  CKD Hyperkalemia has resolved.  Creatinine 3.02, needs to get established with nephrology  Thoracic AAA 4.8cm Stable.  Continue to monitor blood pressure closely.  No plans for intervention   Hypertension BP looks good today.  She is on amlodipine 5 mg Toprol-XL as above.  Can uptitrate amlodipine to 10 mg if needed.  For questions or updates, please contact [facility] Please consult [email or web address] for contact info under        Signed, Abagail Kitchens, PA-C    Personally seen and examined. Agree with above.  Since creatinine increased from 2.5 up to 3.0 we will hold on Lasix for now.  Not likely fluid overloaded as was expected. -We will trial isosorbide 30 mg once a day.  Antianginal.  Normally she states nitroglycerin does not help her symptoms.  Symptoms are in room.  1 son had a full scholarship to [employer], stayed in [place].  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 - Continue with IV heparin today.   Donato Schultz, MD

## 2023-01-21 NOTE — Inpatient Diabetes Management (Signed)
Inpatient Diabetes Program Recommendations  AACE/ADA: New Consensus Statement on Inpatient Glycemic Control (2015)  Target Ranges:  Prepandial:   less than 140 mg/dL      Peak postprandial:   less than 180 mg/dL (1-2 hours)      Critically ill patients:  140 - 180 mg/dL   Lab Results  Component Value Date   GLUCAP 240 (H) 01/21/2023   HGBA1C 8.7 (H) 01/20/2023    Review of Glycemic Control  Latest Reference Range & Units 01/20/23 22:36 01/21/23 07:34 01/21/23 12:10  Glucose-Capillary 70 - 99 mg/dL 865 (H) 784 (H) 696 (H)   Diabetes history: DM 2 Outpatient Diabetes medications:  Metformin 1000 mg bid Glipizide XL 10 mg bid Current orders for Inpatient glycemic control:  Novolog 0-9 units tid with meals and HS  Inpatient Diabetes Program Recommendations:    Note patient with hx. Of DM.  May consider adding Semglee 6 units daily while in the hospital.  Consider avoiding sulfonylureas and metformin at d/c due to impaired renal function and age.   Thanks,  Beryl Meager, RN, BC-ADM Inpatient Diabetes Coordinator Pager [phone]  (8a-5p)

## 2023-01-21 NOTE — Plan of Care (Signed)

## 2023-01-21 NOTE — Progress Notes (Signed)
PHARMACY - ANTICOAGULATION CONSULT NOTE  Pharmacy Consult for Heparin Indication: chest pain/ACS  Allergies  Allergen Reactions   Lisinopril Cough    Ace inhibitor   Pneumococcal Vaccines Swelling and Other (See Comments)    Very bad pain and swelling in the arm of the shot, needed 2 cortisone shots.   Antihistamines, Diphenhydramine-Type Other (See Comments)   Carvedilol Other (See Comments)    Weakness, dizziness, upset stomach, trembling    Codeine Nausea And Vomiting   Morphine And Codeine Nausea And Vomiting   Ultram [Tramadol Hcl] Itching    Patient Measurements: Height: 5\' 4"  (162.6 cm) Weight: 62.8 kg (138 lb 8 oz) IBW/kg (Calculated) : 54.7 Heparin Dosing Weight: 59.9 kg  Vital Signs: Temp: 98.4 F (36.9 C) (12/13 0727) Temp Source: Oral (12/13 0727) BP: 102/58 (12/13 0944) Pulse Rate: 80 (12/13 0944)  Labs: Recent Labs    01/20/23 1400 01/20/23 1540 01/20/23 2333 01/21/23 0442  HGB 10.4*  --   --  9.6*  HCT 32.1*  --   --  28.7*  PLT 115*  --   --  137*  HEPARINUNFRC  --   --  0.51 0.36  CREATININE 2.79*  --  2.59* 3.02*  TROPONINIHS 481* 838*  --  1,134*    Estimated Creatinine Clearance: 10.3 mL/min (A) (by C-G formula based on SCr of 3.02 mg/dL (H)).   Medical History: Past Medical History:  Diagnosis Date   Arthritis    Ascending aortic aneurysm (HCC)    Chronic diastolic CHF (congestive heart failure) (HCC)    Constipation    Coronary artery disease 04/2013   Three-vessel disease, initial medical therapy then stenting of the LAD and CFX 10/2013   Diabetes mellitus    TYPE 2   Fibromyalgia    H/O: gout    Heart murmur    Hx: UTI (urinary tract infection)    Hypertension     Assessment: 89 yo female here with SOB and noted with NSTEMI and HFpEF. She is on heparin IV with plans for medical management  -heparin level at goal on 800 units/hr -hg low/stable -Noted plans for 48hrs heparin (started 12/12 ~ 5pm)   Goal of Therapy:   Heparin level 0.3-0.7 units/ml Monitor platelets by anticoagulation protocol: Yes   Plan:  Cont heparin 800 units/hr Daily heparin level and CBC  Harland German, PharmD Clinical Pharmacist **Pharmacist phone directory can now be found on [email or web address] ([email or web address]).  Listed under [facility].

## 2023-01-21 NOTE — Plan of Care (Signed)
  Problem: Education: Goal: Ability to describe self-care measures that may prevent or decrease complications (Diabetes Survival Skills Education) will improve 01/21/2023 1951 by Sanda Linger, RN Outcome: Progressing 01/21/2023 1939 by Sanda Linger, RN Outcome: Progressing Goal: Individualized Educational Video(s) 01/21/2023 1951 by Sanda Linger, RN Outcome: Progressing 01/21/2023 1939 by Sanda Linger, RN Outcome: Progressing   Problem: Coping: Goal: Ability to adjust to condition or change in health will improve 01/21/2023 1951 by Sanda Linger, RN Outcome: Progressing 01/21/2023 1939 by Sanda Linger, RN Outcome: Progressing   Problem: Fluid Volume: Goal: Ability to maintain a balanced intake and output will improve 01/21/2023 1951 by Sanda Linger, RN Outcome: Progressing 01/21/2023 1939 by Sanda Linger, RN Outcome: Progressing   Problem: Health Behavior/Discharge Planning: Goal: Ability to identify and utilize available resources and services will improve 01/21/2023 1951 by Sanda Linger, RN Outcome: Progressing 01/21/2023 1939 by Sanda Linger, RN Outcome: Progressing Goal: Ability to manage health-related needs will improve 01/21/2023 1951 by Sanda Linger, RN Outcome: Progressing 01/21/2023 1939 by Sanda Linger, RN Outcome: Progressing   Problem: Metabolic: Goal: Ability to maintain appropriate glucose levels will improve 01/21/2023 1951 by Sanda Linger, RN Outcome: Progressing 01/21/2023 1939 by Sanda Linger, RN Outcome: Progressing   Problem: Nutritional: Goal: Maintenance of adequate nutrition will improve Outcome: Progressing

## 2023-01-21 NOTE — Progress Notes (Signed)
PHARMACY - ANTICOAGULATION CONSULT NOTE  Pharmacy Consult for Heparin Indication: chest pain/ACS  Allergies  Allergen Reactions   Lisinopril Cough    Ace inhibitor   Pneumococcal Vaccines Swelling and Other (See Comments)    Very bad pain and swelling in the arm of the shot, needed 2 cortisone shots.   Antihistamines, Diphenhydramine-Type Other (See Comments)   Carvedilol Other (See Comments)    Weakness, dizziness, upset stomach, trembling    Codeine Nausea And Vomiting   Morphine And Codeine Nausea And Vomiting   Ultram [Tramadol Hcl] Itching    Patient Measurements: Height: 5\' 4"  (162.6 cm) Weight: 59.9 kg (132 lb) IBW/kg (Calculated) : 54.7 Heparin Dosing Weight: 59.9 kg  Vital Signs: Temp: 98.5 F (36.9 C) (12/12 1953) Temp Source: Oral (12/12 1953) BP: 127/73 (12/12 2100) Pulse Rate: 72 (12/12 2100)  Labs: Recent Labs    01/20/23 1400 01/20/23 1540 01/20/23 2333  HGB 10.4*  --   --   HCT 32.1*  --   --   PLT 115*  --   --   HEPARINUNFRC  --   --  0.51  CREATININE 2.79*  --  2.59*  TROPONINIHS 481* 838*  --     Estimated Creatinine Clearance: 12 mL/min (A) (by C-G formula based on SCr of 2.59 mg/dL (H)).   Medical History: Past Medical History:  Diagnosis Date   Arthritis    Ascending aortic aneurysm (HCC)    Chronic diastolic CHF (congestive heart failure) (HCC)    Constipation    Coronary artery disease 04/2013   Three-vessel disease, initial medical therapy then stenting of the LAD and CFX 10/2013   Diabetes mellitus    TYPE 2   Fibromyalgia    H/O: gout    Heart murmur    Hx: UTI (urinary tract infection)    Hypertension     Assessment: Active Problem(s): SOB and weakness x several days esp when walking and lying down. Must sleep on several pillow.  - BNP 1734 and Troponin 481  AC/Heme: CP/ACS with elevated troponin. No anticoagulation PTA - Hgb 10.4 (at baseline). Plts 115 significantly lower than 2023.  12/13 AM update:  Heparin  level therapeutic   Goal of Therapy:  Heparin level 0.3-0.7 units/ml Monitor platelets by anticoagulation protocol: Yes   Plan:  Cont heparin 800 units/hr Heparin level with AM labs  Abran Duke, PharmD, BCPS Clinical Pharmacist Phone: [phone]

## 2023-01-22 DIAGNOSIS — K219 Gastro-esophageal reflux disease without esophagitis: Secondary | ICD-10-CM | POA: Insufficient documentation

## 2023-01-22 DIAGNOSIS — I214 Non-ST elevation (NSTEMI) myocardial infarction: Secondary | ICD-10-CM | POA: Diagnosis not present

## 2023-01-22 DIAGNOSIS — E785 Hyperlipidemia, unspecified: Secondary | ICD-10-CM

## 2023-01-22 DIAGNOSIS — I5043 Acute on chronic combined systolic (congestive) and diastolic (congestive) heart failure: Secondary | ICD-10-CM

## 2023-01-22 DIAGNOSIS — E876 Hypokalemia: Secondary | ICD-10-CM

## 2023-01-22 DIAGNOSIS — I10 Essential (primary) hypertension: Secondary | ICD-10-CM | POA: Diagnosis not present

## 2023-01-22 DIAGNOSIS — I5022 Chronic systolic (congestive) heart failure: Secondary | ICD-10-CM | POA: Insufficient documentation

## 2023-01-22 DIAGNOSIS — N170 Acute kidney failure with tubular necrosis: Secondary | ICD-10-CM | POA: Diagnosis not present

## 2023-01-22 DIAGNOSIS — E1169 Type 2 diabetes mellitus with other specified complication: Secondary | ICD-10-CM

## 2023-01-22 DIAGNOSIS — E871 Hypo-osmolality and hyponatremia: Secondary | ICD-10-CM | POA: Insufficient documentation

## 2023-01-22 LAB — CBC
HCT: 27.2 % — ABNORMAL LOW (ref 36.0–46.0)
Hemoglobin: 9.1 g/dL — ABNORMAL LOW (ref 12.0–15.0)
MCH: 35 pg — ABNORMAL HIGH (ref 26.0–34.0)
MCHC: 33.5 g/dL (ref 30.0–36.0)
MCV: 104.6 fL — ABNORMAL HIGH (ref 80.0–100.0)
Platelets: 109 10*3/uL — ABNORMAL LOW (ref 150–400)
RBC: 2.6 MIL/uL — ABNORMAL LOW (ref 3.87–5.11)
RDW: 13.1 % (ref 11.5–15.5)
WBC: 5.8 10*3/uL (ref 4.0–10.5)
nRBC: 0 % (ref 0.0–0.2)

## 2023-01-22 LAB — BASIC METABOLIC PANEL
Anion gap: 13 (ref 5–15)
BUN: 68 mg/dL — ABNORMAL HIGH (ref 8–23)
CO2: 22 mmol/L (ref 22–32)
Calcium: 9.3 mg/dL (ref 8.9–10.3)
Chloride: 99 mmol/L (ref 98–111)
Creatinine, Ser: 3.43 mg/dL — ABNORMAL HIGH (ref 0.44–1.00)
GFR, Estimated: 12 mL/min — ABNORMAL LOW (ref >60)
Glucose, Bld: 261 mg/dL — ABNORMAL HIGH (ref 70–99)
Potassium: 3.4 mmol/L — ABNORMAL LOW (ref 3.5–5.1)
Sodium: 134 mmol/L — ABNORMAL LOW (ref 135–145)

## 2023-01-22 LAB — GLUCOSE, CAPILLARY
Glucose-Capillary: 293 mg/dL — ABNORMAL HIGH (ref 70–99)
Glucose-Capillary: 186 mg/dL — ABNORMAL HIGH (ref 70–99)
Glucose-Capillary: 131 mg/dL — ABNORMAL HIGH (ref 70–99)
Glucose-Capillary: 143 mg/dL — ABNORMAL HIGH (ref 70–99)

## 2023-01-22 LAB — HEPARIN LEVEL (UNFRACTIONATED)
Heparin Unfractionated: 0.35 [IU]/mL (ref 0.30–0.70)
Heparin Unfractionated: 0.15 [IU]/mL — ABNORMAL LOW (ref 0.30–0.70)
Heparin Unfractionated: 0.33 [IU]/mL (ref 0.30–0.70)

## 2023-01-22 MED ORDER — SODIUM CHLORIDE 0.9 % IV SOLN: INTRAVENOUS | Status: DC

## 2023-01-22 MED ORDER — PANTOPRAZOLE SODIUM 40 MG PO TBEC
40.0000 mg | DELAYED_RELEASE_TABLET | Freq: Every day | ORAL | Status: DC
  Administered 2023-01-22 – 2023-01-24 (×3): 40 mg via ORAL
  Filled 2023-01-22 (×4): qty 1

## 2023-01-22 MED ORDER — AMLODIPINE BESYLATE 2.5 MG PO TABS
2.5000 mg | ORAL_TABLET | Freq: Every day | ORAL | Status: DC
  Administered 2023-01-23: 2.5 mg via ORAL
  Filled 2023-01-22: qty 1

## 2023-01-22 NOTE — Progress Notes (Signed)
   Patient Name: Karen [name] Date of Encounter: 01/22/2023 [facility] Cardiologist: Thurmon Fair, MD   Interval Summary  .    Asymptomatic at rest.  Able to lie fully horizontally in bed.  Became short of breath just with standing up, however.  Does not have angina (angina has never been a prominent complaint). Feels much better today after having a good night sleep.  Vital Signs .    Vitals:   01/21/23 1626 01/21/23 1920 01/22/23 0506 01/22/23 0757  BP:  108/60 (!) 108/57 108/61  Pulse: 81 77 83 85  Resp: 16 18 16 16   Temp: 99.2 F (37.3 C) 98 F (36.7 C) 97.6 F (36.4 C) 97.7 F (36.5 C)  TempSrc: Oral Oral Oral Oral  SpO2: 100% 100% 100% 100%  Weight:   60.8 kg   Height:        Intake/Output Summary (Last 24 hours) at 01/22/2023 0926 Last data filed at 01/22/2023 9629 Gross per 24 hour  Intake --  Output 500 ml  Net -500 ml      01/22/2023    5:06 AM 01/21/2023    3:33 AM 01/20/2023    3:48 PM  Last 3 Weights  Weight (lbs) 134 lb 1.6 oz 138 lb 8 oz 132 lb  Weight (kg) 60.827 kg 62.823 kg 59.875 kg      Telemetry/ECG    Normal sinus rhythm- Personally Reviewed  Echocardiogram shows significant reduction in LV function compared to last year, EF 35-40% with regional wall motion abnormalities (aneurysmal apex).  There is moderate (rather than mild) mitral insufficiency with a central jet.  Known aneurysm of the ascending aorta at 4.8 cm.  Based on the echo findings, left atrial filling pressures are elevated  Physical Exam .   GEN: No acute distress.   Neck: No JVD Cardiac: RRR, no murmurs, rubs, or gallops.  Respiratory: Clear to auscultation bilaterally. GI: Soft, nontender, non-distended  MS: No edema  Assessment & Plan .      Echo findings suggest interval occlusion of the mid-distal LAD artery stent.  Aneurysmal wall motion in this territory suggest there is probably no viable myocardium.  LVEF has dropped considerably. Labs are  not available today, but she has evidence of acute kidney injury, with creatinine markedly worsened compared to last year (baseline creatinine 1.7, GFR 30, now creatinine 3.0, GFR 15). At least for now, continue with medical management.  The likelihood of acute renal failure with contrast based procedures probably exceeds any benefit we would obtain by revascularizing the LAD artery. Continue with IV heparin through tomorrow morning. We may be able to gently increase long-acting nitrates. Blood pressure probably does not allow higher beta-blocker dose. She relies on Seroquel and Remeron for sleep and has done so for years.  Unable to use Ranexa due to risk of QT interval prolongation. Consider adding an SGLT2 inhibitor.  Wait for stabilization of renal function.  For questions or updates, please contact [facility] Please consult [email or web address] for contact info under        Signed, Thurmon Fair, MD

## 2023-01-22 NOTE — Progress Notes (Signed)
Progress Note   Patient: Karen [name] ZDG:[id number] DOB: [birth year] DOA: 01/20/2023     2 DOS: the patient was seen and examined on 01/22/2023   Brief hospital course: 89 year old female past medical history of chronic diastolic congestive heart failure, type 2 diabetes mellitus, essential hypertension, chronic kidney disease stage IV, macrocytic anemia presented with shortness of breath and diagnosed with NSTEMI.  12/14.  Creatinine worsening today to 3.43.  Will start gentle fluid.  Assessment and Plan: * NSTEMI (non-ST elevated myocardial infarction) (HCC) Last troponin 1134.  Medical management with her elevated creatinine.  Continue heparin drip, aspirin, Lipitor, low-dose Toprol.  Cardiology following.  Echocardiogram with an EF of 35%.  Acute renal failure superimposed on stage 4 chronic kidney disease (HCC) Creatinine 2.79 on presentation and today's creatinine up at 3.43.  Gentle IV fluid hydration.  Type 2 diabetes mellitus with hyperlipidemia (HCC) Also with hyperglycemia.  Continue Lipitor.  Check a lipid profile.  Patient on 6 units of long-acting insulin and sliding scale.  Last hemoglobin A1c 8.7.  Essential hypertension Blood pressure on the lower side.  On Norvasc and Toprol.  Will cut back the dose of Norvasc for tomorrow.  GERD (gastroesophageal reflux disease) Will start Protonix.  Chronic systolic CHF (congestive heart failure) (HCC) EF 35%.  No signs of heart failure currently.  Careful with fluids.  Hyponatremia Sodium 134.  Macrocytic anemia Last hemoglobin 9.1.  Vitamin B12 low in 2023 will recheck again tomorrow.  Hypokalemia Potassium 3.4        Subjective: Patient does not have any chest pain.  Did have shortness of breath.  Her main complaint was shortness of breath.  Does complain of difficulty swallowing solids at times.  Admitted with NSTEMI.  Physical Exam: Vitals:   01/21/23 1626 01/21/23 1920 01/22/23 0506 01/22/23 0757  BP:   108/60 (!) 108/57 108/61  Pulse: 81 77 83 85  Resp: 16 18 16 16   Temp: 99.2 F (37.3 C) 98 F (36.7 C) 97.6 F (36.4 C) 97.7 F (36.5 C)  TempSrc: Oral Oral Oral Oral  SpO2: 100% 100% 100% 100%  Weight:   60.8 kg   Height:       Physical Exam HENT:     Head: Normocephalic.     Mouth/Throat:     Pharynx: No oropharyngeal exudate.  Eyes:     General: Lids are normal.     Conjunctiva/sclera: Conjunctivae normal.  Cardiovascular:     Rate and Rhythm: Normal rate and regular rhythm.     Heart sounds: Normal heart sounds, S1 normal and S2 normal.  Pulmonary:     Breath sounds: No decreased breath sounds, wheezing, rhonchi or rales.  Abdominal:     Palpations: Abdomen is soft.     Tenderness: There is no abdominal tenderness.  Musculoskeletal:     Right lower leg: No swelling.     Left lower leg: No swelling.  Skin:    General: Skin is warm.     Findings: No rash.  Neurological:     Mental Status: She is alert and oriented to person, place, and time.     Data Reviewed: Sodium 134, potassium 3.4, creatinine 3.43, hemoglobin 9.1, platelet count 109  Family Communication: Family at bedside  Disposition: Status is: Inpatient Remains inpatient appropriate because: Medical management for NSTEMI.  Patient also has acute kidney injury on top of her chronic kidney disease.  Planned Discharge Destination: Home    Time spent: 28 minutes  Author: Alford Highland,  MD 01/22/2023 3:09 PM  For on call review [email or web address].

## 2023-01-22 NOTE — Assessment & Plan Note (Addendum)
Potassium 3.4

## 2023-01-22 NOTE — Assessment & Plan Note (Signed)
 Will start Protonix.

## 2023-01-22 NOTE — Progress Notes (Addendum)
PHARMACY - ANTICOAGULATION CONSULT NOTE  Pharmacy Consult for Heparin Indication: chest pain/ACS  Allergies  Allergen Reactions   Lisinopril Cough    Ace inhibitor   Pneumococcal Vaccines Swelling and Other (See Comments)    Very bad pain and swelling in the arm of the shot, needed 2 cortisone shots.   Antihistamines, Diphenhydramine-Type Other (See Comments)   Carvedilol Other (See Comments)    Weakness, dizziness, upset stomach, trembling    Codeine Nausea And Vomiting   Morphine And Codeine Nausea And Vomiting   Ultram [Tramadol Hcl] Itching    Patient Measurements: Height: 5\' 4"  (162.6 cm) Weight: 60.8 kg (134 lb 1.6 oz) IBW/kg (Calculated) : 54.7 Heparin Dosing Weight: 59.9 kg  Vital Signs: Temp: 97.7 F (36.5 C) (12/14 0757) Temp Source: Oral (12/14 0757) BP: 108/61 (12/14 0757) Pulse Rate: 85 (12/14 0757)  Labs: Recent Labs    01/20/23 1400 01/20/23 1540 01/20/23 2333 01/21/23 0442 01/22/23 0301  HGB 10.4*  --   --  9.6* 9.1*  HCT 32.1*  --   --  28.7* 27.2*  PLT 115*  --   --  137* 109*  HEPARINUNFRC  --   --  0.51 0.36 0.35  CREATININE 2.79*  --  2.59* 3.02*  --   TROPONINIHS 481* 838*  --  1,134*  --     Estimated Creatinine Clearance: 10.3 mL/min (A) (by C-G formula based on SCr of 3.02 mg/dL (H)).   Medical History: Past Medical History:  Diagnosis Date   Arthritis    Ascending aortic aneurysm (HCC)    Chronic diastolic CHF (congestive heart failure) (HCC)    Constipation    Coronary artery disease 04/2013   Three-vessel disease, initial medical therapy then stenting of the LAD and CFX 10/2013   Diabetes mellitus    TYPE 2   Fibromyalgia    H/O: gout    Heart murmur    Hx: UTI (urinary tract infection)    Hypertension     Assessment: 89 yo female here with SOB and noted with NSTEMI and HFpEF. She is on heparin IV with plans for medical management  Heparin level therapeutic at 0.35 on 800 units/hr. Of note, RN did report that patient  stated her heparin infusion and IV site was painful all night. IV site was occluded for a few minutes and RN was able to get new access with heparin running while this was obtained. Since heparin on lower end of therapeutic, will spot check a heparin level to ensure remaining therapeutic with new IV site. No bleeding reported per RN. Hgb slightly down to 9.1 and PLT count down to 109. Noted plans for 48 hours of heparin infusion, which was started 12/12 at about 5 PM.   Goal of Therapy:  Heparin level 0.3-0.7 units/ml Monitor platelets by anticoagulation protocol: Yes   Plan:  Cont heparin infusion at 800 units/hr Obtain 8 hour heparin level from last check Monitor CBC and heparin level daily while on heparin  Lennie Muckle, PharmD PGY1 Pharmacy Resident 01/22/2023 8:26 AM   Addendum 12/14 @ 12:49 RN lost IV access around 0930 this morning and heparin infusion was paused for about 20-30 minutes. Heparin infusion was resumed around 1000. Heparin level was obtained at 1100 and was subtherapeutic at 0.15, about 1 hour after heparin infusion was resumed.   Plan: Continue heparin infusion at 800 units/hr Obtain 8 hour level from when heparin infusion was resumed Follow-up plans to discontinue heparin 12/15 AM  Lennie Muckle, PharmD  PGY1 Pharmacy Resident 01/22/2023 12:50 PM

## 2023-01-22 NOTE — Assessment & Plan Note (Signed)
Last hemoglobin 9.1.  Vitamin B12 low in 2023 will recheck again tomorrow.

## 2023-01-22 NOTE — Plan of Care (Signed)

## 2023-01-22 NOTE — Hospital Course (Signed)
89 year old female past medical history of chronic diastolic congestive heart failure, type 2 diabetes mellitus, essential hypertension, chronic kidney disease stage IV, macrocytic anemia presented with shortness of breath and diagnosed with NSTEMI.  12/14.  Creatinine worsening today to 3.43.  Will start gentle fluid.

## 2023-01-22 NOTE — Assessment & Plan Note (Signed)
Last troponin 1134.  Medical management with her elevated creatinine.  Continue heparin drip, aspirin, Lipitor, low-dose Toprol.  Cardiology following.  Echocardiogram with an EF of 35%.

## 2023-01-22 NOTE — Assessment & Plan Note (Signed)
Sodium 134

## 2023-01-22 NOTE — Progress Notes (Signed)
PHARMACY - ANTICOAGULATION CONSULT NOTE  Pharmacy Consult for Heparin Indication: chest pain/ACS  Allergies  Allergen Reactions   Lisinopril Cough    Ace inhibitor   Pneumococcal Vaccines Swelling and Other (See Comments)    Very bad pain and swelling in the arm of the shot, needed 2 cortisone shots.   Antihistamines, Diphenhydramine-Type Other (See Comments)   Carvedilol Other (See Comments)    Weakness, dizziness, upset stomach, trembling    Codeine Nausea And Vomiting   Morphine And Codeine Nausea And Vomiting   Ultram [Tramadol Hcl] Itching    Patient Measurements: Height: 5\' 4"  (162.6 cm) Weight: 60.8 kg (134 lb 1.6 oz) IBW/kg (Calculated) : 54.7 Heparin Dosing Weight: 59.9 kg  Vital Signs: Temp: 98.9 F (37.2 C) (12/14 1648) Temp Source: Oral (12/14 1648) BP: 108/61 (12/14 0757) Pulse Rate: 85 (12/14 0757)  Labs: Recent Labs    01/20/23 1400 01/20/23 1400 01/20/23 1540 01/20/23 2333 01/21/23 0442 01/22/23 0301 01/22/23 0932 01/22/23 1106 01/22/23 1816  HGB 10.4*  --   --   --  9.6* 9.1*  --   --   --   HCT 32.1*  --   --   --  28.7* 27.2*  --   --   --   PLT 115*  --   --   --  137* 109*  --   --   --   HEPARINUNFRC  --    < >  --  0.51 0.36 0.35  --  0.15* 0.33  CREATININE 2.79*  --   --  2.59* 3.02*  --  3.43*  --   --   TROPONINIHS 481*  --  838*  --  1,134*  --   --   --   --    < > = values in this interval not displayed.    Estimated Creatinine Clearance: 9 mL/min (A) (by C-G formula based on SCr of 3.43 mg/dL (H)).   Medical History: Past Medical History:  Diagnosis Date   Arthritis    Ascending aortic aneurysm (HCC)    Chronic diastolic CHF (congestive heart failure) (HCC)    Constipation    Coronary artery disease 04/2013   Three-vessel disease, initial medical therapy then stenting of the LAD and CFX 10/2013   Diabetes mellitus    TYPE 2   Fibromyalgia    H/O: gout    Heart murmur    Hx: UTI (urinary tract infection)     Hypertension     Assessment: 89 yo female here with SOB and noted with NSTEMI and HFpEF. She is on heparin IV with plans for medical management  Heparin level remains therapeutic this evening at 0.33 after line issues earlier in the day.  Goal of Therapy:  Heparin level 0.3-0.7 units/ml Monitor platelets by anticoagulation protocol: Yes   Plan:  Cont heparin infusion at 800 units/hr Daily heparin level and CBC  Fredonia Highland, PharmD, [place], [facility] Clinical Pharmacist [phone] Please check AMION for all [facility] numbers 01/22/2023

## 2023-01-22 NOTE — Assessment & Plan Note (Signed)
Creatinine 2.79 on presentation and today's creatinine up at 3.43.  Gentle IV fluid hydration.

## 2023-01-22 NOTE — Assessment & Plan Note (Signed)
EF 35%.  No signs of heart failure currently.  Careful with fluids.

## 2023-01-22 NOTE — Assessment & Plan Note (Signed)
Also with hyperglycemia.  Continue Lipitor.  Check a lipid profile.  Patient on 6 units of long-acting insulin and sliding scale.  Last hemoglobin A1c 8.7.

## 2023-01-22 NOTE — Assessment & Plan Note (Addendum)
Blood pressure on the lower side.  On Norvasc and Toprol.  Will cut back the dose of Norvasc for tomorrow.

## 2023-01-23 DIAGNOSIS — I214 Non-ST elevation (NSTEMI) myocardial infarction: Secondary | ICD-10-CM

## 2023-01-23 LAB — CBC
HCT: 24.4 % — ABNORMAL LOW (ref 36.0–46.0)
HCT: 26.1 % — ABNORMAL LOW (ref 36.0–46.0)
Hemoglobin: 8.2 g/dL — ABNORMAL LOW (ref 12.0–15.0)
Hemoglobin: 8.9 g/dL — ABNORMAL LOW (ref 12.0–15.0)
MCH: 34.9 pg — ABNORMAL HIGH (ref 26.0–34.0)
MCH: 35.5 pg — ABNORMAL HIGH (ref 26.0–34.0)
MCHC: 33.6 g/dL (ref 30.0–36.0)
MCHC: 34.1 g/dL (ref 30.0–36.0)
MCV: 103.8 fL — ABNORMAL HIGH (ref 80.0–100.0)
MCV: 104 fL — ABNORMAL HIGH (ref 80.0–100.0)
Platelets: 107 10*3/uL — ABNORMAL LOW (ref 150–400)
Platelets: 118 10*3/uL — ABNORMAL LOW (ref 150–400)
RBC: 2.35 MIL/uL — ABNORMAL LOW (ref 3.87–5.11)
RBC: 2.51 MIL/uL — ABNORMAL LOW (ref 3.87–5.11)
RDW: 13.3 % (ref 11.5–15.5)
RDW: 13.2 % (ref 11.5–15.5)
WBC: 5.3 10*3/uL (ref 4.0–10.5)
WBC: 5.3 10*3/uL (ref 4.0–10.5)
nRBC: 0 % (ref 0.0–0.2)
nRBC: 0 % (ref 0.0–0.2)

## 2023-01-23 LAB — VITAMIN B12: Vitamin B-12: 214 pg/mL (ref 180–914)

## 2023-01-23 LAB — BASIC METABOLIC PANEL
Anion gap: 13 (ref 5–15)
BUN: 66 mg/dL — ABNORMAL HIGH (ref 8–23)
CO2: 20 mmol/L — ABNORMAL LOW (ref 22–32)
Calcium: 8.7 mg/dL — ABNORMAL LOW (ref 8.9–10.3)
Chloride: 101 mmol/L (ref 98–111)
Creatinine, Ser: 3.26 mg/dL — ABNORMAL HIGH (ref 0.44–1.00)
GFR, Estimated: 13 mL/min — ABNORMAL LOW (ref >60)
Glucose, Bld: 176 mg/dL — ABNORMAL HIGH (ref 70–99)
Potassium: 3.6 mmol/L (ref 3.5–5.1)
Sodium: 134 mmol/L — ABNORMAL LOW (ref 135–145)

## 2023-01-23 LAB — LIPID PANEL
Cholesterol: 108 mg/dL (ref 0–200)
HDL: 39 mg/dL — ABNORMAL LOW (ref >40)
LDL Cholesterol: 50 mg/dL (ref 0–99)
Total CHOL/HDL Ratio: 2.8 {ratio}
Triglycerides: 95 mg/dL (ref <150)
VLDL: 19 mg/dL (ref 0–40)

## 2023-01-23 LAB — HEPARIN LEVEL (UNFRACTIONATED): Heparin Unfractionated: 0.37 [IU]/mL (ref 0.30–0.70)

## 2023-01-23 LAB — CREATININE, SERUM
Creatinine, Ser: 3.42 mg/dL — ABNORMAL HIGH (ref 0.44–1.00)
GFR, Estimated: 12 mL/min — ABNORMAL LOW (ref >60)

## 2023-01-23 LAB — GLUCOSE, CAPILLARY
Glucose-Capillary: 133 mg/dL — ABNORMAL HIGH (ref 70–99)
Glucose-Capillary: 237 mg/dL — ABNORMAL HIGH (ref 70–99)
Glucose-Capillary: 152 mg/dL — ABNORMAL HIGH (ref 70–99)
Glucose-Capillary: 250 mg/dL — ABNORMAL HIGH (ref 70–99)

## 2023-01-23 MED ORDER — ALUM & MAG HYDROXIDE-SIMETH 200-200-20 MG/5ML PO SUSP
30.0000 mL | Freq: Four times a day (QID) | ORAL | Status: DC | PRN
Start: 2023-01-23 — End: 2023-01-26
  Administered 2023-01-23 – 2023-01-25 (×3): 30 mL via TOPICAL
  Filled 2023-01-23 (×4): qty 30

## 2023-01-23 MED ORDER — BISACODYL 10 MG RE SUPP
10.0000 mg | Freq: Every day | RECTAL | Status: DC | PRN
  Administered 2023-01-24: 10 mg via RECTAL
  Filled 2023-01-23: qty 1

## 2023-01-23 MED ORDER — ENOXAPARIN SODIUM 30 MG/0.3ML IJ SOSY
30.0000 mg | PREFILLED_SYRINGE | INTRAMUSCULAR | Status: DC
  Administered 2023-01-23 – 2023-01-25 (×3): 30 mg via SUBCUTANEOUS
  Filled 2023-01-23 (×3): qty 0.3

## 2023-01-23 MED ORDER — ALUM & MAG HYDROXIDE-SIMETH 200-200-20 MG/5ML PO SUSP
15.0000 mL | Freq: Once | ORAL | Status: AC | PRN
  Administered 2023-01-23: 15 mL via ORAL
  Filled 2023-01-23: qty 30

## 2023-01-23 MED ORDER — ISOSORBIDE MONONITRATE ER 60 MG PO TB24
60.0000 mg | ORAL_TABLET | Freq: Every day | ORAL | Status: DC
  Administered 2023-01-23 – 2023-01-26 (×4): 60 mg via ORAL
  Filled 2023-01-23 (×4): qty 1

## 2023-01-23 MED ORDER — SENNOSIDES-DOCUSATE SODIUM 8.6-50 MG PO TABS
2.0000 | ORAL_TABLET | Freq: Every day | ORAL | Status: DC
  Administered 2023-01-23 – 2023-01-25 (×2): 2 via ORAL
  Filled 2023-01-23 (×2): qty 2

## 2023-01-23 MED ORDER — SODIUM CHLORIDE 0.9 % IV SOLN: INTRAVENOUS | Status: DC

## 2023-01-23 NOTE — Evaluation (Addendum)
Physical Therapy Evaluation Patient Details Name: Karen [name] MRN: [id number] DOB: [birth year] Today's Date: 01/23/2023  History of Present Illness  89 y.o. female admitted 01/20/23 with NSTEMI, AKI on CKD. PET scan concerning for CAD; plan for medical management. PMH includes CHF, DM2, HTN, CKD IV, anemia, gout, fibromyalgia, TKA/THA.   Clinical Impression  Pt presents with an overall decrease in functional mobility secondary to above. PTA, pt mod indep ambulating with rollator, family supervises for stairs and community mobility; lives with son who assists with iADLs as needed. Today, pt moving fairly well with rollator and intermittent CGA; pt denies dizziness but c/o feeling "weak." Pt's stability, confidence and breathing improving with mobility progression. Pt would benefit from continued acute PT services to maximize functional mobility and independence prior to d/c with HHPT services.     pt initially c/o SOB with mobility, though no significant DOE noted; no c/o SOB with activity progression  Orthostatic BPs Supine 95/45 (60), HR 81  Sitting 109/55 (66), HR 87  Standing 94/74 (79)  Post-ambulation 115/82 (93)      If plan is discharge home, recommend the following: A little help with bathing/dressing/bathroom;Assistance with cooking/housework;Assist for transportation;Help with stairs or ramp for entrance   Can travel by private vehicle    Yes    Equipment Recommendations None recommended by PT  Recommendations for Other Services   Mobility Specialist   Functional Status Assessment Patient has had a recent decline in their functional status and demonstrates the ability to make significant improvements in function in a reasonable and predictable amount of time.     Precautions / Restrictions Precautions Precautions: Fall;Other (comment) Precaution Comments: initial soft BP but increased with activity and negative orthostatic Restrictions Weight Bearing Restrictions  Per Provider Order: No      Mobility  Bed Mobility Overal bed mobility: Independent                  Transfers Overall transfer level: Needs assistance Equipment used: Rollator (4 wheels) Transfers: Sit to/from Stand, Bed to chair/wheelchair/BSC Sit to Stand: Supervision   Step pivot transfers: Supervision       General transfer comment: good awareness to lock/unlock rollator brakes with 1x cue when pt taking seated rest break during walk; supervision for safety/lines    Ambulation/Gait Ambulation/Gait assistance: Contact guard assist Gait Distance (Feet): 28 Feet (+ 40') Assistive device: Rollator (4 wheels) Gait Pattern/deviations: Step-through pattern, Decreased stride length Gait velocity: Decreased     General Gait Details: slow, guarded gait with rollator and CGA for balance/lines; pt walking ~28' then c/o feeling "weak" opting to take seated rest break on rollator, BP stable; additional walk 40' with rollator and standby assist; no overt instability or LOB  Stairs            Wheelchair Mobility     Tilt Bed    Modified Rankin (Stroke Patients Only)       Balance Overall balance assessment: Needs assistance Sitting-balance support: No upper extremity supported, Feet supported Sitting balance-Leahy Scale: Good     Standing balance support: Bilateral upper extremity supported, No upper extremity supported, During functional activity Standing balance-Leahy Scale: Fair Standing balance comment: can static stand and take steps without UE support; preference for rollator                             Pertinent Vitals/Pain Pain Assessment Pain Assessment: No/denies pain    Home Living Family/patient expects  to be discharged to:: Private residence Living Arrangements: Children Available Help at Discharge: Family;Neighbor;Available PRN/intermittently Type of Home: House Home Access: Stairs to enter Entrance Stairs-Rails:  Right Entrance Stairs-Number of Steps: 3   Home Layout: One level Home Equipment: Agricultural consultant (2 wheels);Rollator (4 wheels);Shower seat;Grab bars - toilet;Grab bars - tub/shower;Electric scooter Additional Comments: lives with son who works outside home; pt's other son in town visiting through January. also has supportive neighbors if help is needed while son at work    Prior Function Prior Level of Function : Independent/Modified Independent             Mobility Comments: mod indep with rollator; enjoys staying active with housework and cooking as able. family always present when pt going up/down stairs or walking outside of home ADLs Comments: mod indep for self care tasks and manages her own meds; son assists with iADLs as needed. family drives     Extremity/Trunk Assessment   Upper Extremity Assessment Upper Extremity Assessment: Generalized weakness (h/o R rotator cuff injury with limited overhead AROM)    Lower Extremity Assessment Lower Extremity Assessment: Generalized weakness (h/o bilateral THAs and TKAs)       Communication   Communication Communication: Hearing impairment  Cognition Arousal: Alert Behavior During Therapy: WFL for tasks assessed/performed Overall Cognitive Status: Within Functional Limits for tasks assessed                                 General Comments: WFL for simple tasks; some repetition in conversation otherwise very sharp        General Comments General comments (skin integrity, edema, etc.): pt's son present and supportive. educ pt re: POC, activity recommendations, importance of OOB mobility, discharge needs.    Exercises     Assessment/Plan    PT Assessment Patient needs continued PT services  PT Problem List Decreased strength;Decreased activity tolerance;Decreased balance;Decreased mobility;Cardiopulmonary status limiting activity       PT Treatment Interventions DME instruction;Gait training;Stair  training;Functional mobility training;Therapeutic activities;Therapeutic exercise;Balance training;Patient/family education    PT Goals (Current goals can be found in the Care Plan section)  Acute Rehab PT Goals Patient Stated Goal: decreased SOB, return home PT Goal Formulation: With patient/family Time For Goal Achievement: 02/06/23 Potential to Achieve Goals: Good    Frequency Min 1X/week     Co-evaluation               AM-PAC PT "6 Clicks" Mobility  Outcome Measure Help needed turning from your back to your side while in a flat bed without using bedrails?: None Help needed moving from lying on your back to sitting on the side of a flat bed without using bedrails?: None Help needed moving to and from a bed to a chair (including a wheelchair)?: A Little Help needed standing up from a chair using your arms (e.g., wheelchair or bedside chair)?: A Little Help needed to walk in hospital room?: A Little Help needed climbing 3-5 steps with a railing? : A Little 6 Click Score: 20    End of Session Equipment Utilized During Treatment: Gait belt Activity Tolerance: Patient tolerated treatment well Patient left: in chair;with call bell/phone within reach;with family/visitor present Nurse Communication: Mobility status PT Visit Diagnosis: Other abnormalities of gait and mobility (R26.89);Muscle weakness (generalized) (M62.81)    Time: 1324-4010 PT Time Calculation (min) (ACUTE ONLY): 24 min   Charges:   PT Evaluation $PT Eval Low Complexity:  1 Low PT Treatments $Therapeutic Activity: 8-22 mins PT General Charges $$ ACUTE PT VISIT: 1 Visit       Ina Homes, PT, DPT Acute Rehabilitation Services  Personal: Secure Chat Rehab Office: [phone]  Malachy Chamber 01/23/2023, 10:03 AM

## 2023-01-23 NOTE — Evaluation (Signed)
Occupational Therapy Evaluation Patient Details Name: Karen [name] MRN: [id number] DOB: [birth year] Today's Date: 01/23/2023   History of Present Illness 89 y.o. female admitted 01/20/23 with NSTEMI, AKI on CKD. PET scan concerning for CAD; plan for medical management. PMH includes CHF, DM2, HTN, CKD IV, anemia, gout, fibromyalgia, TKA/THA.   Clinical Impression   PTA, pt was living with her sons and was independent with ADLs and light IADLs; use of rollator for mobility. Pt currently performing ADLs and functional mobility at Supervision with use of rollator.  Pt reporting feeling fatigued and very self aware to when she may needs rest breaks. Pt would benefit from further acute OT to facilitate safe dc. Recommend dc to home once medically stable per physician.   Orthostatic BPs   Supine 97/63  Sitting at EOB 94/67  Seated with legs elevated 110/67  Post ambulation 141/72        If plan is discharge home, recommend the following: Assistance with cooking/housework    Functional Status Assessment  Patient has had a recent decline in their functional status and demonstrates the ability to make significant improvements in function in a reasonable and predictable amount of time.  Equipment Recommendations  None recommended by OT    Recommendations for Other Services       Precautions / Restrictions Precautions Precautions: Fall;Other (comment) Precaution Comments: initial soft BP but increased with activity and negative orthostatic Restrictions Weight Bearing Restrictions Per Provider Order: No      Mobility Bed Mobility Overal bed mobility: Independent                  Transfers Overall transfer level: Needs assistance Equipment used: Rollator (4 wheels) Transfers: Sit to/from Stand Sit to Stand: Supervision           General transfer comment: Supervision for safety. Min cues for managing breaks on rollator      Balance Overall balance assessment:  Needs assistance Sitting-balance support: No upper extremity supported, Feet supported Sitting balance-Leahy Scale: Good     Standing balance support: Bilateral upper extremity supported, No upper extremity supported, During functional activity Standing balance-Leahy Scale: Fair Standing balance comment: can static stand and take steps without UE support; preference for rollator                           ADL either performed or assessed with clinical judgement   ADL Overall ADL's : Needs assistance/impaired                                       General ADL Comments: Pt performing at Supervision level for safety with BP monitored. Able to complete two bounds of mobility from EOB to door with use of rollator.     Vision Baseline Vision/History: 1 Wears glasses       Perception         Praxis         Pertinent Vitals/Pain Pain Assessment Pain Assessment: No/denies pain     Extremity/Trunk Assessment Upper Extremity Assessment Upper Extremity Assessment: Generalized weakness (h/o R rotator cuff injury with limited overhead AROM)   Lower Extremity Assessment Lower Extremity Assessment: Generalized weakness   Cervical / Trunk Assessment Cervical / Trunk Assessment: Kyphotic   Communication Communication Communication: Hearing impairment   Cognition Arousal: Alert Behavior During Therapy: WFL for tasks assessed/performed Overall Cognitive Status: Within  Functional Limits for tasks assessed                                 General Comments: WFL for simple tasks; some repetition in conversation otherwise very sharp     General Comments  BP monitored throughout. SpO2 and HR stable. BP supine 97/63 (71), sitting 94/67, standing 110/67, after first walk 117/75, after second walk 141/72    Exercises     Shoulder Instructions      Home Living Family/patient expects to be discharged to:: Private residence Living Arrangements:  Children Available Help at Discharge: Family;Neighbor;Available PRN/intermittently Type of Home: House Home Access: Stairs to enter [employer] of Steps: 3 Entrance Stairs-Rails: Right Home Layout: One level     Bathroom Shower/Tub: Producer, television/film/video: Handicapped height Bathroom Accessibility: Yes   Home Equipment: Agricultural consultant (2 wheels);Rollator (4 wheels);Shower seat;Grab bars - toilet;Grab bars - tub/shower;Electric scooter   Additional Comments: lives with son who works outside home; pt's other son in town visiting through January. also has supportive neighbors if help is needed while son at work      Prior Functioning/Environment Prior Level of Function : Independent/Modified Independent             Mobility Comments: mod indep with rollator; enjoys staying active with housework and cooking as able. family always present when pt going up/down stairs or walking outside of home ADLs Comments: mod indep for self care tasks and manages her own meds; son assists with iADLs as needed. family drives        OT Problem List: Decreased strength;Decreased activity tolerance;Impaired balance (sitting and/or standing);Decreased knowledge of precautions      OT Treatment/Interventions: Self-care/ADL training;Therapeutic exercise;Energy conservation;DME and/or AE instruction;Therapeutic activities;Patient/family education    OT Goals(Current goals can be found in the care plan section) Acute Rehab OT Goals Patient Stated Goal: Go home OT Goal Formulation: With patient Time For Goal Achievement: 02/06/23 Potential to Achieve Goals: Good  OT Frequency: Min 1X/week    Co-evaluation              AM-PAC OT "6 Clicks" Daily Activity     Outcome Measure Help from another person eating meals?: None Help from another person taking care of personal grooming?: None Help from another person toileting, which includes using toliet, bedpan, or urinal?: A  Little Help from another person bathing (including washing, rinsing, drying)?: A Little Help from another person to put on and taking off regular upper body clothing?: A Little Help from another person to put on and taking off regular lower body clothing?: A Little 6 Click Score: 20   End of Session Equipment Utilized During Treatment: Gait belt;Rollator (4 wheels) Nurse Communication: Mobility status  Activity Tolerance: Patient tolerated treatment well Patient left: in chair;with call bell/phone within reach;with family/visitor present  OT Visit Diagnosis: Unsteadiness on feet (R26.81);Other abnormalities of gait and mobility (R26.89);Muscle weakness (generalized) (M62.81)                Time: 4098-1191 OT Time Calculation (min): 21 min Charges:  OT General Charges $OT Visit: 1 Visit OT Evaluation $OT Eval Low Complexity: 1 Low  Shawntel Farnworth MSOT, OTR/L Acute Rehab Office: [phone]  Theodoro Grist Searcy Miyoshi 01/23/2023, 12:54 PM

## 2023-01-23 NOTE — Progress Notes (Signed)
PROGRESS NOTE    Karen [name]  WUJ:[id number] DOB: 1928/08/13 DOA: 01/20/2023 PCP: Irena Reichmann, DO  Chief Complaint  Patient presents with   Shortness of Breath    Brief Narrative:    Karen [name] is Karen [name] 89 y.o. female with medical history significant for chronic diastolic heart failure, type 2 diabetes mellitus, essential hypertension, CKD stage IV associated baseline creatinine 1.5-1.8, chronic macrocytic anemia with baseline hemoglobin range 10-12, who is admitted to [facility] on 01/20/2023 with acute on chronic diastolic heart failure after presenting from home to [facility] ED complaining of shortness of breath.  Now found to have NSTEMI, cardiology following.   Assessment & Plan:   Principal Problem:   NSTEMI (non-ST elevated myocardial infarction) (HCC) Active Problems:   Acute renal failure superimposed on stage 4 chronic kidney disease (HCC)   Type 2 diabetes mellitus with hyperlipidemia (HCC)   Essential hypertension   Hypokalemia   Macrocytic anemia   Acute on chronic diastolic heart failure (HCC)   Hyperkalemia   Hyponatremia   Chronic systolic CHF (congestive heart failure) (HCC)   GERD (gastroesophageal reflux disease)  NSTEMI  Shortness of Breath  Coronary Artery Disease CT cardiac perfusion PET with findings concerning for L main or 3 vessel CAD due to drop in EF and TID despite normal perfusion imaging Troponin rising to 1,134 Appreciate cardiology recs -> aspirin, lipitor, metoprolol.  Imdur started, titrated by cards.  Antianginals limited by low BP.   Echo with EF 35-40%, RWMA (akinetic, aneurysmal apex and septum with hyperdynamic lateral wall)   AKI on CKD IV Baseline creatinine around 1.6-1.7 Creatinine peaked to 3.43 Mild improvement - monitor with IVF  Renal US with chronic medical renal disease UA bland Will trend creatinine -> related to relative hypotension? Hold home lasix and valstartan   Hypertension (currently with relative  hypotension) Amlodipine (hold with relative hypotension) Continue metoprolol reduced dose Watch on imdur  Holding lasix (3 times weekly) and valstartan  T2DM Glipizide and metformin (attention to creatinine before resuming this at discharge) on hold  Basal insulin, SSI - adjust prn   Dyslipidemia Lipitor  Insomnia Remeron, seroquel     DVT prophylaxis: lovenox Code Status: full Family Communication: son's at bedside Disposition:   Status is: Inpatient Remains inpatient appropriate because: need for continued inpatient care   Consultants:  cardiology  Procedures:  Pending echo  Antimicrobials:  Anti-infectives (From admission, onward)    None       Subjective: No new complaints 2 sons at bedside  Objective: Vitals:   01/23/23 0257 01/23/23 0745 01/23/23 1028 01/23/23 1100  BP: 103/65 (!) 98/59 (!) 100/57 97/63  Pulse: 85 81 80   Resp: 16 16  16   Temp: (!) 97.5 F (36.4 C) (!) 97.5 F (36.4 C)  (!) 97.5 F (36.4 C)  TempSrc: Oral Oral  Oral  SpO2:  100%    Weight: 62 kg     Height:        Intake/Output Summary (Last 24 hours) at 01/23/2023 1355 Last data filed at 01/23/2023 1228 Gross per 24 hour  Intake 360 ml  Output 1500 ml  Net -1140 ml   Filed Weights   01/21/23 0333 01/22/23 0506 01/23/23 0257  Weight: 62.8 kg 60.8 kg 62 kg    Examination:  General: No acute distress. Cardiovascular: RRR Lungs: unlabored Neurological: Alert and oriented 3. Moves all extremities 4. Cranial nerves II through XII grossly intact. Extremities: No clubbing or cyanosis. No edema  Data Reviewed: I have personally reviewed following labs and imaging studies  CBC: Recent Labs  Lab 01/20/23 1400 01/21/23 0442 01/22/23 0301 01/23/23 0518  WBC 8.1 5.4 5.8 5.3  NEUTROABS 6.6  --   --   --   HGB 10.4* 9.6* 9.1* 8.2*  HCT 32.1* 28.7* 27.2* 24.4*  MCV 109.6* 104.0* 104.6* 103.8*  PLT 115* 137* 109* 107*    Basic Metabolic Panel: Recent Labs  Lab  01/20/23 1400 01/20/23 2333 01/21/23 0442 01/22/23 0932 01/23/23 0518  NA 132* 136 134* 134* 134*  K 5.4* 4.6 4.0 3.4* 3.6  CL 101 102 102 99 101  CO2 17* 19* 20* 22 20*  GLUCOSE 374* 231* 242* 261* 176*  BUN 67* 69* 68* 68* 66*  CREATININE 2.79* 2.59* 3.02* 3.43* 3.26*  CALCIUM 9.8 9.7 9.4 9.3 8.7*  MG  --  2.3 2.2  --   --     GFR: Estimated Creatinine Clearance: 9.5 mL/min (Tonia Avino) (by C-G formula based on SCr of 3.26 mg/dL (H)).  Liver Function Tests: Recent Labs  Lab 01/21/23 0442  AST 20  ALT 16  ALKPHOS 56  BILITOT 0.5  PROT 6.5  ALBUMIN 3.2*    CBG: Recent Labs  Lab 01/22/23 1152 01/22/23 1642 01/22/23 2106 01/23/23 0747 01/23/23 1138  GLUCAP 186* 131* 143* 133* 237*     No results found for this or any previous visit (from the past 240 hours).       Radiology Studies: No results found.       Scheduled Meds:  amLODipine  2.5 mg Oral Daily   aspirin  81 mg Oral Daily   atorvastatin  40 mg Oral QHS   docusate sodium  100 mg Oral BID   insulin aspart  0-5 Units Subcutaneous QHS   insulin aspart  0-9 Units Subcutaneous TID WC   insulin glargine-yfgn  6 Units Subcutaneous QHS   isosorbide mononitrate  60 mg Oral Daily   metoprolol succinate  50 mg Oral Daily   mirtazapine  15 mg Oral QHS   pantoprazole  40 mg Oral Daily   polyethylene glycol  17 g Oral BID   QUEtiapine  200 mg Oral QHS   senna-docusate  2 tablet Oral QHS   Continuous Infusions:  sodium chloride       LOS: 3 days    Time spent: over 30 min     Lacretia Nicks, MD [facility]   To contact the attending provider between 7A-7P or the covering provider during after hours 7P-7A, please log into the web site [email or web address] and access using universal [place] password for that web site. If you do not have the password, please call the hospital operator.  01/23/2023, 1:55 PM

## 2023-01-23 NOTE — Plan of Care (Signed)

## 2023-01-23 NOTE — Progress Notes (Signed)
Patient Name: Karen [name] Date of Encounter: 01/23/2023 [facility] Cardiologist: Thurmon Fair, MD   Interval Summary  .    Continues to have severe shortness of breath with slight activity such as using the bedside commode.  Dyspnea has always been her anginal equivalent. She does not have any dyspnea at rest and is able to lie fully supine in bed. Blood pressure is borderline low but she has not had dizziness or presyncope. Also has problems with dysphagia which goes back many years.  Sometimes she regurgitates solids and fluids.  She did have a Botox injection for achalasia more than 10 years ago.  Vital Signs .    Vitals:   01/22/23 1648 01/22/23 2000 01/23/23 0257 01/23/23 0745  BP:   103/65 (!) 98/59  Pulse:   85 81  Resp: 17  16 16   Temp: 98.9 F (37.2 C) 98 F (36.7 C) (!) 97.5 F (36.4 C) (!) 97.5 F (36.4 C)  TempSrc: Oral Oral Oral Oral  SpO2:    100%  Weight:   62 kg   Height:        Intake/Output Summary (Last 24 hours) at 01/23/2023 0838 Last data filed at 01/23/2023 0749 Gross per 24 hour  Intake --  Output 1400 ml  Net -1400 ml      01/23/2023    2:57 AM 01/22/2023    5:06 AM 01/21/2023    3:33 AM  Last 3 Weights  Weight (lbs) 136 lb 9.6 oz 134 lb 1.6 oz 138 lb 8 oz  Weight (kg) 61.961 kg 60.827 kg 62.823 kg      Telemetry/ECG    Normal sinus rhythm- Personally Reviewed  Echocardiogram shows significant reduction in LV function compared to last year, EF 35-40% with regional wall motion abnormalities (aneurysmal apex).  There is moderate (rather than mild) mitral insufficiency with a central jet.  Known aneurysm of the ascending aorta at 4.8 cm.  Based on the echo findings, left atrial filling pressures are elevated   Physical Exam .   GEN: No acute distress.   Neck: No JVD Cardiac: RRR, no murmurs, rubs, or gallops.  Respiratory: Clear to auscultation bilaterally. GI: Soft, nontender, non-distended  MS: No  edema  Assessment & Plan .     CAD: Challenging to say whether her shortness of breath is purely due to congestive heart failure or is an anginal equivalent (she has never really had chest pain and her dyspnea improved after stent placement).  Her echocardiogram shows aneurysmal change in the mid distal LAD artery distribution (she previously had a stent in that vessel) and she has probably completed then infarction in that distribution.  Stop IV heparin today.  PET scan concerning for multivessel disease and she was unable to have previous cardiac catheterization via the radial artery (had serious groin hematoma with femoral access).  She is already taking metoprolol succinate 50 mg daily, isosorbide mononitrate 30 mg daily and amlodipine 2.5 mg daily.  Doses of antianginals are limited by her low blood pressure. She uses Seroquel and Remeron daily, adding Ranexa may lead to excessive QT prolongation.  Will increase her long-acting nitrates today. CHF: Her shortness of breath that is obviously also due to reduced LVEF, now 35-40%.  Clinically she appears to be euvolemic.  Medications for heart failure also limited by low blood pressure and by acute kidney dysfunction.  If renal function continues to improve, would add SGLT2 inhibitor such as [place] or Jardiance. Acute on chronic  kidney disease 3B-4: Prior to the acute events her baseline creatinine was around 1.7 (GFR 25-30) and she was on a high dose of ARB, but with diuresis during current admission she has developed AKI.  Her diuretics have been stopped and she is off ARB.  Today her renal dysfunction has plateaued and may actually be showing some signs of improvement.  She does not think she would ever want to go on dialysis.  At age 89 she would probably not do well with hemodialysis. HLP/DM: Most recent hemoglobin A1c was 8.7% and LDL cholesterol 50, HDL 39 on statin  For questions or updates, please contact [facility] Please consult  [email or web address] for contact info under        Signed, Thurmon Fair, MD

## 2023-01-24 DIAGNOSIS — I214 Non-ST elevation (NSTEMI) myocardial infarction: Secondary | ICD-10-CM | POA: Diagnosis not present

## 2023-01-24 DIAGNOSIS — I5043 Acute on chronic combined systolic (congestive) and diastolic (congestive) heart failure: Secondary | ICD-10-CM | POA: Diagnosis not present

## 2023-01-24 LAB — CBC
HCT: 24.8 % — ABNORMAL LOW (ref 36.0–46.0)
Hemoglobin: 8.5 g/dL — ABNORMAL LOW (ref 12.0–15.0)
MCH: 35.6 pg — ABNORMAL HIGH (ref 26.0–34.0)
MCHC: 34.3 g/dL (ref 30.0–36.0)
MCV: 103.8 fL — ABNORMAL HIGH (ref 80.0–100.0)
Platelets: 108 10*3/uL — ABNORMAL LOW (ref 150–400)
RBC: 2.39 MIL/uL — ABNORMAL LOW (ref 3.87–5.11)
RDW: 13.2 % (ref 11.5–15.5)
WBC: 5.3 10*3/uL (ref 4.0–10.5)
nRBC: 0 % (ref 0.0–0.2)

## 2023-01-24 LAB — BASIC METABOLIC PANEL
Anion gap: 8 (ref 5–15)
BUN: 60 mg/dL — ABNORMAL HIGH (ref 8–23)
CO2: 22 mmol/L (ref 22–32)
Calcium: 9.1 mg/dL (ref 8.9–10.3)
Chloride: 103 mmol/L (ref 98–111)
Creatinine, Ser: 2.96 mg/dL — ABNORMAL HIGH (ref 0.44–1.00)
GFR, Estimated: 14 mL/min — ABNORMAL LOW (ref >60)
Glucose, Bld: 233 mg/dL — ABNORMAL HIGH (ref 70–99)
Potassium: 4.5 mmol/L (ref 3.5–5.1)
Sodium: 133 mmol/L — ABNORMAL LOW (ref 135–145)

## 2023-01-24 LAB — GLUCOSE, CAPILLARY
Glucose-Capillary: 246 mg/dL — ABNORMAL HIGH (ref 70–99)
Glucose-Capillary: 177 mg/dL — ABNORMAL HIGH (ref 70–99)
Glucose-Capillary: 210 mg/dL — ABNORMAL HIGH (ref 70–99)
Glucose-Capillary: 203 mg/dL — ABNORMAL HIGH (ref 70–99)

## 2023-01-24 LAB — HEPARIN LEVEL (UNFRACTIONATED): Heparin Unfractionated: <0.1 [IU]/mL — ABNORMAL LOW (ref 0.30–0.70)

## 2023-01-24 MED ORDER — BISACODYL 10 MG RE SUPP
10.0000 mg | Freq: Once | RECTAL | Status: AC
  Administered 2023-01-24: 10 mg via RECTAL
  Filled 2023-01-24: qty 1

## 2023-01-24 MED ORDER — FAMOTIDINE 20 MG PO TABS
10.0000 mg | ORAL_TABLET | Freq: Once | ORAL | Status: AC
  Administered 2023-01-24: 10 mg via ORAL
  Filled 2023-01-24: qty 1

## 2023-01-24 NOTE — Plan of Care (Signed)

## 2023-01-24 NOTE — Progress Notes (Signed)
Patient complaining of indigestion with chest discomfort and is requesting for Maalox. RN placed O2 as patient also complaining of shortness of breath. Patient's HR fluctuating 70-130s during this episode. Patient requesting that her evening medications be given early so that she could rest. VS stable. After 15 minutes of taking medications, patient is more comfortable and feeling ease of her symptoms. Patient resting in bed at this time.

## 2023-01-24 NOTE — Plan of Care (Signed)

## 2023-01-24 NOTE — TOC Initial Note (Signed)
Transition of Care [facility]) - Initial/Assessment Note    Patient Details  Name: Karen [name] MRN: [id number] Date of Birth: 1928/04/05  Transition of Care [facility]) CM/SW Contact:    Gala Lewandowsky, RN Phone Number: 01/24/2023, 3:18 PM  Clinical Narrative:  Patient presented for Nstemi. PTA patient was from home with two sons. Patient has DME rolling walker, bedside commode, wheelchair, and power chair. Case Manager discussed home health services and she chose [facility] for Services. Referral submitted and start of care to begin within 24-48 hours post transition home. Case Manager will continue to follow for additional needs.               Expected Discharge Plan: Home w Home Health Services    Patient Goals and CMS Choice Patient states their goals for this hospitalization and ongoing recovery are:: Patient plans to return home with home health CMS Medicare.gov Compare Post Acute Care list provided to:: Patient Choice offered to / list presented to : NA, Patient    Expected Discharge Plan and Services In-house Referral: NA Discharge Planning Services: CM Consult Post Acute Care Choice: Home Health Living arrangements for the past 2 months: Single Family Home  HH Arranged: PT HH Agency: [facility] Date [facility] Agency Contacted: 01/24/23 Time HH Agency Contacted: 1430 Representative spoke with at [facility] Agency: Elnita Maxwell.  Prior Living Arrangements/Services Living arrangements for the past 2 months: Single Family Home Lives with:: Adult Children Patient language and need for interpreter reviewed:: Yes Do you feel safe going back to the place where you live?: Yes      Need for Family Participation in Patient Care: Yes (Comment) Care giver support system in place?: Yes (comment) Current home services: DME (rolling walker, bedside commode, wheelchair/powerchair,) Criminal Activity/Legal Involvement Pertinent to Current Situation/Hospitalization: No -  Comment as needed  Activities of Daily Living   ADL Screening (condition at time of admission) Independently performs ADLs?: No Does the patient have a NEW difficulty with bathing/dressing/toileting/self-feeding that is expected to last >3 days?: Yes (Initiates electronic notice to provider for possible OT consult) Does the patient have a NEW difficulty with getting in/out of bed, walking, or climbing stairs that is expected to last >3 days?: Yes (Initiates electronic notice to provider for possible PT consult) Does the patient have a NEW difficulty with communication that is expected to last >3 days?: No Is the patient deaf or have difficulty hearing?: Yes Does the patient have difficulty seeing, even when wearing glasses/contacts?: No Does the patient have difficulty concentrating, remembering, or making decisions?: No  Permission Sought/Granted Permission sought to share information with : Family Supports, Magazine features editor, Case Automotive engineer granted to share info w AGENCY: [facility]        Emotional Assessment Appearance:: Appears stated age Attitude/Demeanor/Rapport: Engaged Affect (typically observed): Appropriate Orientation: : Oriented to Self, Oriented to Place, Oriented to  Time, Oriented to Situation Alcohol / Substance Use: Not Applicable Psych Involvement: No (comment)  Admission diagnosis:  Acute on chronic diastolic heart failure (HCC) [I50.33] Elevated troponin [R79.89] AKI (acute kidney injury) (HCC) [N17.9] Congestive heart failure, unspecified HF chronicity, unspecified heart failure type [facility]) [I50.9] Patient Active Problem List   Diagnosis Date Noted   NSTEMI (non-ST elevated myocardial infarction) (HCC) 01/22/2023   Hyponatremia 01/22/2023   Chronic systolic CHF (congestive heart failure) (HCC) 01/22/2023   GERD (gastroesophageal reflux disease) 01/22/2023   Acute renal failure superimposed on stage 4 chronic kidney  disease (HCC)  01/21/2023   Hyperkalemia 01/21/2023   Acute on chronic diastolic heart failure (HCC) 01/20/2023   Type 2 diabetes mellitus with hyperlipidemia (HCC) 04/26/2021   CKD (chronic kidney disease) stage 3, GFR 30-59 ml/min (HCC) 04/26/2021   Macrocytic anemia 04/26/2021   S/P total hip arthroplasty 04/08/2021   Closed dislocation of left hip, initial encounter (HCC) 04/08/2021   OA (osteoarthritis) of hip 05/14/2020   Primary osteoarthritis of right hip 05/14/2020   Bilateral leg weakness 07/15/2015   Dyspnea on exertion 10/09/2013   Cardiomyopathy, mixed hypertensive and ischemic etiology 05/27/2013   Hypercholesterolemia 05/27/2013   Coronary artery disease 05/27/2013   Chronic diastolic CHF (congestive heart failure) (HCC) 04/12/2013   Aneurysm of thoracic aorta (HCC) 01/19/2013   Hypokalemia 05/16/2012   Essential hypertension 05/16/2012   PCP:  Irena Reichmann, DO Pharmacy:   [facility] [phone] - [place], [place] - [address] AT [facility] [place] [address] [place] [address] Phone: [phone] Fax: [phone]     Social Drivers of Health (SDOH) Social History: SDOH Screenings   Food Insecurity: No Food Insecurity (01/21/2023)  Housing: Low Risk  (01/21/2023)  Transportation Needs: No Transportation Needs (01/21/2023)  Utilities: Not At Risk (01/21/2023)  Depression (PHQ2-9): Low Risk  (11/30/2021)  Tobacco Use: Low Risk  (01/20/2023)   SDOH Interventions:     Readmission Risk Interventions    01/24/2023    3:09 PM  Readmission Risk Prevention Plan  Transportation Screening Complete  PCP or Specialist Appt within 3-5 Days Complete  HRI or Home Care Consult Complete  Social Work Consult for Recovery Care Planning/Counseling Complete  Palliative Care Screening Not Applicable  Medication Review Oceanographer) Referral to Pharmacy

## 2023-01-24 NOTE — Care Management Important Message (Signed)
Important Message  Patient Details  Name: Karen [name] MRN: [id number] Date of Birth: 02/03/1929   Important Message Given:  Yes - Medicare IM     Renie Ora 01/24/2023, 9:27 AM

## 2023-01-24 NOTE — Progress Notes (Signed)
Rounding Note    Patient Name: Karen [name] Date of Encounter: 01/24/2023  [facility] Cardiologist: Thurmon Fair, MD    Subjective   89 yo with dyspnea - severe dyspnea is her angina equivalent   Has issues with dysphagia ,   She has had serious complications with cath previously  Has CKD, creatinine is 2.96  She will be treated medically       Inpatient Medications    Scheduled Meds:  aspirin  81 mg Oral Daily   atorvastatin  40 mg Oral QHS   bisacodyl  10 mg Rectal Once   docusate sodium  100 mg Oral BID   enoxaparin (LOVENOX) injection  30 mg Subcutaneous Q24H   insulin aspart  0-5 Units Subcutaneous QHS   insulin aspart  0-9 Units Subcutaneous TID WC   insulin glargine-yfgn  6 Units Subcutaneous QHS   isosorbide mononitrate  60 mg Oral Daily   metoprolol succinate  50 mg Oral Daily   mirtazapine  15 mg Oral QHS   pantoprazole  40 mg Oral Daily   polyethylene glycol  17 g Oral BID   QUEtiapine  200 mg Oral QHS   senna-docusate  2 tablet Oral QHS   Continuous Infusions:  sodium chloride 40 mL/hr at 01/23/23 1752   PRN Meds: acetaminophen **OR** acetaminophen, alum & mag hydroxide-simeth, bisacodyl, melatonin, nitroGLYCERIN, ondansetron (ZOFRAN) IV, polyvinyl alcohol   Vital Signs    Vitals:   01/23/23 1100 01/23/23 1550 01/23/23 1924 01/24/23 0445  BP: 97/63  100/62 113/66  Pulse:   80 90  Resp: 16 16 16 18   Temp: (!) 97.5 F (36.4 C) 97.6 F (36.4 C) 98.2 F (36.8 C) 98.1 F (36.7 C)  TempSrc: Oral Oral Oral Oral  SpO2:   99% 100%  Weight:    63.7 kg  Height:        Intake/Output Summary (Last 24 hours) at 01/24/2023 0949 Last data filed at 01/24/2023 1610 Gross per 24 hour  Intake 2244.32 ml  Output 1950 ml  Net 294.32 ml      01/24/2023    4:45 AM 01/23/2023    2:57 AM 01/22/2023    5:06 AM  Last 3 Weights  Weight (lbs) 140 lb 6.9 oz 136 lb 9.6 oz 134 lb 1.6 oz  Weight (kg) 63.7 kg 61.961 kg 60.827 kg       Telemetry     - Personally Reviewed  ECG     - Personally Reviewed  Physical Exam   GEN: Elderly, frail female in no acute distress Neck: No JVD Cardiac: RRR, soft systolic murmur Respiratory: Clear to auscultation bilaterally. GI: Soft, nontender, non-distended  MS: No edema; No deformity. Neuro:  Nonfocal  Psych: Normal affect   Labs    High Sensitivity Troponin:   Recent Labs  Lab 01/20/23 1400 01/20/23 1540 01/21/23 0442  TROPONINIHS 481* 838* 1,134*     Chemistry Recent Labs  Lab 01/20/23 2333 01/21/23 0442 01/22/23 0932 01/23/23 0518 01/23/23 1447 01/24/23 0436  NA 136 134* 134* 134*  --  133*  K 4.6 4.0 3.4* 3.6  --  4.5  CL 102 102 99 101  --  103  CO2 19* 20* 22 20*  --  22  GLUCOSE 231* 242* 261* 176*  --  233*  BUN 69* 68* 68* 66*  --  60*  CREATININE 2.59* 3.02* 3.43* 3.26* 3.42* 2.96*  CALCIUM 9.7 9.4 9.3 8.7*  --  9.1  MG 2.3  2.2  --   --   --   --   PROT  --  6.5  --   --   --   --   ALBUMIN  --  3.2*  --   --   --   --   AST  --  20  --   --   --   --   ALT  --  16  --   --   --   --   ALKPHOS  --  56  --   --   --   --   BILITOT  --  0.5  --   --   --   --   GFRNONAA 17* 14* 12* 13* 12* 14*  ANIONGAP 15 12 13 13   --  8    Lipids  Recent Labs  Lab 01/23/23 0518  CHOL 108  TRIG 95  HDL 39*  LDLCALC 50  CHOLHDL 2.8    Hematology Recent Labs  Lab 01/23/23 0518 01/23/23 1447 01/24/23 0436  WBC 5.3 5.3 5.3  RBC 2.35* 2.51* 2.39*  HGB 8.2* 8.9* 8.5*  HCT 24.4* 26.1* 24.8*  MCV 103.8* 104.0* 103.8*  MCH 34.9* 35.5* 35.6*  MCHC 33.6 34.1 34.3  RDW 13.3 13.2 13.2  PLT 107* 118* 108*   Thyroid No results for input(s): "TSH", "FREET4" in the last 168 hours.  BNP Recent Labs  Lab 01/20/23 1400  BNP 1,734.4*    DDimer No results for input(s): "DDIMER" in the last 168 hours.   Radiology    No results found.  Cardiac Studies     Patient Profile     89 y.o. female   Assessment & Plan    1.  Acute coronary  syndrome: Patient presents with episodes of severe shortness of breath and positive troponin levels.  Echocardiogram reveals akinesis of the apex consistent with a completed myocardial infarction.  She is a very poor candidate for heart catheterization based on her frail status, creatinine of 3 and past vascular complications.  I agree that medical therapy is indicated.  Will continue supportive care with nitrates . She is not a candidate for Ranexa given her QT prolongation risk.   2.  Constipation: This is her main complaint this morning.  Further plans per medical team.         For questions or updates, please contact [facility] Please consult [email or web address] for contact info under        Signed, Kristeen Miss, MD  01/24/2023, 9:49 AM

## 2023-01-24 NOTE — Progress Notes (Signed)
PROGRESS NOTE    Karen [name]  VHQ:[id number] DOB: 06/23/1928 DOA: 01/20/2023 PCP: Irena Reichmann, DO    Brief Narrative:  89 year old female with history of chronic diastolic heart failure, type 2 diabetes, hypertension, CKD stage IV with baseline creatinine was 1.8 who is admitted to the hospital with chest pain and shortness of breath.  She was found to have regional wall motion abnormality, worsening ejection fraction and non-STEMI.  Not an ideal candidate for cardiac cath.  Remains on medical management.  Subjective: Patient seen and examined.  Patient complains of feeling like indigestion overnight multiple episodes.  She is also struggling with constipation.  Did not have much output with Dulcolax yesterday.  Patient understands that she is having heart issues/heart attack and cannot have cardiac cath. Will consult palliative care team to start discussing goal of care. Assessment & Plan:   Non-ST elevation MI: Still with intermittent pain and indigestion. Echocardiogram consistent with ejection fraction 35 to 40%, left ventricle with regional wall motion abnormality, akinetic and aneurysmal apex.  Troponins elevated.  Patient unable to have cardiac cath with CKD and previous severe reaction to contrast.  Recommended medical management.  Cardiology is following. Currently on aspirin, statin, Imdur, metoprolol.  Mobilize today. Patient with non-STEMI, depressed ejection fraction.  Will involve palliative care team.  AKI on CKD stage IV: Baseline creatinine about 1.8.  Peaked at 3.43.  Remains on gentle IV fluids, holding Lasix and valsartan.  Creatinine is trending down.  2.96 today.  Will discontinue IV fluids given depressed ejection fraction.  Recheck levels tomorrow morning.  Type 2 diabetes: On insulin.  Fairly stable today.  Constipation : Significant issue.  She is on MiraLAX twice daily.  Will give additional Dulcolax today.     DVT prophylaxis: enoxaparin (LOVENOX)  injection 30 mg Start: 01/23/23 1515 SCDs Start: 01/23/23 1416 Place and maintain sequential compression device Start: 01/23/23 1416   Code Status: Full code. Family Communication: None at the bedside Disposition Plan: Status is: Inpatient Remains inpatient appropriate because: Medication adjustment     Consultants:  Cardiology Palliative care, called  Procedures:  None  Antimicrobials:  None     Objective: Vitals:   01/23/23 1550 01/23/23 1924 01/24/23 0445 01/24/23 1110  BP:  100/62 113/66 128/70  Pulse:  80 90 78  Resp: 16 16 18 16   Temp: 97.6 F (36.4 C) 98.2 F (36.8 C) 98.1 F (36.7 C) 97.6 F (36.4 C)  TempSrc: Oral Oral Oral Oral  SpO2:  99% 100%   Weight:   63.7 kg   Height:        Intake/Output Summary (Last 24 hours) at 01/24/2023 1150 Last data filed at 01/24/2023 0925 Gross per 24 hour  Intake 2244.32 ml  Output 1950 ml  Net 294.32 ml   Filed Weights   01/22/23 0506 01/23/23 0257 01/24/23 0445  Weight: 60.8 kg 62 kg 63.7 kg    Examination:  General exam: Appears calm and comfortable  Pleasant and interactive. Respiratory system: Clear to auscultation. Respiratory effort normal.  Poor air entry at bases. Cardiovascular system: S1 & S2 heard, RRR.  No edema. Gastrointestinal system: Soft nontender.  Bowel sound present.     Data Reviewed: I have personally reviewed following labs and imaging studies  CBC: Recent Labs  Lab 01/20/23 1400 01/21/23 0442 01/22/23 0301 01/23/23 0518 01/23/23 1447 01/24/23 0436  WBC 8.1 5.4 5.8 5.3 5.3 5.3  NEUTROABS 6.6  --   --   --   --   --  HGB 10.4* 9.6* 9.1* 8.2* 8.9* 8.5*  HCT 32.1* 28.7* 27.2* 24.4* 26.1* 24.8*  MCV 109.6* 104.0* 104.6* 103.8* 104.0* 103.8*  PLT 115* 137* 109* 107* 118* 108*   Basic Metabolic Panel: Recent Labs  Lab 01/20/23 2333 01/21/23 0442 01/22/23 0932 01/23/23 0518 01/23/23 1447 01/24/23 0436  NA 136 134* 134* 134*  --  133*  K 4.6 4.0 3.4* 3.6  --  4.5   CL 102 102 99 101  --  103  CO2 19* 20* 22 20*  --  22  GLUCOSE 231* 242* 261* 176*  --  233*  BUN 69* 68* 68* 66*  --  60*  CREATININE 2.59* 3.02* 3.43* 3.26* 3.42* 2.96*  CALCIUM 9.7 9.4 9.3 8.7*  --  9.1  MG 2.3 2.2  --   --   --   --    GFR: Estimated Creatinine Clearance: 10.5 mL/min (A) (by C-G formula based on SCr of 2.96 mg/dL (H)). Liver Function Tests: Recent Labs  Lab 01/21/23 0442  AST 20  ALT 16  ALKPHOS 56  BILITOT 0.5  PROT 6.5  ALBUMIN 3.2*   No results for input(s): "LIPASE", "AMYLASE" in the last 168 hours. No results for input(s): "AMMONIA" in the last 168 hours. Coagulation Profile: No results for input(s): "INR", "PROTIME" in the last 168 hours. Cardiac Enzymes: No results for input(s): "CKTOTAL", "CKMB", "CKMBINDEX", "TROPONINI" in the last 168 hours. BNP (last 3 results) No results for input(s): "PROBNP" in the last 8760 hours. HbA1C: No results for input(s): "HGBA1C" in the last 72 hours. CBG: Recent Labs  Lab 01/23/23 1138 01/23/23 1659 01/23/23 2112 01/24/23 0757 01/24/23 1114  GLUCAP 237* 152* 250* 246* 177*   Lipid Profile: Recent Labs    01/23/23 0518  CHOL 108  HDL 39*  LDLCALC 50  TRIG 95  CHOLHDL 2.8   Thyroid Function Tests: No results for input(s): "TSH", "T4TOTAL", "FREET4", "T3FREE", "THYROIDAB" in the last 72 hours. Anemia Panel: Recent Labs    01/23/23 0518  VITAMINB12 214   Sepsis Labs: No results for input(s): "PROCALCITON", "LATICACIDVEN" in the last 168 hours.  No results found for this or any previous visit (from the past 240 hours).       Radiology Studies: No results found.      Scheduled Meds:  aspirin  81 mg Oral Daily   atorvastatin  40 mg Oral QHS   docusate sodium  100 mg Oral BID   enoxaparin (LOVENOX) injection  30 mg Subcutaneous Q24H   insulin aspart  0-5 Units Subcutaneous QHS   insulin aspart  0-9 Units Subcutaneous TID WC   insulin glargine-yfgn  6 Units Subcutaneous QHS    isosorbide mononitrate  60 mg Oral Daily   metoprolol succinate  50 mg Oral Daily   mirtazapine  15 mg Oral QHS   pantoprazole  40 mg Oral Daily   polyethylene glycol  17 g Oral BID   QUEtiapine  200 mg Oral QHS   senna-docusate  2 tablet Oral QHS   Continuous Infusions:     LOS: 4 days    Time spent: 35 minutes     Dorcas Carrow, MD [facility]

## 2023-01-24 NOTE — Progress Notes (Signed)
Mobility Specialist Progress Note:    01/24/23 1500  Mobility  Activity Ambulated with assistance in hallway  Level of Assistance Contact guard assist, steadying assist  Assistive Device Four wheel walker  Distance Ambulated (ft) 60 ft  Activity Response Tolerated well  Mobility Referral Yes  Mobility visit 1 Mobility  Mobility Specialist Start Time (ACUTE ONLY) 1454  Mobility Specialist Stop Time (ACUTE ONLY) 1508  Mobility Specialist Time Calculation (min) (ACUTE ONLY) 14 min   Pt received in bed, agreeable to mobility. Only requiring minG throughout. Took x2 seated rest breaks d/t fatigue. VSS throughout. Pt left in bed with call bell and all needs met. Family present.  D'Vante Earlene Plater Mobility Specialist Please contact via Special educational needs teacher or Rehab office at [phone]

## 2023-01-25 DIAGNOSIS — N179 Acute kidney failure, unspecified: Secondary | ICD-10-CM | POA: Diagnosis not present

## 2023-01-25 DIAGNOSIS — Z7189 Other specified counseling: Secondary | ICD-10-CM | POA: Diagnosis not present

## 2023-01-25 DIAGNOSIS — I25110 Atherosclerotic heart disease of native coronary artery with unstable angina pectoris: Secondary | ICD-10-CM

## 2023-01-25 DIAGNOSIS — I214 Non-ST elevation (NSTEMI) myocardial infarction: Secondary | ICD-10-CM | POA: Diagnosis not present

## 2023-01-25 DIAGNOSIS — Z515 Encounter for palliative care: Secondary | ICD-10-CM | POA: Diagnosis not present

## 2023-01-25 LAB — CBC
HCT: 25.5 % — ABNORMAL LOW (ref 36.0–46.0)
Hemoglobin: 8.5 g/dL — ABNORMAL LOW (ref 12.0–15.0)
MCH: 34.8 pg — ABNORMAL HIGH (ref 26.0–34.0)
MCHC: 33.3 g/dL (ref 30.0–36.0)
MCV: 104.5 fL — ABNORMAL HIGH (ref 80.0–100.0)
Platelets: 104 10*3/uL — ABNORMAL LOW (ref 150–400)
RBC: 2.44 MIL/uL — ABNORMAL LOW (ref 3.87–5.11)
RDW: 13.3 % (ref 11.5–15.5)
WBC: 5.8 10*3/uL (ref 4.0–10.5)
nRBC: 0 % (ref 0.0–0.2)

## 2023-01-25 LAB — GLUCOSE, CAPILLARY
Glucose-Capillary: 234 mg/dL — ABNORMAL HIGH (ref 70–99)
Glucose-Capillary: 310 mg/dL — ABNORMAL HIGH (ref 70–99)
Glucose-Capillary: 267 mg/dL — ABNORMAL HIGH (ref 70–99)
Glucose-Capillary: 210 mg/dL — ABNORMAL HIGH (ref 70–99)

## 2023-01-25 LAB — BASIC METABOLIC PANEL
Anion gap: 12 (ref 5–15)
BUN: 58 mg/dL — ABNORMAL HIGH (ref 8–23)
CO2: 18 mmol/L — ABNORMAL LOW (ref 22–32)
Calcium: 9 mg/dL (ref 8.9–10.3)
Chloride: 100 mmol/L (ref 98–111)
Creatinine, Ser: 2.99 mg/dL — ABNORMAL HIGH (ref 0.44–1.00)
GFR, Estimated: 14 mL/min — ABNORMAL LOW (ref >60)
Glucose, Bld: 167 mg/dL — ABNORMAL HIGH (ref 70–99)
Potassium: 4.2 mmol/L (ref 3.5–5.1)
Sodium: 130 mmol/L — ABNORMAL LOW (ref 135–145)

## 2023-01-25 MED ORDER — FENTANYL 12 MCG/HR TD PT72
1.0000 | MEDICATED_PATCH | TRANSDERMAL | Status: DC
  Administered 2023-01-25: 1 via TRANSDERMAL
  Filled 2023-01-25: qty 1

## 2023-01-25 MED ORDER — INSULIN ASPART 100 UNIT/ML IJ SOLN
2.0000 [IU] | Freq: Three times a day (TID) | INTRAMUSCULAR | Status: DC
  Administered 2023-01-25 – 2023-01-26 (×4): 2 [IU] via SUBCUTANEOUS

## 2023-01-25 MED ORDER — HYDROMORPHONE HCL 1 MG/ML IJ SOLN: 0.5000 mg | INTRAMUSCULAR | Status: DC | PRN

## 2023-01-25 MED ORDER — GLUCERNA SHAKE PO LIQD
237.0000 mL | Freq: Three times a day (TID) | ORAL | Status: DC
  Administered 2023-01-25: 237 mL via ORAL

## 2023-01-25 MED ORDER — HYDROMORPHONE HCL 1 MG/ML IJ SOLN
0.5000 mg | INTRAMUSCULAR | Status: DC | PRN
  Administered 2023-01-26: 0.5 mg via INTRAVENOUS
  Filled 2023-01-25: qty 1

## 2023-01-25 MED ORDER — FAMOTIDINE 20 MG PO TABS
20.0000 mg | ORAL_TABLET | Freq: Two times a day (BID) | ORAL | Status: DC | PRN
  Administered 2023-01-25: 20 mg via ORAL
  Filled 2023-01-25: qty 1

## 2023-01-25 MED ORDER — OXYCODONE HCL 5 MG PO TABS: 5.0000 mg | ORAL_TABLET | ORAL | Status: DC | PRN

## 2023-01-25 MED ORDER — INSULIN ASPART 100 UNIT/ML IJ SOLN
0.0000 [IU] | Freq: Every day | INTRAMUSCULAR | Status: DC
  Administered 2023-01-25: 2 [IU] via SUBCUTANEOUS

## 2023-01-25 MED ORDER — CALCIUM CARBONATE ANTACID 500 MG PO CHEW
1.0000 | CHEWABLE_TABLET | Freq: Three times a day (TID) | ORAL | Status: DC
  Administered 2023-01-25 – 2023-01-26 (×4): 200 mg via ORAL
  Filled 2023-01-25 (×4): qty 1

## 2023-01-25 MED ORDER — PANTOPRAZOLE SODIUM 40 MG PO TBEC
40.0000 mg | DELAYED_RELEASE_TABLET | Freq: Two times a day (BID) | ORAL | Status: DC
  Administered 2023-01-25 – 2023-01-26 (×3): 40 mg via ORAL
  Filled 2023-01-25 (×2): qty 1

## 2023-01-25 MED ORDER — INSULIN ASPART 100 UNIT/ML IJ SOLN
0.0000 [IU] | Freq: Three times a day (TID) | INTRAMUSCULAR | Status: DC
  Administered 2023-01-25: 5 [IU] via SUBCUTANEOUS
  Administered 2023-01-25: 7 [IU] via SUBCUTANEOUS
  Administered 2023-01-26: 5 [IU] via SUBCUTANEOUS
  Administered 2023-01-26: 7 [IU] via SUBCUTANEOUS

## 2023-01-25 NOTE — Progress Notes (Signed)
Mobility Specialist Progress Note:   01/25/23 0930  Mobility  Activity Transferred from bed to chair  Level of Assistance Contact guard assist, steadying assist  Assistive Device Other (Comment) (HHA)  Distance Ambulated (ft) 5 ft  Activity Response Tolerated well  Mobility Referral Yes  Mobility visit 1 Mobility  Mobility Specialist Start Time (ACUTE ONLY) 0930  Mobility Specialist Stop Time (ACUTE ONLY) 0940  Mobility Specialist Time Calculation (min) (ACUTE ONLY) 10 min   Pt agreeable to mobility session, however declines all ambulation at this time d/t indigestion. Required CGA via HHA for safety. Pt left in chair with all needs met.   Addison Lank Mobility Specialist Please contact via SecureChat or  Rehab office at [phone]

## 2023-01-25 NOTE — Progress Notes (Signed)
Heart Failure Navigator Progress Note  Assessed for Heart & Vascular TOC clinic readiness.  Patient with Ef 35-40%, has a scheduled [facility] appointment on 02/15/2023. .   Navigator will sign off at this time.   Rhae Hammock, BSN, Scientist, clinical (histocompatibility and immunogenetics) Only

## 2023-01-25 NOTE — Consult Note (Signed)
Palliative Care Consult Note                                  Date: 01/25/2023   Patient Name: Karen [name]  DOB: Aug 05, 1928  MRN: [id number]  Age / Sex: 89 y.o., female  PCP: Irena Reichmann, DO Referring Physician: Dorcas Carrow, MD  Reason for Consultation: Establishing goals of care  HPI/Patient Profile: 89 y.o. female  with past medical history of chronic diastolic heart failure, type 2 diabetes, CKD stage IV with baseline creatinine 1.8, and hypertension who presented to the ED on 01/20/2023 with chest pain and shortness of breath.  She was admitted with non-STEMI and AKI.  Seen by cardiology, who recommended medical management as she is not a good candidate for cardiac cath.  Palliative Medicine was consulted for goals of care and medical decision making  Subjective:   I have reviewed medical records including EPIC notes, labs and imaging.  Received an update from the RN.  I met with patient and her son/Dave at bedside to discuss diagnosis, prognosis, GOC, EOL wishes, disposition, and options.  I introduced Palliative Medicine as specialized medical care for people living with serious illness. It focuses on providing relief from the symptoms and stress of a serious illness.   Created space and opportunity for patient and family to express thoughts and feelings regarding current medical situation. Values and goals of care were attempted to be elicited.  We discussed patient's current illness and what it means in the larger context of her ongoing co-morbidities. Current clinical status was reviewed.  Patient tells me that her heart and kidneys are failing, and that "this is no quality of life".   Home hospice services were explained and offered. Discussed that hospice is a Medicare benefit that can help patients optimize their quality of life in the setting of advanced illness, while allowing the natural course to occur, Discussed  that hospice can help with personal care and provide symptom management.   Patient is familiar with hospice services and feels this would be congruent with her goals of care. She is clear that she wants to focus on quality of life and symptom management for the time she has left.   Discussed symptom management at length. Patient reports ongoing periodic episodes of shortness of breath. She reports significant distress when these episodes occur. Discussed the indication for opioids to relieve dyspnea in the setting of advanced/end-stage illness.   Questions and concerns addressed. Patient/family encouraged to call with questions or concerns.     Review of Systems  Respiratory:  Positive for shortness of breath.     Objective:   Primary Diagnoses: Present on Admission:  Acute on chronic diastolic heart failure (HCC)  Acute renal failure superimposed on stage 4 chronic kidney disease (HCC)  Hyperkalemia  Essential hypertension  Macrocytic anemia  Type 2 diabetes mellitus with hyperlipidemia (HCC)  Hypokalemia   Physical Exam Vitals reviewed.  Constitutional:      General: She is not in acute distress.    Comments: Frail  Pulmonary:     Effort: No respiratory distress.  Neurological:     Mental Status: She is alert and oriented to person, place, and time.     Palliative Assessment/Data: PPS 50%     Assessment & Plan:   SUMMARY OF RECOMMENDATIONS   Referral for home hospice - patient and son express interest in [facility] Plan  to optimize symptom management prior to discharge PMT will follow-up tomorrow   Primary Decision Maker: PATIENT  Code Status/Advance Care Planning: DNR  Symptom Management:  Fentanyl 12 mcg patch every 72 hours Oxycodone IR 5 mg every 4 hours as needed for pain or dyspnea  Prognosis:  < 6 months  Discharge Planning:  Home with Hospice   Discussed with: Dr. Jerral Ralph, RN, TOC   Thank you for allowing [place] to participate  in the care of Lavonne Maberry Pfluger   Time Total: 80 minutes  Detailed review of medical records (labs, imaging, vital signs), medically appropriate exam, discussed with treatment team, counseling and education to patient, family, & staff, documenting clinical information, medication management, coordination of care.   Signed by: Sherlean Foot, NP Palliative Medicine Team  Team Phone # [phone]  For individual providers, please see AMION

## 2023-01-25 NOTE — Progress Notes (Signed)
PROGRESS NOTE    SHALEYA GORDER  JYN:[id number] DOB: Jun 19, 1928 DOA: 01/20/2023 PCP: Irena Reichmann, DO    Brief Narrative:  89 year old female with history of chronic diastolic heart failure, type 2 diabetes, hypertension, CKD stage IV with baseline creatinine was 1.8 who is admitted to the hospital with chest pain and shortness of breath.  She was found to have regional wall motion abnormality, worsening ejection fraction and non-STEMI.  Not an ideal candidate for cardiac cath.  Remains on medical management.  Subjective: Patient seen and examined.  Patient complains of feeling like indigestion overnight multiple episodes.  She had good bowel movements overnight.  Patient understands that she is having heart issues/heart attack and cannot have cardiac cath. Cardiology discussed with her, changed to DNR comfort care. Palliative consulted.  She may be appropriate to go home with home hospice program.   Assessment & Plan:   Non-ST elevation MI: Still with intermittent pain and indigestion. Echocardiogram consistent with ejection fraction 35 to 40%, left ventricle with regional wall motion abnormality, akinetic and aneurysmal apex.  Troponins elevated.  Patient unable to have cardiac cath with CKD and previous severe reaction to contrast.  Recommended medical management.  Cardiology is following. Currently on aspirin, statin, Imdur, metoprolol.   Patient with non-STEMI, depressed ejection fraction.  Will involve palliative care team.  AKI on CKD stage IV: Baseline creatinine about 1.8.  Peaked at 3.43.  Remains on gentle IV fluids, holding Lasix and valsartan.  Creatinine is trending down.   2.99 today.  Type 2 diabetes: On insulin.  Fairly stable today.  Will allow hyperglycemia.  Constipation : Significant issue.  She is on MiraLAX twice daily.  Will give additional Dulcolax today.  Indigestion: On Protonix.  Increase dose to twice daily.  Will add scheduled calcium carbonate with  meals.     DVT prophylaxis: enoxaparin (LOVENOX) injection 30 mg Start: 01/23/23 1515 SCDs Start: 01/23/23 1416 Place and maintain sequential compression device Start: 01/23/23 1416   Code Status: Full code. Family Communication: None at the bedside Disposition Plan: Status is: Inpatient Remains inpatient appropriate because: Medication adjustment, palliative plan.     Consultants:  Cardiology Palliative care, called  Procedures:  None  Antimicrobials:  None     Objective: Vitals:   01/24/23 1930 01/24/23 2300 01/25/23 0539 01/25/23 1153  BP:  103/65 114/72 118/71  Pulse:  82 (!) 101 89  Resp:  19 20 20   Temp:  98.7 F (37.1 C) 97.7 F (36.5 C) 98.2 F (36.8 C)  TempSrc:  Oral Oral Oral  SpO2: 100% 96% 98% 100%  Weight:   64.4 kg   Height:        Intake/Output Summary (Last 24 hours) at 01/25/2023 1248 Last data filed at 01/25/2023 1153 Gross per 24 hour  Intake 833 ml  Output 700 ml  Net 133 ml   Filed Weights   01/23/23 0257 01/24/23 0445 01/25/23 0539  Weight: 62 kg 63.7 kg 64.4 kg    Examination:  General exam: Appears calm and comfortable.  Appropriately anxious. Pleasant and interactive. Respiratory system: Clear to auscultation. Respiratory effort normal.  Poor air entry at bases. Cardiovascular system: S1 & S2 heard, RRR.  No edema. Gastrointestinal system: Soft nontender.  Bowel sound present.     Data Reviewed: I have personally reviewed following labs and imaging studies  CBC: Recent Labs  Lab 01/20/23 1400 01/21/23 0442 01/22/23 0301 01/23/23 0518 01/23/23 1447 01/24/23 0436 01/25/23 0350  WBC 8.1   < >  5.8 5.3 5.3 5.3 5.8  NEUTROABS 6.6  --   --   --   --   --   --   HGB 10.4*   < > 9.1* 8.2* 8.9* 8.5* 8.5*  HCT 32.1*   < > 27.2* 24.4* 26.1* 24.8* 25.5*  MCV 109.6*   < > 104.6* 103.8* 104.0* 103.8* 104.5*  PLT 115*   < > 109* 107* 118* 108* 104*   < > = values in this interval not displayed.   Basic Metabolic  Panel: Recent Labs  Lab 01/20/23 2333 01/21/23 0442 01/22/23 0932 01/23/23 0518 01/23/23 1447 01/24/23 0436 01/25/23 0350  NA 136 134* 134* 134*  --  133* 130*  K 4.6 4.0 3.4* 3.6  --  4.5 4.2  CL 102 102 99 101  --  103 100  CO2 19* 20* 22 20*  --  22 18*  GLUCOSE 231* 242* 261* 176*  --  233* 167*  BUN 69* 68* 68* 66*  --  60* 58*  CREATININE 2.59* 3.02* 3.43* 3.26* 3.42* 2.96* 2.99*  CALCIUM 9.7 9.4 9.3 8.7*  --  9.1 9.0  MG 2.3 2.2  --   --   --   --   --    GFR: Estimated Creatinine Clearance: 10.4 mL/min (A) (by C-G formula based on SCr of 2.99 mg/dL (H)). Liver Function Tests: Recent Labs  Lab 01/21/23 0442  AST 20  ALT 16  ALKPHOS 56  BILITOT 0.5  PROT 6.5  ALBUMIN 3.2*   No results for input(s): "LIPASE", "AMYLASE" in the last 168 hours. No results for input(s): "AMMONIA" in the last 168 hours. Coagulation Profile: No results for input(s): "INR", "PROTIME" in the last 168 hours. Cardiac Enzymes: No results for input(s): "CKTOTAL", "CKMB", "CKMBINDEX", "TROPONINI" in the last 168 hours. BNP (last 3 results) No results for input(s): "PROBNP" in the last 8760 hours. HbA1C: No results for input(s): "HGBA1C" in the last 72 hours. CBG: Recent Labs  Lab 01/24/23 1114 01/24/23 1554 01/24/23 2115 01/25/23 0716 01/25/23 1138  GLUCAP 177* 210* 203* 234* 310*   Lipid Profile: Recent Labs    01/23/23 0518  CHOL 108  HDL 39*  LDLCALC 50  TRIG 95  CHOLHDL 2.8   Thyroid Function Tests: No results for input(s): "TSH", "T4TOTAL", "FREET4", "T3FREE", "THYROIDAB" in the last 72 hours. Anemia Panel: Recent Labs    01/23/23 0518  VITAMINB12 214   Sepsis Labs: No results for input(s): "PROCALCITON", "LATICACIDVEN" in the last 168 hours.  No results found for this or any previous visit (from the past 240 hours).       Radiology Studies: No results found.      Scheduled Meds:  aspirin  81 mg Oral Daily   atorvastatin  40 mg Oral QHS   calcium  carbonate  1 tablet Oral TID WC   docusate sodium  100 mg Oral BID   enoxaparin (LOVENOX) injection  30 mg Subcutaneous Q24H   feeding supplement (GLUCERNA SHAKE)  237 mL Oral TID BM   insulin aspart  0-5 Units Subcutaneous QHS   insulin aspart  0-9 Units Subcutaneous TID WC   insulin aspart  2 Units Subcutaneous TID WC   insulin glargine-yfgn  6 Units Subcutaneous QHS   isosorbide mononitrate  60 mg Oral Daily   metoprolol succinate  50 mg Oral Daily   mirtazapine  15 mg Oral QHS   pantoprazole  40 mg Oral BID   polyethylene glycol  17 g Oral  BID   QUEtiapine  200 mg Oral QHS   senna-docusate  2 tablet Oral QHS   Continuous Infusions:     LOS: 5 days    Time spent: 35 minutes     Dorcas Carrow, MD [facility]

## 2023-01-25 NOTE — Progress Notes (Addendum)
Pt was working with Pt, while having a BM, pt bradyed down into 40's. Pt stated she feels light headed and short of breath. After BM, pts HR came back up to the 100's.   RN messaged Ronie Spies, Pa with cardiology. Plan to keep pt in bed until physician rounds    1555: pt bradyed down again, HR at 35 for about 30 seconds before coming up. Pt was SOB and lightheaded   1818: pt  bradyed down to 38 while moving around in bed

## 2023-01-25 NOTE — Progress Notes (Signed)
Physical Therapy Treatment Patient Details Name: TANA MYERS MRN: [id number] DOB: [birth year] Today's Date: 01/25/2023   History of Present Illness 89 y.o. female admitted 01/20/23 with NSTEMI, AKI on CKD. PET scan concerning for CAD; plan for medical management. PMH includes CHF, DM2, HTN, CKD IV, anemia, gout, fibromyalgia, TKA/THA.    PT Comments  Patient resting in recliner at start of session and requesting urgent trip to bathroom to void B&B. CGA for safety provided with sit<>stand and gait to amb in room to bathroom. Pt able to negotiate bathroom with rollator with cues for sequencing. On toilet pt c/o dizziness/weak/lightheaded sensation. HR noted to become bradycardic in 30's and RN notified. Cues for pt to breath and prevent valsalva and HR recovered to 60's-100's. Following voiding pt required Min assist to stand and pivot then sequence small steps back to recliner close to bathroom door. Pt reporting feeling much better and eager to brush teeth and wash up body at sink. Set up assist provided for oral care and pt performed with supervision. RN and OT arrived to room and OT assisting pt with further self care and plan for return to bed. Will continue to progress pt as able during stay.     If plan is discharge home, recommend the following: A little help with bathing/dressing/bathroom;Assistance with cooking/housework;Assist for transportation;Help with stairs or ramp for entrance   Can travel by private vehicle        Equipment Recommendations  None recommended by PT    Recommendations for Other Services       Precautions / Restrictions Precautions Precautions: Fall;Other (comment) Precaution Comments: Monitor HR and BP Restrictions Weight Bearing Restrictions Per Provider Order: No     Mobility  Bed Mobility               General bed mobility comments: pt OOB at start and end    Transfers Overall transfer level: Needs assistance Equipment used:  None Transfers: Sit to/from Stand Sit to Stand: Contact guard assist   Step pivot transfers: Contact guard assist, Min assist       General transfer comment: CGA for sfaety with rise from recliner and lower to toilet. EOS pt fatigued and limited by dizziness/weak sensation. Min assist to rise and pivot then take steps back to recliner positioned outside bathroom door.    Ambulation/Gait Ambulation/Gait assistance: Contact guard assist Gait Distance (Feet): 15 Feet Assistive device: Rollator (4 wheels) Gait Pattern/deviations: Step-through pattern, Decreased stride length Gait velocity: decr     General Gait Details: pt maintained safe position to rollator, overall pace slow/cautious. pt able to negotiate tight doorway and space of bathroom with rollator.   Stairs             Wheelchair Mobility     Tilt Bed    Modified Rankin (Stroke Patients Only)       Balance Overall balance assessment: Needs assistance Sitting-balance support: No upper extremity supported, Feet supported Sitting balance-Leahy Scale: Good     Standing balance support: No upper extremity supported, During functional activity Standing balance-Leahy Scale: Fair                              Cognition Arousal: Alert Behavior During Therapy: WFL for tasks assessed/performed Overall Cognitive Status: Within Functional Limits for tasks assessed  General Comments: WFL for simple tasks; some repetition in conversation otherwise very sharp        Exercises      General Comments General comments (skin integrity, edema, etc.): Pt with suspected vagal episode in bathroom c/o dizzy, lightheaded, SOB, HR bradycardic in 30's.      Pertinent Vitals/Pain Pain Assessment Pain Assessment: No/denies pain    Home Living                          Prior Function            PT Goals (current goals can now be found in the care  plan section) Acute Rehab PT Goals PT Goal Formulation: With patient/family Time For Goal Achievement: 02/06/23 Potential to Achieve Goals: Good Progress towards PT goals: Progressing toward goals    Frequency    Min 1X/week      PT Plan      Co-evaluation              AM-PAC PT "6 Clicks" Mobility   Outcome Measure  Help needed turning from your back to your side while in a flat bed without using bedrails?: None Help needed moving from lying on your back to sitting on the side of a flat bed without using bedrails?: None Help needed moving to and from a bed to a chair (including a wheelchair)?: A Little Help needed standing up from a chair using your arms (e.g., wheelchair or bedside chair)?: A Little Help needed to walk in hospital room?: A Little Help needed climbing 3-5 steps with a railing? : A Lot 6 Click Score: 19    End of Session Equipment Utilized During Treatment: Gait belt Activity Tolerance: Patient tolerated treatment well;Other (comment);Treatment limited secondary to medical complications (Comment) (possible vagal episode) Patient left: in chair;with call bell/phone within reach;with nursing/sitter in room;Other (comment) (OT present) Nurse Communication: Mobility status;Other (comment) (vagal episode) PT Visit Diagnosis: Other abnormalities of gait and mobility (R26.89);Muscle weakness (generalized) (M62.81)     Time: 4098-1191 PT Time Calculation (min) (ACUTE ONLY): 18 min  Charges:    $Therapeutic Activity: 8-22 mins PT General Charges $$ ACUTE PT VISIT: 1 Visit                     Wynn Maudlin, DPT Acute Rehabilitation Services Office [phone]  01/25/23 4:21 PM

## 2023-01-25 NOTE — Progress Notes (Addendum)
Patient had another wide complex brady episode (similar to 2 yesterday, 1 this AM) while on BSC, HR fluctuated 30s-50s with brief reflex narrow complex tachycardia before returning to NSR. We'll pause her metoprolol going forward. Nurse inquired whether OK for purewick, I replied yes.  D/w Dr. Lynnette Caffey given Dr. Elease Hashimoto was half day. Otherwise no specific new recs - appreciate when palliative care is seen. We anticipate comfort based approach without further procedures. Dr Elease Hashimoto had long discussion with patient and son today about code status and concerning clinical situation. Dr. Jerral Ralph updated.

## 2023-01-25 NOTE — Progress Notes (Signed)
Occupational Therapy Treatment Patient Details Name: Karen [name] MRN: [id number] DOB: 10-Nov-1928 Today's Date: 01/25/2023   History of present illness 89 y.o. female admitted 01/20/23 with NSTEMI, AKI on CKD. PET scan concerning for CAD; plan for medical management. PMH includes CHF, DM2, HTN, CKD IV, anemia, gout, fibromyalgia, TKA/THA.   OT comments  Pt progressing towards established OT goals. Upon arrival, pt sitting in recliner having just worked with PT and walked to bathroom. Pt wanting to perform sponge bath prior to returning to bed. Pt completing UB bathing/dressing with set up and supervision. Pt performing LB bathing with CGA for safety and Min A for LB dressing. Returning to bed and supine directly after sponge bath. HR 90s throughout bath. MD arriving at end of session. Will continue to follow acutely as admitted and continue to recommend dc home with family once medically ready per physician.      If plan is discharge home, recommend the following:  Assistance with cooking/housework   Equipment Recommendations  None recommended by OT    Recommendations for Other Services      Precautions / Restrictions Precautions Precautions: Fall;Other (comment) Precaution Comments: Monitor HR and BP Restrictions Weight Bearing Restrictions Per Provider Order: No       Mobility Bed Mobility Overal bed mobility: Independent                  Transfers Overall transfer level: Needs assistance Equipment used: None Transfers: Sit to/from Stand Sit to Stand: Contact guard assist     Step pivot transfers: Contact guard assist     General transfer comment: CGA for safety     Balance Overall balance assessment: Needs assistance Sitting-balance support: No upper extremity supported, Feet supported Sitting balance-Leahy Scale: Good     Standing balance support: No upper extremity supported, During functional activity Standing balance-Leahy Scale: Fair                              ADL either performed or assessed with clinical judgement   ADL Overall ADL's : Needs assistance/impaired         Upper Body Bathing: Set up;Sitting;Supervision/ safety   Lower Body Bathing: Contact guard assist;Sit to/from stand   Upper Body Dressing : Set up;Supervision/safety;Sitting   Lower Body Dressing: Minimal assistance;Sit to/from stand Lower Body Dressing Details (indicate cue type and reason): Min A for threading feet through underwear Toilet Transfer: Contact guard assist;Stand-pivot (simulated to EOB)           Functional mobility during ADLs: Contact guard assist (stand pivot) General ADL Comments: Upon arrival, pt sitting in recliner having just worked with PT and walked to bathroom. Pt wanting to perform sponge bath prior to returning to bed. Pt completing UB bathing/dressing with set up and supervision. Pt performing LB bathing with CGA for safety and Min A for LB dressing. Returnign to bed and supine directly after sponge bath. MD arriving at end of session.    Extremity/Trunk Assessment Upper Extremity Assessment Upper Extremity Assessment: Generalized weakness (h/o R rotator cuff injury with limited overhead AROM)   Lower Extremity Assessment Lower Extremity Assessment: Generalized weakness        Vision       Perception     Praxis      Cognition Arousal: Alert Behavior During Therapy: WFL for tasks assessed/performed Overall Cognitive Status: Within Functional Limits for tasks assessed  General Comments: WFL for simple tasks; some repetition in conversation otherwise very sharp        Exercises      Shoulder Instructions       General Comments BP 130/86 and HR 97    Pertinent Vitals/ Pain       Pain Assessment Pain Assessment: No/denies pain  Home Living                                          Prior Functioning/Environment               Frequency  Min 1X/week        Progress Toward Goals  OT Goals(current goals can now be found in the care plan section)  Progress towards OT goals: Progressing toward goals (Slowly)  Acute Rehab OT Goals OT Goal Formulation: With patient Time For Goal Achievement: 02/06/23 Potential to Achieve Goals: Good ADL Goals Pt Will Perform Grooming: with modified independence;standing Pt Will Perform Lower Body Dressing: with modified independence;sit to/from stand Pt Will Transfer to Toilet: with modified independence;ambulating;regular height toilet  Plan      Co-evaluation                 AM-PAC OT "6 Clicks" Daily Activity     Outcome Measure   Help from another person eating meals?: None Help from another person taking care of personal grooming?: None Help from another person toileting, which includes using toliet, bedpan, or urinal?: A Little Help from another person bathing (including washing, rinsing, drying)?: A Little Help from another person to put on and taking off regular upper body clothing?: A Little Help from another person to put on and taking off regular lower body clothing?: A Little 6 Click Score: 20    End of Session    OT Visit Diagnosis: Unsteadiness on feet (R26.81);Other abnormalities of gait and mobility (R26.89);Muscle weakness (generalized) (M62.81)   Activity Tolerance Patient tolerated treatment well   Patient Left with call bell/phone within reach;in bed;with family/visitor present (MD)   Nurse Communication Mobility status        Time: 1010-1030 OT Time Calculation (min): 20 min  Charges: OT General Charges $OT Visit: 1 Visit OT Treatments $Self Care/Home Management : 8-22 mins  Karen [name] MSOT, OTR/L Acute Rehab Office: [phone]  Karen [name] 01/25/2023, 11:09 AM

## 2023-01-25 NOTE — Progress Notes (Addendum)
Progress Note  Patient Name: Karen [name] Date of Encounter: 01/25/2023  Primary Cardiologist: Thurmon Fair, MD  Subjective   Had indigestion this AM improved with Maalox. Feels more SOB, weak this morning. She feels as though if she even tries to get out of bed she will have significant SOB.HR ~low 100s this AM.  Inpatient Medications    Scheduled Meds:  aspirin  81 mg Oral Daily   atorvastatin  40 mg Oral QHS   docusate sodium  100 mg Oral BID   enoxaparin (LOVENOX) injection  30 mg Subcutaneous Q24H   insulin aspart  0-5 Units Subcutaneous QHS   insulin aspart  0-9 Units Subcutaneous TID WC   insulin glargine-yfgn  6 Units Subcutaneous QHS   isosorbide mononitrate  60 mg Oral Daily   metoprolol succinate  50 mg Oral Daily   mirtazapine  15 mg Oral QHS   pantoprazole  40 mg Oral Daily   polyethylene glycol  17 g Oral BID   QUEtiapine  200 mg Oral QHS   senna-docusate  2 tablet Oral QHS   Continuous Infusions:  PRN Meds: acetaminophen **OR** acetaminophen, alum & mag hydroxide-simeth, bisacodyl, melatonin, nitroGLYCERIN, ondansetron (ZOFRAN) IV, polyvinyl alcohol   Vital Signs    Vitals:   01/24/23 1927 01/24/23 1930 01/24/23 2300 01/25/23 0539  BP: 127/72  103/65 114/72  Pulse: 87  82 (!) 101  Resp: 20  19 20   Temp: 98 F (36.7 C)  98.7 F (37.1 C) 97.7 F (36.5 C)  TempSrc: Oral  Oral Oral  SpO2: 92% 100% 96% 98%  Weight:    64.4 kg  Height:        Intake/Output Summary (Last 24 hours) at 01/25/2023 0842 Last data filed at 01/25/2023 0751 Gross per 24 hour  Intake 833 ml  Output 800 ml  Net 33 ml      01/25/2023    5:39 AM 01/24/2023    4:45 AM 01/23/2023    2:57 AM  Last 3 Weights  Weight (lbs) 141 lb 14.4 oz 140 lb 6.9 oz 136 lb 9.6 oz  Weight (kg) 64.365 kg 63.7 kg 61.961 kg     Telemetry    Sinus rhythm with 2 episodes of brief slow ventricular rhythm yesterday several hours apart - question junctional vs slow AIVR - Personally  Reviewed  EKG ordered and pending  Physical Exam   GEN: No acute distress.  HEENT: Normocephalic, atraumatic, sclera non-icteric. Neck: No JVD or bruits. Cardiac: RRR soft holosystolic SEM at LSB with S3 component.  Respiratory: Clear to auscultation bilaterally. Breathing is unlabored. GI: Soft, nontender, non-distended, BS +x 4. MS: no deformity. Extremities: No clubbing or cyanosis. No edema. Distal pedal pulses are 2+ and equal bilaterally. Neuro:  AAOx3. Follows commands. Psych:  Responds to questions appropriately with a normal affect.  Labs    High Sensitivity Troponin:   Recent Labs  Lab 01/20/23 1400 01/20/23 1540 01/21/23 0442  TROPONINIHS 481* 838* 1,134*      Cardiac EnzymesNo results for input(s): "TROPONINI" in the last 168 hours. No results for input(s): "TROPIPOC" in the last 168 hours.   Chemistry Recent Labs  Lab 01/21/23 0442 01/22/23 0932 01/23/23 0518 01/23/23 1447 01/24/23 0436 01/25/23 0350  NA 134*   < > 134*  --  133* 130*  K 4.0   < > 3.6  --  4.5 4.2  CL 102   < > 101  --  103 100  CO2 20*   < >  20*  --  22 18*  GLUCOSE 242*   < > 176*  --  233* 167*  BUN 68*   < > 66*  --  60* 58*  CREATININE 3.02*   < > 3.26* 3.42* 2.96* 2.99*  CALCIUM 9.4   < > 8.7*  --  9.1 9.0  PROT 6.5  --   --   --   --   --   ALBUMIN 3.2*  --   --   --   --   --   AST 20  --   --   --   --   --   ALT 16  --   --   --   --   --   ALKPHOS 56  --   --   --   --   --   BILITOT 0.5  --   --   --   --   --   GFRNONAA 14*   < > 13* 12* 14* 14*  ANIONGAP 12   < > 13  --  8 12   < > = values in this interval not displayed.     Hematology Recent Labs  Lab 01/23/23 1447 01/24/23 0436 01/25/23 0350  WBC 5.3 5.3 5.8  RBC 2.51* 2.39* 2.44*  HGB 8.9* 8.5* 8.5*  HCT 26.1* 24.8* 25.5*  MCV 104.0* 103.8* 104.5*  MCH 35.5* 35.6* 34.8*  MCHC 34.1 34.3 33.3  RDW 13.2 13.2 13.3  PLT 118* 108* 104*    BNP Recent Labs  Lab 01/20/23 1400  BNP 1,734.4*      DDimer No results for input(s): "DDIMER" in the last 168 hours.   Radiology    No results found.  Cardiac Studies   2d echo 01/2023   1. Left ventricular ejection fraction, by estimation, is 35 to 40%. The  left ventricle has moderately decreased function. The left ventricle  demonstrates regional wall motion abnormalities - akinetic, aneurysmal  apex and septum with hyperdynamic lateral   wall. There is moderate asymmetric left ventricular hypertrophy of the  basal-septal segment and aorta angulation. Left ventricular diastolic  parameters are indeterminate.   2. Right ventricular systolic function is normal. The right ventricular  size is normal. There is normal pulmonary artery systolic pressure. The  estimated right ventricular systolic pressure is 33.0 mmHg.   3. Left atrial size was mildly dilated.   4. The mitral valve is degenerative. Mild mitral valve regurgitation.  Mild mitral stenosis. The mean mitral valve gradient is 4.4 mmHg with  average heart rate of 80 bpm. Severe mitral annular calcification.   5. The aortic valve is abnormal. There is moderate calcification of the  aortic valve. Aortic valve regurgitation is trivial. Aortic valve  sclerosis/calcification is present, without any evidence of aortic  stenosis.   6. The inferior vena cava is normal in size with greater than 50%  respiratory variability, suggesting right atrial pressure of 3 mmHg.   7. Aortic dilatation noted. Aneurysm of the ascending aorta, measuring 49  mm. There is moderate-severe dilatation of the ascending aorta.    Patient Profile     89 y.o. female with CAD with mild-moderate CAD s/p DES to LAD/Cx in 2015 with residual disease, chronic HFpEF, HTN, ascending TAA 4.7cm 2019, CKD stage IV, DM, fibromyalgia presented this admission with NSTEMI with decision made to manage medically. Also has issues with dysphagia - hx achalasia.  Assessment & Plan    1. CAD with NSTEMI -  anginal  equivalent is typically dyspnea - prior cardiac PET 06/2021 raised question of multivessel CAD vs LM CAD - given that it was unlikely that she would have anatomy amenable to PCI, not a candidate for bypass, and has significant CKD, this was managed medically. She also has h/o inability to cath via radial artery with serious groin hemorrhage with prior cath. In light of these findings, medical therapy continued this admission - completed 48 hr of IV heparin - continue ASA 81mg  daily, anticipate to hold off adding Plavix given ongoing issues with anemia and thrombocytopenia - continue Toprol - was on 100mg  as outpatient, 50mg  here - given 2 episodes of slow ventricular rate briefly yesterday with HR 40s (junctional vs slow AIVR) will not titrate at this time - continue Imdur (titrated this admission) - not felt to be candidate for Ranexa due to risk of QT prolongation with other agents on board, also with significant renal insufficiency - IM consulted palliative care which is pending - completely agree with this, given complex disease process, age, comorbidities, would benefit from GOC discussion and discussion of strategies to focus on symptom management over procedures - given worsening complaints this AM and exam have asked Dr. Elease Hashimoto to evaluate patient in priority as well. Repeat EKG shows sinus tach 103bpm, rightward axis, NSIVCD with LBBB pattern more pronounced than prior, nonspecific STT changes with mild ST depression I, II, III, avF, V5,V6.  2. Acute HFrEF  - received diuresis earlier this admission with subsequent AKI, home ARB also stopped - not a candidate for ACEI ARB ARNI spironolactone SGLT2i acutely with AKI - follow conservatively, will review with MD - otherwise meds as above  3. AKI on CKD stage IV - historical Cr around 1.7 in 2023 without interim values, here peaking at 3.43 - she did not think she would ever want HD nor would be ideal candidate for this - seems to have  plateaued, 2.99 today - avoid nephrotoxic agents  4. Mild mitral stenosis - follow clinically  5. Ascending TAA - not a candidate for further intervention, no specific plans to re-scan  6. Anemia, thrombocytopenia - ongoing issue, appreciate IM assistance  Have tentatively arranged f/u with Marjie Skiff on 02/15/23, relayed on AVS. I think she would benefit from outpatient palliative care as well.  For questions or updates, please contact [facility] Please consult [email or web address] for contact info under Cardiology/STEMI.  Signed, Laurann Montana, PA-C 01/25/2023, 8:42 AM     Attending Note:   The patient was seen and examined.  Agree with assessment and plan as noted above.  Changes made to the above note as needed.  Patient seen and independently examined with Ronie Spies, PA .   We discussed all aspects of the encounter. I agree with the assessment and plan as stated above.     CAD :   She continues to have episodes of severe shortness of breath and generalized chest tightness.  She states that his nitroglycerin really does not work very well for her.-I have advised her to continue to take it as needed. She had a marked episode of bradycardia during a bowel movement which I suspect was due to vagal stimulation.  Her QRS widened quite a bit.  She felt better after getting back to bed.  I have explained to her and her son that I do think that these are true coronary ischemic events.  Her EKG reveals perhaps slightly worsened ST segment depression in the inferior and lateral  leads.  PET scan from last year suggest diffuse multivessel disease.  Her echocardiogram cardiogram from December 13 reveals moderate LV dysfunction with EF of 35%.  She has akinetic and aneurysmal apex and anterior septum.    I had a long discussion about code status with her and her son.  She wishes to be a NO CODE Blue.   Does not wish to have any surgery , procedure , CPR, intubation or life  prolonging interventions.  She realizes that her general condition is gradually and steadily declining .   I do agree with a palliative care approach.  We will do our best to avoid starting IV drips such as nitroglycerin and heparin as these will just prolong her hospitalization.  She would like to get home and be with her family and more comfortable place.  Continue current meds / plans     I have spent a total of 40 minutes with patient reviewing hospital  notes , telemetry, EKGs, labs and examining patient as well as establishing an assessment and plan that was discussed with the patient.  > 50% of time was spent in direct patient care.    Vesta Mixer, Montez Hageman., MD, [facility] 01/25/2023, 10:46 AM [address]  [address] Office [phone] Pager [phone]

## 2023-01-25 NOTE — Plan of Care (Signed)

## 2023-01-25 NOTE — TOC Progression Note (Signed)
Transition of Care [facility]) - Progression Note    Patient Details  Name: Karen [name] MRN: [id number] Date of Birth: 1928-06-27  Transition of Care [facility]) CM/SW Contact  Graves-Bigelow, Lamar Laundry, RN Phone Number: 01/25/2023, 3:47 PM  Clinical Narrative: Case Manager received a consult for Hospice. Patient consulted via Palliative Care- family chose [facility]. Patient will need oxygen for home. Referral submitted to [facility]. Home Health Services cancelled with [facility]. Case Manager will continue to follow for additional needs.    Expected Discharge Plan: Home w Hospice Care Barriers to Discharge: No Barriers Identified  Expected Discharge Plan and Services In-house Referral: NA Discharge Planning Services: CM Consult Post Acute Care Choice: Hospice Living arrangements for the past 2 months: Single Family Home     HH Arranged: PT HH Agency: [facility] Date HH Agency Contacted: 01/25/23 Time HH Agency Contacted: 1547 Representative spoke with at [facility] Agency: Cheri   Social Determinants of Health (SDOH) Interventions SDOH Screenings   Food Insecurity: No Food Insecurity (01/21/2023)  Housing: Low Risk  (01/21/2023)  Transportation Needs: No Transportation Needs (01/21/2023)  Utilities: Not At Risk (01/21/2023)  Depression (PHQ2-9): Low Risk  (11/30/2021)  Tobacco Use: Low Risk  (01/20/2023)   Readmission Risk Interventions    01/24/2023    3:09 PM  Readmission Risk Prevention Plan  Transportation Screening Complete  PCP or Specialist Appt within 3-5 Days Complete  HRI or Home Care Consult Complete  Social Work Consult for Recovery Care Planning/Counseling Complete  Palliative Care Screening Not Applicable  Medication Review Oceanographer) Referral to Pharmacy

## 2023-01-25 NOTE — Plan of Care (Signed)
PT has had multple episodes of bradycardia with exertion, plan for home with hospice    Problem: Education: Goal: Ability to describe self-care measures that may prevent or decrease complications (Diabetes Survival Skills Education) will improve Outcome: Progressing Goal: Individualized Educational Video(s) Outcome: Progressing   Problem: Coping: Goal: Ability to adjust to condition or change in health will improve Outcome: Progressing   Problem: Fluid Volume: Goal: Ability to maintain a balanced intake and output will improve Outcome: Progressing   Problem: Health Behavior/Discharge Planning: Goal: Ability to identify and utilize available resources and services will improve Outcome: Progressing Goal: Ability to manage health-related needs will improve Outcome: Progressing   Problem: Metabolic: Goal: Ability to maintain appropriate glucose levels will improve Outcome: Progressing   Problem: Nutritional: Goal: Maintenance of adequate nutrition will improve Outcome: Progressing Goal: Progress toward achieving an optimal weight will improve Outcome: Progressing   Problem: Skin Integrity: Goal: Risk for impaired skin integrity will decrease Outcome: Progressing   Problem: Tissue Perfusion: Goal: Adequacy of tissue perfusion will improve Outcome: Progressing   Problem: Education: Goal: Knowledge of General Education information will improve Description: Including pain rating scale, medication(s)/side effects and non-pharmacologic comfort measures Outcome: Progressing   Problem: Health Behavior/Discharge Planning: Goal: Ability to manage health-related needs will improve Outcome: Progressing   Problem: Clinical Measurements: Goal: Ability to maintain clinical measurements within normal limits will improve Outcome: Progressing Goal: Will remain free from infection Outcome: Progressing Goal: Diagnostic test results will improve Outcome: Progressing Goal: Respiratory  complications will improve Outcome: Progressing Goal: Cardiovascular complication will be avoided Outcome: Progressing   Problem: Activity: Goal: Risk for activity intolerance will decrease Outcome: Progressing   Problem: Nutrition: Goal: Adequate nutrition will be maintained Outcome: Progressing   Problem: Coping: Goal: Level of anxiety will decrease Outcome: Progressing   Problem: Elimination: Goal: Will not experience complications related to bowel motility Outcome: Progressing Goal: Will not experience complications related to urinary retention Outcome: Progressing   Problem: Pain Management: Goal: General experience of comfort will improve Outcome: Progressing   Problem: Safety: Goal: Ability to remain free from injury will improve Outcome: Progressing   Problem: Skin Integrity: Goal: Risk for impaired skin integrity will decrease Outcome: Progressing

## 2023-01-26 DIAGNOSIS — I214 Non-ST elevation (NSTEMI) myocardial infarction: Secondary | ICD-10-CM | POA: Diagnosis not present

## 2023-01-26 LAB — CBC
HCT: 26.8 % — ABNORMAL LOW (ref 36.0–46.0)
Hemoglobin: 9.1 g/dL — ABNORMAL LOW (ref 12.0–15.0)
MCH: 35.5 pg — ABNORMAL HIGH (ref 26.0–34.0)
MCHC: 34 g/dL (ref 30.0–36.0)
MCV: 104.7 fL — ABNORMAL HIGH (ref 80.0–100.0)
Platelets: 111 10*3/uL — ABNORMAL LOW (ref 150–400)
RBC: 2.56 MIL/uL — ABNORMAL LOW (ref 3.87–5.11)
RDW: 13.3 % (ref 11.5–15.5)
WBC: 6.5 10*3/uL (ref 4.0–10.5)
nRBC: 0 % (ref 0.0–0.2)

## 2023-01-26 LAB — BASIC METABOLIC PANEL
Anion gap: 13 (ref 5–15)
BUN: 59 mg/dL — ABNORMAL HIGH (ref 8–23)
CO2: 20 mmol/L — ABNORMAL LOW (ref 22–32)
Calcium: 9.4 mg/dL (ref 8.9–10.3)
Chloride: 96 mmol/L — ABNORMAL LOW (ref 98–111)
Creatinine, Ser: 2.86 mg/dL — ABNORMAL HIGH (ref 0.44–1.00)
GFR, Estimated: 15 mL/min — ABNORMAL LOW (ref >60)
Glucose, Bld: 238 mg/dL — ABNORMAL HIGH (ref 70–99)
Potassium: 5.1 mmol/L (ref 3.5–5.1)
Sodium: 129 mmol/L — ABNORMAL LOW (ref 135–145)

## 2023-01-26 LAB — GLUCOSE, CAPILLARY
Glucose-Capillary: 308 mg/dL — ABNORMAL HIGH (ref 70–99)
Glucose-Capillary: 271 mg/dL — ABNORMAL HIGH (ref 70–99)

## 2023-01-26 MED ORDER — SODIUM CHLORIDE 0.9 % IV SOLN
12.5000 mg | Freq: Four times a day (QID) | INTRAVENOUS | Status: DC | PRN
  Administered 2023-01-26: 12.5 mg via INTRAVENOUS
  Filled 2023-01-26: qty 12.5

## 2023-01-26 MED ORDER — FAMOTIDINE 20 MG PO TABS: 20.0000 mg | ORAL_TABLET | Freq: Two times a day (BID) | ORAL | Status: DC | PRN

## 2023-01-26 MED ORDER — HYDROMORPHONE HCL 1 MG/ML IJ SOLN: 0.5000 mg | INTRAMUSCULAR | Status: DC | PRN

## 2023-01-26 MED ORDER — OXYCODONE HCL 5 MG/5ML PO SOLN: 5.0000 mg | ORAL | Status: DC | PRN

## 2023-01-26 MED ORDER — CALCIUM CARBONATE ANTACID 500 MG PO CHEW: 1.0000 | CHEWABLE_TABLET | Freq: Three times a day (TID) | ORAL | Status: DC

## 2023-01-26 MED ORDER — FENTANYL 12 MCG/HR TD PT72: 1.0000 | MEDICATED_PATCH | TRANSDERMAL | Status: DC

## 2023-01-26 MED ORDER — LORAZEPAM 0.5 MG PO TABS: 0.5000 mg | ORAL_TABLET | ORAL | Status: DC | PRN

## 2023-01-26 MED ORDER — ONDANSETRON HCL 4 MG/2ML IJ SOLN
4.0000 mg | Freq: Once | INTRAMUSCULAR | Status: AC
  Administered 2023-01-26: 4 mg via INTRAVENOUS
  Filled 2023-01-26: qty 2

## 2023-01-26 MED ORDER — FENTANYL CITRATE PF 50 MCG/ML IJ SOSY
12.5000 ug | PREFILLED_SYRINGE | INTRAMUSCULAR | Status: DC | PRN
  Administered 2023-01-26: 12.5 ug via INTRAVENOUS
  Filled 2023-01-26: qty 1

## 2023-01-26 MED ORDER — PANTOPRAZOLE SODIUM 40 MG PO TBEC: 40.0000 mg | DELAYED_RELEASE_TABLET | Freq: Two times a day (BID) | ORAL | Status: DC

## 2023-01-26 MED ORDER — OXYCODONE HCL 5 MG PO TABS
5.0000 mg | ORAL_TABLET | ORAL | Status: DC | PRN
  Administered 2023-01-26: 5 mg via ORAL
  Filled 2023-01-26: qty 1

## 2023-01-26 NOTE — Progress Notes (Signed)
RN spoke with Karen [name] at [facility] on report

## 2023-01-26 NOTE — TOC Transition Note (Signed)
Transition of Care [facility]) - Discharge Note   Patient Details  Name: Karen [name] MRN: [id number] Date of Birth: 05/11/1928  Transition of Care [facility]) CM/SW Contact:  Delilah Shan, LCSWA Phone Number: 01/26/2023, 3:44 PM   Clinical Narrative:     Patient will DC to: [facility]   Anticipated DC date: 01/26/2023  Family notified: Viviann Spare  Transport by: Sharin Mons  ?  Per MD patient ready for DC to [facility] . RN, patient, patient's family, and Cheri with [facility],notified of DC. Discharge Summary sent to facility. RN given number for report [phone]. DC packet on chart. DNR signed by MD attached to patients DC packet.Ambulance transport requested for patient.  CSW signing off.   Final next [facility] of care: [facility] [place]) Barriers to Discharge: No Barriers Identified   Patient Goals and CMS Choice Patient states their goals for this hospitalization and ongoing recovery are:: Patient plans to return home with home health CMS Medicare.gov Compare Post Acute Care list provided to:: Patient Choice offered to / list presented to : Adult Children      Discharge Placement              Patient chooses bed at:  [facility] [place]) Patient to be transferred to facility by: [facility] Name of family member notified: Viviann Spare Patient and family notified of of transfer: 01/26/23  Discharge Plan and Services Additional resources added to the After Visit Summary for   In-house Referral: NA Discharge Planning Services: CM Consult Post Acute Care Choice: Hospice                    HH Arranged: RN [facility] Agency: [facility] Date [facility] Agency Contacted: 01/25/23 Time HH Agency Contacted: 1547 Representative spoke with at [facility] Agency: Cheri  Social Drivers of Health (SDOH) Interventions SDOH Screenings   Food Insecurity: No Food Insecurity (01/21/2023)  Housing: Low Risk  (01/21/2023)   Transportation Needs: No Transportation Needs (01/21/2023)  Utilities: Not At Risk (01/21/2023)  Depression (PHQ2-9): Low Risk  (11/30/2021)  Tobacco Use: Low Risk  (01/20/2023)     Readmission Risk Interventions    01/24/2023    3:09 PM  Readmission Risk Prevention Plan  Transportation Screening Complete  PCP or Specialist Appt within 3-5 Days Complete  HRI or Home Care Consult Complete  Social Work Consult for Recovery Care Planning/Counseling Complete  Palliative Care Screening Not Applicable  Medication Review Oceanographer) Referral to Pharmacy

## 2023-01-26 NOTE — Progress Notes (Signed)
PROGRESS NOTE    Karen [name]  RJJ:[id number] DOB: [birth year] DOA: 01/20/2023 PCP: Irena Reichmann, DO    Brief Narrative:  89 year old female with history of chronic diastolic heart failure, type 2 diabetes, hypertension, CKD stage IV with baseline creatinine was 1.8 who is admitted to the hospital with chest pain and shortness of breath.  She was found to have regional wall motion abnormality, worsening ejection fraction and non-STEMI.  Not an ideal candidate for cardiac cath.   Remained on medical management. Patient continued to have episodes of shortness of breath and chest pain consistent with ongoing acute coronary syndrome.  Episode of bradycardia. With overall poor outcome and ongoing disease burden palliative care was consulted. 12/18, comfort care.  Symptom management and home with home hospice when able to manage her symptoms.  Subjective:  Patient seen and examined.  Did not have any more episodes of indigestion but she gets random episodes of dyspnea that are very unpleasant and takes about 1 to 2 minutes to go away.  She is appropriately anxious with all this. started on fentanyl patch.  Will start patient on oral oxycodone liquid.  She is allergic to morphine and codeine. Patient is aware about plan for home hospice, however wants to feel more comfortable to go home.  If she continues to have symptoms, may need infusions and transfer to inpatient hospice.   Assessment & Plan:   Non-ST elevation MI: Ongoing chest pain and dyspnea.  See goal of care discussion below. Echocardiogram consistent with ejection fraction 35 to 40%, left ventricle with regional wall motion abnormality, akinetic and aneurysmal apex.  Troponins elevated.  Patient unable to have cardiac cath with CKD and previous severe reaction to contrast.  Recommended medical management.   Will continue aspirin and nitrates.  Metoprolol discontinued due to bradycardia.  Will discontinue her statin, no long-term  benefits.  AKI on CKD stage IV: Creatinine at about baseline.  Will not check further.  Type 2 diabetes: On insulin.  Fairly stable today.  Will allow regular diet.  Constipation : Better with stool softener and MiraLAX.  Indigestion: High dose of Protonix and Pepcid along with a scheduled calcium carbonate.  Goal of care: Palliative care meeting 12/17.  Referred to home hospice program. Patient continues to have unpleasant symptoms, will need further medication adjustment before discharging home. DNR comfort care. All comfort care medications are available. Will continue reasonable medications to control blood sugars, achieve symptom control.  Pain controlled. Not ready to go home with episodic unpleasant sensations. Can discontinue cardiac monitor. RN to pronounce death if happens in the hospital.   DVT prophylaxis: SCDs Start: 01/23/23 1416 Place and maintain sequential compression device Start: 01/23/23 1416   Code Status: Full code. Family Communication: None at the bedside Disposition Plan: Status is: Inpatient Remains inpatient appropriate because: Symptom management before transitioning home.     Consultants:  Cardiology Palliative care [facility]  Procedures:  None  Antimicrobials:  None     Objective: Vitals:   01/25/23 1614 01/25/23 2014 01/26/23 0534 01/26/23 0809  BP: 125/74 130/69 120/84 139/78  Pulse: 84 88 90 93  Resp: 20  18 17   Temp: 98 F (36.7 C) 97.9 F (36.6 C) (!) 97.5 F (36.4 C) 97.9 F (36.6 C)  TempSrc: Oral Oral Oral Oral  SpO2: 99% 100% 99% 100%  Weight:   63.8 kg   Height:        Intake/Output Summary (Last 24 hours) at 01/26/2023 1049 Last data  filed at 01/26/2023 0810 Gross per 24 hour  Intake 840 ml  Output 200 ml  Net 640 ml   Filed Weights   01/24/23 0445 01/25/23 0539 01/26/23 0534  Weight: 63.7 kg 64.4 kg 63.8 kg    Examination:  General exam: Currently comfortable.  Anxious discussing about her  symptoms.Marland Kitchen Respiratory system: Clear to auscultation.  Poor air entry at bases. Cardiovascular system: S1 & S2 heard, RRR.  No edema. Gastrointestinal system: Soft nontender.  Bowel sound present.     Data Reviewed: I have personally reviewed following labs and imaging studies  CBC: Recent Labs  Lab 01/20/23 1400 01/21/23 0442 01/23/23 0518 01/23/23 1447 01/24/23 0436 01/25/23 0350 01/26/23 0357  WBC 8.1   < > 5.3 5.3 5.3 5.8 6.5  NEUTROABS 6.6  --   --   --   --   --   --   HGB 10.4*   < > 8.2* 8.9* 8.5* 8.5* 9.1*  HCT 32.1*   < > 24.4* 26.1* 24.8* 25.5* 26.8*  MCV 109.6*   < > 103.8* 104.0* 103.8* 104.5* 104.7*  PLT 115*   < > 107* 118* 108* 104* 111*   < > = values in this interval not displayed.   Basic Metabolic Panel: Recent Labs  Lab 01/20/23 2333 01/21/23 0442 01/22/23 0932 01/23/23 0518 01/23/23 1447 01/24/23 0436 01/25/23 0350 01/26/23 0357  NA 136 134* 134* 134*  --  133* 130* 129*  K 4.6 4.0 3.4* 3.6  --  4.5 4.2 5.1  CL 102 102 99 101  --  103 100 96*  CO2 19* 20* 22 20*  --  22 18* 20*  GLUCOSE 231* 242* 261* 176*  --  233* 167* 238*  BUN 69* 68* 68* 66*  --  60* 58* 59*  CREATININE 2.59* 3.02* 3.43* 3.26* 3.42* 2.96* 2.99* 2.86*  CALCIUM 9.7 9.4 9.3 8.7*  --  9.1 9.0 9.4  MG 2.3 2.2  --   --   --   --   --   --    GFR: Estimated Creatinine Clearance: 10.8 mL/min (A) (by C-G formula based on SCr of 2.86 mg/dL (H)). Liver Function Tests: Recent Labs  Lab 01/21/23 0442  AST 20  ALT 16  ALKPHOS 56  BILITOT 0.5  PROT 6.5  ALBUMIN 3.2*   No results for input(s): "LIPASE", "AMYLASE" in the last 168 hours. No results for input(s): "AMMONIA" in the last 168 hours. Coagulation Profile: No results for input(s): "INR", "PROTIME" in the last 168 hours. Cardiac Enzymes: No results for input(s): "CKTOTAL", "CKMB", "CKMBINDEX", "TROPONINI" in the last 168 hours. BNP (last 3 results) No results for input(s): "PROBNP" in the last 8760  hours. HbA1C: No results for input(s): "HGBA1C" in the last 72 hours. CBG: Recent Labs  Lab 01/25/23 0716 01/25/23 1138 01/25/23 1607 01/25/23 2016 01/26/23 0747  GLUCAP 234* 310* 267* 210* 308*   Lipid Profile: No results for input(s): "CHOL", "HDL", "LDLCALC", "TRIG", "CHOLHDL", "LDLDIRECT" in the last 72 hours.  Thyroid Function Tests: No results for input(s): "TSH", "T4TOTAL", "FREET4", "T3FREE", "THYROIDAB" in the last 72 hours. Anemia Panel: No results for input(s): "VITAMINB12", "FOLATE", "FERRITIN", "TIBC", "IRON", "RETICCTPCT" in the last 72 hours.  Sepsis Labs: No results for input(s): "PROCALCITON", "LATICACIDVEN" in the last 168 hours.  No results found for this or any previous visit (from the past 240 hours).       Radiology Studies: No results found.      Scheduled Meds:  aspirin  81 mg Oral Daily   calcium carbonate  1 tablet Oral TID WC   docusate sodium  100 mg Oral BID   feeding supplement (GLUCERNA SHAKE)  237 mL Oral TID BM   fentaNYL  1 patch Transdermal Q72H   insulin aspart  0-5 Units Subcutaneous QHS   insulin aspart  0-9 Units Subcutaneous TID WC   insulin aspart  2 Units Subcutaneous TID WC   insulin glargine-yfgn  6 Units Subcutaneous QHS   isosorbide mononitrate  60 mg Oral Daily   mirtazapine  15 mg Oral QHS   pantoprazole  40 mg Oral BID   polyethylene glycol  17 g Oral BID   QUEtiapine  200 mg Oral QHS   senna-docusate  2 tablet Oral QHS   Continuous Infusions:     LOS: 6 days    Time spent: 35 minutes     Dorcas Carrow, MD [facility]

## 2023-01-26 NOTE — Progress Notes (Addendum)
Palliative care notes reviewed with plan for home hospice. Discussed with Dr. Elease Hashimoto -  he would recommend to continue to hold her beta blocker given her episodes of bradycardia. He feels this is likely ischemic and beta blockers are not necessarily going to prevent her episodes of angina/ischemia, so OK to remain off. Otherwise no new recommendations. Please do not hesitate to reach out if we can be of assistance.  [facility] will sign off.   Medication Recommendations:  optimize symptom management per palliative team Other recommendations (labs, testing, etc):  none Follow up as an outpatient:  as needed, happy to arrange follow-up if patient/family desire, can notify our team   I agree with the above note by Ronie Spies, PA      Kristeen Miss, MD  01/26/2023 10:31 AM    [facility] [address]  [address] [place], [place]  [address] Phone: [phone]; Fax: [phone]

## 2023-01-26 NOTE — TOC Transition Note (Signed)
Transition of Care [facility]) - Discharge Note   Patient Details  Name: Karen [name] MRN: [id number] Date of Birth: September 14, 1928  Transition of Care [facility]) CM/SW Contact:  Gala Lewandowsky, RN Phone Number: 01/26/2023, 2:01 PM   Clinical Narrative: Plan is for Residential Facility for Hospice at [facility] the [place]. Liaison is speaking with the family regarding disposition. Patient will need ambulance transport if she is stable to transport home today. No further needs identified at this time.       Final next level of care: Hospice Medical Facility Barriers to Discharge: No Barriers Identified   Patient Goals and CMS Choice Patient states their goals for this hospitalization and ongoing recovery are:: Patient plans to return home with home health CMS Medicare.gov Compare Post Acute Care list provided to:: Patient Choice offered to / list presented to : NA, Patient   Discharge Plan and Services Additional resources added to the After Visit Summary for   In-house Referral: NA Discharge Planning Services: CM Consult Post Acute Care Choice: Hospice             HH Arranged: RN [facility] Agency: [facility] [place] Date HH Agency Contacted: 01/25/23 Time HH Agency Contacted: 1547 Representative spoke with at [facility] Agency: Cheri  Social Drivers of Health (SDOH) Interventions SDOH Screenings   Food Insecurity: No Food Insecurity (01/21/2023)  Housing: Low Risk  (01/21/2023)  Transportation Needs: No Transportation Needs (01/21/2023)  Utilities: Not At Risk (01/21/2023)  Depression (PHQ2-9): Low Risk  (11/30/2021)  Tobacco Use: Low Risk  (01/20/2023)   Readmission Risk Interventions    01/24/2023    3:09 PM  Readmission Risk Prevention Plan  Transportation Screening Complete  PCP or Specialist Appt within 3-5 Days Complete  HRI or Home Care Consult Complete  Social Work Consult for Recovery Care Planning/Counseling Complete  Palliative Care Screening Not  Applicable  Medication Review [employer]) Referral to Pharmacy

## 2023-01-26 NOTE — Discharge Summary (Addendum)
Physician Discharge Summary  Karen [name] UEA:[id number] DOB: 01/17/1929 DOA: 01/20/2023  PCP: Irena Reichmann, DO  Admit date: 01/20/2023 Discharge date: 01/26/2023  Admitted From: Home Disposition: Inpatient hospice   Discharge Condition: Serious CODE STATUS: DNR with comfort care Diet recommendation: Regular diet as tolerated  Discharge summary: 89 year old female with history of chronic diastolic heart failure, type 2 diabetes, hypertension, CKD stage IV with baseline creatinine was 1.8 who was admitted to the hospital with chest pain and shortness of breath.  She was found to have regional wall motion abnormality, worsening ejection fraction and non-STEMI.  Not an ideal candidate for cardiac cath.  Remained on medical management. Patient continued to have episodes of shortness of breath and chest pain consistent with ongoing acute coronary syndrome.  Episode of bradycardia. With overall poor outcome and ongoing disease burden palliative care was consulted. Patient started having intolerable symptoms, episodic shortness of breath, bradycardia episodes.  She was served with comfort care and hospice philosophy. Due to patient's high disease burden and need for parenteral symptom control medications, she will be discharged to inpatient hospice for end-of-life care.  Non-STEMI CKD stage IV Type 2 diabetes Persistent nausea vomiting, chest pain.  Continue all symptom control medications.  She has better tolerated fentanyl patch along with fentanyl injections for pain and shortness of breath. She can continue to take acid suppressing medications.  Will discontinue insulin, will discontinue diabetes treatment.  Allow regular diet. Stable to transfer to inpatient hospice to provide end-of-life. She will be medicated for comfort at the time of transfer.     Discharge Diagnoses:  Principal Problem:   NSTEMI (non-ST elevated myocardial infarction) (HCC) Active Problems:   Acute  renal failure superimposed on stage 4 chronic kidney disease (HCC)   Type 2 diabetes mellitus with hyperlipidemia (HCC)   Essential hypertension   Hypokalemia   Macrocytic anemia   Acute on chronic diastolic heart failure (HCC)   Hyperkalemia   Hyponatremia   Chronic systolic CHF (congestive heart failure) (HCC)   GERD (gastroesophageal reflux disease)    Discharge Instructions  Discharge Instructions     Diet general   Complete by: As directed    Increase activity slowly   Complete by: As directed       Allergies as of 01/26/2023       Reactions   Lisinopril Cough   Ace inhibitor   Pneumococcal Vaccines Swelling, Other (See Comments)   Very bad pain and swelling in the arm of the shot, needed 2 cortisone shots.   Antihistamines, Diphenhydramine-type Other (See Comments)   Carvedilol Other (See Comments)   Weakness, dizziness, upset stomach, trembling    Codeine Nausea And Vomiting   Morphine And Codeine Nausea And Vomiting   Ultram [tramadol Hcl] Itching        Medication List     STOP taking these medications    amLODipine 5 MG tablet Commonly known as: NORVASC   atorvastatin 40 MG tablet Commonly known as: LIPITOR   Calcium Carbonate-Vitamin D 600-400 MG-UNIT tablet   Co Q 10 100 MG Caps   furosemide 20 MG tablet Commonly known as: LASIX   glipiZIDE 10 MG 24 hr tablet Commonly known as: GLUCOTROL XL   Magnesium 250 MG Tabs   metFORMIN 1000 MG tablet Commonly known as: GLUCOPHAGE   metoprolol succinate 100 MG 24 hr tablet Commonly known as: TOPROL-XL   Potassium Chloride ER 20 MEQ Tbcr   potassium chloride SA 20 MEQ tablet Commonly known as: Jerene Dilling  REFRESH OP   valsartan 320 MG tablet Commonly known as: DIOVAN       TAKE these medications    acetaminophen 650 MG CR tablet Commonly known as: TYLENOL Take 650 mg by mouth at bedtime as needed (arthritis pain).   aspirin 81 MG chewable tablet Chew 1 tablet (81 mg total) by  mouth daily.   calcium carbonate 500 MG chewable tablet Commonly known as: TUMS - dosed in mg elemental calcium Chew 1 tablet (200 mg of elemental calcium total) by mouth 3 (three) times daily with meals.   famotidine 20 MG tablet Commonly known as: PEPCID Take 1 tablet (20 mg total) by mouth 2 (two) times daily as needed for heartburn.   fentaNYL 12 MCG/HR Commonly known as: DURAGESIC Place 1 patch onto the skin every 3 (three) days. Start taking on: January 28, 2023   mirtazapine 15 MG tablet Commonly known as: REMERON Take 15 mg by mouth at bedtime.   multivitamin with minerals Tabs tablet Take 1 tablet by mouth every morning.   pantoprazole 40 MG tablet Commonly known as: PROTONIX Take 1 tablet (40 mg total) by mouth 2 (two) times daily.   PreserVision AREDS 2 Caps Take 1 capsule by mouth 2 (two) times daily.   QUEtiapine 200 MG tablet Commonly known as: SEROQUEL Take 200 mg by mouth at bedtime.        Follow-up Information     Corrin Parker, PA-C Follow up.   Specialty: Cardiology Why: [facility] - [facility] location Cardiology follow-up scheduled Tuesday Feb 15, 2023 at 8:50 AM Arrive 15 minutes prior to appointment to check in Contact information: [address] [place] [address] [place] [address] [phone]         [place] [place] [facility] Follow up.   Why: Registered Nurse-office to call with visit times. Contact information: [address] [place] [address] [phone]                Allergies  Allergen Reactions   Lisinopril Cough    Ace inhibitor   Pneumococcal Vaccines Swelling and Other (See Comments)    Very bad pain and swelling in the arm of the shot, needed 2 cortisone shots.   Antihistamines, Diphenhydramine-Type Other (See Comments)   Carvedilol Other (See Comments)    Weakness, dizziness, upset stomach, trembling    Codeine Nausea And Vomiting   Morphine And Codeine Nausea And Vomiting   Ultram  [Tramadol Hcl] Itching    Consultations: Cardiology Palliative Hospice   Procedures/Studies: ECHOCARDIOGRAM COMPLETE Result Date: 01/21/2023    ECHOCARDIOGRAM REPORT   Patient Name:   Karen [name] Date of Exam: 01/21/2023 Medical Rec #:  [id number]         Height:       64.0 in Accession #:    [id number]        Weight:       138.5 lb Date of Birth:  09-Jul-1928         BSA:          1.674 m Patient Age:    89 years          BP:           102/58 mmHg Patient Gender: F                 HR:           77 bpm. Exam Location:  Inpatient Procedure: 2D Echo, Color Doppler, Cardiac Doppler and Intracardiac  Opacification Agent Indications:    chf  History:        Patient has prior history of Echocardiogram examinations, most                 recent 04/27/2021. Cardiomyopathy and CHF, CAD,                 Signs/Symptoms:Dyspnea; Risk Factors:Diabetes and Hypertension.  Sonographer:    Webb Laws Referring Phys: [id number] ANGELA NICOLE DUKE IMPRESSIONS  1. Left ventricular ejection fraction, by estimation, is 35 to 40%. The left ventricle has moderately decreased function. The left ventricle demonstrates regional wall motion abnormalities - akinetic, aneurysmal apex and septum with hyperdynamic lateral  wall. There is moderate asymmetric left ventricular hypertrophy of the basal-septal segment and aorta angulation. Left ventricular diastolic parameters are indeterminate.  2. Right ventricular systolic function is normal. The right ventricular size is normal. There is normal pulmonary artery systolic pressure. The estimated right ventricular systolic pressure is 33.0 mmHg.  3. Left atrial size was mildly dilated.  4. The mitral valve is degenerative. Mild mitral valve regurgitation. Mild mitral stenosis. The mean mitral valve gradient is 4.4 mmHg with average heart rate of 80 bpm. Severe mitral annular calcification.  5. The aortic valve is abnormal. There is moderate calcification of the aortic valve.  Aortic valve regurgitation is trivial. Aortic valve sclerosis/calcification is present, without any evidence of aortic stenosis.  6. The inferior vena cava is normal in size with greater than 50% respiratory variability, suggesting right atrial pressure of 3 mmHg.  7. Aortic dilatation noted. Aneurysm of the ascending aorta, measuring 49 mm. There is moderate-severe dilatation of the ascending aorta. FINDINGS  Left Ventricle: Left ventricular ejection fraction, by estimation, is 35 to 40%. The left ventricle has moderately decreased function. The left ventricle demonstrates regional wall motion abnormalities. The left ventricular internal cavity size was normal in size. There is moderate asymmetric left ventricular hypertrophy of the basal-septal segment. Left ventricular diastolic parameters are indeterminate.  LV Wall Scoring: The mid and distal anterior septum, apical lateral segment, mid inferoseptal segment, apical anterior segment, and apical inferior segment are aneurysmal. The basal anteroseptal segment and basal inferoseptal segment are hypokinetic. Right Ventricle: The right ventricular size is normal. No increase in right ventricular wall thickness. Right ventricular systolic function is normal. There is normal pulmonary artery systolic pressure. The tricuspid regurgitant velocity is 2.74 m/s, and  with an assumed right atrial pressure of 3 mmHg, the estimated right ventricular systolic pressure is 33.0 mmHg. Left Atrium: Left atrial size was mildly dilated. Right Atrium: Right atrial size was normal in size. Pericardium: There is no evidence of pericardial effusion. Mitral Valve: The mitral valve is degenerative in appearance. Severe mitral annular calcification. Mild mitral valve regurgitation. Mild mitral valve stenosis. The mean mitral valve gradient is 4.4 mmHg with average heart rate of 80 bpm. Tricuspid Valve: The tricuspid valve is normal in structure. Tricuspid valve regurgitation is mild . No  evidence of tricuspid stenosis. Aortic Valve: The aortic valve is abnormal. There is moderate calcification of the aortic valve. Aortic valve regurgitation is trivial. Aortic regurgitation PHT measures 361 msec. Aortic valve sclerosis/calcification is present, without any evidence of aortic stenosis. Aortic valve mean gradient measures 5.2 mmHg. Aortic valve peak gradient measures 11.4 mmHg. Aortic valve area, by VTI measures 1.79 cm. Pulmonic Valve: The pulmonic valve was normal in structure. Pulmonic valve regurgitation is not visualized. No evidence of pulmonic stenosis. Aorta: Aortic dilatation noted. There is moderate-severe  dilatation of the ascending aorta. There is an aneurysm involving the ascending aorta measuring 49 mm. Venous: The inferior vena cava is normal in size with greater than 50% respiratory variability, suggesting right atrial pressure of 3 mmHg. IAS/Shunts: The atrial septum is grossly normal.  LEFT VENTRICLE PLAX 2D LVIDd:         5.40 cm     Diastology LVIDs:         3.80 cm     LV e' medial:    3.15 cm/s LV PW:         1.00 cm     LV E/e' medial:  36.2 LV IVS:        1.20 cm     LV e' lateral:   3.48 cm/s LVOT diam:     2.00 cm     LV E/e' lateral: 32.8 LV SV:         56 LV SV Index:   33 LVOT Area:     3.14 cm  LV Volumes (MOD) LV vol d, MOD A2C: 81.8 ml LV vol d, MOD A4C: 77.6 ml LV vol s, MOD A2C: 46.2 ml LV vol s, MOD A4C: 49.2 ml LV SV MOD A2C:     35.6 ml LV SV MOD A4C:     77.6 ml LV SV MOD BP:      32.7 ml RIGHT VENTRICLE             IVC RV Basal diam:  2.50 cm     IVC diam: 1.50 cm RV S prime:     18.80 cm/s TAPSE (M-mode): 3.0 cm LEFT ATRIUM           Index        RIGHT ATRIUM          Index LA diam:      3.70 cm 2.21 cm/m   RA Area:     9.24 cm LA Vol (A2C): 20.0 ml 11.95 ml/m  RA Volume:   15.50 ml 9.26 ml/m LA Vol (A4C): 45.8 ml 27.37 ml/m  AORTIC VALVE AV Area (Vmax):    1.90 cm AV Area (Vmean):   1.98 cm AV Area (VTI):     1.79 cm AV Vmax:           168.46 cm/s AV  Vmean:          105.513 cm/s AV VTI:            0.312 m AV Peak Grad:      11.4 mmHg AV Mean Grad:      5.2 mmHg LVOT Vmax:         102.00 cm/s LVOT Vmean:        66.600 cm/s LVOT VTI:          0.178 m LVOT/AV VTI ratio: 0.57 AI PHT:            361 msec  AORTA Ao Root diam: 3.20 cm Ao Asc diam:  4.85 cm MITRAL VALVE                TRICUSPID VALVE MV Area (PHT): 5.66 cm     TR Peak grad:   30.0 mmHg MV Mean grad:  4.4 mmHg     TR Vmax:        274.00 cm/s MV Decel Time: 134 msec MR PISA:        1.57 cm    SHUNTS MR PISA Radius: 0.50 cm     Systemic VTI:  0.18 m MV E velocity: 114.00 cm/s  Systemic Diam: 2.00 cm MV A velocity: 140.00 cm/s MV E/A ratio:  0.81 Weston Brass MD Electronically signed by Weston Brass MD Signature Date/Time: 01/21/2023/6:52:00 PM    Final    US RENAL Result Date: 01/21/2023 CLINICAL DATA:  [id number] AKI (acute kidney injury) (HCC) [id number] EXAM: RENAL / URINARY TRACT ULTRASOUND COMPLETE COMPARISON:  None Available. FINDINGS: Right Kidney: Renal measurements: 8.9 x 3.9 x 4.7 cm = volume: 84 mL. Increased renal cortical echogenicity. 1.3 cm simple cyst which requires no follow-up imaging. No solid mass, shadowing stone, or hydronephrosis visualized. Left Kidney: Renal measurements: 7.5 x 4.3 x 4.2 cm = volume: 72 mL. Increased renal cortical echogenicity. Several simple appearing left renal cysts, which require no follow-up imaging. No solid mass, shadowing stone, or hydronephrosis visualized. Bladder: Appears normal for degree of bladder distention. Other: None. IMPRESSION: 1. No hydronephrosis. 2. Increased renal cortical echogenicity bilaterally, suggesting chronic medical renal disease. Electronically Signed   By: Duanne Guess D.O.   On: 01/21/2023 13:26   DG Chest 2 View Result Date: 01/20/2023 CLINICAL DATA:  Shortness of breath EXAM: CHEST - 2 VIEW COMPARISON:  04/27/2021 FINDINGS: Heart size within normal limits. Known thoracic aortic aneurysm with extensive  atherosclerotic calcification. Stable mediastinal contours. No focal airspace consolidation, pleural effusion, or pneumothorax. Multilevel degenerative spondylosis of the thoracic spine. Degenerative changes of the left shoulder. IMPRESSION: 1. No active cardiopulmonary disease. 2. Known thoracic aortic aneurysm. Electronically Signed   By: Duanne Guess D.O.   On: 01/20/2023 15:17   (Echo, Carotid, EGD, Colonoscopy, ERCP)    Subjective: Patient seen and examined in the morning rounds. Examined later in the afternoon for discharge readiness.  She continues to have nausea.  Chest pain responded to fentanyl.  Her son at the bedside.  A[facility]able [facility].   Discharge Exam: Vitals:   01/26/23 0534 01/26/23 0809  BP: 120/84 139/78  Pulse: 90 93  Resp: 18 17  Temp: (!) 97.5 F (36.4 C) 97.9 F (36.6 C)  SpO2: 99% 100%   Vitals:   01/25/23 1614 01/25/23 2014 01/26/23 0534 01/26/23 0809  BP: 125/74 130/69 120/84 139/78  Pulse: 84 88 90 93  Resp: 20  18 17   Temp: 98 F (36.7 C) 97.9 F (36.6 C) (!) 97.5 F (36.4 C) 97.9 F (36.6 C)  TempSrc: Oral Oral Oral Oral  SpO2: 99% 100% 99% 100%  Weight:   63.8 kg   Height:        General: Pt is alert, awake, in moderate distress.  Anxious.  Persistent nausea. Cardiovascular: RRR, S1/S2 +, no rubs, no gallops Respiratory: CTA bilaterally, no wheezing, no rhonchi Abdominal: Soft, NT, ND, bowel sounds + Extremities: no edema, no cyanosis    The results of significant diagnostics from this hospitalization (including imaging, microbiology, ancillary and laboratory) are listed below for reference.     Microbiology: No results found for this or any previous visit (from the past 240 hours).   Labs: BNP (last 3 results) Recent Labs    01/20/23 1400  BNP 1,734.4*   Basic Metabolic Panel: Recent Labs  Lab 01/20/23 2333 01/21/23 0442 01/22/23 0932 01/23/23 0518 01/23/23 1447 01/24/23 0436 01/25/23 0350  01/26/23 0357  NA 136 134* 134* 134*  --  133* 130* 129*  K 4.6 4.0 3.4* 3.6  --  4.5 4.2 5.1  CL 102 102 99 101  --  103 100 96*  CO2 19* 20*  22 20*  --  22 18* 20*  GLUCOSE 231* 242* 261* 176*  --  233* 167* 238*  BUN 69* 68* 68* 66*  --  60* 58* 59*  CREATININE 2.59* 3.02* 3.43* 3.26* 3.42* 2.96* 2.99* 2.86*  CALCIUM 9.7 9.4 9.3 8.7*  --  9.1 9.0 9.4  MG 2.3 2.2  --   --   --   --   --   --    Liver Function Tests: Recent Labs  Lab 01/21/23 0442  AST 20  ALT 16  ALKPHOS 56  BILITOT 0.5  PROT 6.5  ALBUMIN 3.2*   No results for input(s): "LIPASE", "AMYLASE" in the last 168 hours. No results for input(s): "AMMONIA" in the last 168 hours. CBC: Recent Labs  Lab 01/20/23 1400 01/21/23 0442 01/23/23 0518 01/23/23 1447 01/24/23 0436 01/25/23 0350 01/26/23 0357  WBC 8.1   < > 5.3 5.3 5.3 5.8 6.5  NEUTROABS 6.6  --   --   --   --   --   --   HGB 10.4*   < > 8.2* 8.9* 8.5* 8.5* 9.1*  HCT 32.1*   < > 24.4* 26.1* 24.8* 25.5* 26.8*  MCV 109.6*   < > 103.8* 104.0* 103.8* 104.5* 104.7*  PLT 115*   < > 107* 118* 108* 104* 111*   < > = values in this interval not displayed.   Cardiac Enzymes: No results for input(s): "CKTOTAL", "CKMB", "CKMBINDEX", "TROPONINI" in the last 168 hours. BNP: Invalid input(s): "POCBNP" CBG: Recent Labs  Lab 01/25/23 1138 01/25/23 1607 01/25/23 2016 01/26/23 0747 01/26/23 1213  GLUCAP 310* 267* 210* 308* 271*   D-Dimer No results for input(s): "DDIMER" in the last 72 hours. Hgb A1c No results for input(s): "HGBA1C" in the last 72 hours. Lipid Profile No results for input(s): "CHOL", "HDL", "LDLCALC", "TRIG", "CHOLHDL", "LDLDIRECT" in the last 72 hours. Thyroid function studies No results for input(s): "TSH", "T4TOTAL", "T3FREE", "THYROIDAB" in the last 72 hours.  Invalid input(s): "FREET3" Anemia work up No results for input(s): "VITAMINB12", "FOLATE", "FERRITIN", "TIBC", "IRON", "RETICCTPCT" in the last 72 hours. Urinalysis     Component Value Date/Time   COLORURINE STRAW (A) 01/21/2023 0108   APPEARANCEUR CLEAR 01/21/2023 0108   LABSPEC 1.006 01/21/2023 0108   PHURINE 5.0 01/21/2023 0108   GLUCOSEU NEGATIVE 01/21/2023 0108   HGBUR NEGATIVE 01/21/2023 0108   BILIRUBINUR NEGATIVE 01/21/2023 0108   KETONESUR NEGATIVE 01/21/2023 0108   PROTEINUR NEGATIVE 01/21/2023 0108   UROBILINOGEN 0.2 05/14/2012 0933   NITRITE NEGATIVE 01/21/2023 0108   LEUKOCYTESUR TRACE (A) 01/21/2023 0108   Sepsis Labs Recent Labs  Lab 01/23/23 1447 01/24/23 0436 01/25/23 0350 01/26/23 0357  WBC 5.3 5.3 5.8 6.5   Microbiology No results found for this or any previous visit (from the past 240 hours).   Time coordinating discharge: 35 minutes  SIGNED:   Dorcas Carrow, MD  [facility] 01/26/2023, 3:13 PM

## 2023-01-28 ENCOUNTER — Telehealth: Payer: Self-pay

## 2023-01-28 NOTE — Transitions of Care (Post Inpatient/ED Visit) (Signed)
   01/28/2023  Name: Karen [name] MRN: [id number] DOB: [birth year]  Today's TOC FU Call Status: Today's TOC FU Call Status:: Unsuccessful Call (1st Attempt) Unsuccessful Call (1st Attempt) Date: 01/28/23  Attempted to reach the patient regarding the most recent Inpatient/ED visit.  Follow Up Plan: Additional outreach attempts will be made to reach the patient to complete the Transitions of Care (Post Inpatient/ED visit) call.   Bruna Dills Daphine Deutscher BSN, RN RN Care Manager   Transitions of Care [facility] - [facility] Direct Dial Number:  [phone]

## 2023-02-06 NOTE — Progress Notes (Deleted)
 Cardiology Office Note:    Date:  02/06/2023   ID:  [name], DOB 1929-01-14, MRN [id number]  PCP:  Irena Reichmann, DO  Cardiologist:  Thurmon Fair, MD { Click to update primary MD,subspecialty MD or APP then REFRESH:1}    Referring MD: Irena Reichmann, DO   Chief Complaint: hospital follow-up of NSTEMI  History of Present Illness:    Karen [name] is a 89 y.o. female with a history of CAD s/p DES to LAD and LCX in 04/2013 and recent NSTEMI in 01/2023 (treated medically), chronic diastolic CHF, ascending aortic aneurysm, hypertension, type 2 diabetes, CKD stage IV,  fibromyalgia, and arthritis who is followed by Dr. Royann Shivers and presents today for hospital follow-up of recent NSTEMI.   Patient has a history of CAD with LHC in 04/2013 showing 30-40% stenosis of distal left main, 40% stenosis of proximal LAD followed by 90% stenosis of mid to distal LAD, 70-90% stenosis of mid to distal LCx, 40-50% of mid RCA, and 20-80% stenosis of PDA. She underwent successful PCI with DES to LAD and LCx lesions. Echo prior to cath in 03/2013 showed mildly reduced LVEF of 45-50% with sepal and apical hypokinesis and LV gram on cath showed LVEF of 50%. Repeat Echo in 04/2020 showed improvement of LVEF to 60-65% with normal wall motion and severe LVH of the basal-septal segment as well as moderate mitral stenosis and mild AI. Routine CTA  in 07/2020 for routine monitoring of thoracic aortic aneurysm which showed a stable 4.8 cm ascending thoracic aortic aneurysm and a stable 4.3 cm aneurysm dilation of the distal descending thoracic aorta.  She was admitted in 04/2021 for a left hip dislocation following a mechanical fall where she slipped in the shower.  She was seen by Ortho and ultimately underwent a left total hip revision on 04/09/2021.  She initially did well postoperatively and was discharged on aspirin for DVT prophylaxis but developed a hematoma with this requiring drainage on 04/21/2021.  She was  readmitted later that month after presenting with shortness of breath and inability to lay flat.  There was concern for PE given recent lower extremity surgery but CTA was not done due to underlying renal function.  However, VQ scan showed no PE.  Patient ultimately reported a 77-month history of dyspnea on exertion but denied any chest pain.  High-sensitivity troponin was mildly elevated but flat peaking at 192.  BNP was mildly elevated at 162.  Chest x-ray showed no signs of edema.  Echo showed LVEF of 50-55% with distal septal hypokinesis, mild asymmetric LVH of the basal and septal segments and indeterminate diastolic parameters. Of note, no mitral stenosis was noted on this Echo.  She was given a dose of IV Lasix with some improvement in her breathing.  Given new dyspnea on exertion and new wall motion abnormality in distribution of prior stent, there was concern for ischemia.  Cardiac catheterization was discussed but decision was made to defer this.  She was discharged on DAPT with aspirin and Plavix for medical management of NSTEMI. A cardiac PET was ordered as an outpatient for further evaluation of her dyspnea given problems with recurrent hematoma from left total hip revision. This was performed in 06/2021 and was abnormal. It showed no evidence of ischemia but myocardial blood flow reserve was abnormal at 1.22. This was felt to be influenced by high rest flow due to elevated BP. Regardless, stress myocardial blood flow was abnormal at 1.8 ml/min/g. Overall findings were concerning  for left main or 3 vessel CAD due to drop in EF and TID despite normal perfusion imaging.   She was seen by Dr. Royann Shivers in 06/2021 and cardiac PET results were discussed. Cardiac catheterization was recommended before shoulder surgery but patient declined an was planning to wait on shoulder surgery. She was last seen by Dr. Royann Shivers in 09/2021 at which time she was doing much better. Her dyspnea had essentially resolved and her  right shoulder was doing better. Her left hip seroma had finally stopped re-accumulating as well.  She was last seen by Dr. Royann Shivers in 10/2022 at which time she was doing well with no chest pain or significant dyspnea.   She was recently admitted from 01/20/2023 to 01/26/2023 for NSTEMI after presenting with worsening shortness of breath, fatigue, and chest pain. High-sensitivity troponin peaked at 1,134. Echo showed LVEF of 35-40% with akinetic, aneurysmal apex and moderate asymmetric LVH of the basal-septal segment and aorta angulation as well as mild MR/ MS and moderate to severe dilatation of the ascending aorta measuring 49mm. Hospitalization was also complicated by acute CHF, wide complex bradycardic episodes with rates in the 30s to 50s,  AKI superimposed on CKD, and anemia/ thrombocytopenia. Medical therapy was recommended for her NSTEMI given renal function and advanced age. She was treated with IV Heparin for 48 hours. Creatinine was 2.59 on admission ane peaked at 3.42 during admission. She was seen by Palliative Care during admission and was discharged to in-patient hospice to provide end-of-life care.  Patient presents today for follow-up. ***  CAD Patient has known CAD with prior DES to LAD and LCx in 2015. She was admitted in 04/2021 with NSTEMI after presenting with dyspnea after recent hip surgery. Echo showed LVEF of 50-55% with distal septal hypokinesis.  Cardiac catheterization was discussed but patient and son elected to defer cath at that time and to see how she would do with increased conditioning at home. Outpatient cardiac PET in 06/2021 was abnormal and concerning for  left main or 3 vessel CAD due to drop in EF and TID despite normal perfusion imaging. Dyspnea subsequently improved and it was felt to be unlikely that she would have anatomy amenable to percutaneous revascularization and she would not be a candidate for surgery. However, she was recently admitted in 01/2023 for  NSTEMI. Medical therapy was recommended and patient was discharged to in-patient hospice. - No chest pain.  - Continue Aspirin 81mg  daily. - Previously on Lipitor 40mg  daily but this was stopped during recent admission given she was discharged on hospice.  Chronic HFrEF Echo during recent admission for NSTEMI in 01/2023 showed LVEF of 35-40% with akinetic, aneurysmal apex and moderate asymmetric LVH of the basal-septal segment and aorta angulation as well as mild MR/ MS and moderate to severe dilatation of the ascending aorta measuring 49mm.  - Euvolemic on exam. *** - Not currently on any GDMT. She is currently in in-patient hospice so will not start this.  Ascending Thoracic Aortic Aneurysm Most recent CTA in 07/2020 showed stable 4.8 cm ascending thoracic aortic aneurysm and a stable 4.3 cm aneurysm dilation of the distal descending thoracic aorta. Both areas unchanged from 2021. Cardiac PET in 06/2021 showed similar finding of 4.8 cm thoracic aortic aneurysm and Echo in 01/2023 measured it as 4.9 cm. - She would not be a candidate for surgical repair and is currently in in-patient hospice so no need for routine monitoring of this.   Hypertension BP *** - Antihypertensives were stopped during  recent admission. ***   Hyperlipidemia Lipid panel in 01/2023: Total Cholesterol 108, Triglycerides 95, HDL 39, LDL 50.  - Previously on Lipitor 40mg  daily but this was stopped during recent admission given she was discharged on hospice.   Type 2 Diabetes Mellitus Hemoglobin A1c 8.7 in 01/2023. - Management per PCP.   CKD Stage IV Recent baseline creatinine around 2.5 to 2.9 but peaked at 3.43 during recent admission in 01/2023.   EKGs/Labs/Other Studies Reviewed:    The following studies were reviewed:  Chest CTA 08/05/2020: Impression: 1. Stable 4.8 cm thoracic aortic aneurysm. Ascending thoracic aortic aneurysm. Recommend semi-annual imaging followup by CTA or MRA andreferral to  cardiothoracic surgery if not already obtained. This recommendation follows 2010 ACCF/AHA/AATS/ACR/ASA/SCA/SCAI/SIR/STS/SVM Guidelines for the Diagnosis and Management of Patients With Thoracic Aortic Disease. Circulation. 2010; 121: Z610-R604. 2. Stable 4.3 cm aneurysmal dilation of the distal descending thoracic aorta. 3. Aortic and coronary artery atherosclerotic calcifications. 4. Sequelae of chronic pancreatitis, unchanged compared to prior. _______________   Echocardiogram 04/27/2021: Impression: 1. Distal septal hypokinesis . Left ventricular ejection fraction, by  estimation, is 50 to 55%. The left ventricle has low normal function. The  left ventricle demonstrates regional wall motion abnormalities (see  scoring diagram/findings for  description). There is mild asymmetric left ventricular hypertrophy of the  basal and septal segments. Left ventricular diastolic parameters are  indeterminate.   2. Right ventricular systolic function is normal. The right ventricular  size is normal.   3. Left atrial size was mildly dilated.   4. The mitral valve is degenerative. Trivial mitral valve regurgitation.  No evidence of mitral stenosis. Moderate mitral annular calcification.   5. The aortic valve is tricuspid. There is moderate calcification of the  aortic valve. There is moderate thickening of the aortic valve. Aortic  valve regurgitation is trivial. Aortic valve sclerosis/calcification is  present, without any evidence of  aortic stenosis.   6. The inferior vena cava is normal in size with greater than 50%  respiratory variability, suggesting right atrial pressure of 3 mmHg. _______________   Cardiac PET 06/30/2021:   LV perfusion is normal. There is no evidence of ischemia. There is no evidence of infarction. TID was present (1.39) and confirmed visually. EF dropped with stress (68%->66%). Myocardial blood flow reserve abnormal (1.22) but this was influenced by high rest flow due  to elevated blood pressure (1.50 ml/min/g). Regardless, stress myocardial blood flow abnormal (1.8 ml/min/g). Overall, findings are concerning for left main or 3-vessel CAD due to drop in EF and TID despite normal perfusion imaging. MBF abnormal as well. Would consider invasive angiography for clarification.   Rest left ventricular function is normal. Rest EF: 68 %. Stress left ventricular function is normal. Stress EF: 66 %. End diastolic cavity size is normal.   Myocardial blood flow was computed to be 1.26ml/g/min at rest and 1.62ml/g/min at stress. Global myocardial blood flow reserve was 1.22 and was abnormal.   Coronary calcium assessment not performed due to prior revascularization.   The study is normal. The study is high risk. _______________  Echocardiogram 01/21/2023: Impressions: 1. Left ventricular ejection fraction, by estimation, is 35 to 40%. The  left ventricle has moderately decreased function. The left ventricle  demonstrates regional wall motion abnormalities - akinetic, aneurysmal  apex and septum with hyperdynamic lateral   wall. There is moderate asymmetric left ventricular hypertrophy of the  basal-septal segment and aorta angulation. Left ventricular diastolic  parameters are indeterminate.   2. Right ventricular systolic function  is normal. The right ventricular  size is normal. There is normal pulmonary artery systolic pressure. The  estimated right ventricular systolic pressure is 33.0 mmHg.   3. Left atrial size was mildly dilated.   4. The mitral valve is degenerative. Mild mitral valve regurgitation.  Mild mitral stenosis. The mean mitral valve gradient is 4.4 mmHg with  average heart rate of 80 bpm. Severe mitral annular calcification.   5. The aortic valve is abnormal. There is moderate calcification of the  aortic valve. Aortic valve regurgitation is trivial. Aortic valve  sclerosis/calcification is present, without any evidence of aortic  stenosis.   6.  The inferior vena cava is normal in size with greater than 50%  respiratory variability, suggesting right atrial pressure of 3 mmHg.   7. Aortic dilatation noted. Aneurysm of the ascending aorta, measuring 49  mm. There is moderate-severe dilatation of the ascending aorta.    EKG:  EKG not ordered today.   Recent Labs: 01/20/2023: B Natriuretic Peptide 1,734.4 01/21/2023: ALT 16; Magnesium 2.2 01/26/2023: BUN 59; Creatinine, Ser 2.86; Hemoglobin 9.1; Platelets 111; Potassium 5.1; Sodium 129  Recent Lipid Panel    Component Value Date/Time   CHOL 108 01/23/2023 0518   TRIG 95 01/23/2023 0518   HDL 39 (L) 01/23/2023 0518   CHOLHDL 2.8 01/23/2023 0518   VLDL 19 01/23/2023 0518   LDLCALC 50 01/23/2023 0518    Physical Exam:    Vital Signs: LMP 05/10/1998     Wt Readings from Last 3 Encounters:  01/26/23 140 lb 10.5 oz (63.8 kg)  10/20/22 135 lb 9.6 oz (61.5 kg)  04/27/22 126 lb 9.6 oz (57.4 kg)     General: 89 y.o. female in no acute distress. HEENT: Normocephalic and atraumatic. Sclera clear.  Neck: Supple. No carotid bruits. No JVD. Heart: *** RRR. Distinct S1 and S2. No murmurs, gallops, or rubs.  Lungs: No increased work of breathing. Clear to ausculation bilaterally. No wheezes, rhonchi, or rales.  Abdomen: Soft, non-distended, and non-tender to palpation.  Extremities: No lower extremity edema.  Radial and distal pedal pulses 2+ and equal bilaterally. Skin: Warm and dry. Neuro: No focal deficits. Psych: Normal affect. Responds appropriately.   Assessment:    No diagnosis found.  Plan:     Disposition: Follow up in ***   Signed, Corrin Parker, PA-C  02/06/2023 8:03 PM    [facility]

## 2023-02-09 DEATH — deceased

## 2023-02-15 ENCOUNTER — Ambulatory Visit: Payer: TRICARE For Life (TFL) | Admitting: Student
